# Patient Record
Sex: Female | Born: 1985 | Race: Black or African American | Hispanic: No | Marital: Single | Smoking: Never smoker
Health system: Southern US, Community
[De-identification: ages and names within clinical notes are randomized; demographics above are authoritative.]

## PROBLEM LIST (undated history)

## (undated) ENCOUNTER — Inpatient Hospital Stay (HOSPITAL_COMMUNITY): Payer: Self-pay

## (undated) DIAGNOSIS — F99 Mental disorder, not otherwise specified: Secondary | ICD-10-CM

## (undated) DIAGNOSIS — I10 Essential (primary) hypertension: Secondary | ICD-10-CM

## (undated) DIAGNOSIS — J189 Pneumonia, unspecified organism: Secondary | ICD-10-CM

## (undated) DIAGNOSIS — Z309 Encounter for contraceptive management, unspecified: Secondary | ICD-10-CM

## (undated) DIAGNOSIS — A749 Chlamydial infection, unspecified: Secondary | ICD-10-CM

## (undated) DIAGNOSIS — A599 Trichomoniasis, unspecified: Secondary | ICD-10-CM

## (undated) HISTORY — PX: WISDOM TOOTH EXTRACTION: SHX21

## (undated) HISTORY — PX: MOUTH SURGERY: SHX715

## (undated) HISTORY — PX: INDUCED ABORTION: SHX677

---
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
## 2005-01-16 ENCOUNTER — Emergency Department (HOSPITAL_COMMUNITY): Admission: EM | Admit: 2005-01-16 | Discharge: 2005-01-16 | Payer: Self-pay | Admitting: *Deleted

## 2005-01-22 ENCOUNTER — Ambulatory Visit: Payer: Self-pay | Admitting: Family Medicine

## 2005-03-19 ENCOUNTER — Ambulatory Visit: Payer: Self-pay | Admitting: Family Medicine

## 2005-12-08 ENCOUNTER — Ambulatory Visit: Payer: Self-pay | Admitting: Family Medicine

## 2006-02-11 ENCOUNTER — Ambulatory Visit: Payer: Self-pay | Admitting: Family Medicine

## 2006-03-11 ENCOUNTER — Emergency Department (HOSPITAL_COMMUNITY): Admission: EM | Admit: 2006-03-11 | Discharge: 2006-03-11 | Payer: Self-pay | Admitting: Emergency Medicine

## 2006-03-13 ENCOUNTER — Encounter (INDEPENDENT_AMBULATORY_CARE_PROVIDER_SITE_OTHER): Payer: Self-pay | Admitting: *Deleted

## 2006-03-14 ENCOUNTER — Emergency Department (HOSPITAL_COMMUNITY): Admission: EM | Admit: 2006-03-14 | Discharge: 2006-03-14 | Payer: Self-pay | Admitting: *Deleted

## 2006-04-08 ENCOUNTER — Encounter (INDEPENDENT_AMBULATORY_CARE_PROVIDER_SITE_OTHER): Payer: Self-pay | Admitting: *Deleted

## 2006-04-08 ENCOUNTER — Ambulatory Visit: Payer: Self-pay | Admitting: Sports Medicine

## 2006-04-08 ENCOUNTER — Telehealth (INDEPENDENT_AMBULATORY_CARE_PROVIDER_SITE_OTHER): Payer: Self-pay | Admitting: *Deleted

## 2006-04-08 LAB — CONVERTED CEMR LAB
ALT: 19 units/L (ref 0–35)
AST: 16 units/L (ref 0–37)
Albumin: 4.4 g/dL (ref 3.5–5.2)
Alkaline Phosphatase: 62 units/L (ref 39–117)
Amylase: 49 units/L (ref 0–105)
BUN: 10 mg/dL (ref 6–23)
Beta hcg, urine, semiquantitative: NEGATIVE
CO2: 24 meq/L (ref 19–32)
Calcium: 9.6 mg/dL (ref 8.4–10.5)
Chloride: 106 meq/L (ref 96–112)
Creatinine, Ser: 0.78 mg/dL (ref 0.40–1.20)
Glucose, Bld: 83 mg/dL (ref 70–99)
HCT: 39.8 % (ref 36.0–46.0)
Hemoglobin: 13.4 g/dL (ref 12.0–15.0)
Lipase: <10 units/L (ref 0–75)
MCHC: 33.7 g/dL (ref 30.0–36.0)
MCV: 91.1 fL (ref 78.0–100.0)
Platelets: 268 10*3/uL (ref 150–400)
Potassium: 4 meq/L (ref 3.5–5.3)
RBC: 4.37 M/uL (ref 3.87–5.11)
RDW: 12.6 % (ref 11.5–14.0)
Sodium: 140 meq/L (ref 135–145)
TSH: 0.524 microintl units/mL (ref 0.350–5.50)
Total Bilirubin: 0.4 mg/dL (ref 0.3–1.2)
Total Protein: 7.1 g/dL (ref 6.0–8.3)
WBC: 5.1 10*3/uL (ref 4.0–10.5)

## 2006-05-29 ENCOUNTER — Telehealth: Payer: Self-pay | Admitting: *Deleted

## 2006-05-29 ENCOUNTER — Encounter: Payer: Self-pay | Admitting: *Deleted

## 2006-06-03 ENCOUNTER — Inpatient Hospital Stay (HOSPITAL_COMMUNITY): Admission: AD | Admit: 2006-06-03 | Discharge: 2006-06-03 | Payer: Self-pay | Admitting: Obstetrics & Gynecology

## 2006-06-09 ENCOUNTER — Ambulatory Visit: Payer: Self-pay | Admitting: Physician Assistant

## 2006-06-09 ENCOUNTER — Inpatient Hospital Stay (HOSPITAL_COMMUNITY): Admission: AD | Admit: 2006-06-09 | Discharge: 2006-06-11 | Payer: Self-pay | Admitting: Obstetrics & Gynecology

## 2006-07-07 ENCOUNTER — Telehealth: Payer: Self-pay | Admitting: Family Medicine

## 2006-07-08 ENCOUNTER — Inpatient Hospital Stay (HOSPITAL_COMMUNITY): Admission: AD | Admit: 2006-07-08 | Discharge: 2006-07-08 | Payer: Self-pay | Admitting: Family Medicine

## 2006-07-14 ENCOUNTER — Telehealth (INDEPENDENT_AMBULATORY_CARE_PROVIDER_SITE_OTHER): Payer: Self-pay | Admitting: *Deleted

## 2006-07-25 ENCOUNTER — Inpatient Hospital Stay (HOSPITAL_COMMUNITY): Admission: AD | Admit: 2006-07-25 | Discharge: 2006-07-25 | Payer: Self-pay | Admitting: Obstetrics and Gynecology

## 2006-09-09 ENCOUNTER — Encounter (INDEPENDENT_AMBULATORY_CARE_PROVIDER_SITE_OTHER): Payer: Self-pay | Admitting: Family Medicine

## 2006-09-09 ENCOUNTER — Other Ambulatory Visit: Admission: RE | Admit: 2006-09-09 | Discharge: 2006-09-09 | Payer: Self-pay | Admitting: Family Medicine

## 2006-09-09 ENCOUNTER — Ambulatory Visit: Payer: Self-pay | Admitting: Family Medicine

## 2006-09-09 LAB — CONVERTED CEMR LAB
Beta hcg, urine, semiquantitative: NEGATIVE
Chlamydia, DNA Probe: NEGATIVE
GC Probe Amp, Genital: NEGATIVE
KOH Prep: NEGATIVE
Whiff Test: POSITIVE

## 2006-09-23 ENCOUNTER — Telehealth (INDEPENDENT_AMBULATORY_CARE_PROVIDER_SITE_OTHER): Payer: Self-pay | Admitting: Family Medicine

## 2006-09-30 ENCOUNTER — Telehealth (INDEPENDENT_AMBULATORY_CARE_PROVIDER_SITE_OTHER): Payer: Self-pay | Admitting: *Deleted

## 2006-10-06 ENCOUNTER — Encounter (INDEPENDENT_AMBULATORY_CARE_PROVIDER_SITE_OTHER): Payer: Self-pay | Admitting: *Deleted

## 2006-10-09 ENCOUNTER — Encounter: Payer: Self-pay | Admitting: Family Medicine

## 2006-10-09 ENCOUNTER — Telehealth: Payer: Self-pay | Admitting: *Deleted

## 2006-12-25 ENCOUNTER — Ambulatory Visit: Payer: Self-pay | Admitting: Family Medicine

## 2006-12-25 LAB — CONVERTED CEMR LAB: Beta hcg, urine, semiquantitative: NEGATIVE

## 2007-01-12 ENCOUNTER — Telehealth: Payer: Self-pay | Admitting: *Deleted

## 2007-01-13 ENCOUNTER — Ambulatory Visit: Payer: Self-pay | Admitting: Family Medicine

## 2007-01-13 LAB — CONVERTED CEMR LAB: Rapid Strep: NEGATIVE

## 2007-02-22 ENCOUNTER — Emergency Department (HOSPITAL_COMMUNITY): Admission: EM | Admit: 2007-02-22 | Discharge: 2007-02-22 | Payer: Self-pay | Admitting: Emergency Medicine

## 2007-02-22 ENCOUNTER — Telehealth: Payer: Self-pay | Admitting: *Deleted

## 2007-02-24 ENCOUNTER — Telehealth: Payer: Self-pay | Admitting: *Deleted

## 2007-02-26 ENCOUNTER — Encounter: Payer: Self-pay | Admitting: *Deleted

## 2007-03-01 ENCOUNTER — Encounter: Payer: Self-pay | Admitting: Family Medicine

## 2007-03-18 ENCOUNTER — Ambulatory Visit: Payer: Self-pay | Admitting: Family Medicine

## 2007-03-18 DIAGNOSIS — F319 Bipolar disorder, unspecified: Secondary | ICD-10-CM | POA: Insufficient documentation

## 2007-03-23 ENCOUNTER — Ambulatory Visit: Payer: Self-pay | Admitting: Family Medicine

## 2007-03-25 ENCOUNTER — Ambulatory Visit: Payer: Self-pay | Admitting: Sports Medicine

## 2007-03-31 ENCOUNTER — Ambulatory Visit: Payer: Self-pay | Admitting: Psychology

## 2007-04-14 ENCOUNTER — Ambulatory Visit: Payer: Self-pay | Admitting: Family Medicine

## 2007-07-08 ENCOUNTER — Encounter: Payer: Self-pay | Admitting: *Deleted

## 2007-07-21 ENCOUNTER — Encounter (INDEPENDENT_AMBULATORY_CARE_PROVIDER_SITE_OTHER): Payer: Self-pay | Admitting: *Deleted

## 2007-10-11 ENCOUNTER — Telehealth: Payer: Self-pay | Admitting: *Deleted

## 2007-10-13 ENCOUNTER — Ambulatory Visit: Payer: Self-pay | Admitting: Family Medicine

## 2007-10-13 LAB — CONVERTED CEMR LAB: Beta hcg, urine, semiquantitative: NEGATIVE

## 2007-10-26 ENCOUNTER — Ambulatory Visit: Payer: Self-pay | Admitting: Family Medicine

## 2007-10-26 LAB — CONVERTED CEMR LAB: Beta hcg, urine, semiquantitative: NEGATIVE

## 2007-10-27 ENCOUNTER — Telehealth: Payer: Self-pay | Admitting: *Deleted

## 2008-01-14 DIAGNOSIS — F99 Mental disorder, not otherwise specified: Secondary | ICD-10-CM

## 2008-01-14 DIAGNOSIS — F319 Bipolar disorder, unspecified: Secondary | ICD-10-CM

## 2008-01-14 HISTORY — DX: Mental disorder, not otherwise specified: F99

## 2008-01-14 HISTORY — DX: Bipolar disorder, unspecified: F31.9

## 2008-01-27 ENCOUNTER — Ambulatory Visit: Payer: Self-pay | Admitting: Family Medicine

## 2008-01-27 ENCOUNTER — Encounter: Payer: Self-pay | Admitting: Family Medicine

## 2008-01-27 DIAGNOSIS — L219 Seborrheic dermatitis, unspecified: Secondary | ICD-10-CM | POA: Insufficient documentation

## 2008-06-16 ENCOUNTER — Ambulatory Visit: Payer: Self-pay | Admitting: Family Medicine

## 2008-06-16 ENCOUNTER — Encounter: Payer: Self-pay | Admitting: Family Medicine

## 2008-06-16 ENCOUNTER — Other Ambulatory Visit: Admission: RE | Admit: 2008-06-16 | Discharge: 2008-06-16 | Payer: Self-pay | Admitting: Family Medicine

## 2008-06-16 ENCOUNTER — Encounter: Payer: Self-pay | Admitting: *Deleted

## 2008-06-16 LAB — CONVERTED CEMR LAB
Beta hcg, urine, semiquantitative: NEGATIVE
Whiff Test: POSITIVE

## 2008-06-20 LAB — CONVERTED CEMR LAB
Chlamydia, DNA Probe: POSITIVE — AB
GC Probe Amp, Genital: NEGATIVE

## 2008-07-06 ENCOUNTER — Encounter: Payer: Self-pay | Admitting: *Deleted

## 2008-07-14 ENCOUNTER — Ambulatory Visit: Payer: Self-pay | Admitting: Family Medicine

## 2008-07-14 LAB — CONVERTED CEMR LAB: Beta hcg, urine, semiquantitative: NEGATIVE

## 2008-10-27 ENCOUNTER — Emergency Department (HOSPITAL_COMMUNITY): Admission: EM | Admit: 2008-10-27 | Discharge: 2008-10-27 | Payer: Self-pay | Admitting: Emergency Medicine

## 2009-01-15 ENCOUNTER — Encounter (INDEPENDENT_AMBULATORY_CARE_PROVIDER_SITE_OTHER): Payer: Self-pay | Admitting: *Deleted

## 2009-01-15 DIAGNOSIS — F17200 Nicotine dependence, unspecified, uncomplicated: Secondary | ICD-10-CM | POA: Insufficient documentation

## 2009-06-06 ENCOUNTER — Telehealth: Payer: Self-pay | Admitting: Family Medicine

## 2009-06-06 ENCOUNTER — Ambulatory Visit: Payer: Self-pay | Admitting: Family Medicine

## 2009-06-06 LAB — CONVERTED CEMR LAB
Beta hcg, urine, semiquantitative: NEGATIVE
Bilirubin Urine: NEGATIVE
Glucose, Urine, Semiquant: NEGATIVE
Ketones, urine, test strip: NEGATIVE
Nitrite: NEGATIVE
Protein, U semiquant: 100
Specific Gravity, Urine: 1.02
Urobilinogen, UA: 0.2
WBC Urine, dipstick: NEGATIVE
pH: 7

## 2009-06-14 ENCOUNTER — Telehealth: Payer: Self-pay | Admitting: Family Medicine

## 2009-07-23 ENCOUNTER — Emergency Department (HOSPITAL_COMMUNITY): Admission: EM | Admit: 2009-07-23 | Discharge: 2009-07-23 | Payer: Self-pay | Admitting: Family Medicine

## 2009-11-09 ENCOUNTER — Emergency Department (HOSPITAL_COMMUNITY): Admission: EM | Admit: 2009-11-09 | Discharge: 2009-11-09 | Payer: Self-pay | Admitting: Family Medicine

## 2009-11-22 ENCOUNTER — Encounter: Payer: Self-pay | Admitting: Family Medicine

## 2010-02-12 NOTE — Assessment & Plan Note (Signed)
Summary: DNKA   DPG   

## 2010-02-12 NOTE — Assessment & Plan Note (Signed)
Summary: throwing up/diarrhea wp   Vital Signs:  Patient Profile:   25 Years Old Female Height:     61 inches Weight:      123.1 pounds Temp:     98.8 degrees F Pulse rate:   94 / minute BP sitting:   99 / 73  Vitals Entered By: Jone Baseman CMA (December 25, 2006 11:13 AM)                 PCP:  Luretha Murphy NP  Chief Complaint:  vomitting x 1 day.  History of Present Illness: Presents with 2 yo son for NBNB emesis x 1 day.  States son with same symptoms for 3 days then it spread to her.  She denies fever.  State no blood in vomit or stool.  States some crampy abdominal pain noted. LMP unknown missed last Depo.    Current Allergies: ! PENICILLIN      Physical Exam  General:     Well-developed,well-nourished,in no acute distress; alert,appropriate and cooperative throughout examination Head:     Normocephalic and atraumatic without obvious abnormalities. No apparent alopecia or balding. Eyes:     No corneal or conjunctival inflammation noted. EOMI. Perrla. Funduscopic exam benign, without hemorrhages, exudates or papilledema. Vision grossly normal. Ears:     External ear exam shows no significant lesions or deformities.  Otoscopic examination reveals clear canals, tympanic membranes are intact bilaterally without bulging, retraction, inflammation or discharge. Hearing is grossly normal bilaterally. Nose:     External nasal examination shows no deformity or inflammation. Nasal mucosa are pink and moist without lesions or exudates. Mouth:     Oral mucosa and oropharynx without lesions or exudates.  Teeth in good repair. Lungs:     Normal respiratory effort, chest expands symmetrically. Lungs are clear to auscultation, no crackles or wheezes. Heart:     Normal rate and regular rhythm. S1 and S2 normal without gallop, murmur, click, rub or other extra sounds. Abdomen:     Bowel sounds positive,abdomen soft and non-tender without masses, organomegaly or hernias  noted.    Impression & Recommendations:  Problem # 1:  GASTROENTERITIS WITHOUT DEHYDRATION (ICD-558.9) Assessment: Deteriorated No sxs of acute abdomen etc.  Gave specific warning signs for fever, worse abd pain keep in mind appy.  keep hydrated with gatorade.  BRAT diet explained.  upreg neagtive will catch up on Depo.   Orders: [facility]- Est Level  3 (84696)   Complete Medication List: 1)  Flagyl 500 Mg Tabs (Metronidazole) .... One tab by mouth two times a day x 1 week  Other Orders: U Preg-[facility] (81025) U Preg-[facility] (29528) Depo-Provera 150mg  (U1324)   Patient Instructions: 1)  Follow up with regular physician ( Dr. Essie Christine ) in 3-5 days to assure you are getting better. 2)  This is likely a stomach virus.  It should improve with time. 3)  Keep well hydrated with pedialyte and gatorade. 4)  Avoid milk and juice this could make it worse. 5)  Start BRAT diet 6)  Bananas 7)  Rice  8)  Applesauce 9)  Toast. 10)  If no improvement, decreased urine out put, change in behavior of blood in stool or vomit.  Report to urgent care or emergency department over the week end.      ] Laboratory Results   Urine Tests  Date/Time Received: December 25, 2006 11:56 AM  Date/Time Reported: December 25, 2006 12:06 PM     Urine HCG: negative Comments: TEST PERFORMED BY  JESSICA FLEEGER, CMA...................................................................[facility]  December 25, 2006 12:05 PM       Medication Administration  Injection # 1:    Medication: Depo-Provera 150mg     Diagnosis: CONTRACEPTIVE MANAGEMENT NOS (ICD-V25.9)    Route: IM    Site: RUOQ gluteus    Exp Date: 04/13/2009    Lot #: [id number]    Mfr: [email or web address]    Comments: Pt informed to RTC Feb 27- March 13    Patient tolerated injection without complications    Given by: Jone Baseman CMA (December 25, 2006 12:09 PM)  Orders Added: 1)  [facility]- Est Level  3 [99213] 2)  U Preg-[facility] [81025] 3)  U Preg-[facility] [81025] 4)   Depo-Provera 150mg  [J1055]

## 2010-02-12 NOTE — Miscellaneous (Signed)
Summary: PT MISSED APPT FOR TX/TS  Clinical Lists Changes called pt. both numbers are invalid. mailed letter. pt needs tx and missed appt 06-30-08 for tx .Arlyss Repress CMA,  July 06, 2008 9:29 AM

## 2010-02-12 NOTE — Assessment & Plan Note (Signed)
Summary: READ PPD/KH  Nurse Visit    Current Allergies: ! PENICILLIN   PPD Results    Date of reading: 03/25/2007    Results: < 5mm    Interpretation: negative   Orders Added: 1)  Est Level 1- [facility] [04540]    ]

## 2010-02-12 NOTE — Miscellaneous (Signed)
Summary: DNKA- Reaction to meds  Clinical Lists Changes

## 2010-02-12 NOTE — Assessment & Plan Note (Signed)
Summary: Initial MDC appointment    PCP:  Luretha Murphy NP   History of Present Illness: Patient presents with case manager Key and patient's boyfriend Aurther Loft.  Key provides the initial history.  States that she was prescribed medications (abilify and Lexapro) but did not like the side effects.  She stopped taking the medication and her behavor became erratic.  She also described substance abuse treatment and in-house treatment for this and her mood issues.  The case worker is concerned about many things including the impact the patient's mood issues have on the patient's two year old son.    Patient reports regular marijuana use up until a month ago (her case worker says a few weeks ago).  She denies the use of other substances except alcohol.  She reports she drinks socially but describes drinking to the point of intoxication about two weeks ago.  Patient reports her mother has a diagnosis of bipolar disorder with history of hospitalization.  She says her older brother has AD/HD and her younger brother is without psychiatric issues.    Patient denies decreased need for sleep.  Admits to having moods that shift regularly.  She denies suicidal ideation.  She says mostly her anger is directed toward others through yelling and screaming.  Aurther Loft denies that she is violent on a regular basis.  He reported that he does not feel unsafe with her nor does he have concerns for her safety.  Patient reported taking Abilify 10 mg at night made her sleepy during the day.  She said the Lexapro sped her up and made it difficult to go to sleep.  Educational level and work history is not well known.    Current Allergies: ! PENICILLIN        Impression & Recommendations:  Problem # 1:  BIPOLAR AFFECTIVE DISORDER (ICD-296.80) Patient seemed blunted.  She smirked occasionally - especially in response to the reports of her case worker and boyfriend.  Unclear how seriously she is taking this.  Level of  distress does not seem consistent with the negative consequences she has experienced lately - due to her mood and behavior issues.  Insight is limited.  Cognitive level is unknown but might be a factor.  Denies suicidal ideation.  Discussed the idea of being clean and sober as a necessary component to effective treatment.  She does not believe this will be a problem.  Discussed the idea that treatment is a journey and that there is not likely to be any easy answers.  She is concerned about becoming a zombie or losing her personality.    Patient report of symptoms is consistent with Bipolar Disorder with rapid cycling and / or mixed state.    Zyprexa was recommended because of how quickly it tends to work.  We discussed the need for periodic blood monitoring.  Discussed starting at 5 mg a day with an increase to 10 mg in a few days.  Discussed signs that things are getting better.  Patient reported less mood lability and decreased anger.  Patient also mentioned a desire to be able to focus more effectively.  Side effects including day time drowsiness was discussed.  Her son is not in the house right now and so daytime somnolence is less of an issue.   [phone])   Medications Added to Medication List This Visit: 1)  Zyprexa 5 Mg Tabs (Olanzapine) .... Take 5 mg a night for three nights.  after that, take 10  mg a night.  Complete Medication List: 1)  Zyprexa 5 Mg Tabs (Olanzapine) .... Take 5 mg a night for three nights.  after that, take 10 mg a night.   Patient Instructions: 1)  Take 5 mg each night for 3 nights and then increase to 10 mg at night (between 8 - 9 pm should be good to avoid daytime sleepiness). 2)  Please call [phone] with any questions or concerns.    Prescriptions: ZYPREXA 5 MG  TABS (OLANZAPINE) Take 5 mg a night for three nights.  After that, take 10 mg a night.  #30 x 0   Entered and Authorized by:   Spero Geralds PsyD   Signed by:    Spero Geralds PsyD on 03/31/2007   Method used:   Handwritten   RxID:   [id number]  ]

## 2010-02-12 NOTE — Assessment & Plan Note (Signed)
Summary: MDC follow-up    PCP:  Luretha Murphy NP   History of Present Illness: Patient reports sedation on Zyprexa that she could not tolerate.  She noticed no benefit.  She had an increased appetite on it.  Aurther Loft, her boyfriend, had the same observations.  Patient reports interest in trying a different medication as long as it is helpful.  We discussed options including Abilify without Lexapro (she was on that before with Lexapro).  She did not like that option because she thinks the Abilify caused her trouble last time.  We discussed Lithium and Depakote.    In the last two weeks, patient reports no days of noticeably depressed mood.  She reports being more "alert" to things over the last two weeks - able to better focus.      Current Allergies: ! PENICILLIN        Impression & Recommendations:  Problem # 1:  BIPOLAR AFFECTIVE DISORDER (ICD-296.80) Patient appears more alert and engaged.  Affect within normal limits today.  Thoughts are clear and goal directed.  Noticeable difference in ability to focus.    Dr. Kathrynn Running recommended Depakote with a slow titration starting at 250 mg.  Side effects were discussed includling stomach upset.  Patient is on Depo Provera for birth control and gets the shot regularly.  Patient agrees wtih treatment plan.  Orders: Therapy 20-30 min- [facility] (04540)   Complete Medication List: 1)  Depakote Er 250 Mg Tb24 (Divalproex sodium) .... Take one at night for five nights; then two at night for five nights and then three a night.  take with food.   Patient Instructions: 1)  Please schedule a [facility] appointment for:  April 15th at 2:00. 2)  For the first five days, take 250 mg of Depakote with your evening meal.  Increase to two pills (500 mg) for the next five days and then to three pills (750 mg). 3)  Please call [phone] with questions or concerns. 4)  You can go to [email or web address] if you would like further  information.    Prescriptions: DEPAKOTE ER 250 MG  TB24 (DIVALPROEX SODIUM) Take one at night for five nights; then two at night for five nights and then three a night.  Take with food.  #60 x 0   Entered and Authorized by:   Spero Geralds PsyD   Signed by:   Spero Geralds PsyD on 04/14/2007   Method used:   Handwritten   RxID:   [id number]  ]

## 2010-02-12 NOTE — Assessment & Plan Note (Signed)
Summary: STD tx,df  Nurse Visit     Allergies: 1)  ! Penicillin  Laboratory Results   Urine Tests  Date/Time Received: July 14, 2008 10:30 AM  Date/Time Reported: July 14, 2008 10:38 AM     Urine HCG: negative Comments: Jacki Cones RN  July 14, 2008 10:38 AM       Medication Administration  Injection # 1:    Medication: Depo-Provera 150mg     Diagnosis: CONTRACEPTIVE MANAGEMENT NOS (ICD-V25.9)    Route: IM    Site: LUOQ gluteus    Exp Date: 10/2010    Lot #: [id number]    Mfr: [email or web address]    Comments: Pt states she had unprotected sex last night.  Advised today's pregnancy test is not conclusive, offered emergency contraception - pt declined.  She wants depo today.  Advised her she needs to return to clinic 07/28/08 for repeat pregnancy test to ensure that she is not pregnant.  Also advised her to use condoms for next 7 days as the depo may not be fully effective during that time, and always use condoms for safer sex practices.     Patient tolerated injection without complications    Given by: Jacki Cones RN (July 14, 2008 12:16 PM)  Medication # 1:    Medication: Azithromycin oral    Diagnosis: CHLAMYDTRACHOMATIS INFECTION LOWER GU SITES (ICD-099.53)    Dose: 1 gram    Route: po    Exp Date: 03/13/2009    Lot #: [id number]    Mfr: [email or web address]    Comments: Advised pt to make sure her partner is notified and receives treatment, and to abstain from sex for at least 7 days after they both receive treatment.  Advised her to ALWAYS use condoms for safer sex practices.    Patient tolerated medication without complications    Given by: Jacki Cones RN (July 14, 2008 12:18 PM)  Orders Added: 1)  U Preg-[facility] [81025] 2)  Depo-Provera 150mg  [J1055] 3)  Azithromycin oral [Q0144] 4)  Est Level 1- [facility] [70623]       Medication Administration  Injection # 1:    Medication: Depo-Provera 150mg     Diagnosis: CONTRACEPTIVE MANAGEMENT NOS (ICD-V25.9)    Route: IM    Site: LUOQ  gluteus    Exp Date: 10/2010    Lot #: [id number]    Mfr: [email or web address]    Comments: Pt states she had unprotected sex last night.  Advised today's pregnancy test is not conclusive, offered emergency contraception - pt declined.  She wants depo today.  Advised her she needs to return to clinic 07/28/08 for repeat pregnancy test to ensure that she is not pregnant.  Also advised her to use condoms for next 7 days as the depo may not be fully effective during that time, and always use condoms for safer sex practices.     Patient tolerated injection without complications    Given by: Jacki Cones RN (July 14, 2008 12:16 PM)  Medication # 1:    Medication: Azithromycin oral    Diagnosis: CHLAMYDTRACHOMATIS INFECTION LOWER GU SITES (ICD-099.53)    Dose: 1 gram    Route: po    Exp Date: 03/13/2009    Lot #: [id number]    Mfr: [email or web address]    Comments: Advised pt to make sure her partner is notified and receives treatment, and to abstain from sex for at least 7 days after they both receive treatment.  Advised her to ALWAYS use condoms  for safer sex practices.    Patient tolerated medication without complications    Given by: Jacki Cones RN (July 14, 2008 12:18 PM)  Orders Added: 1)  U Preg-[facility] [81025] 2)  Depo-Provera 150mg  [J1055] 3)  Azithromycin oral [Q0144] 4)  Est Level 1- [facility] [10272]   Appended Document: STD tx,df Pt advised to return to clinic for next depo 09/29/08 - 10/13/08

## 2010-02-12 NOTE — Assessment & Plan Note (Signed)
Summary: resume depo/el   Vital Signs:  Patient Profile:   25 Years Old Female Height:     61 inches Weight:      120 pounds Temp:     98.3 degrees F Pulse rate:   53 / minute BP sitting:   105 / 64  Pt. in pain?   no  Vitals Entered By: Jone Baseman CMA (September 09, 2006 3:47 PM)                Chief Complaint:  resume depo.  History of Present Illness: S: Patient is a 25 y/o female G2P1010 who presents today to discuss contraception.  Patient had a TAB by [facility] & E about 6 weeks ago. Her IUD had been removed in 2/08 at [facility].  Her LMP was at the end of July (does not remember exact date) .  She is sexually active and she  has had recent unprotected intercourse.  She does not use condoms because she is allergic to latex.  She has had multiple sexually transmitted infections in the past. Patient states she cannot remember to take a pill dailyand she does not want patch because she got pregnant on it.    She has been on Depo in the past and would like to try it again. patient is also having some irregular spotting since she had the D&E 2. facial rash-  Patient has had intermittant facial redness over her nose nad cheeks that burns and sometimes scales. She has tried OTC cream such as Noxema that help some  Current Allergies: ! 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  Attends Grade 12. No smoking nor alcohol use   Risk Factors:  Tobacco use:  current  Cigarettes:  Yes -- 1/4 pack(s) per day    Physical Exam  General:     Well-developed,well-nourished,in no acute distress; alert,appropriate and cooperative throughout examination Genitalia:     Normal introitus for age, no external lesions, whitish watery vaginal discharge with fishy odor, mucosa pink and moist, no vaginal or cervical lesions, no vaginal atrophy, no friaility or hemorrhage, normal uterus size and position, no adnexal masses or tenderness Skin:     peeling skin on forehead with hypopigmented areas  erythematous nose and cheeks    Impression & Recommendations:  Problem # 1:  FACIAL RASH (ICD-782.1) Assessment: New Appears to be seborrheic dermatitis but not for sure. Refering patient to derm Orders: [facility]- Est  Level 4 (82956)   Problem # 2:  CONTRACEPTIVE MANAGEMENT NOS (ICD-V25.9) Assessment: New U preg negative will start on depo.  Patient will return in 3 months for next one Orders: Depo-Provera 150mg  (J1055) [facility]- Est  Level 4 (21308)   Problem #  3:  HIGH-RISK SEXUAL BEHAVIOR (ICD-V69.2) Assessment: Deteriorated Patient has BV infection.  Gave script for Flagyl 500 mg by mouth two times a day for 1 week.  Patient encouraged to buy lambswool condoms and use them everytime.  Orders: GC/Chlamydia-[facility] (87591/87491) Wet Prep- [facility] (04540) [facility]- Est  Level 4 (98119)   Complete Medication List: 1)  Flagyl 500 Mg Tabs (Metronidazole) .... One tab by mouth two times a day x 1 week  Other Orders: U Preg-[facility] (14782) Pap Smear-[facility] (95621-30865)     Prescriptions: FLAGYL 500 MG  TABS (METRONIDAZOLE) one tab by mouth two times a day x 1 week  #14 x 0   Entered and Authorized by:   Altamese Cabal MD   Signed by:   Altamese Cabal MD on 09/09/2006   Method used:   Handwritten   RxID:   [id number]    Medication Administration  Injection # 1:    Medication: Depo-Provera 150mg     Diagnosis: CONTRACEPTIVE MANAGEMENT NOS (ICD-V25.9)    Route: IM     Site: LUOQ gluteus    Exp Date: 11/13/2008    Lot #: [id number]    Mfr: [email or web address]    Comments: Pt informed to RTC 11.12-11.26    Patient tolerated injection without complications    Given by: Jone Baseman CMA (September 09, 2006 4:41 PM)  Orders Added: 1)  U Preg-[facility] [81025] 2)  GC/Chlamydia-[facility] [87591/87491] 3)  Wet Prep- [facility] [25366] 4)  Pap Smear-[facility] [44034-74259] 5)  Depo-Provera 150mg  [J1055] 6)  [facility]- Est  Level 4 [56387]   Laboratory Results   Urine Tests  Date/Time Recieved: September 09, 2006 4:27 PM  Date/Time Reported: September 09, 2006 4:48 PM     Urine HCG: negative Comments: ..............test performed by.................Marland KitchenJone Baseman, CMA  Date/Time Received: September 09, 2006 4:27  PM  Date/Time Reported: September 09, 2006 4:46 PM   Wet [place]/KOH Source: vag WBC/hpf 10-20 Bacteria/hpf 3+  Cocci Clue cells/hpf many  Positive whiff Yeast/hpf none KOH Negative Trichomonas/hpf none Comments ...............test performed by......Marland KitchenBonnie A. [place], [place] (ASCP)

## 2010-02-12 NOTE — Progress Notes (Signed)
Summary: triage  Phone Note Call from Patient Call back at [phone]   Caller: Patient Summary of Call: pt is having back pain and wants to come in today Initial call taken by: De Nurse,  Jun 06, 2009 11:28 AM  Follow-up for Phone Call        states she has a hx of kidney problems. did not get meds last rx'd for her vaginal problems. pain is 7/10. pain is low back on R side. work iin at [employer]. aware of wait. has not taken any otc for the pain as she does not like to take meds. message to K. Malen Gauze, Charity fundraiser. Chiropodist re: multiple dnkas. Follow-up by: Golden Circle RN,  Jun 06, 2009 11:38 AM

## 2010-02-12 NOTE — Progress Notes (Signed)
Summary: New ob  Phone Note Call from Patient Call back at [phone]   Reason for Call: Talk to Nurse Summary of Call: pt is requesting an appt as a new ob pt, she is aprox. 13 weeks Initial call taken by: ERIN LEVAN,  July 07, 2006 3:02 PM  Follow-up for Phone Call        informed pt of appts Follow-up by: Haydee Salter,  July 10, 2006 11:53 AM   APPT SCHEDULED July 16, 2006 @ 2:30 FOR LAB APPT SCHEDULED July 23, 2006 @ 2:45 WITH DR. Mannie Stabile. I HAVE NOT BEEN ABLE TO GET IN TOUCH WITH PATIENT TO GIVE THESE APPT TIMES TO HER. WILL WAIT FOR HER TO CONTACT us. HAS NO HOME # AND CALL BACK NUMBER IS A [employer]WORKERS CELL PHONE.  Terese Door, CMA

## 2010-02-12 NOTE — Progress Notes (Signed)
Summary: Referral to dermatologist  Phone Note Call from Patient Call back at [phone]   Reason for Call: Referral Summary of Call: Pt checking status of referral to a dermatologist.  She states she was told this would be done at her last appt on 8/27. Initial call taken by: Haydee Salter,  September 23, 2006 11:47 AM  Follow-up for Phone Call        Scheduled pt for appt with Dr. Elliot Gurney 10/21/06 at 4pm.  Their address is [address]  Phone call to pt to notify - no answer, will try again later.  Follow-up by: AMY MARTIN RN,  September 23, 2006 12:07 PM  Additional Follow-up for Phone Call Additional follow up Details #1::        Spoke with pt- she states she is unable to write down her appt time and date.  Mailed pt a letter with this info.  Additional Follow-up by: AMY MARTIN RN,  September 23, 2006 12:37 PM

## 2010-02-12 NOTE — Miscellaneous (Signed)
Summary: Directives and Consents  Directives and Consents   Imported By: Denny Peon LEVAN 10/09/2006 08:55:22  _____________________________________________________________________  External Attachment:    Type:   Image     Comment:   External Document

## 2010-02-12 NOTE — Letter (Signed)
Summary: Generic Letter  [facility]  [address]   [place], [place] [address]   Phone: [phone]  Fax: [phone]    01/27/2008  [facility] [address] [place], [place]  [address]  To Whom It May Concern:  Please be advised that the above-named patient has a common condition called seborrheic dermatitis. It is not contagious and she should be allowed to work at full capacity. Please call our office with questions.  Sincerely,   Myrtie Soman  MD [facility]

## 2010-02-12 NOTE — Assessment & Plan Note (Signed)
Summary: DNKA      Current Allergies: ! PENICILLIN        Complete Medication List: 1)  Depakote Er 250 Mg Tb24 (Divalproex sodium) .... Take one at night for five nights; then two at night for five nights and then three a night.  take with food.    ]

## 2010-02-12 NOTE — Assessment & Plan Note (Signed)
Summary: LOW back pain-see notes/[place]/saxon   Vital Signs:  Patient profile:   25 year old female Height:      61 inches Weight:      128 pounds BMI:     24.27 BP sitting:   102 / 78  (left arm) Cuff size:   regular  Vitals Entered By: Tessie Fass CMA (Jun 06, 2009 1:44 PM)  CC: lower back pain x 2 weeks Pain Assessment Patient in pain? yes     Location: lower back Intensity: 3   Primary Care Provider:  Luretha Murphy NP  CC:  lower back pain x 2 weeks.  History of Present Illness: Patient come in for right-sided low back pain.  Off and on for 3-4 months but worse lately.  SHarp, achey feeling.  Worse when laying down a while or moving certain ways.  Better when up and walking around.  No fevers.  No abdominal pain.  Thinks it is related to not taking medicine for BV back in June.  Was on depo-provera but last shot was July.  Having irregular menses.  Does not use protection during intercourse.  Last intercourse 1 week ago.  WAnts back on depo.  Last pap 06/2008.  No fevers.  Does endorse "cloudy" urine, strong odor, and icrease in frequency.  No pain with urination.  No vaginal discharge though is on menses.    Allergies: 1)  ! Penicillin  Physical Exam  General:  overweight, alert, NAD vitals reviewed Abdomen:  soft, nontender, nondistedned  back: TTP over right paraspinals in lumbar region. neg SLR FROM No bony tenderness   Impression & Recommendations:  Problem # 1:  LOW BACK PAIN, MILD (ICD-724.2) Assessment New LIkely MSK given exam.  No UTI.  Do not feel that BV/trich would be causing her pain.  Flexeril, heating pad, and stretching.   Her updated medication list for this problem includes:    Flexeril 5 Mg Tabs (Cyclobenzaprine hcl) .Marland Kitchen... 1 tab by mouth q 6 hrs as needed back pain or spasm  Orders: Urinalysis-[facility] (00000) [facility]- Est  Level 4 (78295)  Problem # 2:  VAGINITIS, BACTERIAL (ICD-616.10) Assessment: New  UA shows many clue cells.  Will treat for  BV with flagyl two times a day for 7 days.  Her updated medication list for this problem includes:    Metronidazole 500 Mg Tabs (Metronidazole) .Marland Kitchen..Marland Kitchen Two times a day for 7 days  Orders: [facility]- Est  Level 4 (62130)  Problem # 3:  TRICHOMONAL INFECTION (ICD-131.9) Assessment: New  Leptotrix in urine signifies trich infection.  Spoke with lab - trich seen but were not motile.  Either way, the flagyl for the BV should treat the trich.  Told her that her sexual partners should be treated.   Orders: [facility]- Est  Level 4 (86578)  Problem # 4:  CONTRACEPTIVE MANAGEMENT NOS (ICD-V25.9) Assessment: Unchanged Wanted to go back on depo but left before shot was given.  Called patient to inform her of this.  She did not answer.  LM to call back.  Due for pap in June.  Orders: U Preg-[facility] (46962)  Complete Medication List: 1)  Metronidazole 500 Mg Tabs (Metronidazole) .... Two times a day for 7 days 2)  Flexeril 5 Mg Tabs (Cyclobenzaprine hcl) .Marland Kitchen.. 1 tab by mouth q 6 hrs as needed back pain or spasm  Patient Instructions: 1)  You have bacterial vaginosis and trichomonas (trich).  Ivery Quale is a sexually transmitted disease and any sexual partners of yours  should be informed to go to their physician or the health department to get treatment.   2)  Use a heating pad, stretches, and the flexeril for your back.  3)  Schedule your next pap smear for sometime in mid to late June (after 06/22/09). Prescriptions: METRONIDAZOLE 500 MG TABS (METRONIDAZOLE) two times a day for 7 days Brand medically necessary #14 x 0   Entered and Authorized by:   Ardeen Garland  MD   Signed by:   Ardeen Garland  MD on 06/06/2009   Method used:   Print then Give to Patient   RxID:   [id number] FLEXERIL 5 MG TABS (CYCLOBENZAPRINE HCL) 1 tab by mouth q 6 hrs as needed back pain or spasm  #30 x 1   Entered and Authorized by:   Ardeen Garland  MD   Signed by:   Ardeen Garland  MD on 06/06/2009   Method used:   Print then Give to  Patient   RxID:   [id number]   Laboratory Results   Urine Tests  Date/Time Received: Jun 06, 2009 2:06 PM  Date/Time Reported: Jun 06, 2009 2:35 PM   Routine Urinalysis   Color: yellow Appearance: Clear Glucose: negative   (Normal Range: Negative) Bilirubin: negative   (Normal Range: Negative) Ketone: negative   (Normal Range: Negative) Spec. Gravity: 1.020   (Normal Range: 1.003-1.035) Blood: large   (Normal Range: Negative) pH: 7.0   (Normal Range: 5.0-8.0) Protein: 100   (Normal Range: Negative) Urobilinogen: 0.2   (Normal Range: 0-1) Nitrite: negative   (Normal Range: Negative) Leukocyte Esterace: negative   (Normal Range: Negative)  Urine Microscopic WBC/HPF: 1-5 RBC/HPF: 0-2 Bacteria/HPF: 2+ cocci Epithelial/HPF: 5-10 with mod no. of clue cells Other: leptothrix    Urine HCG: negative Comments: ...............test performed by......Marland KitchenBonnie A. [place], MLS (ASCP)cm

## 2010-02-12 NOTE — Progress Notes (Signed)
Summary: TB test  Phone Note Call from Patient Call back at [phone]   Summary of Call: Pt would like to know when she had her last TB test. Initial call taken by: Haydee Salter,  September 30, 2006 9:04 AM  Follow-up for Phone Call        [phone] WAS WRONG NUMBER AND OTHER NUMBER NO ONE ANSWERED. VIEWED PTS CHART AND NO NOTED TB TEST IN CHART Follow-up by: Lillia Pauls CMA,  September 30, 2006 12:07 PM

## 2010-02-12 NOTE — Assessment & Plan Note (Signed)
Summary: tb test/kh  Nurse Visit    Current Allergies: ! PENICILLIN   PPD Application    Vaccine Type: PPD    Site: right forearm    Mfr: [employer] P[employer]    Dose: 0.1 ml    Route: ID    Given by: Tomasa Rand    Exp. Date: 03/31/2009    Lot #: [id number] Pt  instructed to return 48-72 hours for PPD reading. Pt verbalized understanding.  Orders Added: 1)  TB Skin Test [86580] 2)  Est Level 1- [facility] [[address]]    ]

## 2010-02-12 NOTE — Assessment & Plan Note (Signed)
Summary: skin breakout/kh   Vital Signs:  Patient Profile:   25 Years Old Female Height:     61 inches Weight:      127.9 pounds Temp:     98.4 degrees F Pulse rate:   86 / minute BP sitting:   110 / 76  (left arm)  Pt. in pain?   no  Vitals Entered By: Jacki Cones RN (January 27, 2008 8:32 AM)                  PCP:  Luretha Murphy NP  Chief Complaint:  dry skin and redness on face.  History of Present Illness: 25 yo patient reports for evaluation of rash on face.  States she's had this problem all her life. Primarily consists of redness and dry skin around the nares and on the forehead. Mineral oil helps. Uses noxema for "break-outs" but not every day. Her work sends her here today to "find out what it is and see if it's contagious." Needs a letter to this end.    Current Allergies: ! PENICILLIN    Risk Factors:     Physical Exam  General:     alert, well-developed, well-nourished, and well-hydrated.   Skin:     Good turgor. Face has a moderate amount of make-up applied this morning. Mild erythema around the nares with minimal evidence of dry skin. Forehead is clear. No acne pustules or blackheads to speak of.  Psych:     Oriented X3, normally interactive, good eye contact, not anxious appearing, and not depressed appearing.      Impression & Recommendations:  Problem # 1:  SEBORRHEIC DERMATITIS (ICD-690.10) Assessment: New Nizoral cream and hydrocortisone as needed for redness. Limit drying agents and use quality facial moisturizer daily. Orders: [facility]- Est Level  3 (81191)   Complete Medication List: 1)  Depakote Er 250 Mg Tb24 (Divalproex sodium) .... Take one at night for five nights; then two at night for five nights and then three a night.  take with food. 2)  Ketoconazole 2 % Crea (Ketoconazole) .... Rub gently onto affected area once daily; disp 30 g container   Patient Instructions: 1)  You have seborrheic dermatitis. This is a common problem  that usually responds well to treatment. 2)  Use the ketoconazole cream once daily on the affected areas. 3)  Use over-the-counter hydrocortisone cream at night for redness. Stop this when the redness clears. 4)  Use neutrogena or other facial moisturizers to help with flaking. 5)  Limit products with benzoyl peroxide to only as needed to help with drying of the skin.   Prescriptions: KETOCONAZOLE 2 % CREA (KETOCONAZOLE) rub gently onto affected area once daily; disp 30 g container  #1 x 1   Entered and Authorized by:   Myrtie Soman  MD   Signed by:   Myrtie Soman  MD on 01/27/2008   Method used:   Electronically to        [facility] [address] (retail)       [address]       [place], [place]  [address]       Ph: [phone]       Fax: [phone]   RxID:   [id number]

## 2010-02-12 NOTE — Progress Notes (Signed)
Summary: wi request  Phone Note Call from Patient Call back at Home Phone [phone]   Reason for Call: Talk to Nurse Summary of Call: pt is requesting wi appt this am, sts her throat is swollen and she can barely swallow anything. Initial call taken by: ERIN LEVAN,  January 12, 2007 8:32 AM  Follow-up for Phone Call        appt made Follow-up by: Golden Circle RN,  January 12, 2007 8:39 AM         Appended Document: wi request unable to get a ride today. rescheduled for 8:30am. to drink hot fluids, take tylenol or ibuprofen.

## 2010-02-12 NOTE — Progress Notes (Signed)
Summary: WI request  Phone Note Call from Patient Call back at [phone]   Summary of Call: pt is wanting to be seen today for "not feeling good" - wouldn't elaborate Initial call taken by: Haydee Salter,  May 29, 2006 8:35 AM  Follow-up for Phone Call        pt reports no appetite, nausea,head spinning. has felt bad for 2 weeks. no fever. appointment scheduled today. Follow-up by: Theresia Lo RN,  May 29, 2006 8:42 AM

## 2010-02-12 NOTE — Progress Notes (Signed)
Summary: Dentist appt  Phone Note Call from Patient Call back at [phone]   Summary of Call: pt is needing to speak with someone about getting a letter for her dentist appt today stating what she can and cannot take Initial call taken by: Haydee Salter,  July 14, 2006 10:17 AM  Follow-up for Phone Call        letter done by saxon Follow-up by: Lillia Pauls CMA,  July 14, 2006 11:31 AM

## 2010-02-12 NOTE — Letter (Signed)
Summary: Generic Letter  [facility]  [address]   [place], [place] [address]   Phone: [phone]  Fax: [phone]    07/06/2008  [facility] [address] [place], [place]  [address]  Dear Ms. Blackstock,   Please call us. You missed your appointment with the nurse on 06-30-08. It is important, that you contact us.     Sincerely,   Arlyss Repress CMA,

## 2010-02-12 NOTE — Progress Notes (Signed)
Summary: Appts  Phone Note Call from Patient Call back at [phone]   Summary of Call: Pt wants to know if her case worker can make appts for her because she states that her case worker can explain things much better than she can. Initial call taken by: Haydee Salter,  February 24, 2007 11:01 AM  Follow-up for Phone Call        spoke with case worker and advised to sched. appt with s.saxon  Follow-up by: Arlyss Repress CMA,,  February 24, 2007 11:10 AM

## 2010-02-12 NOTE — Assessment & Plan Note (Signed)
Summary: DNKA- CPP   

## 2010-02-12 NOTE — Miscellaneous (Signed)
Summary: Tobacco Lenyx Boody  Clinical Lists Changes  Problems: Added new problem of TOBACCO Lallie Strahm (ICD-305.1) 

## 2010-02-12 NOTE — Assessment & Plan Note (Signed)
Summary: CPE/depo,df   Vital Signs:  Patient profile:   25 year old female Height:      61 inches Weight:      125 pounds BMI:     23.70 Pulse rate:   70 / minute BP sitting:   112 / 72  (left arm)  Vitals Entered By: Arlyss Repress CMA, (June 16, 2008 8:44 AM) CC: physical with pap. std check. left ankle pain. Is Patient Diabetic? No Pain Assessment Patient in pain? yes     Location: left ankle Intensity: 5   Primary Care Provider:  Luretha Murphy NP  CC:  physical with pap. std check. left ankle pain.Marland Kitchen  History of Present Illness: Breasts are often painful, large and does not have a good bra.    Would like to begin contraception, grandmother told her that not having a period on Depo Provera was not healthy.  She had a period last week.  Last Depo was in Sept 09/  She would like STD testing, recent sex with father of her child  Face and scalp rash, never picked up cream prescribed,.  Injured L ankle, sore and swelling/  Not taking meds for bipolar illness  Current Medications (verified): 1)  Ketoconazole 2 % Crea (Ketoconazole) .... Apply To Face Two Times A Day, 30 Gm Tube 2)  Metronidazole 500 Mg Tabs (Metronidazole) .... Two Times A Day For 7 Days 3)  Derma-Smoothe/fs Scalp 0.01 % Oil (Fluocinolone Acetonide) .... Apply To Scalp Twice Weekly, Keep On For A Least 4 Hours Then Shampoo With A Dandriff Shampoo, 1 Unit 4)  Aso Brace .... Aso Brace For Left Ankle, Left Anlke Sprain  Allergies (verified): 1)  ! Penicillin  Past History:  Past Medical History: Last updated: 09/09/2006 Chlamydia x 4  - last 12/07,  Condyloma - during pregnacy,  G2P1 FTSVD 4/06, TAB at 16 wks 7/08  Recurrent Proteus UTI - Txed (Rpt urine CS- neg),  trichomoniasis 12/07  Past Surgical History: Last updated: 09/09/2006 IUD Mirena - 11/13/2004, removed 3/08 TAB- D & E 7/08  Review of Systems General:  Denies sleep disorder. GI:  Denies constipation and diarrhea. GU:  Complains of  discharge; denies dysuria. MS:  left ankle pain. Derm:  Complains of rash; facila rash and scalp peeling.  Physical Exam  General:  Young, interesting pink and black wig, pleasant Ears:  External ear exam shows no significant lesions or deformities.  Otoscopic examination reveals clear canals, tympanic membranes are intact bilaterally without bulging, retraction, inflammation or discharge. Hearing is grossly normal bilaterally. Mouth:  Oral mucosa and oropharynx without lesions or exudates.  Teeth in fair repair. Neck:  No deformities, masses, or tenderness noted. Breasts:  pendulous, stretch marks on chest wall, no masses Lungs:  Normal respiratory effort, chest expands symmetrically. Lungs are clear to auscultation, no crackles or wheezes. Heart:  Normal rate and regular rhythm. S1 and S2 normal without gallop, murmur, click, rub or other extra sounds. Abdomen:  Bowel sounds positive,abdomen soft and non-tender without masses, organomegaly or hernias noted. Genitalia:  Normal introitus for age, no external lesions, white vaginal discharge, mucosa pink and moist, no vaginal or cervical lesions, no vaginal atrophy, no friaility or hemorrhage, normal uterus size and position, no adnexal masses or tenderness Msk:  Left ankle tr edema, good ROM, weight bearing witout diffiuclty Psych:  good eye contact and not anxious appearing.     Impression & Recommendations:  Problem # 1:  SCREENING FOR MALIGNANT NEOPLASM OF THE CERVIX (  ICD-V76.2)  Orders: Pap Smear-[facility] (78469-62952)  Problem # 2:  CONTACT OR EXPOSURE TO OTHER VIRAL DISEASES (ICD-V01.79)  Orders: GC/Chlamydia-[facility] (87591/87491) Wet Prep- [facility] (84132) [facility]- Est  Level 4 (44010)  Problem # 3:  SEBORRHEIC DERMATITIS (ICD-690.10)  klotrimazole to face, derma-smooth oil to salp  Orders: [facility]- Est  Level 4 (99214)  Problem # 4:  BIPOLAR AFFECTIVE DISORDER (ICD-296.80) No taking meds  Problem # 5:  CONTRACEPTIVE MANAGEMENT NOS  (ICD-V25.9) Counseled on hormonal contraception and amenorrhea is expected with Depo provera, also recommended she call [facility] for infomation on Implanon. return in 2 weeks for shot, no sex or condoms (gave her some) Orders: U Preg-[facility] (81025) [facility]- Est  Level 4 (27253)  Problem # 6:  ANKLE SPRAIN, LEFT (ICD-845.00) ASO brace and ABC exercises  Complete Medication List: 1)  Ketoconazole 2 % Crea (Ketoconazole) .... Apply to face two times a day, 30 gm tube 2)  Metronidazole 500 Mg Tabs (Metronidazole) .... Two times a day for 7 days 3)  Derma-smoothe/fs Scalp 0.01 % Oil (Fluocinolone acetonide) .... Apply to scalp twice weekly, keep on for a least 4 hours then shampoo with a dandriff shampoo, 1 unit 4)  Aso Brace  .... Aso brace for left ankle, left anlke sprain  Patient Instructions: 1)  Nurse visit in 2 weeks for Depo. If you decide to use Depo Provera-you must return in 2 weeks for a second pregnancy test and get the shot.  Please either do not have intercourse for the 2 weeks or use condoms. 2)  Call Health Dept [phone] for appointment to discuss Implanon Prescriptions: ASO BRACE ASO brace for left ankle, left anlke sprain Brand medically necessary #1 x 0   Entered and Authorized by:   Luretha Murphy NP   Signed by:   Luretha Murphy NP on 06/16/2008   Method used:   Print then Give to Patient   RxID:   [id number] DERMA-SMOOTHE/FS SCALP 0.01 % OIL (FLUOCINOLONE ACETONIDE) apply to scalp twice weekly, keep on for a least 4 hours then shampoo with a dandriff shampoo, 1 unit Brand medically necessary #1 x 3   Entered and Authorized by:   Luretha Murphy NP   Signed by:   Luretha Murphy NP on 06/16/2008   Method used:   Print then Give to Patient   RxID:   [id number] METRONIDAZOLE 500 MG TABS (METRONIDAZOLE) two times a day for 7 days Brand medically necessary #14 x 0   Entered and Authorized by:   Luretha Murphy NP   Signed by:   Luretha Murphy NP on 06/16/2008   Method used:   Print  then Give to Patient   RxID:   [id number] KETOCONAZOLE 2 % CREA (KETOCONAZOLE) apply to face two times a day, 30 GM tube Brand medically necessary #1 x 3   Entered and Authorized by:   Luretha Murphy NP   Signed by:   Luretha Murphy NP on 06/16/2008   Method used:   Print then Give to Patient   RxID:   [id number]   Laboratory Results   Urine Tests  Date/Time Received: June 16, 2008 8:51 AM  Date/Time Reported: June 16, 2008 9:09 AM     Urine HCG: negative Comments: ...........test performed by...........Marland KitchenTerese Door, CMA  Date/Time Received: June 16, 2008 8:51 AM  Date/Time Reported: June 16, 2008 9:09 AM   [facility] Source: vaginal WBC/hpf: 0-3 Bacteria/hpf: 3+  Cocci Clue cells/hpf: moderate  Positive whiff Yeast/hpf: none Trichomonas/hpf:  none Comments: ...........test performed by...........Marland KitchenTerese Door, CMA

## 2010-02-12 NOTE — Progress Notes (Signed)
Summary: meds prob  Phone Note Call from Patient Call back at [phone]   Caller: Patient Summary of Call: started taking meds and has questions about it. Initial call taken by: De Nurse,  June 14, 2009 9:25 AM  Follow-up for Phone Call        [facility] [address] states she stopped the metronidazole over the holiday weekend as she was drinking. also had sex with same man. wanted to know if she can just finish the pills. told her unlikely. will need to take every day for the full time & NOT have sex or alcohol.  the man needs to be treated at the same time. states they use condoms to pcp to see if she will send another rx Follow-up by: Golden Circle RN,  June 14, 2009 9:35 AM  Additional Follow-up for Phone Call Additional follow up Details #1::        Instructed to take the remaining 11 pills, two times a day until gone.  Instructed not to have sex with partner until they are treated.  Condoms are essential in prevention of STD. She has a follow up apt next week. Additional Follow-up by: Luretha Murphy NP,  June 14, 2009 10:42 AM

## 2010-02-12 NOTE — Progress Notes (Signed)
Summary: phone note  Phone Note Call from Patient Call back at [phone]   Caller: Patient Summary of Call: pt had depo shot yesterday and wants to know if it's supposed to be this sore.  she can hardly move her arm Initial call taken by: De Nurse,  October 27, 2007 12:09 PM  Follow-up for Phone Call        LMOVM asking pt to return call.  Yes Depo can make arm sore.  If it is that unbearable i suggest she get the injection in her hip next him.  She can also take tylenol. Follow-up by: Jone Baseman CMA,  October 27, 2007 5:29 PM  Additional Follow-up for Phone Call Additional follow up Details #1::        Ccalled to see if arm has improved.  Spoke to man who said she wasn't home.  Told we had asked her to call back and am just rechecking that she is ok.  He said he would tell her to call. Additional Follow-up by: Starleen Blue RN,  October 28, 2007 4:03 PM    Additional Follow-up for Phone Call Additional follow up Details #2::    Will await callback from pt. Follow-up by: [facility] CMA,  October 29, 2007 11:08 AM

## 2010-02-12 NOTE — Assessment & Plan Note (Signed)
Summary: vomitting, weight loss/ACM   Vital Signs:  Patient Profile:   25 Years Old Female Height:     61 inches Weight:      118.5 pounds BMI:     22.47 BSA:     1.51 Temp:     98.5 degrees F Pulse rate:   84 / minute BP sitting:   104 / 66  Pt. in pain?   no  Vitals Entered By: Golden Circle RN (April 08, 2006 11:12 AM)              Is Patient Diabetic? No   Chief Complaint:  n & v and wt loss.  History of Present Illness: Pt c/o weight loss x ?length of time Had son 2 yrs ago.  Lost 10lbs last year, thought it was "baby fat", went from 145-135lbs Prepregnancy wt 125lbs. Was 122lbs at the hosp 1 month ago, today wt at office is 118lbs.  Pt reports decreased appetite x 1 month, would have to force herself to eat Has been nauseated and vomiting intermittently over the last month, mostly yellowish bile Would have emesis2-3x/wk Whenever eat solid food would have emesis  ~5-32min later.  No assoc abd pain. No similar prior episodes Yesterday sxs seem to have gotten worse.  Tried to eat a meal and had to throw up in the middle of the meal, had only 2 episodes of emesis.  No abd pain with it.  No blood in emesis.   No hx of abd surgery No fever or chills   Had IUD removed last month b/c had an infection.  Was seen at [facility] was tested positive for chlamydia, treated at the hosp.  Had abd pain at the time which has since went away. Reports told partner to get treated, pt not sure if he has been.  LMP 03/31/06, normal Is sexually active, with partner for 7 yrs, no current contraception, does have concerns with fidelity of partner.  Is interested in the Depo.  Prior Medications: None Current Allergies: ! PENICILLIN    Risk Factors:  Tobacco use:  quit    Physical Exam  General:     Well-developed,well-nourished,in no acute distress; alert,appropriate and cooperative throughout examination Mouth:     Oral mucosa and oropharynx without lesions or exudates.  Teeth  in good repair. MMM Neck:     No deformities, masses, or tenderness noted. Lungs:     Normal respiratory effort, chest expands symmetrically. Lungs are clear to auscultation, no crackles or wheezes. Heart:     Normal rate and regular rhythm. S1 and S2 normal without gallop, murmur, click, rub or other extra sounds. Abdomen:     soft, mild epigastric tenderness, neg Murphy's, no rebound/guarding, no HSM    Impression & Recommendations:  Problem # 1:  WEIGHT LOSS (ICD-783.21) Will check Abd U/S r/o gallstones check labs  Orders: Comp Met-[facility] (1122334455) Amylase-[facility] (14782-95621) Lipase-[facility] (30865-78469) CBC-[facility] (62952) TSH-[facility] (84132-44010) Ultrasound (Ultrasound) [facility]- Est  Level 4 (27253)   Problem # 2:  VOMITING (ICD-787.03) will try reglan to help with nausea and to stimulate appetite also try prilosec ?gastritis  Orders: Comp Met-[facility] (66440-34742) Amylase-[facility] (59563-87564) Lipase-[facility] (33295-18841) CBC-[facility] (66063) Ultrasound (Ultrasound) U Preg-[facility] (81025) [facility]- Est  Level 4 (99214)   Problem # 3:  CONTRACEPTIVE MANAGEMENT (ICD-V25.09) Pt desires Depo shot, has been having unprotected sex UPT today was negative Will have pt return in 2 wks for repeat UPT, if neg will give Depo. Pt encouraged not to have sex until  gets Depo shot readdress treatment of partner for chlamydia at next visit f/u in 2 wks with PCP Orders: [facility]- Est  Level 4 (03474)    Laboratory Results   Urine Tests  Date/Time Recieved: April 08, 2006 12:10 PM  Date/Time Reported: April 08, 2006 12:14 PM     Urine HCG: negative Comments: ...................................................................DONNA [facility]  April 08, 2006 12:14 PM

## 2010-02-12 NOTE — Miscellaneous (Signed)
  Clinical Lists Changes  Problems: Removed problem of CHLAMYDTRACHOMATIS INFECTION LOWER GU SITES (ICD-099.53) Removed problem of SCREENING FOR MALIGNANT NEOPLASM OF THE CERVIX (ICD-V76.2) Removed problem of CONTACT OR EXPOSURE TO OTHER VIRAL DISEASES (ICD-V01.79) Removed problem of SCREENING, PULMONARY TUBERCULOSIS (ICD-V74.1) Removed problem of SCREENING FOR MALIGNANT NEOPLASM, CERVIX (ICD-V76.2)

## 2010-02-12 NOTE — Progress Notes (Signed)
Summary: Triage  Phone Note Call from Patient Call back at [phone]   Summary of Call: Pt wants to be seen tomorrow for a being late on something. Initial call taken by: Haydee Salter,  October 11, 2007 2:13 PM  Follow-up for Phone Call        this number does not work Follow-up by: Golden Circle RN,  October 11, 2007 2:35 PM  Additional Follow-up for Phone Call Additional follow up Details #1::        wanted to know when her last depo was. March. nurse visit today to get back on it Additional Follow-up by: Golden Circle RN,  October 12, 2007 10:08 AM

## 2010-02-12 NOTE — Assessment & Plan Note (Signed)
Summary: very sore throat   Vital Signs:  Patient Profile:   25 Years Old Female Height:     61 inches Weight:      122 pounds Temp:     98.9 degrees F Pulse rate:   90 / minute BP sitting:   104 / 70  Pt. in pain?   yes    Location:   throat @ night     Intensity:   10  Vitals Entered By: Jone Baseman CMA (January 13, 2007 8:55 AM)                  PCP:  Luretha Murphy NP  Chief Complaint:  very sore throat.  History of Present Illness: 25 yo with sore throat 3 days duration.  No fever reported.  Pt states feels like her throat is " closing off at night".  No sick contacts.  Also states nonproductive cough.  Decreased by mouth intake sec to sore throat.  No addominal pain.  Pt has heard of mono but states she has never had mono.    Current Allergies: ! PENICILLIN    Risk Factors:  Tobacco use:  quit    Physical Exam  General:     scratchy voice Head:     Normocephalic and atraumatic without obvious abnormalities. No apparent alopecia or balding. Eyes:     No corneal or conjunctival inflammation noted. EOMI. Perrla. Funduscopic exam benign, without hemorrhages, exudates or papilledema. Vision grossly normal. Ears:     External ear exam shows no significant lesions or deformities.  Otoscopic examination reveals clear canals, tympanic membranes are intact bilaterally without bulging, retraction, inflammation or discharge. Hearing is grossly normal bilaterally. Nose:     External nasal examination shows no deformity or inflammation. Nasal mucosa are pink and moist without lesions or exudates. Mouth:     Oral mucosa and oropharynx without lesions or exudates.  Teeth in good repair. Neck:     no adenopathy Lungs:     Normal respiratory effort, chest expands symmetrically. Lungs are clear to auscultation, no crackles or wheezes. Heart:     Normal rate and regular rhythm. S1 and S2 normal without gallop, murmur, click, rub or other extra sounds. Abdomen:  Bowel sounds positive,abdomen soft and non-tender without masses, organomegaly or hernias noted.  Spleen not palpable    Impression & Recommendations:  Problem # 1:  SORE THROAT (ICD-462) Assessment: Deteriorated rapid strep negative.  Likely viral laryngitis.  Symptomatic treatment.  Warning signs for stridor reviewed to report to ED if this develops.   Her updated medication list for this problem includes:    Flagyl 500 Mg Tabs (Metronidazole) ..... One tab by mouth two times a day x 1 week  Orders: Rapid Strep-[facility] (24401) [facility]- Est Level  3 (02725)   Complete Medication List: 1)  Flagyl 500 Mg Tabs (Metronidazole) .... One tab by mouth two times a day x 1 week   Patient Instructions: 1)  Follow up with regular provider Luretha Murphy) in 5-7 days to  make sure you are getting better.  2)  Your strept test was negative.  You likely have the common cold virus that is affecting your throat. 3)  Pamper it 4)  Gargle warm salt water every 4 hrs when awake. 5)  Use throat lozenges and chloraseptic spray 6)  May use tylenol as needed for pain 7)  If you have difficulty breathing report to the emergency room.      ] Laboratory  Results  Date/Time Received: January 13, 2007 9:43 AM  Date/Time Reported: January 13, 2007 9:53 AM   Other Tests  Rapid Strep: negative Comments: ...............test performed by......Marland KitchenBonnie A. [place], [place] (ASCP)
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
## 2010-02-12 NOTE — Assessment & Plan Note (Signed)
Summary: resume depo. last was in march  Nurse Visit   Complete Medication List: 1)  Depakote Er 250 Mg Tb24 (Divalproex sodium) .... Take one at night for five nights; then two at night for five nights and then three a night.  take with food.    Prior Medications: DEPAKOTE ER 250 MG  TB24 (DIVALPROEX SODIUM) Take one at night for five nights; then two at night for five nights and then three a night.  Take with food. Current Allergies: ! PENICILLIN Laboratory Results   Urine Tests  Date/Time Received: October 13, 2007 3:09 PM  Date/Time Reported: October 13, 2007 3:15 PM     Urine HCG: negative Comments: ...........test performed by...........Marland KitchenTerese Door, CMA      Pt wil return in 2 weeks for her second urine pregnancy test. PT had sexual intercourse last night. Discussed the morning after pill and pt could not wait.Bishop Limbo GOULD  October 13, 2007 3:29 PM   Orders Added: 1)  U Preg-[facility] [place].P[place]    ]

## 2010-02-12 NOTE — Assessment & Plan Note (Signed)
Summary: DEPO/KH  Nurse Visit    Prior Medications: DEPAKOTE ER 250 MG  TB24 (DIVALPROEX SODIUM) Take one at night for five nights; then two at night for five nights and then three a night.  Take with food. Current Allergies: ! PENICILLIN Laboratory Results   Urine Tests  Date/Time Received: October 26, 2007 2:27 PM  Date/Time Reported: October 26, 2007 2:54 PM     Urine HCG: negative CommentsJone Baseman CMA  October 26, 2007 2:54 PM       Medication Administration  Injection # 1:    Medication: Depo-Provera 150mg     Diagnosis: CONTRACEPTIVE MANAGEMENT NOS (ICD-V25.9)    Route: IM    Site: L deltoid    Exp Date: 05/14/2010    Lot #: [id number]    Mfr: [email or web address]    Comments: Pt sts that she has not had intercourse since last time she was here (pt was told wrong protocol last time)  Her upreg was negative today so depo given.  She will return in 2 weeks for 2nd upreg to be sure she is not pregnant.  Pt to RTC for next depo Dec 29 - Jan 12    Patient tolerated injection without complications    Given by: Jone Baseman CMA (October 26, 2007 2:48 PM)  Orders Added: 1)  U Preg-[facility] [81025] 2)  Depo-Provera 150mg  [J1055] 3)  Est Level 1- [facility] [place].Siad    ]  Medication Administration  Injection # 1:    Medication: Depo-Provera 150mg     Diagnosis: CONTRACEPTIVE MANAGEMENT NOS (ICD-V25.9)    Route: IM    Site: L deltoid    Exp Date: 05/14/2010    Lot #: [id number]    Mfr: [email or web address]    Comments: Pt sts that she has not had intercourse since last time she was here (pt was told wrong protocol last time)  Her upreg was negative today so depo given.  She will return in 2 weeks for 2nd upreg to be sure she is not pregnant.  Pt to RTC for next depo Dec 29 - Jan 12    Patient tolerated injection without complications    Given by: Jone Baseman CMA (October 26, 2007 2:48 PM)  Orders Added: 1)  U Preg-[facility] [81025] 2)  Depo-Provera 150mg  [J1055] 3)  Est Level 1- [facility]  [81191]

## 2010-02-12 NOTE — Progress Notes (Signed)
Summary: Pain med and antibiotic  Phone Note Call from Patient Call back at [phone]   Summary of Call: pt states we need to fax pain medication and antibiotic to dentist office fax [phone] Initial call taken by: Haydee Salter,  July 14, 2006 11:55 AM  Follow-up for Phone Call        letter done by susan Follow-up by: Lillia Pauls CMA,  July 14, 2006 12:22 PM    Appended Document: Pain med and antibiotic Content of letter was that most antibiotics are category B, that she is allergic to PCN and may have limited supply of Tylenol #3 for pain.  Need teeth extraction, and unplanned pregnancy.

## 2010-02-12 NOTE — Progress Notes (Signed)
Summary: wi request  Phone Note Call from Patient Call back at [phone]   Reason for Call: Talk to Nurse Summary of Call: pt is requesting wi appt, sts she has a bad odor and discharge Initial call taken by: ERIN LEVAN,  February 22, 2007 11:13 AM  Follow-up for Phone Call        pt walked in asking for appt. states she has had the symptoms x 3 days & has tried otc. offered workin at 1:30pm with possible wait. she declined & asked about urgent care. told her if we were open, they would send her back here. offered 8:30am Tuesday. she accepted this  Follow-up by: Golden Circle RN,  February 22, 2007 11:29 AM

## 2010-02-12 NOTE — Assessment & Plan Note (Signed)
Summary: DNKA ([place])    Current Allergies: ! PENICILLIN

## 2010-02-12 NOTE — Progress Notes (Signed)
  Phone Note Outgoing Call   Call placed by: Ardeen Garland, MD  Call placed to: Patient Summary of Call: Patient left before Depo provera shot was given.  Called to see if she still wanted it as that was why the upreg was done.  IF she calls back, please inform her of this and have her come back today or else make a nurse visit for depo.  Initial call taken by: Lamar Laundry, MD May 25th, 2011 3:05PM
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
## 2010-02-12 NOTE — Progress Notes (Signed)
Summary: [place] request  Phone Note Call from Patient Call back at Home Phone [phone]   Summary of Call: Pt states she just got the depo shot and she cannot stop bleeding.  She is requesting to speak with an RN about it. Initial call taken by: Haydee Salter,  October 09, 2006 12:01 PM  Follow-up for Phone Call        pt is very unhappy at continued period. wants a different form of birth control if this is going to continue. told her there was no way to counteract it now. If she wanted to change, call for appt before the return date for next depo-dec & see md about options Follow-up by: Golden Circle RN,  October 09, 2006 12:04 PM

## 2010-02-12 NOTE — Progress Notes (Signed)
Summary: [place] request  Phone Note Call from Patient Call back at Home Phone [phone]   Reason for Call: Talk to Nurse Summary of Call: pt is wanting to be seen for losing a lot of weight - was 145, now 120 Initial call taken by: Haydee Salter,  April 08, 2006 9:12 AM  Follow-up for Phone Call        pt states that she has not been able to hold down meals for approx. one month.  She states she has lost weight recently due to these symptoms.  Pt also states she had to have her Mirena IUD removed due to infection recently.  Altamese Cabal is her primary physician, but is out of town until next week. Scheduled appt today with L. Jones at 11:30. Follow-up by: AMY MARTIN RN,  April 08, 2006 10:14 AM

## 2010-02-20 ENCOUNTER — Encounter: Payer: Self-pay | Admitting: *Deleted

## 2010-03-27 LAB — GC/CHLAMYDIA PROBE AMP, GENITAL
Chlamydia, DNA Probe: NEGATIVE
GC Probe Amp, Genital: NEGATIVE

## 2010-03-27 LAB — WET PREP, GENITAL
Trich, Wet Prep: NONE SEEN
Yeast Wet Prep HPF POC: NONE SEEN

## 2010-03-27 LAB — POCT URINALYSIS DIPSTICK
Bilirubin Urine: NEGATIVE
Glucose, UA: NEGATIVE mg/dL
Ketones, ur: NEGATIVE mg/dL
Nitrite: NEGATIVE
Protein, ur: NEGATIVE mg/dL
Specific Gravity, Urine: 1.02 (ref 1.005–1.030)
Urobilinogen, UA: 0.2 mg/dL (ref 0.0–1.0)
pH: 6.5 (ref 5.0–8.0)

## 2010-03-27 LAB — POCT PREGNANCY, URINE: Preg Test, Ur: NEGATIVE

## 2010-03-31 LAB — POCT URINALYSIS DIP (DEVICE)
Bilirubin Urine: NEGATIVE
Glucose, UA: NEGATIVE mg/dL
Ketones, ur: NEGATIVE mg/dL
Nitrite: NEGATIVE
Protein, ur: NEGATIVE mg/dL
Specific Gravity, Urine: >=1.03 (ref 1.005–1.030)
Urobilinogen, UA: 0.2 mg/dL (ref 0.0–1.0)
pH: 6 (ref 5.0–8.0)

## 2010-03-31 LAB — WET PREP, GENITAL
Clue Cells Wet Prep HPF POC: NONE SEEN
Trich, Wet Prep: NONE SEEN
Yeast Wet Prep HPF POC: NONE SEEN

## 2010-03-31 LAB — GC/CHLAMYDIA PROBE AMP, GENITAL
Chlamydia, DNA Probe: NEGATIVE
GC Probe Amp, Genital: NEGATIVE

## 2010-04-16 ENCOUNTER — Ambulatory Visit: Payer: Self-pay | Admitting: Family Medicine

## 2010-04-30 ENCOUNTER — Emergency Department (HOSPITAL_COMMUNITY)
Admission: EM | Admit: 2010-04-30 | Discharge: 2010-04-30 | Disposition: A | Discharge status: Home / Self Care | Payer: Self-pay | Attending: Emergency Medicine | Admitting: Emergency Medicine

## 2010-04-30 DIAGNOSIS — IMO0002 Reserved for concepts with insufficient information to code with codable children: Secondary | ICD-10-CM | POA: Insufficient documentation

## 2010-04-30 DIAGNOSIS — F3289 Other specified depressive episodes: Secondary | ICD-10-CM | POA: Insufficient documentation

## 2010-04-30 DIAGNOSIS — Y929 Unspecified place or not applicable: Secondary | ICD-10-CM | POA: Insufficient documentation

## 2010-04-30 DIAGNOSIS — F329 Major depressive disorder, single episode, unspecified: Secondary | ICD-10-CM | POA: Insufficient documentation

## 2010-04-30 DIAGNOSIS — N898 Other specified noninflammatory disorders of vagina: Secondary | ICD-10-CM | POA: Insufficient documentation

## 2010-04-30 DIAGNOSIS — R21 Rash and other nonspecific skin eruption: Secondary | ICD-10-CM | POA: Insufficient documentation

## 2010-04-30 DIAGNOSIS — S40029A Contusion of unspecified upper arm, initial encounter: Secondary | ICD-10-CM | POA: Insufficient documentation

## 2010-04-30 LAB — WET PREP, GENITAL
Trich, Wet Prep: NONE SEEN
Yeast Wet Prep HPF POC: NONE SEEN

## 2010-04-30 LAB — URINE MICROSCOPIC-ADD ON

## 2010-04-30 LAB — URINALYSIS, ROUTINE W REFLEX MICROSCOPIC
Bilirubin Urine: NEGATIVE
Glucose, UA: NEGATIVE mg/dL
Ketones, ur: NEGATIVE mg/dL
Nitrite: NEGATIVE
Protein, ur: NEGATIVE mg/dL
Specific Gravity, Urine: 1.02 (ref 1.005–1.030)
Urobilinogen, UA: 0.2 mg/dL (ref 0.0–1.0)
pH: 6 (ref 5.0–8.0)

## 2010-04-30 LAB — POCT PREGNANCY, URINE: Preg Test, Ur: NEGATIVE

## 2010-05-01 LAB — GC/CHLAMYDIA PROBE AMP, GENITAL
Chlamydia, DNA Probe: NEGATIVE
GC Probe Amp, Genital: NEGATIVE

## 2010-05-31 NOTE — Discharge Summary (Signed)
Rachel Lloyd, [place]                ACCOUNT NO.:  [id number]   MEDICAL RECORD NO.:  [id number]           PATIENT TYPE:   LOCATION:  9158                           FACILITY:   PHYSICIAN:  Conni Elliot, M.D.DATE OF BIRTH:  [birth year]   DATE OF ADMISSION:  03/16/2004  DATE OF DISCHARGE:  03/21/2004                                 DISCHARGE SUMMARY   ADMISSION DIAGNOSES:  1.  A 34 weeks intrauterine pregnancy with preterm contractions.  2.  Group B Streptococcus positive with a penicillin allergy.  3.  History of Chlamydia, previously treated.   DISCHARGE DIAGNOSES:  1.  A 35 weeks intrauterine pregnancy.  2.  Status post five days of clindamycin and gentamicin.   DISCHARGE MEDICATION:  Prenatal vitamins one tablet p.o. q day.   HISTORY OF PRESENT ILLNESS:  The patient is a 25 year old, G1 presenting at  34 weeks and 3 days with preterm contractions.  Initial intake labs showed a  wet prep that was negative with the exception of many white blood cells.  Fetal fibronectin was positive.  GBS probe was also positive.  Initial  cervical exam:  Closed 50% and -2 very soft.   HOSPITAL COURSE:  Problem 1. A 34 weeks intrauterine pregnancy with preterm  contractions.  The patient was admitted and placed on electronic and fetal  monitoring and tachometer.  The patient did not go into labor and her  contractions eased off.  Over the course of hospitalization, the patient had  fewer and fewer contractions that were picked up on the monitor and the  patient had fewer and fewer contractions of which she was aware.  During  this hospitalization, electronic fetal monitoring remained reassuring.  At  the time of discharge, the patient was 35 weeks and 1 day, stable with  reassuring monitoring for this intrauterine pregnancy.   Problem 2. Group B Streptococcus positive with history of penicillin  allergy.  The patient was admitted.  Initial GBS probe was positive.  The  patient was given five  days of clindamycin.  GBS culture results became  available on 03/21/2004.  This culture showed no GBS growth.  By the time of  discharge, the patient was stable and had completed the course of  antibiotics without complication.   Problem 3. History of Chlamydia previously treated.  The patient was  admitted and also placed on gentamicin without complication.   CONDITION ON DISCHARGE:  Stable at 35 weeks pregnancy with reassuring fetal  monitoring.   DISCHARGE INSTRUCTIONS:  Per antenatal handout.      GSD/MEDQ  D:  03/21/2004  T:  03/21/2004  Job:  [id number]   cc:   Lawerance Sabal, MD  Fax: [phone]

## 2010-08-13 ENCOUNTER — Inpatient Hospital Stay (INDEPENDENT_AMBULATORY_CARE_PROVIDER_SITE_OTHER)
Admission: RE | Admit: 2010-08-13 | Discharge: 2010-08-13 | Disposition: A | Discharge status: Home / Self Care | Payer: Self-pay | Source: Ambulatory Visit | Attending: Family Medicine | Admitting: Family Medicine

## 2010-08-13 DIAGNOSIS — Z3201 Encounter for pregnancy test, result positive: Secondary | ICD-10-CM

## 2010-08-13 LAB — CBC
HCT: 36.1 % (ref 36.0–46.0)
Hemoglobin: 12.8 g/dL (ref 12.0–15.0)
MCH: 31.1 pg (ref 26.0–34.0)
MCHC: 35.5 g/dL (ref 30.0–36.0)
MCV: 87.8 fL (ref 78.0–100.0)
Platelets: 263 10*3/uL (ref 150–400)
RBC: 4.11 MIL/uL (ref 3.87–5.11)
RDW: 12.3 % (ref 11.5–15.5)
WBC: 7.1 10*3/uL (ref 4.0–10.5)

## 2010-08-13 LAB — POCT I-STAT, CHEM 8
BUN: 5 mg/dL — ABNORMAL LOW (ref 6–23)
Calcium, Ion: 1.18 mmol/L (ref 1.12–1.32)
Chloride: 102 mEq/L (ref 96–112)
Creatinine, Ser: 0.6 mg/dL (ref 0.50–1.10)
Glucose, Bld: 109 mg/dL — ABNORMAL HIGH (ref 70–99)
HCT: 39 % (ref 36.0–46.0)
Hemoglobin: 13.3 g/dL (ref 12.0–15.0)
Potassium: 3.7 mEq/L (ref 3.5–5.1)
Sodium: 135 mEq/L (ref 135–145)
TCO2: 21 mmol/L (ref 0–100)

## 2010-08-13 LAB — DIFFERENTIAL
Basophils Absolute: 0 10*3/uL (ref 0.0–0.1)
Basophils Relative: 0 % (ref 0–1)
Eosinophils Absolute: 0.1 10*3/uL (ref 0.0–0.7)
Eosinophils Relative: 1 % (ref 0–5)
Lymphocytes Relative: 23 % (ref 12–46)
Lymphs Abs: 1.7 10*3/uL (ref 0.7–4.0)
Monocytes Absolute: 0.5 10*3/uL (ref 0.1–1.0)
Monocytes Relative: 7 % (ref 3–12)
Neutro Abs: 4.9 10*3/uL (ref 1.7–7.7)
Neutrophils Relative %: 69 % (ref 43–77)

## 2010-08-13 LAB — POCT URINALYSIS DIP (DEVICE)
Bilirubin Urine: NEGATIVE
Glucose, UA: NEGATIVE mg/dL
Ketones, ur: 40 mg/dL — AB
Leukocytes, UA: NEGATIVE
Nitrite: NEGATIVE
Protein, ur: 30 mg/dL — AB
Specific Gravity, Urine: 1.02 (ref 1.005–1.030)
Urobilinogen, UA: 1 mg/dL (ref 0.0–1.0)
pH: 7 (ref 5.0–8.0)

## 2010-08-13 LAB — POCT PREGNANCY, URINE: Preg Test, Ur: POSITIVE

## 2010-08-16 ENCOUNTER — Inpatient Hospital Stay (HOSPITAL_COMMUNITY): Payer: Medicaid Other

## 2010-08-16 ENCOUNTER — Encounter (HOSPITAL_COMMUNITY): Payer: Self-pay

## 2010-08-16 ENCOUNTER — Inpatient Hospital Stay (HOSPITAL_COMMUNITY)
Admission: AD | Admit: 2010-08-16 | Discharge: 2010-08-16 | Disposition: A | Discharge status: Home / Self Care | Payer: Medicaid Other | Source: Ambulatory Visit | Attending: Obstetrics and Gynecology | Admitting: Obstetrics and Gynecology

## 2010-08-16 DIAGNOSIS — R109 Unspecified abdominal pain: Secondary | ICD-10-CM | POA: Insufficient documentation

## 2010-08-16 DIAGNOSIS — O210 Mild hyperemesis gravidarum: Secondary | ICD-10-CM | POA: Insufficient documentation

## 2010-08-16 DIAGNOSIS — O219 Vomiting of pregnancy, unspecified: Secondary | ICD-10-CM

## 2010-08-16 DIAGNOSIS — R52 Pain, unspecified: Secondary | ICD-10-CM

## 2010-08-16 HISTORY — DX: Mental disorder, not otherwise specified: F99

## 2010-08-16 LAB — URINALYSIS, ROUTINE W REFLEX MICROSCOPIC
Glucose, UA: NEGATIVE mg/dL
Ketones, ur: 40 mg/dL — AB
Leukocytes, UA: NEGATIVE
Nitrite: NEGATIVE
Protein, ur: NEGATIVE mg/dL
Specific Gravity, Urine: >1.03 — ABNORMAL HIGH (ref 1.005–1.030)
Urobilinogen, UA: 1 mg/dL (ref 0.0–1.0)
pH: 6 (ref 5.0–8.0)

## 2010-08-16 LAB — URINE MICROSCOPIC-ADD ON

## 2010-08-16 LAB — CBC
HCT: 35.9 % — ABNORMAL LOW (ref 36.0–46.0)
Hemoglobin: 12.6 g/dL (ref 12.0–15.0)
MCH: 31.7 pg (ref 26.0–34.0)
MCHC: 35.1 g/dL (ref 30.0–36.0)
MCV: 90.2 fL (ref 78.0–100.0)
Platelets: 246 10*3/uL (ref 150–400)
RBC: 3.98 MIL/uL (ref 3.87–5.11)
RDW: 12.3 % (ref 11.5–15.5)
WBC: 8.9 10*3/uL (ref 4.0–10.5)

## 2010-08-16 LAB — WET PREP, GENITAL
Clue Cells Wet Prep HPF POC: NONE SEEN
Trich, Wet Prep: NONE SEEN
Yeast Wet Prep HPF POC: NONE SEEN

## 2010-08-16 LAB — HCG, QUANTITATIVE, PREGNANCY: hCG, Beta Chain, Quant, S: 96856 m[IU]/mL — ABNORMAL HIGH (ref <5)

## 2010-08-16 MED ORDER — OXYCODONE-ACETAMINOPHEN 5-325 MG PO TABS
2.0000 | ORAL_TABLET | Freq: Once | ORAL | Status: AC
  Administered 2010-08-16: 2 via ORAL
  Filled 2010-08-16: qty 2

## 2010-08-16 MED ORDER — PROMETHAZINE HCL 25 MG PO TABS: 25.0000 mg | ORAL_TABLET | Freq: Four times a day (QID) | ORAL | Status: DC | PRN

## 2010-08-16 MED ORDER — DEXTROSE 5 % IN LACTATED RINGERS IV BOLUS
1000.0000 mL | Freq: Once | INTRAVENOUS | Status: AC
  Administered 2010-08-16: 1000 mL via INTRAVENOUS

## 2010-08-16 MED ORDER — ONDANSETRON 8 MG PO TBDP: 8.0000 mg | ORAL_TABLET | Freq: Three times a day (TID) | ORAL | Status: AC | PRN

## 2010-08-16 MED ORDER — NYSTATIN-TRIAMCINOLONE 100000-0.1 UNIT/GM-% EX OINT: TOPICAL_OINTMENT | Freq: Two times a day (BID) | CUTANEOUS | Status: DC

## 2010-08-16 MED ORDER — ONDANSETRON HCL 4 MG/2ML IJ SOLN
4.0000 mg | Freq: Once | INTRAMUSCULAR | Status: AC
  Administered 2010-08-16: 4 mg via INTRAVENOUS
  Filled 2010-08-16: qty 2

## 2010-08-16 NOTE — ED Provider Notes (Addendum)
History     Chief Complaint  Patient presents with  . Abdominal Pain   HPIpt presents with nausea and vomiting in pregnancy.  Pt was initially seen on 7/31 at Urgent Care and diagnosed with pregnancy.  She is seen today with RLQ pain and unable to eat since yesterday due to nausea and vomiting.  She denies UTI symptoms, spotting or bleeding, constipation or diarrhea, or vaginal discharge.  She has not taken anything for the pain or nausea or vomiting.     Past Medical History  Diagnosis Date  . Mental disorder 2010    bipolar  . Scabies exposure 2012    treated july 2012 & reexposed    Past Surgical History  Procedure Date  . Mouth surgery     as a child    Family History  Problem Relation Age of Onset  . Hypertension Father     History  Substance Use Topics  . Smoking status: Former Smoker    Types: Cigarettes  . Smokeless tobacco: Never Used  . Alcohol Use: No     quit when found out was pregnant    Allergies:  Allergies  Allergen Reactions  . Penicillins     REACTION: swelling    Prescriptions prior to admission  Medication Sig Dispense Refill  . metroNIDAZOLE (FLAGYL) 500 MG tablet 2 (two) times daily. For 7 days         ROS Physical Exam   Blood pressure 110/61, pulse 100, temperature 99 F (37.2 C), temperature source Oral, resp. rate 16, height 4' 10.5" (1.486 m), weight 128 lb 3.2 oz (58.151 kg), SpO2 99.00%.  Physical Exam  Nursing note and vitals reviewed. Constitutional: She appears well-developed and well-nourished.  HENT:  Head: Normocephalic.  Eyes: Pupils are equal, round, and reactive to light.  Neck: Normal range of motion.  Cardiovascular: Normal rate.   GI: Soft. There is tenderness. There is no rebound and no guarding.  Genitourinary: Vagina normal and uterus normal. No vaginal discharge found.  Musculoskeletal: Normal range of motion.  Skin: Skin is warm and dry.       Diagnosed with Scabies- has been treated; has some dry  scaly  patches around nipples bilaterally    MAU Course  Procedures Ultrasound showed singling living IUGS with CRL indicating a gestational age of [redacted]w[redacted]d.  There cyst measuring 2.95 x 1.82 x 2.83 cm MDM Pt felt much better with her nausea after IVF and Zofran, but still having RLQ pain and wanting something for pain- percocet 2 tabs given  Assessment and Plan  abd pain in pregnancy Nausea and vomiting in pregnancy  Arshan Jabs 08/16/2010, 12:34 PM

## 2010-08-16 NOTE — Progress Notes (Signed)
Pt states she has been having pain around the umbilical are for about one week .Also lower abdominal cramping off and on.  Had a POS UPT at Urgent care 2 days ago. Pt also has been treated for scabies last month, and has been re exposed to scabies and is now having a rash and itching, worse on both breasts.

## 2010-08-17 LAB — GC/CHLAMYDIA PROBE AMP, GENITAL
Chlamydia, DNA Probe: NEGATIVE
GC Probe Amp, Genital: NEGATIVE

## 2010-08-25 ENCOUNTER — Inpatient Hospital Stay (HOSPITAL_COMMUNITY)
Admission: AD | Admit: 2010-08-25 | Discharge: 2010-08-25 | Disposition: A | Discharge status: Home / Self Care | Payer: Medicaid Other | Source: Ambulatory Visit | Attending: Obstetrics & Gynecology | Admitting: Obstetrics & Gynecology

## 2010-08-25 DIAGNOSIS — R112 Nausea with vomiting, unspecified: Secondary | ICD-10-CM

## 2010-08-25 DIAGNOSIS — O210 Mild hyperemesis gravidarum: Secondary | ICD-10-CM | POA: Insufficient documentation

## 2010-08-25 LAB — POCT PREGNANCY, URINE: Preg Test, Ur: POSITIVE

## 2010-08-25 LAB — URINALYSIS, ROUTINE W REFLEX MICROSCOPIC
Bilirubin Urine: NEGATIVE
Glucose, UA: NEGATIVE mg/dL
Hgb urine dipstick: NEGATIVE
Ketones, ur: 15 mg/dL — AB
Leukocytes, UA: NEGATIVE
Nitrite: NEGATIVE
Protein, ur: NEGATIVE mg/dL
Specific Gravity, Urine: 1.015 (ref 1.005–1.030)
Urobilinogen, UA: 1 mg/dL (ref 0.0–1.0)
pH: 7 (ref 5.0–8.0)

## 2010-08-25 MED ORDER — ONDANSETRON HCL 4 MG/2ML IJ SOLN
4.0000 mg | Freq: Once | INTRAMUSCULAR | Status: AC
Start: 2010-08-25 — End: 2010-08-25
  Administered 2010-08-25: 4 mg via INTRAVENOUS
  Filled 2010-08-25: qty 2

## 2010-08-25 MED ORDER — LACTATED RINGERS IV SOLN
INTRAVENOUS | Status: AC
  Administered 2010-08-25: 17:00:00 via INTRAVENOUS

## 2010-08-25 NOTE — ED Provider Notes (Signed)
Chief Complaint:  Nausea and vomiting  Rachel Lloyd is  25 y.o. [phone].  No LMP recorded. Patient is pregnant..  Her pregnancy status is positive.  She presents complaining of nausea and vomiting. Onset is described as ongoing and has been present for  4 days.   Obstetrical/Gynecological History: OB History    Grav Para Term Preterm Abortions TAB SAB Ect Mult Living   3 1 1  0 1 1 0 0 0 1      Past Medical History: Past Medical History  Diagnosis Date  . Mental disorder 2010    bipolar  . Scabies exposure 2012    treated july 2012 & reexposed    Past Surgical History: Past Surgical History  Procedure Date  . Mouth surgery     as a child    Family History: Family History  Problem Relation Age of Onset  . Hypertension Father     Social History: History  Substance Use Topics  . Smoking status: Former Smoker    Types: Cigarettes  . Smokeless tobacco: Never Used  . Alcohol Use: No     quit when found out was pregnant    Allergies:  Allergies  Allergen Reactions  . Penicillins     REACTION: swelling    Prescriptions prior to admission  Medication Sig Dispense Refill  . nystatin-triamcinolone (MYCOLOG) ointment Apply topically 2 (two) times daily.  30 g  0    Review of Systems - General ROS: positive for  - nausea and vomiting for 4 days.  Throws up antiemetic  Physical Exam: HRR&R, LCTA, abd soft, non-tender   Blood pressure 102/53, pulse 89, temperature 98.7 F (37.1 C), temperature source Oral, resp. rate 18.  General: General appearance - oriented to person, place, and time and ill-appearing Focused Gynecological Exam: examination not indicated  Labs:   Imaging Studies:  US Ob Comp Less 14 Wks  08/16/2010  OBSTETRICAL ULTRASOUND: This exam was performed within a [facility]. The OB US report was generated in the AS system, and faxed to the ordering physician.   This report is also available in [employer] and in the [employer]. See AS Obstetric US report.   US Ob Transvaginal  08/16/2010  OBSTETRICAL ULTRASOUND: This exam was performed within a [facility]. The OB US report was generated in the AS system, and faxed to the ordering physician.   This report is also available in [employer] and in the [employer]. See AS Obstetric US report.     Assessment: Patient Active Problem List  Diagnoses  . BIPOLAR AFFECTIVE DISORDER  . TOBACCO USER  . SEBORRHEIC DERMATITIS    Plan: IV fluids, IV antiemetic  CRESENZO-DISHMAN,Leyland Kenna 08/25/2010,4:34 PM

## 2010-08-25 NOTE — Progress Notes (Signed)
Pt c/o vomiting for the past 4 days, and unable to keep anything down.  Plans to start care at [facility] this week.

## 2010-08-28 ENCOUNTER — Other Ambulatory Visit: Payer: Self-pay

## 2010-08-30 NOTE — ED Provider Notes (Signed)
IUP on [place].  Agree with above note.  Rachel Lloyd H. 08/30/2010 10:03 AM

## 2010-09-02 ENCOUNTER — Other Ambulatory Visit: Payer: Self-pay

## 2010-09-02 DIAGNOSIS — Z3480 Encounter for supervision of other normal pregnancy, unspecified trimester: Secondary | ICD-10-CM

## 2010-09-02 LAB — HIV ANTIBODY (ROUTINE TESTING W REFLEX): HIV: NONREACTIVE

## 2010-09-02 NOTE — Progress Notes (Signed)
Prenatal labs done today Rachel Lloyd 

## 2010-09-03 LAB — OBSTETRIC PANEL
Antibody Screen: NEGATIVE
Basophils Absolute: 0 10*3/uL (ref 0.0–0.1)
Basophils Relative: 0 % (ref 0–1)
Eosinophils Absolute: 0.1 10*3/uL (ref 0.0–0.7)
Eosinophils Relative: 2 % (ref 0–5)
HCT: 35.5 % — ABNORMAL LOW (ref 36.0–46.0)
Hemoglobin: 12.4 g/dL (ref 12.0–15.0)
Hepatitis B Surface Ag: NEGATIVE
Lymphocytes Relative: 23 % (ref 12–46)
Lymphs Abs: 1.7 10*3/uL (ref 0.7–4.0)
MCH: 31.4 pg (ref 26.0–34.0)
MCHC: 34.9 g/dL (ref 30.0–36.0)
MCV: 89.9 fL (ref 78.0–100.0)
Monocytes Absolute: 0.6 10*3/uL (ref 0.1–1.0)
Monocytes Relative: 8 % (ref 3–12)
Neutro Abs: 5 10*3/uL (ref 1.7–7.7)
Neutrophils Relative %: 67 % (ref 43–77)
Platelets: 303 10*3/uL (ref 150–400)
RBC: 3.95 MIL/uL (ref 3.87–5.11)
RDW: 12.1 % (ref 11.5–15.5)
Rh Type: POSITIVE
Rubella: 439.3 IU/mL — ABNORMAL HIGH
WBC: 7.4 10*3/uL (ref 4.0–10.5)

## 2010-09-03 LAB — SICKLE CELL SCREEN: Sickle Cell Screen: NEGATIVE

## 2010-09-04 ENCOUNTER — Encounter: Payer: Self-pay | Admitting: Family Medicine

## 2010-09-04 ENCOUNTER — Ambulatory Visit (INDEPENDENT_AMBULATORY_CARE_PROVIDER_SITE_OTHER): Payer: Self-pay | Admitting: Family Medicine

## 2010-09-04 VITALS — BP 113/77 | Temp 98.6°F | Wt 127.0 lb

## 2010-09-04 DIAGNOSIS — F319 Bipolar disorder, unspecified: Secondary | ICD-10-CM

## 2010-09-04 DIAGNOSIS — IMO0002 Reserved for concepts with insufficient information to code with codable children: Secondary | ICD-10-CM | POA: Insufficient documentation

## 2010-09-04 DIAGNOSIS — T7421XA Adult sexual abuse, confirmed, initial encounter: Secondary | ICD-10-CM

## 2010-09-04 LAB — CULTURE, OB URINE: Colony Count: 5000
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
## 2010-09-04 NOTE — Patient Instructions (Signed)
Please call [facility] today. Please call me at [phone] when you have an appointment time and date.  If you have any questions/concerns, please do not hesitate to call.

## 2010-09-17 ENCOUNTER — Ambulatory Visit (INDEPENDENT_AMBULATORY_CARE_PROVIDER_SITE_OTHER): Payer: Self-pay | Admitting: Family Medicine

## 2010-09-17 VITALS — BP 105/68 | HR 99 | Temp 98.6°F | Wt 127.0 lb

## 2010-09-17 DIAGNOSIS — R109 Unspecified abdominal pain: Secondary | ICD-10-CM

## 2010-09-17 DIAGNOSIS — N949 Unspecified condition associated with female genital organs and menstrual cycle: Secondary | ICD-10-CM

## 2010-09-17 DIAGNOSIS — L293 Anogenital pruritus, unspecified: Secondary | ICD-10-CM

## 2010-09-17 DIAGNOSIS — N760 Acute vaginitis: Secondary | ICD-10-CM

## 2010-09-17 DIAGNOSIS — N898 Other specified noninflammatory disorders of vagina: Secondary | ICD-10-CM | POA: Insufficient documentation

## 2010-09-17 DIAGNOSIS — R102 Pelvic and perineal pain: Secondary | ICD-10-CM

## 2010-09-17 LAB — POCT UA - MICROSCOPIC ONLY: WBC, Ur, HPF, POC: >20

## 2010-09-17 LAB — POCT WET PREP (WET MOUNT)
Clue Cells Wet Prep HPF POC: NEGATIVE
Trichomonas Wet Prep HPF POC: NEGATIVE
Yeast Wet Prep HPF POC: NEGATIVE

## 2010-09-17 LAB — POCT URINALYSIS DIPSTICK
Bilirubin, UA: NEGATIVE
Glucose, UA: NEGATIVE
Ketones, UA: NEGATIVE
Nitrite, UA: NEGATIVE
Spec Grav, UA: 1.02
Urobilinogen, UA: 1
pH, UA: 8.5

## 2010-09-17 MED ORDER — FLUCONAZOLE 150 MG PO TABS: 150.0000 mg | ORAL_TABLET | Freq: Once | ORAL | Status: DC

## 2010-09-17 NOTE — Assessment & Plan Note (Addendum)
Irritation and physical exam c/w candidal infection. Will give 1x fluconazole and f/u prn.

## 2010-09-17 NOTE — Progress Notes (Signed)
  Subjective:    Patient ID: Rachel Lloyd, female    DOB: [birth year], 25 y.o.   MRN: [id number]  HPI  1. Right abdominal pain. Has been present for weeks in RLQ and groin and back. Feels like cramping, keeps her awake at night. Accompanied by n/v since she found out about pregnancy. She is keeping down bland foods and phenergan is helping. Denies fevers, hematuria, dysuria, hematochezia.   2. Vaginal discharge. Has a white vaginal discharge and irritation/itching in her vaginal area for past several days. Is still sexually active, but has pain and burning with intercourse.   Has a hx of chlamydia treated years ago and BV treated recently.   3. Pregnancy. States that she cannot remember the exact events leading up to discovery of her pregnancy. She is not sure of who the father of baby is, but her boyfriend thinks it is him so the patient has decided to keep the baby.   Review of Systems See HPI. No dysuria, fevers, vaginal bleeding, LOF, rash, ulcers.     Objective:   Physical Exam  Vitals reviewed. Constitutional: She is oriented to person, place, and time. She appears well-developed and well-nourished. No distress.  HENT:  Head: Normocephalic and atraumatic.  Mouth/Throat: Oropharynx is clear and moist. No oropharyngeal exudate.  Eyes: EOM are normal.  Cardiovascular: Normal rate, regular rhythm, normal heart sounds and intact distal pulses.   No murmur heard. Pulmonary/Chest: Effort normal.  Abdominal: Soft. Bowel sounds are normal. She exhibits no distension. There is tenderness.       Mild TTP in RLQ and right flank. No rebound tenderness. Uterus palpated below umbilicus.  Genitourinary: Uterus normal. Vaginal discharge found.       Erythema and dry skin surrounding perineum and vaginal mucosa. No ulceration. Moderate white discharge noted. No cervical motion tenderness.   Musculoskeletal: She exhibits no edema.  Neurological: She is alert and oriented to person, place, and  time. No cranial nerve deficit.          Assessment & Plan:

## 2010-09-17 NOTE — Patient Instructions (Signed)
I will call you if your tests are not normal. If you have fevers, increasing pains, cannot keep down food, have vaginal bleeding then please come to clinic or go to [facility]. You may take tylenol (acetaminophen) 650mg  every 4 hours for pain. Make an appointment for your regular OB visit in next 2-3 weeks.  Pregnancy - First Trimester The first trimester is the first three months your baby is growing inside you. It is important to follow your doctor's instructions. HOME CARE  Do not smoke.   Do not drink alcohol.   Only take medicine the doctor tells you to take.   Exercise.   Eat healthy foods. Eat regular, well balanced meals.   You can have sex if there are no other problems with the pregnancy.   Things that help with vomiting (morning sickness).   Eat soda crackers before getting up in the morning.   Eat 4 to 5 small meals rather than 3 large meals.   Drink liquids between meals, not during meals.  Exams are usually done every month during the first three months.  See your doctor as often as directed.   There are tests done to make sure you and your baby are healthy. These include:   Blood tests.   Urine tests   Checking your weight and blood pressure.   PAP Test   Cultures of the cervix to make sure there is no infection.   Sometimes, you need extra vitamins and iron during pregnancy. Ask your doctor if you might need these.  GET HELP RIGHT AWAY IF:  You have a fever, by mouth of 100 F (37.8 C).   Bad smelling fluid is leaking from your vagina.   There is bleeding from the vagina.   You develop severe belly or back pain.   You vomit blood. You vomit what looks like coffee grounds.   You lose more than 2 pounds in a week.   You gain 5 pounds or more in a week.   You gain more than 2 pounds in a week AND you see swelling in your feet, ankles, or legs.   You have severe dizziness or faint.   You are around people who have [place] measles.    You are around people who have chicken pox.   You are around people who have fifth disease.   You have a headache, diarrhea, pain with peeing, or can not breath right.  Document Released: 06/18/2007 Document Re-Released: 11/02/2007 [facility] Patient Information 2011 [place], [place].

## 2010-09-17 NOTE — Assessment & Plan Note (Addendum)
Uncertain of cause. Will check UA and urine culture to rule out urinary source (although no UTI symptoms or fever), repeat GC/Ch and wet prep since patient has high-risk sexual activity, has discharge, and is a poor historian. Evaluated in [facility] weeks ago and pelvic US showed a right simple ovarian cyst otherwise no obvious etiology so unlikely that imaging would reveal new findings today. This is possibly pregnancy-related cramping. Advised to try acetaminophen prn for cramping and follow up if worsening pain, fevers, hematuria, unable to tolerate solids. Pt denies need for additional anti-emetics.

## 2010-09-18 LAB — GC/CHLAMYDIA PROBE AMP, GENITAL
Chlamydia, DNA Probe: NEGATIVE
GC Probe Amp, Genital: NEGATIVE

## 2010-09-18 LAB — CULTURE, OB URINE: Colony Count: 7000

## 2010-10-04 LAB — POCT URINALYSIS DIP (DEVICE)
Glucose, UA: NEGATIVE
Ketones, ur: NEGATIVE
Nitrite: NEGATIVE
Operator id: 235561
Protein, ur: 30 — AB
Specific Gravity, Urine: 1.025
Urobilinogen, UA: 1
pH: 6

## 2010-10-04 LAB — POCT PREGNANCY, URINE
Operator id: 235561
Preg Test, Ur: NEGATIVE

## 2010-10-04 LAB — GC/CHLAMYDIA PROBE AMP, GENITAL
Chlamydia, DNA Probe: NEGATIVE
GC Probe Amp, Genital: NEGATIVE

## 2010-10-04 LAB — WET PREP, GENITAL
Trich, Wet Prep: NONE SEEN
Yeast Wet Prep HPF POC: NONE SEEN

## 2010-10-15 ENCOUNTER — Encounter: Payer: Self-pay | Admitting: Family Medicine

## 2010-10-22 ENCOUNTER — Telehealth: Payer: Self-pay | Admitting: Family Medicine

## 2010-10-22 NOTE — Telephone Encounter (Signed)
I called this patient to schedule new OB, but the call could not go through.  I called the operator to dial the number too and still unsuccessful.  Do you mind trying to reschedule a new OB for this patient?  According to her appt on 9/4 - she is keeping the baby.  Thanks.

## 2010-10-23 NOTE — Telephone Encounter (Signed)
I attempted to call patient and number was disconnected

## 2010-10-23 NOTE — Telephone Encounter (Signed)
Attempted to call patient once again

## 2010-10-24 ENCOUNTER — Encounter: Payer: Self-pay | Admitting: Family Medicine

## 2010-10-24 NOTE — Telephone Encounter (Signed)
Attempted to call patient, phone is still disconnected

## 2010-10-24 NOTE — Telephone Encounter (Signed)
Rachel Lloyd is unavailable. Since we have made several attempts to contact patient will wait for the pt to contact our office to set appt.Rachel Lloyd [place]

## 2010-10-28 ENCOUNTER — Telehealth: Payer: Self-pay | Admitting: Family Medicine

## 2010-10-28 NOTE — Telephone Encounter (Signed)
Pt called again to see if she can get the note for tomorrow - for cleaning.

## 2010-10-28 NOTE — Telephone Encounter (Signed)
Letter printed out and set up front for pt to pick up,.

## 2010-10-28 NOTE — Telephone Encounter (Signed)
Per Dr Mauricio Po OK to give letter to Pt for teeth cleaning.  Pt has Initial OB visit sched for later this month.

## 2010-10-28 NOTE — Telephone Encounter (Signed)
I don't know anything about this letter for dental cleaning.  I sent this patient a letter to schedule an initial OB visit unless she is getting care elsewhere.  If she is still pregnant, she needs to start getting prenatal care.

## 2010-10-28 NOTE — Telephone Encounter (Signed)
Needs a letter for her dentist appointment at 9 tomorrow, stating that she is pregnant.  She wants to pick this letter up.  She just needs a call when it is ready.

## 2010-10-28 NOTE — Telephone Encounter (Signed)
When I called pt to tell her letter is ready pt states the appt is for a teeth cleaning and maybe a root canal and that the letter needs to state it is OK for that work to be done w/her being pregnant.

## 2010-10-29 LAB — GC/CHLAMYDIA PROBE AMP, GENITAL
Chlamydia, DNA Probe: NEGATIVE
GC Probe Amp, Genital: NEGATIVE

## 2010-10-29 LAB — WET PREP, GENITAL
Clue Cells Wet Prep HPF POC: NONE SEEN
Trich, Wet Prep: NONE SEEN
Yeast Wet Prep HPF POC: NONE SEEN

## 2010-10-30 LAB — WET PREP, GENITAL
Clue Cells Wet Prep HPF POC: NONE SEEN
Trich, Wet Prep: NONE SEEN
Yeast Wet Prep HPF POC: NONE SEEN

## 2010-10-30 LAB — GC/CHLAMYDIA PROBE AMP, GENITAL
Chlamydia, DNA Probe: NEGATIVE
GC Probe Amp, Genital: NEGATIVE

## 2010-10-30 LAB — ABO/RH: ABO/RH(D): A POS

## 2010-11-05 ENCOUNTER — Encounter: Payer: Self-pay | Admitting: Family Medicine

## 2010-11-05 ENCOUNTER — Ambulatory Visit (INDEPENDENT_AMBULATORY_CARE_PROVIDER_SITE_OTHER): Payer: Medicaid Other | Admitting: Family Medicine

## 2010-11-05 VITALS — BP 114/74 | Temp 98.4°F | Wt 143.7 lb

## 2010-11-05 DIAGNOSIS — N898 Other specified noninflammatory disorders of vagina: Secondary | ICD-10-CM

## 2010-11-05 DIAGNOSIS — L293 Anogenital pruritus, unspecified: Secondary | ICD-10-CM

## 2010-11-05 DIAGNOSIS — Z331 Pregnant state, incidental: Secondary | ICD-10-CM

## 2010-11-05 MED ORDER — PRENATAL RX 60-1 MG PO TABS: 1.0000 | ORAL_TABLET | Freq: Every day | ORAL | Status: DC

## 2010-11-05 MED ORDER — FLUCONAZOLE 150 MG PO TABS: 150.0000 mg | ORAL_TABLET | Freq: Once | ORAL | Status: AC

## 2010-11-05 NOTE — Patient Instructions (Signed)
Please schedule follow up appointment in 4 weeks.  Pregnancy - Second Trimester  The second trimester of pregnancy (3 to 6 months) is a period of rapid growth for you and your baby. At the end of the sixth month, your baby is about 9 inches long and weighs 1 1/2 pounds. You will begin to feel the baby move between 18 and 20 weeks of the pregnancy. This is called quickening. Weight gain is faster. A clear fluid (colostrum) may leak out of your breasts. You may feel small contractions of the womb (uterus). This is known as false labor or Braxton-Hicks contractions. This is like a practice for labor when the baby is ready to be born. Usually, the problems with morning sickness have usually passed by the end of your first trimester. Some women develop small dark blotches (called cholasma, mask of pregnancy) on their face that usually goes away after the baby is born. Exposure to the sun makes the blotches worse. Acne may also develop in some pregnant women and pregnant women who have acne, may find that it goes away. PRENATAL EXAMS  Blood work may continue to be done during prenatal exams. These tests are done to check on your health and the probable health of your baby. Blood work is used to follow your blood levels (hemoglobin). Anemia (low hemoglobin) is common during pregnancy. Iron and vitamins are given to help prevent this. You will also be checked for diabetes between 24 and 28 weeks of the pregnancy. Some of the previous blood tests may be repeated.   The size of the uterus is measured during each visit. This is to make sure that the baby is continuing to grow properly according to the dates of the pregnancy.   Your blood pressure is checked every prenatal visit. This is to make sure you are not getting toxemia.   Your urine is checked to make sure you do not have an infection, diabetes or protein in the urine.   Your weight is checked often to make sure gains are happening at the suggested rate.  This is to ensure that both you and your baby are growing normally.   Sometimes, an ultrasound is performed to confirm the proper growth and development of the baby. This is a test which bounces harmless sound waves off the baby so your caregiver can more accurately determine due dates.  Sometimes, a specialized test is done on the amniotic fluid surrounding the baby. This test is called an amniocentesis. The amniotic fluid is obtained by sticking a needle into the belly (abdomen). This is done to check the chromosomes in instances where there is a concern about possible genetic problems with the baby. It is also sometimes done near the end of pregnancy if an early delivery is required. In this case, it is done to help make sure the baby's lungs are mature enough for the baby to live outside of the womb. CHANGES OCCURING IN THE SECOND TRIMESTER OF PREGNANCY Your body goes through many changes during pregnancy. They vary from person to person. Talk to your caregiver about changes you notice that you are concerned about.  During the second trimester, you will likely have an increase in your appetite. It is normal to have cravings for certain foods. This varies from person to person and pregnancy to pregnancy.   Your lower abdomen will begin to bulge.   You may have to urinate more often because the uterus and baby are pressing on your bladder. It  is also common to get more bladder infections during pregnancy (pain with urination). You can help this by drinking lots of fluids and emptying your bladder before and after intercourse.   You may begin to get stretch marks on your hips, abdomen, and breasts. These are normal changes in the body during pregnancy. There are no exercises or medications to take that prevent this change.   You may begin to develop swollen and bulging veins (varicose veins) in your legs. Wearing support hose, elevating your feet for 15 minutes, 3 to 4 times a day and limiting salt  in your diet helps lessen the problem.   Heartburn may develop as the uterus grows and pushes up against the stomach. Antacids recommended by your caregiver helps with this problem. Also, eating smaller meals 4 to 5 times a day helps.   Constipation can be treated with a stool softener or adding bulk to your diet. Drinking lots of fluids, vegetables, fruits, and whole grains are helpful.   Exercising is also helpful. If you have been very active up until your pregnancy, most of these activities can be continued during your pregnancy. If you have been less active, it is helpful to start an exercise program such as walking.   Hemorrhoids (varicose veins in the rectum) may develop at the end of the second trimester. Warm sitz baths and hemorrhoid cream recommended by your caregiver helps hemorrhoid problems.   Backaches may develop during this time of your pregnancy. Avoid heavy lifting, wear low heal shoes and practice good posture to help with backache problems.   Some pregnant women develop tingling and numbness of their hand and fingers because of swelling and tightening of ligaments in the wrist (carpel tunnel syndrome). This goes away after the baby is born.   As your breasts enlarge, you may have to get a bigger bra. Get a comfortable, cotton, support bra. Do not get a nursing bra until the last month of the pregnancy if you will be nursing the baby.   You may get a dark line from your belly button to the pubic area called the linea nigra.   You may develop rosy cheeks because of increase blood flow to the face.   You may develop spider looking lines of the face, neck, arms and chest. These go away after the baby is born.  HOME CARE INSTRUCTIONS   It is extremely important to avoid all smoking, herbs, alcohol, and unprescribed drugs during your pregnancy. These chemicals affect the formation and growth of the baby. Avoid these chemicals throughout the pregnancy to ensure the delivery of a  healthy infant.   Most of your home care instructions are the same as suggested for the first trimester of your pregnancy. Keep your caregiver's appointments. Follow your caregiver's instructions regarding medication use, exercise and diet.   During pregnancy, you are providing food for you and your baby. Continue to eat regular, well-balanced meals. Choose foods such as meat, fish, milk and other low fat dairy products, vegetables, fruits, and whole-grain breads and cereals. Your caregiver will tell you of the ideal weight gain.   A physical sexual relationship may be continued up until near the end of pregnancy if there are no other problems. Problems could include early (premature) leaking of amniotic fluid from the membranes, vaginal bleeding, abdominal pain, or other medical or pregnancy problems.   Exercise regularly if there are no restrictions. Check with your caregiver if you are unsure of the safety of some of  your exercises. The greatest weight gain will occur in the last 2 trimesters of pregnancy. Exercise will help you:   Control your weight.   Get you in shape for labor and delivery.   Lose weight after you have the baby.   Wear a good support or jogging bra for breast tenderness during pregnancy. This may help if worn during sleep. Pads or tissues may be used in the bra if you are leaking colostrum.   Do not use hot tubs, steam rooms or saunas throughout the pregnancy.   Wear your seat belt at all times when driving. This protects you and your baby if you are in an accident.   Avoid raw meat, uncooked cheese, cat litter boxes and soil used by cats. These carry germs that can cause birth defects in the baby.   The second trimester is also a good time to visit your dentist for your dental health if this has not been done yet. Getting your teeth cleaned is OK. Use a soft toothbrush. Brush gently during pregnancy.   It is easier to loose urine during pregnancy. Tightening up and  strengthening the pelvic muscles will help with this problem. Practice stopping your urination while you are going to the bathroom. These are the same muscles you need to strengthen. It is also the muscles you would use as if you were trying to stop from passing gas. You can practice tightening these muscles up 10 times a set and repeating this about 3 times per day. Once you know what muscles to tighten up, do not perform these exercises during urination. It is more likely to contribute to an infection by backing up the urine.   Ask for help if you have financial, counseling or nutritional needs during pregnancy. Your caregiver will be able to offer counseling for these needs as well as refer you for other special needs.   Your skin may become oily. If so, wash your face with mild soap, use non-greasy moisturizer and oil or cream based makeup.  MEDICATIONS AND DRUG USE IN PREGNANCY  Take prenatal vitamins as directed. The vitamin should contain 1 milligram of folic acid. Keep all vitamins out of reach of children. Only a couple vitamins or tablets containing iron may be fatal to a baby or young child when ingested.   Avoid use of all medications, including herbs, over-the-counter medications, not prescribed or suggested by your caregiver. Only take over-the-counter or prescription medicines for pain, discomfort, or fever as directed by your caregiver. Do not use aspirin.   Let your caregiver also know about herbs you may be using.   Alcohol is related to a number of birth defects. This includes fetal alcohol syndrome. All alcohol, in any form, should be avoided completely. Smoking will cause low birth rate and premature babies.   Street or illegal drugs are very harmful to the baby. They are absolutely forbidden. A baby born to an addicted mother will be addicted at birth. The baby will go through the same withdrawal an adult does.  SEEK MEDICAL CARE IF:  You have any concerns or worries during  your pregnancy. It is better to call with your questions if you feel they cannot wait, rather than worry about them. SEEK IMMEDIATE MEDICAL CARE IF:   An unexplained oral temperature above 102 F (38.9 C) develops, or as your caregiver suggests.   You have leaking of fluid from the vagina (birth canal). If leaking membranes are suspected, take your temperature and tell  your caregiver of this when you call.   There is vaginal spotting, bleeding, or passing clots. Tell your caregiver of the amount and how many pads are used. Light spotting in pregnancy is common, especially following intercourse.   You develop a bad smelling vaginal discharge with a change in the color from clear to white.   You continue to feel sick to your stomach (nauseated) and have no relief from remedies suggested. You vomit blood or coffee ground-like materials.   You lose more than 2 pounds of weight or gain more than 2 pounds of weight over 1 week, or as suggested by your caregiver.   You notice swelling of your face, hands, feet, or legs.   You get exposed to [place] measles and have never had them.   You are exposed to fifth disease or chickenpox.   You develop belly (abdominal) pain. Round ligament discomfort is a common non-cancerous (benign) cause of abdominal pain in pregnancy. Your caregiver still must evaluate you.   You develop a bad headache that does not go away.   You develop fever, diarrhea, pain with urination, or shortness of breath.   You develop visual problems, blurry, or double vision.   You fall or are in a car accident or any kind of trauma.   There is mental or physical violence at home.  Document Released: 12/24/2000 Document Revised: 09/11/2010 Document Reviewed: 06/28/2008 [facility] Patient Information 2012 [place], [place].

## 2010-11-05 NOTE — Progress Notes (Signed)
   Subjective:    Rachel Lloyd is a 25 y.o. female being seen today for her obstetrical visit. She is at [redacted]w[redacted]d gestation. Patient reports no complaints. Fetal movement: normal.  Menstrual History: OB History    Grav Para Term Preterm Abortions TAB SAB Ect Mult Living   3 1 1  0 1 1 0 0 0 1       No LMP recorded. Patient is pregnant. Patient does not remember LMP - she has a hx of irregular periods.    The following portions of the patient's history were reviewed and updated as appropriate: allergies, current medications, past family history, past medical history and problem list.  Review of Systems Pertinent items are noted in HPI.   Objective:     BP 114/74  Temp 98.4 F (36.9 C)  Wt 143 lb 11.2 oz (65.182 kg)   General: in NAD CVASC: RRR. No M RESP: CTAB ABDO: soft, gravid, NT, ND, +BS MSK: no edema, full ROM FHT: 160s FH: 21  Assessment:    Pregnancy 18 and 1/7 weeks   Plan:    Problem list reviewed and updated.  Labs reviewed. PHQ score: 8 PMH form completed, concerning for bipolar disorder (off meds) and late to [facility]. Quad screen discussed: ordered. Anatomy US: ordered. Patient to schedule lab visit for early 1 hr GTT (mother recently diagnosed with DM). Kick counts and preterm labor precautions reviewed. Amniocentesis discussed: not indicated. Patient to follow up in 4 weeks with me and lab visit for GTT.

## 2010-11-11 ENCOUNTER — Ambulatory Visit (HOSPITAL_COMMUNITY)
Admission: RE | Admit: 2010-11-11 | Discharge: 2010-11-11 | Disposition: A | Discharge status: Home / Self Care | Payer: Medicaid Other | Source: Ambulatory Visit | Attending: Family Medicine | Admitting: Family Medicine

## 2010-11-11 DIAGNOSIS — Z331 Pregnant state, incidental: Secondary | ICD-10-CM

## 2010-11-11 DIAGNOSIS — O358XX0 Maternal care for other (suspected) fetal abnormality and damage, not applicable or unspecified: Secondary | ICD-10-CM | POA: Insufficient documentation

## 2010-11-11 DIAGNOSIS — Z1389 Encounter for screening for other disorder: Secondary | ICD-10-CM | POA: Insufficient documentation

## 2010-11-11 DIAGNOSIS — Z363 Encounter for antenatal screening for malformations: Secondary | ICD-10-CM | POA: Insufficient documentation

## 2010-11-12 ENCOUNTER — Other Ambulatory Visit: Payer: Medicaid Other

## 2010-11-12 ENCOUNTER — Other Ambulatory Visit: Payer: Self-pay | Admitting: Family Medicine

## 2010-11-12 LAB — GLUCOSE, CAPILLARY: Glucose-Capillary: 119 mg/dL — ABNORMAL HIGH (ref 70–99)

## 2010-11-12 NOTE — Progress Notes (Signed)
1 HR GLUCOSE = 119 MG/DL BAJORDAN, MLS

## 2010-11-14 NOTE — Progress Notes (Signed)
Note reviewed.  Rachel Lloyd

## 2010-11-18 NOTE — ED Provider Notes (Signed)
Agree with above note.  Rachel Lloyd 11/18/2010 11:06 AM

## 2010-11-28 ENCOUNTER — Telehealth: Payer: Self-pay | Admitting: Family Medicine

## 2010-11-28 NOTE — Telephone Encounter (Signed)
5 mo OB having sharp pains in lower abd - since last night

## 2010-11-28 NOTE — Telephone Encounter (Signed)
Spoke with patient and she states she is now sitting in tub of warm water and discomfort is better, she rates pain as 3 out of 10. Pain was worse when she first called.  Having cramping. No bleeding. Advised her to go to [facility] for evaluation just to be sure.

## 2010-12-03 ENCOUNTER — Ambulatory Visit (INDEPENDENT_AMBULATORY_CARE_PROVIDER_SITE_OTHER): Payer: Medicaid Other | Admitting: Family Medicine

## 2010-12-03 VITALS — BP 113/72 | Temp 99.2°F | Wt 148.6 lb

## 2010-12-03 DIAGNOSIS — Z3480 Encounter for supervision of other normal pregnancy, unspecified trimester: Secondary | ICD-10-CM

## 2010-12-03 DIAGNOSIS — N760 Acute vaginitis: Secondary | ICD-10-CM

## 2010-12-03 DIAGNOSIS — Z3490 Encounter for supervision of normal pregnancy, unspecified, unspecified trimester: Secondary | ICD-10-CM

## 2010-12-03 LAB — POCT WET PREP (WET MOUNT)
Clue Cells Wet Prep HPF POC: NEGATIVE
Trichomonas Wet Prep HPF POC: NEGATIVE
Yeast Wet Prep HPF POC: NEGATIVE

## 2010-12-03 MED ORDER — ACETAMINOPHEN 500 MG PO TABS: 500.0000 mg | ORAL_TABLET | Freq: Four times a day (QID) | ORAL | Status: AC | PRN

## 2010-12-03 MED ORDER — TRIAMCINOLONE ACETONIDE 0.025 % EX OINT: TOPICAL_OINTMENT | Freq: Two times a day (BID) | CUTANEOUS | Status: DC

## 2010-12-03 NOTE — Progress Notes (Signed)
Patient complains of low back pain and vaginal discharge. Low back pain is intermittent and usually occurs after lying down for a long time.  She has not taken anything for it because she did not know what was safe in pregnancy.  Vaginal discharge is thick and white, describes it as "egg" consistency.  Denies any dysuria, burning with urination, vaginal itching, vaginal bleeding.  No fevers, chills, NS, vomiting or nausea.  Still eating well, no change in appetite.  Also complains of dry skin and rash on left hand between fourth and fifth fingers that is itchy.    Physical exam: Skin: red, dry lesion on left hand between 4th and 5th fingers GU: white, thick vaginal discharge, normal cervix, no tenderness or erythema noted  A/P: Will order wet prep, GC/Chlamydia today. Wrote Rx for Tylenol 500 mg  PRN and recommended heating pads, sleeping on side from now on. For rash, will give Rx for triamcinolone.  Does not appear to be scabies, likely contact dermatitis. Reviewed preterm labor precautions and kick counts. Plans to breast-feed and use Depo for [facility]. Follow up in 4 weeks w/ Dr. Mauricio Po or Dr. [place] in [facility] clinic.

## 2010-12-03 NOTE — Patient Instructions (Addendum)
It was nice to see you today.  I will call or send you a letter with lab results. Please schedule a follow up appointment with [facility] (Dr. [place] or Dr. Mauricio Po) in 4 weeks. If they are not available, please schedule appointment with Dr. Tye Savoy. For back pain, you may take Tylenol and use heating pads three times per day.  Pregnancy - Second Trimester The second trimester of pregnancy (3 to 6 months) is a period of rapid growth for you and your baby. At the end of the sixth month, your baby is about 9 inches long and weighs 1 1/2 pounds. You will begin to feel the baby move between 18 and 20 weeks of the pregnancy. This is called quickening. Weight gain is faster. A clear fluid (colostrum) may leak out of your breasts. You may feel small contractions of the womb (uterus). This is known as false labor or Braxton-Hicks contractions. This is like a practice for labor when the baby is ready to be born. Usually, the problems with morning sickness have usually passed by the end of your first trimester. Some women develop small dark blotches (called cholasma, mask of pregnancy) on their face that usually goes away after the baby is born. Exposure to the sun makes the blotches worse. Acne may also develop in some pregnant women and pregnant women who have acne, may find that it goes away. PRENATAL EXAMS  Blood work may continue to be done during prenatal exams. These tests are done to check on your health and the probable health of your baby. Blood work is used to follow your blood levels (hemoglobin). Anemia (low hemoglobin) is common during pregnancy. Iron and vitamins are given to help prevent this. You will also be checked for diabetes between 24 and 28 weeks of the pregnancy. Some of the previous blood tests may be repeated.   The size of the uterus is measured during each visit. This is to make sure that the baby is continuing to grow properly according to the dates of the pregnancy.   Your blood  pressure is checked every prenatal visit. This is to make sure you are not getting toxemia.   Your urine is checked to make sure you do not have an infection, diabetes or protein in the urine.   Your weight is checked often to make sure gains are happening at the suggested rate. This is to ensure that both you and your baby are growing normally.   Sometimes, an ultrasound is performed to confirm the proper growth and development of the baby. This is a test which bounces harmless sound waves off the baby so your caregiver can more accurately determine due dates.  Sometimes, a specialized test is done on the amniotic fluid surrounding the baby. This test is called an amniocentesis. The amniotic fluid is obtained by sticking a needle into the belly (abdomen). This is done to check the chromosomes in instances where there is a concern about possible genetic problems with the baby. It is also sometimes done near the end of pregnancy if an early delivery is required. In this case, it is done to help make sure the baby's lungs are mature enough for the baby to live outside of the womb. CHANGES OCCURING IN THE SECOND TRIMESTER OF PREGNANCY Your body goes through many changes during pregnancy. They vary from person to person. Talk to your caregiver about changes you notice that you are concerned about.  During the second trimester, you will  likely have an increase in your appetite. It is normal to have cravings for certain foods. This varies from person to person and pregnancy to pregnancy.   Your lower abdomen will begin to bulge.   You may have to urinate more often because the uterus and baby are pressing on your bladder. It is also common to get more bladder infections during pregnancy (pain with urination). You can help this by drinking lots of fluids and emptying your bladder before and after intercourse.   You may begin to get stretch marks on your hips, abdomen, and breasts. These are normal changes  in the body during pregnancy. There are no exercises or medications to take that prevent this change.   You may begin to develop swollen and bulging veins (varicose veins) in your legs. Wearing support hose, elevating your feet for 15 minutes, 3 to 4 times a day and limiting salt in your diet helps lessen the problem.   Heartburn may develop as the uterus grows and pushes up against the stomach. Antacids recommended by your caregiver helps with this problem. Also, eating smaller meals 4 to 5 times a day helps.   Constipation can be treated with a stool softener or adding bulk to your diet. Drinking lots of fluids, vegetables, fruits, and whole grains are helpful.   Exercising is also helpful. If you have been very active up until your pregnancy, most of these activities can be continued during your pregnancy. If you have been less active, it is helpful to start an exercise program such as walking.   Hemorrhoids (varicose veins in the rectum) may develop at the end of the second trimester. Warm sitz baths and hemorrhoid cream recommended by your caregiver helps hemorrhoid problems.   Backaches may develop during this time of your pregnancy. Avoid heavy lifting, wear low heal shoes and practice good posture to help with backache problems.   Some pregnant women develop tingling and numbness of their hand and fingers because of swelling and tightening of ligaments in the wrist (carpel tunnel syndrome). This goes away after the baby is born.   As your breasts enlarge, you may have to get a bigger bra. Get a comfortable, cotton, support bra. Do not get a nursing bra until the last month of the pregnancy if you will be nursing the baby.   You may get a dark line from your belly button to the pubic area called the linea nigra.   You may develop rosy cheeks because of increase blood flow to the face.   You may develop spider looking lines of the face, neck, arms and chest. These go away after the baby  is born.  HOME CARE INSTRUCTIONS   It is extremely important to avoid all smoking, herbs, alcohol, and unprescribed drugs during your pregnancy. These chemicals affect the formation and growth of the baby. Avoid these chemicals throughout the pregnancy to ensure the delivery of a healthy infant.   Most of your home care instructions are the same as suggested for the first trimester of your pregnancy. Keep your caregiver's appointments. Follow your caregiver's instructions regarding medication use, exercise and diet.   During pregnancy, you are providing food for you and your baby. Continue to eat regular, well-balanced meals. Choose foods such as meat, fish, milk and other low fat dairy products, vegetables, fruits, and whole-grain breads and cereals. Your caregiver will tell you of the ideal weight gain.   A physical sexual relationship may be continued up until near  the end of pregnancy if there are no other problems. Problems could include early (premature) leaking of amniotic fluid from the membranes, vaginal bleeding, abdominal pain, or other medical or pregnancy problems.   Exercise regularly if there are no restrictions. Check with your caregiver if you are unsure of the safety of some of your exercises. The greatest weight gain will occur in the last 2 trimesters of pregnancy. Exercise will help you:   Control your weight.   Get you in shape for labor and delivery.   Lose weight after you have the baby.   Wear a good support or jogging bra for breast tenderness during pregnancy. This may help if worn during sleep. Pads or tissues may be used in the bra if you are leaking colostrum.   Do not use hot tubs, steam rooms or saunas throughout the pregnancy.   Wear your seat belt at all times when driving. This protects you and your baby if you are in an accident.   Avoid raw meat, uncooked cheese, cat litter boxes and soil used by cats. These carry germs that can cause birth defects in the  baby.   The second trimester is also a good time to visit your dentist for your dental health if this has not been done yet. Getting your teeth cleaned is OK. Use a soft toothbrush. Brush gently during pregnancy.   It is easier to loose urine during pregnancy. Tightening up and strengthening the pelvic muscles will help with this problem. Practice stopping your urination while you are going to the bathroom. These are the same muscles you need to strengthen. It is also the muscles you would use as if you were trying to stop from passing gas. You can practice tightening these muscles up 10 times a set and repeating this about 3 times per day. Once you know what muscles to tighten up, do not perform these exercises during urination. It is more likely to contribute to an infection by backing up the urine.   Ask for help if you have financial, counseling or nutritional needs during pregnancy. Your caregiver will be able to offer counseling for these needs as well as refer you for other special needs.   Your skin may become oily. If so, wash your face with mild soap, use non-greasy moisturizer and oil or cream based makeup.  MEDICATIONS AND DRUG USE IN PREGNANCY  Take prenatal vitamins as directed. The vitamin should contain 1 milligram of folic acid. Keep all vitamins out of reach of children. Only a couple vitamins or tablets containing iron may be fatal to a baby or young child when ingested.   Avoid use of all medications, including herbs, over-the-counter medications, not prescribed or suggested by your caregiver. Only take over-the-counter or prescription medicines for pain, discomfort, or fever as directed by your caregiver. Do not use aspirin.   Let your caregiver also know about herbs you may be using.   Alcohol is related to a number of birth defects. This includes fetal alcohol syndrome. All alcohol, in any form, should be avoided completely. Smoking will cause low birth rate and premature  babies.   Street or illegal drugs are very harmful to the baby. They are absolutely forbidden. A baby born to an addicted mother will be addicted at birth. The baby will go through the same withdrawal an adult does.  SEEK MEDICAL CARE IF:  You have any concerns or worries during your pregnancy. It is better to call with your questions  if you feel they cannot wait, rather than worry about them. SEEK IMMEDIATE MEDICAL CARE IF:   An unexplained oral temperature above 102 F (38.9 C) develops, or as your caregiver suggests.   You have leaking of fluid from the vagina (birth canal). If leaking membranes are suspected, take your temperature and tell your caregiver of this when you call.   There is vaginal spotting, bleeding, or passing clots. Tell your caregiver of the amount and how many pads are used. Light spotting in pregnancy is common, especially following intercourse.   You develop a bad smelling vaginal discharge with a change in the color from clear to white.   You continue to feel sick to your stomach (nauseated) and have no relief from remedies suggested. You vomit blood or coffee ground-like materials.   You lose more than 2 pounds of weight or gain more than 2 pounds of weight over 1 week, or as suggested by your caregiver.   You notice swelling of your face, hands, feet, or legs.   You get exposed to [place] measles and have never had them.   You are exposed to fifth disease or chickenpox.   You develop belly (abdominal) pain. Round ligament discomfort is a common non-cancerous (benign) cause of abdominal pain in pregnancy. Your caregiver still must evaluate you.   You develop a bad headache that does not go away.   You develop fever, diarrhea, pain with urination, or shortness of breath.   You develop visual problems, blurry, or double vision.   You fall or are in a car accident or any kind of trauma.   There is mental or physical violence at home.  Document Released:  12/24/2000 Document Revised: 09/11/2010 Document Reviewed: 06/28/2008 [facility] Patient Information 2012 [place], [place].

## 2010-12-04 LAB — GC/CHLAMYDIA PROBE AMP, GENITAL
Chlamydia, DNA Probe: NEGATIVE
GC Probe Amp, Genital: NEGATIVE

## 2010-12-09 ENCOUNTER — Telehealth: Payer: Self-pay | Admitting: Family Medicine

## 2010-12-09 NOTE — Telephone Encounter (Signed)
Pt there now for a dental emergency, we faxed a consent stating pt can be seen for a root canal but they need to do an xray & pt is pregnant, need an ok to do xray asap

## 2010-12-09 NOTE — Telephone Encounter (Signed)
Fleet Contras @ dentist office notified patient may have x-rays as long as her abdomen is shielded.  Consent written by Dr. Gwendolyn Grant  faxed to office.  Ileana Ladd

## 2011-01-02 ENCOUNTER — Ambulatory Visit (INDEPENDENT_AMBULATORY_CARE_PROVIDER_SITE_OTHER): Payer: Medicaid Other | Admitting: Family Medicine

## 2011-01-02 ENCOUNTER — Encounter: Payer: Self-pay | Admitting: Family Medicine

## 2011-01-02 VITALS — BP 108/73 | HR 94 | Temp 98.2°F | Ht 62.0 in | Wt 154.0 lb

## 2011-01-02 DIAGNOSIS — B373 Candidiasis of vulva and vagina: Secondary | ICD-10-CM

## 2011-01-02 DIAGNOSIS — N898 Other specified noninflammatory disorders of vagina: Secondary | ICD-10-CM

## 2011-01-02 DIAGNOSIS — O26899 Other specified pregnancy related conditions, unspecified trimester: Secondary | ICD-10-CM

## 2011-01-02 DIAGNOSIS — L293 Anogenital pruritus, unspecified: Secondary | ICD-10-CM

## 2011-01-02 LAB — POCT WET PREP (WET MOUNT)
Clue Cells Wet Prep HPF POC: NEGATIVE
Trichomonas Wet Prep HPF POC: NEGATIVE
WBC, Wet Prep HPF POC: >20
Yeast Wet Prep HPF POC: NEGATIVE

## 2011-01-02 MED ORDER — NYSTATIN 100000 UNITS VA TABS: 1.0000 | ORAL_TABLET | Freq: Every day | VAGINAL | Status: AC

## 2011-01-02 NOTE — Progress Notes (Signed)
S:  Pt is here for evaluation of vaginal discharge. Pt states that this has been a recurrent issue. Pt is noted to hav multiple wet preps over the last 1-2 months. These have all been tentatively negative. Pt reports recurrent vaginal discharge and vaginal itching. Pt denies any recent sexual activity. Discharge is intermittently foul smelling. Pt denies any past hx/o DM in the past. Most recent early 1 hour gtt WNL. No abd pain, vaginal bleeiding. No dysuria.       O:  Current outpatient prescriptions:nystatin 100000 UNITS vaginal tablet, Place 1 tablet vaginally at bedtime., Disp: 15 tablet, Rfl: 0;  nystatin-triamcinolone (MYCOLOG) ointment, Apply topically 2 (two) times daily., Disp: 30 g, Rfl: 0;  Prenatal Vit-Fe Fumarate-FA (PRENATAL MULTIVITAMIN) 60-1 MG tablet, Take 1 tablet by mouth daily., Disp: 30 tablet, Rfl: 11 promethazine (PHENERGAN) 25 MG tablet, Take 25 mg by mouth every 6 (six) hours as needed. For nausea , Disp: , Rfl: ;  triamcinolone (KENALOG) 0.025 % ointment, Apply topically 2 (two) times daily., Disp: 30 g, Rfl: 0  Wt Readings from Last 3 Encounters:  01/02/11 154 lb (69.854 kg)  12/03/10 148 lb 9.6 oz (67.405 kg)  11/05/10 143 lb 11.2 oz (65.182 kg)   Temp Readings from Last 3 Encounters:  01/02/11 98.2 F (36.8 C) Oral  12/03/10 99.2 F (37.3 C)   11/05/10 98.4 F (36.9 C)    BP Readings from Last 3 Encounters:  01/02/11 108/73  12/03/10 113/72  11/05/10 114/74   Pulse Readings from Last 3 Encounters:  01/02/11 94  09/17/10 99  08/25/10 93    Gen: up in chair, NAD HEENT: NCAT, EOMI, TMs clear bilaterally CV: RRR, no murmurs auscultated PULM: CTAB, no wheezes, rales, rhoncii ABD: S/NT/+ bowel sounds  GU: normal external genitlia, copious amounts of vaginal discharge, cervical os visible. No CMT EXT: 2+ peripheral pulses   A/P:

## 2011-01-02 NOTE — Patient Instructions (Signed)
Candidal Vulvovaginitis Candidal vulvovaginitis is an infection of the vagina and vulva. The vulva is the skin around the opening of the vagina. This may cause itching and discomfort in and around the vagina.  HOME CARE  Only take medicine as told by your doctor.   Do not have sex (intercourse) until the infection is healed or as told by your doctor.   Practice safe sex.   Tell your sex partner about your infection.   Do not douche or use tampons.   Wear cotton underwear. Do not wear tight pants or panty hose.   Eat yogurt. This may help treat and prevent yeast infections.  GET HELP RIGHT AWAY IF:   You have a fever.   Your problems get worse during treatment or do not get better in 3 days.   You have discomfort, irritation, or itching in your vagina or vulva area.   You have pain after sex.   You start to get belly (abdominal) pain.  MAKE SURE YOU:  Understand these instructions.   Will watch your condition.   Will get help right away if you are not doing well or get worse.  Document Released: 03/28/2008 Document Revised: 09/11/2010 Document Reviewed: 03/28/2008 [facility] Patient Information 2012 [place], [place].

## 2011-01-08 DIAGNOSIS — N898 Other specified noninflammatory disorders of vagina: Secondary | ICD-10-CM | POA: Insufficient documentation

## 2011-01-08 NOTE — Assessment & Plan Note (Addendum)
Suspect this is likely fungal. Wet prep today. Will treat with nystatin vaginal suppositories. Pt reports ? Hx/o GDM/DM in the past. There may be underlying insulin resistance underlying mechanism for this (though early 1 hr gtt WNL). Pt has formal OB follow up in 1-2 weeks. Repeat 1hr gtt due at that time. Will follow prn.

## 2011-01-09 ENCOUNTER — Ambulatory Visit (INDEPENDENT_AMBULATORY_CARE_PROVIDER_SITE_OTHER): Payer: Medicaid Other | Admitting: Family Medicine

## 2011-01-09 DIAGNOSIS — Z331 Pregnant state, incidental: Secondary | ICD-10-CM

## 2011-01-09 LAB — HIV ANTIBODY (ROUTINE TESTING W REFLEX): HIV: NONREACTIVE

## 2011-01-09 LAB — GLUCOSE, CAPILLARY: Glucose-Capillary: 99 mg/dL (ref 70–99)

## 2011-01-09 LAB — CBC
HCT: 31.5 % — ABNORMAL LOW (ref 36.0–46.0)
Hemoglobin: 10.2 g/dL — ABNORMAL LOW (ref 12.0–15.0)
MCH: 28.9 pg (ref 26.0–34.0)
MCHC: 32.4 g/dL (ref 30.0–36.0)
MCV: 89.2 fL (ref 78.0–100.0)
Platelets: 259 10*3/uL (ref 150–400)
RBC: 3.53 MIL/uL — ABNORMAL LOW (ref 3.87–5.11)
RDW: 13.3 % (ref 11.5–15.5)
WBC: 6.7 10*3/uL (ref 4.0–10.5)

## 2011-01-09 LAB — RPR

## 2011-01-09 NOTE — Patient Instructions (Signed)
It was nice to meet you today. You can try the Vaseline 2-3 times a day for the itching.   If you have vaginal bleeding, if your water breaks, if you have contractions (more than 4 in one hour) or if the baby is not moving well, please go to [facility]. I would definitely suggest that you and everyone in your family get the whooping cough vaccine.  I am glad you are planning on getting it next visit. Please come back in 2 weeks for a visit with  Dr. Tye Savoy.
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
## 2011-01-10 ENCOUNTER — Telehealth: Payer: Self-pay | Admitting: Family Medicine

## 2011-01-10 NOTE — Telephone Encounter (Signed)
Spoke with pharmacy, Nystatin tablet no longer available, patient given another refill of nystatin-trimcinolone ointment.

## 2011-01-10 NOTE — Telephone Encounter (Signed)
Needs clarification on Nystatin.  They need it as soon as possible.  They do not dispense vaginal tablets.

## 2011-01-14 NOTE — L&D Delivery Note (Signed)
Delivery Note  At 0038, a viable female was delivered via vacuum assisted delivery.  (Presentation: vertex ;  OA ).  APGAR: 6, 8 ; weight 8 lb 2 oz.   Placenta status: intact, 3VC intact with the following complications: Cord pH: 7.099. Anesthesia:  Epidural Lacerations: 2nd degree laceration Suture Repair: 2.0 vicryl Est. Blood Loss (mL): < 400 cc  Mom to postpartum.  Baby to nursery-stable.  DE LA CRUZ,IVY 04/12/2011, 1:22 AM    Called by RN to see pt.:  Complete and pushing ineffectively x approx 3 hrs. VE: large caput, +2 , some movement with push, ?OP. Dr. Marice Potter called for consideration of VAVD. Counseled pt on risks including fetal injury or need for C/S an she desires to proceed. Kiwi applied and head descended well after 3-4 pulls, no pop-offs. After head emerged, I did the delivery. Loose nuchal cord reduced over head. Placenta spontaneous, long cord, sent to pathology. Uterine atony required manual extraction of clots and cytotec 1000 mcg pr. EBL 600. I did repair of 2nd degree vaginal sulcus laceration extending up right labium. Used local infiltration and 3-0 vicryl rapide.  Angle Dirusso 04/12/2011 2:25 AM

## 2011-01-27 ENCOUNTER — Ambulatory Visit (INDEPENDENT_AMBULATORY_CARE_PROVIDER_SITE_OTHER): Payer: Medicaid Other | Admitting: Family Medicine

## 2011-01-27 VITALS — BP 110/68 | Wt 160.0 lb

## 2011-01-27 DIAGNOSIS — Z3490 Encounter for supervision of normal pregnancy, unspecified, unspecified trimester: Secondary | ICD-10-CM

## 2011-01-27 DIAGNOSIS — Z3480 Encounter for supervision of other normal pregnancy, unspecified trimester: Secondary | ICD-10-CM

## 2011-01-27 NOTE — Progress Notes (Signed)
Patient presents to clinic @ 30 weeks for follow up.  Vaginal bleeding and itching have resolved.  Still endorses bilateral dry skin on breast (around areola), but Vaseline is helping.  Reports good support system at home - FOB is concerned that baby is not his but will support her regardless.  Reviewed recent labs with patient - all normal, except Hgb 10.2.   A/P: Psych disorder - seems stable.  Follow up PMH case manager.         Red flags - preterm labor precautions and kick counts reviewed.         Try using Aquaphor BID for skin dryness.           Birth control - undecided, probably Depo.          Follow up in 4 weeks.

## 2011-01-30 ENCOUNTER — Ambulatory Visit: Payer: Medicaid Other | Admitting: Family Medicine

## 2011-01-30 ENCOUNTER — Telehealth: Payer: Self-pay | Admitting: Family Medicine

## 2011-01-30 NOTE — Telephone Encounter (Signed)
Patient states she woke up with a red area about size of pin head or little larger  on eye at bottom of  the pupil. Feels irritated.  Consulted with Dr. Deirdre Priest and he advises  needs to be evaluated .  Appointment scheduled this afternoon.

## 2011-01-30 NOTE — Telephone Encounter (Signed)
Woke up this am with a blood clot in her eye - doesn't hurt, but it feels funny

## 2011-02-12 ENCOUNTER — Telehealth: Payer: Self-pay | Admitting: Family Medicine

## 2011-02-12 NOTE — Telephone Encounter (Signed)
Patient need a note from provider saying she need to have an apartment that is on one level.  Patient fell 2 days ago down steps and injured her back.  Having lot of back pain.  Also having bad muscle spasms.  Please call patient regarding this if there are any questions.

## 2011-02-18 ENCOUNTER — Encounter: Payer: Self-pay | Admitting: Family Medicine

## 2011-02-18 NOTE — Telephone Encounter (Signed)
Sent letter to white team to send to patient. Please let Vinetta know that a letter has been sent.  Thank you.

## 2011-02-20 ENCOUNTER — Telehealth: Payer: Self-pay | Admitting: Family Medicine

## 2011-02-20 NOTE — Telephone Encounter (Signed)
Rachel Lloyd, Patient came in wanting a letter so that she can return to work with no restrictions. Ishe wanted it today and I told her you were at hospital and may not be able to have a letter today.  She wanted another doctor to write for her if you were not here. I explained to her that only her PCP following her can write that

## 2011-02-20 NOTE — Telephone Encounter (Signed)
Pls fax note to [phone] ATTN: Suzette Battiest

## 2011-02-20 NOTE — Telephone Encounter (Signed)
Rachel Lloyd needs a work note from [employer], stating she has no restriction and can return.

## 2011-02-21 ENCOUNTER — Telehealth: Payer: Self-pay | Admitting: Family Medicine

## 2011-02-21 NOTE — Telephone Encounter (Signed)
Patient is calling back about a letter to return to work with no restrictions.  She needs this not today so she can go back to work tomorrow.

## 2011-02-21 NOTE — Telephone Encounter (Signed)
Was sne to that number by dr. Tye Savoy

## 2011-02-21 NOTE — Telephone Encounter (Signed)
Spoke with patient and informed her of the faxing of her work note

## 2011-02-21 NOTE — Telephone Encounter (Signed)
The fax # the note for work needs to go to is [phone] Attention Burdette.

## 2011-02-21 NOTE — Telephone Encounter (Signed)
Please call patient and let her know that I faxed a work note to the number given.  Thank you.

## 2011-02-24 ENCOUNTER — Encounter: Payer: Self-pay | Admitting: Family Medicine

## 2011-02-24 ENCOUNTER — Telehealth: Payer: Self-pay | Admitting: *Deleted

## 2011-02-24 NOTE — Telephone Encounter (Signed)
Patient came to office to pick up a letter stating that it ok for her to return to work with no restrictions. I spoke with Dr Tye Savoy and she ok'ed for me to write this letter.Rachel Lloyd

## 2011-02-28 ENCOUNTER — Ambulatory Visit (INDEPENDENT_AMBULATORY_CARE_PROVIDER_SITE_OTHER): Payer: Medicaid Other | Admitting: Family Medicine

## 2011-02-28 ENCOUNTER — Encounter: Payer: Medicaid Other | Admitting: Family Medicine

## 2011-02-28 VITALS — BP 108/68 | Temp 98.3°F | Wt 166.0 lb

## 2011-02-28 DIAGNOSIS — Z3480 Encounter for supervision of other normal pregnancy, unspecified trimester: Secondary | ICD-10-CM

## 2011-02-28 DIAGNOSIS — Z3490 Encounter for supervision of normal pregnancy, unspecified, unspecified trimester: Secondary | ICD-10-CM

## 2011-02-28 MED ORDER — FLUCONAZOLE 150 MG PO TABS: 150.0000 mg | ORAL_TABLET | Freq: Once | ORAL | Status: AC

## 2011-02-28 NOTE — Progress Notes (Signed)
Patient currently 34.4 weeks today.  Complains of white, thin discharge with foul odor. On exam, copious amounts of white, thick, cream discharge. Baby moving/kicking on exam as well.  Vertex per Butler. Plan: Treat with Diflucan x 1. Return to clinic in 2 weeks for cervical cultures. Kick counts, preterm labor precautions reviewed. Will discuss birth control, feeding, circumcision at next visit.

## 2011-02-28 NOTE — Patient Instructions (Signed)
Pregnancy - Third Trimester The third trimester of pregnancy (the last 3 months) is a period of the most rapid growth for you and your baby. The baby approaches a length of 20 inches and a weight of 6 to 10 pounds. The baby is adding on fat and getting ready for life outside your body. While inside, babies have periods of sleeping and waking, suck their thumbs, and hiccups. You can often feel small contractions of the uterus. This is false labor. It is also called Braxton-Hicks contractions. This is like a practice for labor. The usual problems in this stage of pregnancy include more difficulty breathing, swelling of the hands and feet from water retention, and having to urinate more often because of the uterus and baby pressing on your bladder.  PRENATAL EXAMS  Blood work may continue to be done during prenatal exams. These tests are done to check on your health and the probable health of your baby. Blood work is used to follow your blood levels (hemoglobin). Anemia (low hemoglobin) is common during pregnancy. Iron and vitamins are given to help prevent this. You may also continue to be checked for diabetes. Some of the past blood tests may be done again.   The size of the uterus is measured during each visit. This makes sure your baby is growing properly according to your pregnancy dates.   Your blood pressure is checked every prenatal visit. This is to make sure you are not getting toxemia.   Your urine is checked every prenatal visit for infection, diabetes and protein.   Your weight is checked at each visit. This is done to make sure gains are happening at the suggested rate and that you and your baby are growing normally.   Sometimes, an ultrasound is performed to confirm the position and the proper growth and development of the baby. This is a test done that bounces harmless sound waves off the baby so your caregiver can more accurately determine due dates.   Discuss the type of pain  medication and anesthesia you will have during your labor and delivery.   Discuss the possibility and anesthesia if a Cesarean Section might be necessary.   Inform your caregiver if there is any mental or physical violence at home.  Sometimes, a specialized non-stress test, contraction stress test and biophysical profile are done to make sure the baby is not having a problem. Checking the amniotic fluid surrounding the baby is called an amniocentesis. The amniotic fluid is removed by sticking a needle into the belly (abdomen). This is sometimes done near the end of pregnancy if an early delivery is required. In this case, it is done to help make sure the baby's lungs are mature enough for the baby to live outside of the womb. If the lungs are not mature and it is unsafe to deliver the baby, an injection of cortisone medication is given to the mother 1 to 2 days before the delivery. This helps the baby's lungs mature and makes it safer to deliver the baby. CHANGES OCCURING IN THE THIRD TRIMESTER OF PREGNANCY Your body goes through many changes during pregnancy. They vary from person to person. Talk to your caregiver about changes you notice and are concerned about.  During the last trimester, you have probably had an increase in your appetite. It is normal to have cravings for certain foods. This varies from person to person and pregnancy to pregnancy.   You may begin to get stretch marks on your hips,   abdomen, and breasts. These are normal changes in the body during pregnancy. There are no exercises or medications to take which prevent this change.   Constipation may be treated with a stool softener or adding bulk to your diet. Drinking lots of fluids, fiber in vegetables, fruits, and whole grains are helpful.   Exercising is also helpful. If you have been very active up until your pregnancy, most of these activities can be continued during your pregnancy. If you have been less active, it is helpful  to start an exercise program such as walking. Consult your caregiver before starting exercise programs.   Avoid all smoking, alcohol, un-prescribed drugs, herbs and "street drugs" during your pregnancy. These chemicals affect the formation and growth of the baby. Avoid chemicals throughout the pregnancy to ensure the delivery of a healthy infant.   Backache, varicose veins and hemorrhoids may develop or get worse.   You will tire more easily in the third trimester, which is normal.   The baby's movements may be stronger and more often.   You may become short of breath easily.   Your belly button may stick out.   A yellow discharge may leak from your breasts called colostrum.   You may have a bloody mucus discharge. This usually occurs a few days to a week before labor begins.  HOME CARE INSTRUCTIONS   Keep your caregiver's appointments. Follow your caregiver's instructions regarding medication use, exercise, and diet.   During pregnancy, you are providing food for you and your baby. Continue to eat regular, well-balanced meals. Choose foods such as meat, fish, milk and other low fat dairy products, vegetables, fruits, and whole-grain breads and cereals. Your caregiver will tell you of the ideal weight gain.   A physical sexual relationship may be continued throughout pregnancy if there are no other problems such as early (premature) leaking of amniotic fluid from the membranes, vaginal bleeding, or belly (abdominal) pain.   Exercise regularly if there are no restrictions. Check with your caregiver if you are unsure of the safety of your exercises. Greater weight gain will occur in the last 2 trimesters of pregnancy. Exercising helps:   Control your weight.   Get you in shape for labor and delivery.   You lose weight after you deliver.   Rest a lot with legs elevated, or as needed for leg cramps or low back pain.   Wear a good support or jogging bra for breast tenderness during  pregnancy. This may help if worn during sleep. Pads or tissues may be used in the bra if you are leaking colostrum.   Do not use hot tubs, steam rooms, or saunas.   Wear your seat belt when driving. This protects you and your baby if you are in an accident.   Avoid raw meat, cat litter boxes and soil used by cats. These carry germs that can cause birth defects in the baby.   It is easier to loose urine during pregnancy. Tightening up and strengthening the pelvic muscles will help with this problem. You can practice stopping your urination while you are going to the bathroom. These are the same muscles you need to strengthen. It is also the muscles you would use if you were trying to stop from passing gas. You can practice tightening these muscles up 10 times a set and repeating this about 3 times per day. Once you know what muscles to tighten up, do not perform these exercises during urination. It is more likely   to cause an infection by backing up the urine.   Ask for help if you have financial, counseling or nutritional needs during pregnancy. Your caregiver will be able to offer counseling for these needs as well as refer you for other special needs.   Make a list of emergency phone numbers and have them available.   Plan on getting help from family or friends when you go home from the hospital.   Make a trial run to the hospital.   Take prenatal classes with the father to understand, practice and ask questions about the labor and delivery.   Prepare the baby's room/nursery.   Do not travel out of the city unless it is absolutely necessary and with the advice of your caregiver.   Wear only low or no heal shoes to have better balance and prevent falling.  MEDICATIONS AND DRUG USE IN PREGNANCY  Take prenatal vitamins as directed. The vitamin should contain 1 milligram of folic acid. Keep all vitamins out of reach of children. Only a couple vitamins or tablets containing iron may be fatal  to a baby or young child when ingested.   Avoid use of all medications, including herbs, over-the-counter medications, not prescribed or suggested by your caregiver. Only take over-the-counter or prescription medicines for pain, discomfort, or fever as directed by your caregiver. Do not use aspirin, ibuprofen (Motrin, Advil, Nuprin) or naproxen (Aleve) unless OK'd by your caregiver.   Let your caregiver also know about herbs you may be using.   Alcohol is related to a number of birth defects. This includes fetal alcohol syndrome. All alcohol, in any form, should be avoided completely. Smoking will cause low birth rate and premature babies.   Street/illegal drugs are very harmful to the baby. They are absolutely forbidden. A baby born to an addicted mother will be addicted at birth. The baby will go through the same withdrawal an adult does.  SEEK MEDICAL CARE IF: You have any concerns or worries during your pregnancy. It is better to call with your questions if you feel they cannot wait, rather than worry about them. DECISIONS ABOUT CIRCUMCISION You may or may not know the sex of your baby. If you know your baby is a boy, it may be time to think about circumcision. Circumcision is the removal of the foreskin of the penis. This is the skin that covers the sensitive end of the penis. There is no proven medical need for this. Often this decision is made on what is popular at the time or based upon religious beliefs and social issues. You can discuss these issues with your caregiver or pediatrician. SEEK IMMEDIATE MEDICAL CARE IF:   An unexplained oral temperature above 102 F (38.9 C) develops, or as your caregiver suggests.   You have leaking of fluid from the vagina (birth canal). If leaking membranes are suspected, take your temperature and tell your caregiver of this when you call.   There is vaginal spotting, bleeding or passing clots. Tell your caregiver of the amount and how many pads are  used.   You develop a bad smelling vaginal discharge with a change in the color from clear to white.   You develop vomiting that lasts more than 24 hours.   You develop chills or fever.   You develop shortness of breath.   You develop burning on urination.   You loose more than 2 pounds of weight or gain more than 2 pounds of weight or as suggested by your   caregiver.   You notice sudden swelling of your face, hands, and feet or legs.   You develop belly (abdominal) pain. Round ligament discomfort is a common non-cancerous (benign) cause of abdominal pain in pregnancy. Your caregiver still must evaluate you.   You develop a severe headache that does not go away.   You develop visual problems, blurred or double vision.   If you have not felt your baby move for more than 1 hour. If you think the baby is not moving as much as usual, eat something with sugar in it and lie down on your left side for an hour. The baby should move at least 4 to 5 times per hour. Call right away if your baby moves less than that.   You fall, are in a car accident or any kind of trauma.   There is mental or physical violence at home.  Document Released: 12/24/2000 Document Revised: 09/11/2010 Document Reviewed: 06/28/2008 [employer] Patient Information 2012 [employer]. 

## 2011-03-14 ENCOUNTER — Ambulatory Visit (INDEPENDENT_AMBULATORY_CARE_PROVIDER_SITE_OTHER): Payer: Medicaid Other | Admitting: Family Medicine

## 2011-03-14 ENCOUNTER — Other Ambulatory Visit (HOSPITAL_COMMUNITY)
Admission: RE | Admit: 2011-03-14 | Discharge: 2011-03-14 | Disposition: A | Discharge status: Home / Self Care | Payer: Medicaid Other | Source: Ambulatory Visit | Attending: Family Medicine | Admitting: Family Medicine

## 2011-03-14 VITALS — BP 118/68 | Temp 98.3°F | Wt 169.0 lb

## 2011-03-14 DIAGNOSIS — Z113 Encounter for screening for infections with a predominantly sexual mode of transmission: Secondary | ICD-10-CM | POA: Insufficient documentation

## 2011-03-14 DIAGNOSIS — Z331 Pregnant state, incidental: Secondary | ICD-10-CM

## 2011-03-14 LAB — POCT WET PREP (WET MOUNT)

## 2011-03-14 LAB — STREP B DNA PROBE: GBS: NEGATIVE

## 2011-03-14 NOTE — Patient Instructions (Signed)
IF you develop vaginal bleeding, gush of fluid, regular contractions (every 5-10 minutes), please go to [facility] If you feel like baby has stopped kicking, lay down for one hour and count kicks or movements.  IF less than 10 kicks in an hour, [facility] Return to clinic in 1 week.  Normal Labor and Delivery Your caregiver must first be sure you are in labor. Signs of labor include:  You may pass what is called "the mucus plug" before labor begins. This is a small amount of blood stained mucus.   Regular uterine contractions.   The time between contractions get closer together.   The discomfort and pain gradually gets more intense.   Pains are mostly located in the back.   Pains get worse when walking.   The cervix (the opening of the uterus becomes thinner (begins to efface) and opens up (dilates).  Once you are in labor and admitted into the hospital or care center, your caregiver will do the following:  A complete physical examination.   Check your vital signs (blood pressure, pulse, temperature and the fetal heart rate).   Do a vaginal examination (using a sterile glove and lubricant) to determine:   The position (presentation) of the baby (head [vertex] or buttock first).   The level (station) of the baby's head in the birth canal.   The effacement and dilatation of the cervix.   You may have your pubic hair shaved and be given an enema depending on your caregiver and the circumstance.   An electronic monitor is usually placed on your abdomen. The monitor follows the length and intensity of the contractions, as well as the baby's heart rate.   Usually, your caregiver will insert an IV in your arm with a bottle of sugar water. This is done as a precaution so that medications can be given to you quickly during labor or delivery.  NORMAL LABOR AND DELIVERY IS DIVIDED UP INTO 3 STAGES: First Stage This is when regular contractions begin and the cervix begins to  efface and dilate. This stage can last from 3 to 15 hours. The end of the first stage is when the cervix is 100% effaced and 10 centimeters dilated. Pain medications may be given by   Injection (morphine, demerol, etc.)   Regional anesthesia (spinal, caudal or epidural, anesthetics given in different locations of the spine). Paracervical pain medication may be given, which is an injection of and anesthetic on each side of the cervix.  A pregnant woman may request to have "Natural Childbirth" which is not to have any medications or anesthesia during her labor and delivery. Second Stage This is when the baby comes down through the birth canal (vagina) and is born. This can take 1 to 4 hours. As the baby's head comes down through the birth canal, you may feel like you are going to have a bowel movement. You will get the urge to bear down and push until the baby is delivered. As the baby's head is being delivered, the caregiver will decide if an episiotomy (a cut in the perineum and vagina area) is needed to prevent tearing of the tissue in this area. The episiotomy is sewn up after the delivery of the baby and placenta. Sometimes a mask with nitrous oxide is given for the mother to breath during the delivery of the baby to help if there is too much pain. The end of Stage 2 is when the baby is fully delivered. Then  when the umbilical cord stops pulsating it is clamped and cut. Third Stage The third stage begins after the baby is completely delivered and ends after the placenta (afterbirth) is delivered. This usually takes 5 to 30 minutes. After the placenta is delivered, a medication is given either by intravenous or injection to help contract the uterus and prevent bleeding. The third stage is not painful and pain medication is usually not necessary. If an episiotomy was done, it is repaired at this time. After the delivery, the mother is watched and monitored closely for 1 to 2 hours to make sure there is no  postpartum bleeding (hemorrhage). If there is a lot of bleeding, medication is given to contract the uterus and stop the bleeding. Document Released: 10/09/2007 Document Revised: 09/11/2010 Document Reviewed: 10/09/2007 [facility] Patient Information 2012 [place], [place].

## 2011-03-14 NOTE — Progress Notes (Signed)
Currently @ 36.4 weeks today. Still complains of discharge, but no bleeding or itching. Plans to bottle feed and desires circumcision for baby boy. Also interested in Depo after delivery. Will obtain wet prep, cervical cultures, and GBS today. Red flags, kick counts, and labor precautions reviewed. RTC in one week.

## 2011-03-17 LAB — CULTURE, BETA STREP (GROUP B ONLY)

## 2011-03-18 ENCOUNTER — Encounter (HOSPITAL_COMMUNITY): Payer: Self-pay | Admitting: *Deleted

## 2011-03-18 ENCOUNTER — Inpatient Hospital Stay (HOSPITAL_COMMUNITY)
Admission: AD | Admit: 2011-03-18 | Discharge: 2011-03-18 | Disposition: A | Discharge status: Home / Self Care | Payer: Medicaid Other | Source: Ambulatory Visit | Attending: Obstetrics & Gynecology | Admitting: Obstetrics & Gynecology

## 2011-03-18 DIAGNOSIS — M549 Dorsalgia, unspecified: Secondary | ICD-10-CM | POA: Insufficient documentation

## 2011-03-18 DIAGNOSIS — O4700 False labor before 37 completed weeks of gestation, unspecified trimester: Secondary | ICD-10-CM | POA: Insufficient documentation

## 2011-03-18 DIAGNOSIS — O479 False labor, unspecified: Secondary | ICD-10-CM

## 2011-03-18 LAB — URINALYSIS, ROUTINE W REFLEX MICROSCOPIC
Bilirubin Urine: NEGATIVE
Glucose, UA: NEGATIVE mg/dL
Hgb urine dipstick: NEGATIVE
Ketones, ur: 15 mg/dL — AB
Leukocytes, UA: NEGATIVE
Nitrite: NEGATIVE
Protein, ur: NEGATIVE mg/dL
Specific Gravity, Urine: 1.02 (ref 1.005–1.030)
Urobilinogen, UA: 0.2 mg/dL (ref 0.0–1.0)
pH: 7 (ref 5.0–8.0)

## 2011-03-18 LAB — CBC
HCT: 32.4 % — ABNORMAL LOW (ref 36.0–46.0)
Hemoglobin: 10.6 g/dL — ABNORMAL LOW (ref 12.0–15.0)
MCH: 27.7 pg (ref 26.0–34.0)
MCHC: 32.7 g/dL (ref 30.0–36.0)
MCV: 84.8 fL (ref 78.0–100.0)
Platelets: 182 10*3/uL (ref 150–400)
RBC: 3.82 MIL/uL — ABNORMAL LOW (ref 3.87–5.11)
RDW: 14.8 % (ref 11.5–15.5)
WBC: 7.2 10*3/uL (ref 4.0–10.5)

## 2011-03-18 MED ORDER — LACTATED RINGERS IV BOLUS (SEPSIS)
1000.0000 mL | Freq: Once | INTRAVENOUS | Status: AC
  Administered 2011-03-18: 1000 mL via INTRAVENOUS

## 2011-03-18 MED ORDER — ONDANSETRON HCL 4 MG/2ML IJ SOLN
4.0000 mg | Freq: Once | INTRAMUSCULAR | Status: AC
  Administered 2011-03-18: 4 mg via INTRAVENOUS
  Filled 2011-03-18: qty 2

## 2011-03-18 NOTE — Discharge Instructions (Signed)

## 2011-03-18 NOTE — Progress Notes (Signed)
Pain in lower back, more on R side, woke patient from sleep during the night. Does not know what time. States she vomited once on way to hospital. No CVA tenderness. Never experienced before. Patient is restless, uncomfortable.

## 2011-03-21 ENCOUNTER — Ambulatory Visit (INDEPENDENT_AMBULATORY_CARE_PROVIDER_SITE_OTHER): Payer: Medicaid Other | Admitting: Family Medicine

## 2011-03-21 DIAGNOSIS — Z331 Pregnant state, incidental: Secondary | ICD-10-CM

## 2011-03-21 NOTE — Progress Notes (Signed)
26 year old G3 P1011 at 37.4 weeks at a gestational age. No real complaints today. Denies any bleeding, vaginal discharge. Feeling baby move daily. Kick counts range 8-10 per hour GBS negative.  Cervical cultures negative.  Discussed this with patient.   Reviewed red flags.  FU 1 week

## 2011-03-28 ENCOUNTER — Ambulatory Visit (INDEPENDENT_AMBULATORY_CARE_PROVIDER_SITE_OTHER): Payer: Medicaid Other | Admitting: Family Medicine

## 2011-03-28 VITALS — BP 118/78 | Temp 98.0°F | Wt 166.3 lb

## 2011-03-28 DIAGNOSIS — Z3480 Encounter for supervision of other normal pregnancy, unspecified trimester: Secondary | ICD-10-CM

## 2011-03-28 DIAGNOSIS — Z3490 Encounter for supervision of normal pregnancy, unspecified, unspecified trimester: Secondary | ICD-10-CM

## 2011-03-28 NOTE — Patient Instructions (Signed)
Return to clinic in one week. Induction will be scheduled after [redacted] weeks gestational age. Keep up the good work.  Hopefully, I will see you at the hospital soon!  Normal Labor and Delivery Your caregiver must first be sure you are in labor. Signs of labor include:  You may pass what is called "the mucus plug" before labor begins. This is a small amount of blood stained mucus.   Regular uterine contractions.   The time between contractions get closer together.   The discomfort and pain gradually gets more intense.   Pains are mostly located in the back.   Pains get worse when walking.   The cervix (the opening of the uterus becomes thinner (begins to efface) and opens up (dilates).  Once you are in labor and admitted into the hospital or care center, your caregiver will do the following:  A complete physical examination.   Check your vital signs (blood pressure, pulse, temperature and the fetal heart rate).   Do a vaginal examination (using a sterile glove and lubricant) to determine:   The position (presentation) of the baby (head [vertex] or buttock first).   The level (station) of the baby's head in the birth canal.   The effacement and dilatation of the cervix.   You may have your pubic hair shaved and be given an enema depending on your caregiver and the circumstance.   An electronic monitor is usually placed on your abdomen. The monitor follows the length and intensity of the contractions, as well as the baby's heart rate.   Usually, your caregiver will insert an IV in your arm with a bottle of sugar water. This is done as a precaution so that medications can be given to you quickly during labor or delivery.  NORMAL LABOR AND DELIVERY IS DIVIDED UP INTO 3 STAGES: First Stage This is when regular contractions begin and the cervix begins to efface and dilate. This stage can last from 3 to 15 hours. The end of the first stage is when the cervix is 100% effaced and 10  centimeters dilated. Pain medications may be given by   Injection (morphine, demerol, etc.)   Regional anesthesia (spinal, caudal or epidural, anesthetics given in different locations of the spine). Paracervical pain medication may be given, which is an injection of and anesthetic on each side of the cervix.  A pregnant woman may request to have "Natural Childbirth" which is not to have any medications or anesthesia during her labor and delivery. Second Stage This is when the baby comes down through the birth canal (vagina) and is born. This can take 1 to 4 hours. As the baby's head comes down through the birth canal, you may feel like you are going to have a bowel movement. You will get the urge to bear down and push until the baby is delivered. As the baby's head is being delivered, the caregiver will decide if an episiotomy (a cut in the perineum and vagina area) is needed to prevent tearing of the tissue in this area. The episiotomy is sewn up after the delivery of the baby and placenta. Sometimes a mask with nitrous oxide is given for the mother to breath during the delivery of the baby to help if there is too much pain. The end of Stage 2 is when the baby is fully delivered. Then when the umbilical cord stops pulsating it is clamped and cut. Third Stage The third stage begins after the baby is completely delivered and  ends after the placenta (afterbirth) is delivered. This usually takes 5 to 30 minutes. After the placenta is delivered, a medication is given either by intravenous or injection to help contract the uterus and prevent bleeding. The third stage is not painful and pain medication is usually not necessary. If an episiotomy was done, it is repaired at this time. After the delivery, the mother is watched and monitored closely for 1 to 2 hours to make sure there is no postpartum bleeding (hemorrhage). If there is a lot of bleeding, medication is given to contract the uterus and stop the  bleeding. Document Released: 10/09/2007 Document Revised: 12/19/2010 Document Reviewed: 10/09/2007 [facility] Patient Information 2012 [place], [place].

## 2011-03-28 NOTE — Progress Notes (Signed)
[id number] @ 38.5 weeks.  Ready to give birth.  Complains of suprapubic pain and difficulty sleeping. Denies any vaginal bleeding, discharge, decreased kick counts, or gush of fluid. GBS negative.  Cervical cx negative.  Exam: Cervix: 3.5/60//-2  Plans to use Depo.  Baby boy - desires circ.  Plans to bottle feed. Reviewed red flags.  Follow up in 1 week.

## 2011-04-01 ENCOUNTER — Encounter (HOSPITAL_COMMUNITY): Payer: Self-pay | Admitting: *Deleted

## 2011-04-01 ENCOUNTER — Inpatient Hospital Stay (HOSPITAL_COMMUNITY)
Admission: AD | Admit: 2011-04-01 | Discharge: 2011-04-01 | Disposition: A | Discharge status: Home / Self Care | Payer: Medicaid Other | Source: Ambulatory Visit | Attending: Obstetrics and Gynecology | Admitting: Obstetrics and Gynecology

## 2011-04-01 DIAGNOSIS — O479 False labor, unspecified: Secondary | ICD-10-CM

## 2011-04-01 DIAGNOSIS — N898 Other specified noninflammatory disorders of vagina: Secondary | ICD-10-CM

## 2011-04-01 DIAGNOSIS — R109 Unspecified abdominal pain: Secondary | ICD-10-CM | POA: Insufficient documentation

## 2011-04-01 DIAGNOSIS — L293 Anogenital pruritus, unspecified: Secondary | ICD-10-CM

## 2011-04-01 NOTE — MAU Note (Signed)
Patient states she has been having pelvic pressure. Denies any bleeding or leaking fluid. States the baby is moving but less than usual. Good fetal heart tones in triage.

## 2011-04-01 NOTE — MAU Provider Note (Signed)
History     CSN: [id number]  Arrival date and time: 04/01/11 1359   None   Rachel Lloyd is a 26 yo G3P1011 at 39w 1d presenting with pelvic and buttock cramping and abdominal tightening.  Chief Complaint  Patient presents with  . Labor Eval   HPI Rachel Lloyd is a 26 yo G3P1011 at 39w 1d presenting with pelvic and buttock pain and abdominal tightening.  Patient describes the pelvic and buttock pain as mild cramping rated 6/10 starting at 4:30-5:00 this am.  She stated the abdominal tightening started about 13:00 and feels similar to labor contractions she experienced in previous pregnancies.  She also had one episode of vomiting this am.  She has been feeling the baby move.   She denies nausea, fever, LOF, vaginal bleeding or discharge.  She has prenatal care at [facility].    OB History    Grav Para Term Preterm Abortions TAB SAB Ect Mult Living   3 1 1  0 1 1 0 0 0 1      Past Medical History  Diagnosis Date  . Mental disorder 2010    bipolar  . Scabies exposure 2012    treated july 2012 & reexposed    Past Surgical History  Procedure Date  . Mouth surgery     as a child    Family History  Problem Relation Age of Onset  . Hypertension Father   . Anesthesia problems Neg Hx     History  Substance Use Topics  . Smoking status: Former Smoker    Types: Cigarettes  . Smokeless tobacco: Never Used  . Alcohol Use: No     quit when found out was pregnant    Allergies:  Allergies  Allergen Reactions  . Penicillins Anaphylaxis  . Latex Itching and Rash    No prescriptions prior to admission    Review of Systems  Constitutional: Negative for fever.  Gastrointestinal: Positive for vomiting (one episode this morning) and abdominal pain (pelvic cramping / buttock pain and abdominal tightening.). Negative for nausea and diarrhea.   Physical Exam   Blood pressure 120/71, pulse 97, temperature 97.8 F (36.6 C), temperature source Oral, resp. rate 16,  height 5' 0.5" (1.537 m), weight 76.658 kg (169 lb), SpO2 99.00%.  Physical Exam  Constitutional: She is oriented to person, place, and time. She appears well-developed and well-nourished. No distress.  HENT:  Head: Normocephalic and atraumatic.  Neck: Neck supple.  Respiratory: Effort normal.  GI: She exhibits distension (gravida). There is tenderness.  Neurological: She is alert and oriented to person, place, and time.  Pelvic: External genitalia with normal hair distributions, no lesions noted. Vagina pink with normal discharge, no lesions noted. Cervix: 3.5/60/-3 (examined by Dr. Lula Olszewski)  Fetus  MAU Course  Procedures   Assessment and Plan  26 yo G3P1011 at 39w 1d presenting with contractions  Plan Ambulate for 1 hour then reassess the cervix for progression. Transfer care to Ardyth Gal, M.D. Mardene Speak 04/01/2011, 3:43 PM   PGY2 Addendum:   I have seen and examined this patient and agree with student note.    Patient ambulated for one hour, re-check cervical exam was unchanged at 3.5/60/-3.    FHT: baseline 140, + accelerations, no decelerations, good variability, Category I tracing Toco: No contractions.   A/P:  26 y.o.G3P1011 at [redacted]w[redacted]d with braxton-hicks contractions:  - discharged home, patient given instructions to return when contractions intensify.  - follow up with Dr.de la  cruz at [facility].

## 2011-04-01 NOTE — Discharge Instructions (Signed)
Braxton Hicks Contractions Pregnancy is commonly associated with contractions of the uterus throughout the pregnancy. Towards the end of pregnancy (32 to 34 weeks), these contractions [facility] Rough) can develop more often and may become more forceful. This is not true labor because these contractions do not result in opening (dilatation) and thinning of the cervix. They are sometimes difficult to tell apart from true labor because these contractions can be forceful and people have different pain tolerances. You should not feel embarrassed if you go to the hospital with false labor. Sometimes, the only way to tell if you are in true labor is for your caregiver to follow the changes in the cervix. How to tell the difference between true and false labor:  False labor.   The contractions of false labor are usually shorter, irregular and not as hard as those of true labor.   They are often felt in the front of the lower abdomen and in the groin.   They may leave with walking around or changing positions while lying down.   They get weaker and are shorter lasting as time goes on.   These contractions are usually irregular.   They do not usually become progressively stronger, regular and closer together as with true labor.   True labor.   Contractions in true labor last 30 to 70 seconds, become very regular, usually become more intense, and increase in frequency.   They do not go away with walking.   The discomfort is usually felt in the top of the uterus and spreads to the lower abdomen and low back.   True labor can be determined by your caregiver with an exam. This will show that the cervix is dilating and getting thinner.  If there are no prenatal problems or other health problems associated with the pregnancy, it is completely safe to be sent home with false labor and await the onset of true labor. HOME CARE INSTRUCTIONS   Keep up with your usual exercises and instructions.   Take  medications as directed.   Keep your regular prenatal appointment.   Eat and drink lightly if you think you are going into labor.   If BH contractions are making you uncomfortable:   Change your activity position from lying down or resting to walking/walking to resting.   Sit and rest in a tub of warm water.   Drink 2 to 3 glasses of water. Dehydration may cause B-H contractions.   Do slow and deep breathing several times an hour.  SEEK IMMEDIATE MEDICAL CARE IF:   Your contractions continue to become stronger, more regular, and closer together.   You have a gushing, burst or leaking of fluid from the vagina.   An oral temperature above 102 F (38.9 C) develops.   You have passage of blood-tinged mucus.   You develop vaginal bleeding.   You develop continuous belly (abdominal) pain.   You have low back pain that you never had before.   You feel the baby's head pushing down causing pelvic pressure.   The baby is not moving as much as it used to.  Document Released: 12/30/2004 Document Revised: 12/19/2010 Document Reviewed: 06/23/2008 [facility] Patient Information 2012 [place], [place].Braxton Hicks Contractions Pregnancy is commonly associated with contractions of the uterus throughout the pregnancy. Towards the end of pregnancy (32 to 34 weeks), these contractions [facility]) can develop more often and may become more forceful. This is not true labor because these contractions do not result in opening (dilatation)  and thinning of the cervix. They are sometimes difficult to tell apart from true labor because these contractions can be forceful and people have different pain tolerances. You should not feel embarrassed if you go to the hospital with false labor. Sometimes, the only way to tell if you are in true labor is for your caregiver to follow the changes in the cervix. How to tell the difference between true and false labor:  False labor.   The contractions of false  labor are usually shorter, irregular and not as hard as those of true labor.   They are often felt in the front of the lower abdomen and in the groin.   They may leave with walking around or changing positions while lying down.   They get weaker and are shorter lasting as time goes on.   These contractions are usually irregular.   They do not usually become progressively stronger, regular and closer together as with true labor.   True labor.   Contractions in true labor last 30 to 70 seconds, become very regular, usually become more intense, and increase in frequency.   They do not go away with walking.   The discomfort is usually felt in the top of the uterus and spreads to the lower abdomen and low back.   True labor can be determined by your caregiver with an exam. This will show that the cervix is dilating and getting thinner.  If there are no prenatal problems or other health problems associated with the pregnancy, it is completely safe to be sent home with false labor and await the onset of true labor. HOME CARE INSTRUCTIONS   Keep up with your usual exercises and instructions.   Take medications as directed.   Keep your regular prenatal appointment.   Eat and drink lightly if you think you are going into labor.   If BH contractions are making you uncomfortable:   Change your activity position from lying down or resting to walking/walking to resting.   Sit and rest in a tub of warm water.   Drink 2 to 3 glasses of water. Dehydration may cause B-H contractions.   Do slow and deep breathing several times an hour.  SEEK IMMEDIATE MEDICAL CARE IF:   Your contractions continue to become stronger, more regular, and closer together.   You have a gushing, burst or leaking of fluid from the vagina.   An oral temperature above 102 F (38.9 C) develops.   You have passage of blood-tinged mucus.   You develop vaginal bleeding.   You develop continuous belly (abdominal)  pain.   You have low back pain that you never had before.   You feel the baby's head pushing down causing pelvic pressure.   The baby is not moving as much as it used to.  Document Released: 12/30/2004 Document Revised: 12/19/2010 Document Reviewed: 06/23/2008 [facility] Patient Information 2012 [place], [place].Abdominal Pain During Pregnancy Abdominal discomfort is common in pregnancy. Most of the time, it does not cause harm. There are many causes of abdominal pain. Some causes are more serious than others. Some of the causes of abdominal pain in pregnancy are easily diagnosed. Occasionally, the diagnosis takes time to understand. Other times, the cause is not determined. Abdominal pain can be a sign that something is very wrong with the pregnancy, or the pain may have nothing to do with the pregnancy at all. For this reason, always tell your caregiver if you have any abdominal discomfort. CAUSES Common  and harmless causes of abdominal pain include:  Constipation.   Excess gas and bloating.   Round ligament pain. This is pain that is felt in the folds of the groin.   The position the baby or placenta is in.   Baby kicks.   Braxton-Hicks contractions. These are mild contractions that do not cause cervical dilation.  Serious causes of abdominal pain include:  Ectopic pregnancy. This happens when a fertilized egg implants outside of the uterus.   Miscarriage.   Preterm labor. This is when labor starts at less than 37 weeks of pregnancy.   Placental abruption. This is when the placenta partially or completely separates from the uterus.   Preeclampsia. This is often associated with high blood pressure and has been referred to as "toxemia in pregnancy."   Uterine or amniotic fluid infections.  Causes unrelated to pregnancy include:  Urinary tract infection.   Gallbladder stones or inflammation.   Hepatitis or other liver illness.   Intestinal problems, stomach flu, food  poisoning, or ulcer.   Appendicitis.   Kidney (renal) stones.   Kidney infection (pylonephritis).  HOME CARE INSTRUCTIONS  For mild pain:  Do not have sexual intercourse or put anything in your vagina until your symptoms go away completely.   Get plenty of rest until your pain improves. If your pain does not improve in 1 hour, call your caregiver.   Drink clear fluids if you feel nauseous. Avoid solid food as long as you are uncomfortable or nauseous.   Only take medicine as directed by your caregiver.   Keep all follow-up appointments with your caregiver.  SEEK IMMEDIATE MEDICAL CARE IF:  You are bleeding, leaking fluid, or passing tissue from the vagina.   You have increasing pain or cramping.   You have persistent vomiting.   You have painful or bloody urination.   You have a fever.   You notice a decrease in your baby's movements.   You have extreme weakness or feel faint.   You have shortness of breath, with or without abdominal pain.   You develop a severe headache with abdominal pain.   You have abnormal vaginal discharge with abdominal pain.   You have persistent diarrhea.   You have abdominal pain that continues even after rest, or gets worse.  MAKE SURE YOU:   Understand these instructions.   Will watch your condition.   Will get help right away if you are not doing well or get worse.  Document Released: 12/30/2004 Document Revised: 12/19/2010 Document Reviewed: 07/26/2010 [facility] Patient Information 2012 [place], [place].

## 2011-04-04 ENCOUNTER — Telehealth (HOSPITAL_COMMUNITY): Payer: Self-pay | Admitting: *Deleted

## 2011-04-04 ENCOUNTER — Encounter (HOSPITAL_COMMUNITY): Payer: Self-pay | Admitting: *Deleted

## 2011-04-04 ENCOUNTER — Ambulatory Visit (INDEPENDENT_AMBULATORY_CARE_PROVIDER_SITE_OTHER): Payer: Medicaid Other | Admitting: Family Medicine

## 2011-04-04 VITALS — BP 133/77 | Temp 98.3°F | Wt 169.1 lb

## 2011-04-04 DIAGNOSIS — Z3490 Encounter for supervision of normal pregnancy, unspecified, unspecified trimester: Secondary | ICD-10-CM

## 2011-04-04 DIAGNOSIS — Z3480 Encounter for supervision of other normal pregnancy, unspecified trimester: Secondary | ICD-10-CM

## 2011-04-04 NOTE — Telephone Encounter (Signed)
Preadmission screen  

## 2011-04-04 NOTE — Progress Notes (Signed)
[id number] @ 39.4 weeks.  Complains of clear discharge, irregular contractions (5-30 min apart), and pelvic pain. Denies any vaginal bleeding or leaking fluid. Good fetal movement. GBS negative. Cervical cx negative.   Plans to use Depo. Baby boy - desires circ. Plans to bottle feed.  Reviewed red flags. Follow up in 1 week. IOL scheduled for 04/21/11 @ 630 AM.

## 2011-04-04 NOTE — Patient Instructions (Signed)
Return to clinic in one week with Dr. Tye Savoy.  Normal Labor and Delivery Your caregiver must first be sure you are in labor. Signs of labor include:  You may pass what is called "the mucus plug" before labor begins. This is a small amount of blood stained mucus.   Regular uterine contractions.   The time between contractions get closer together.   The discomfort and pain gradually gets more intense.   Pains are mostly located in the back.   Pains get worse when walking.   The cervix (the opening of the uterus becomes thinner (begins to efface) and opens up (dilates).  Once you are in labor and admitted into the hospital or care center, your caregiver will do the following:  A complete physical examination.   Check your vital signs (blood pressure, pulse, temperature and the fetal heart rate).   Do a vaginal examination (using a sterile glove and lubricant) to determine:   The position (presentation) of the baby (head [vertex] or buttock first).   The level (station) of the baby's head in the birth canal.   The effacement and dilatation of the cervix.   You may have your pubic hair shaved and be given an enema depending on your caregiver and the circumstance.   An electronic monitor is usually placed on your abdomen. The monitor follows the length and intensity of the contractions, as well as the baby's heart rate.   Usually, your caregiver will insert an IV in your arm with a bottle of sugar water. This is done as a precaution so that medications can be given to you quickly during labor or delivery.  NORMAL LABOR AND DELIVERY IS DIVIDED UP INTO 3 STAGES: First Stage This is when regular contractions begin and the cervix begins to efface and dilate. This stage can last from 3 to 15 hours. The end of the first stage is when the cervix is 100% effaced and 10 centimeters dilated. Pain medications may be given by   Injection (morphine, demerol, etc.)   Regional anesthesia  (spinal, caudal or epidural, anesthetics given in different locations of the spine). Paracervical pain medication may be given, which is an injection of and anesthetic on each side of the cervix.  A pregnant woman may request to have "Natural Childbirth" which is not to have any medications or anesthesia during her labor and delivery. Second Stage This is when the baby comes down through the birth canal (vagina) and is born. This can take 1 to 4 hours. As the baby's head comes down through the birth canal, you may feel like you are going to have a bowel movement. You will get the urge to bear down and push until the baby is delivered. As the baby's head is being delivered, the caregiver will decide if an episiotomy (a cut in the perineum and vagina area) is needed to prevent tearing of the tissue in this area. The episiotomy is sewn up after the delivery of the baby and placenta. Sometimes a mask with nitrous oxide is given for the mother to breath during the delivery of the baby to help if there is too much pain. The end of Stage 2 is when the baby is fully delivered. Then when the umbilical cord stops pulsating it is clamped and cut. Third Stage The third stage begins after the baby is completely delivered and ends after the placenta (afterbirth) is delivered. This usually takes 5 to 30 minutes. After the placenta is delivered,  a medication is given either by intravenous or injection to help contract the uterus and prevent bleeding. The third stage is not painful and pain medication is usually not necessary. If an episiotomy was done, it is repaired at this time. After the delivery, the mother is watched and monitored closely for 1 to 2 hours to make sure there is no postpartum bleeding (hemorrhage). If there is a lot of bleeding, medication is given to contract the uterus and stop the bleeding. Document Released: 10/09/2007 Document Revised: 12/19/2010 Document Reviewed: 10/09/2007 [facility] Patient  Information 2012 [place], [place].

## 2011-04-05 NOTE — MAU Provider Note (Signed)
Agree with above note.  Rachel Lloyd 04/05/2011 7:45 AM

## 2011-04-07 ENCOUNTER — Telehealth: Payer: Self-pay | Admitting: *Deleted

## 2011-04-07 NOTE — Telephone Encounter (Signed)
Patient calling requesting appt to "have be checked for contractions."  Patient states she is having some lower abdominal pressure "like menstrual cramping."  States she is unable to time contractions and denies water breaking.  Wants appt to have cervix checked to see if she has dilated enough before going to [facility].  Spoke with Dr. McDiarmid (preceptor) and informed patient if she has any concerns about contractions and being in labor, she needs to go to [facility] to be evaluated.  Patient verbalized understanding.   Gaylene Brooks, RN

## 2011-04-09 ENCOUNTER — Telehealth: Payer: Self-pay | Admitting: *Deleted

## 2011-04-09 ENCOUNTER — Ambulatory Visit (INDEPENDENT_AMBULATORY_CARE_PROVIDER_SITE_OTHER): Payer: Medicaid Other | Admitting: Family Medicine

## 2011-04-09 ENCOUNTER — Telehealth: Payer: Self-pay | Admitting: Family Medicine

## 2011-04-09 VITALS — BP 116/66 | Temp 98.4°F

## 2011-04-09 DIAGNOSIS — O26899 Other specified pregnancy related conditions, unspecified trimester: Secondary | ICD-10-CM

## 2011-04-09 DIAGNOSIS — Z3490 Encounter for supervision of normal pregnancy, unspecified, unspecified trimester: Secondary | ICD-10-CM

## 2011-04-09 DIAGNOSIS — N898 Other specified noninflammatory disorders of vagina: Secondary | ICD-10-CM

## 2011-04-09 DIAGNOSIS — L293 Anogenital pruritus, unspecified: Secondary | ICD-10-CM

## 2011-04-09 DIAGNOSIS — Z3480 Encounter for supervision of other normal pregnancy, unspecified trimester: Secondary | ICD-10-CM

## 2011-04-09 LAB — POCT WET PREP (WET MOUNT): KOH Wet Prep POC: NEGATIVE

## 2011-04-09 LAB — POCT FERN TEST: Fern Test: NEGATIVE

## 2011-04-09 NOTE — Progress Notes (Signed)
G3 P1 011 at 40.[redacted] weeks gestation.   Induction scheduled for April 3 at 6:30 AM. Patient reports good fetal movement. Denies vaginal bleeding. Patient is concerned, she feels minimal leaking but no gush of fluid.  Exam: No vaginal rash present on exam. No pooling. Vaginal Thick, mucous discharge present.  Plan: Wet prep, amniosure, fern test performed today. Followup on Monday with Dr. Domenick Bookbinder.

## 2011-04-09 NOTE — Assessment & Plan Note (Signed)
Wet prep negative for yeast or BV.

## 2011-04-09 NOTE — Patient Instructions (Signed)
Please go to [facility] when you feel a gush of fluid, vaginal bleeding, or regular contractions every 10 minutes. Schedule follow up appointment with me on Monday, April 1st. Your induction will take place on Wednesday, April 3rd.

## 2011-04-09 NOTE — Telephone Encounter (Signed)
Called and told pt that her induction has changed to 04.03.2013 @ 630 am. Pt told that if she is having any problems to call our office or go to MAU. Pt voiced understanding and agreed.Loralee Pacas [place]

## 2011-04-09 NOTE — Telephone Encounter (Signed)
I'm sorry for the confusion, but Rachel Lloyd should be scheduled to see me instead of Dr. Cristal Ford.  Can you please call and change her appointment to see me on Tuesday, April 2nd?  Thank you.

## 2011-04-10 ENCOUNTER — Encounter (HOSPITAL_COMMUNITY): Payer: Self-pay | Admitting: *Deleted

## 2011-04-10 ENCOUNTER — Inpatient Hospital Stay (HOSPITAL_COMMUNITY)
Admission: AD | Admit: 2011-04-10 | Discharge: 2011-04-10 | Disposition: A | Discharge status: Home / Self Care | Payer: Medicaid Other | Source: Ambulatory Visit | Attending: Family Medicine | Admitting: Family Medicine

## 2011-04-10 DIAGNOSIS — O471 False labor at or after 37 completed weeks of gestation: Secondary | ICD-10-CM

## 2011-04-10 DIAGNOSIS — Z3490 Encounter for supervision of normal pregnancy, unspecified, unspecified trimester: Secondary | ICD-10-CM

## 2011-04-10 DIAGNOSIS — M549 Dorsalgia, unspecified: Secondary | ICD-10-CM | POA: Insufficient documentation

## 2011-04-10 DIAGNOSIS — L293 Anogenital pruritus, unspecified: Secondary | ICD-10-CM

## 2011-04-10 DIAGNOSIS — O479 False labor, unspecified: Secondary | ICD-10-CM | POA: Insufficient documentation

## 2011-04-10 DIAGNOSIS — N898 Other specified noninflammatory disorders of vagina: Secondary | ICD-10-CM

## 2011-04-10 DIAGNOSIS — O99891 Other specified diseases and conditions complicating pregnancy: Secondary | ICD-10-CM | POA: Insufficient documentation

## 2011-04-10 NOTE — MAU Provider Note (Signed)
Chart reviewed and agree with management and plan.  

## 2011-04-10 NOTE — Discharge Instructions (Signed)

## 2011-04-10 NOTE — MAU Provider Note (Signed)
Rachel Lloyd y.o.G3P1011 @[redacted]w[redacted]d  by LMP Chief Complaint  Patient presents with  . Back Pain    SUBJECTIVE  HPI: Presents for labor check. Mostly feels UCs in low back. Mucusy, no watery, discharge. No VB or show. States cx 4 cm in office yesterday. Good FM. Pregnancy uncomplicated at [facility]. IOL scheduled 04/16/11.  Past Medical History  Diagnosis Date  . Mental disorder 2010    bipolar  . Scabies exposure 2012    treated july 2012 & reexposed   Past Surgical History  Procedure Date  . Mouth surgery     as a child   History   Social History  . Marital Status: Single    Spouse Name: N/A    Number of Children: N/A  . Years of Education: N/A   Occupational History  . Not on file.   Social History Main Topics  . Smoking status: Former Smoker    Types: Cigarettes    Quit date: 04/04/2010  . Smokeless tobacco: Never Used  . Alcohol Use: No     quit when found out was pregnant  . Drug Use: No  . Sexually Active: Yes    Birth Control/ Protection: None   Other Topics Concern  . Not on file   Social History Narrative  . No narrative on file   No current facility-administered medications on file prior to encounter.   No current outpatient prescriptions on file prior to encounter.   Allergies  Allergen Reactions  . Penicillins Anaphylaxis  . Latex Itching and Rash    ROS: Pertinent items in HPI  OBJECTIVE Blood pressure 109/64, pulse 102, temperature 98.9 F (37.2 C), temperature source Oral, resp. rate 18. GENERAL: Well-developed, well-nourished female in no acute distress.  ABDOMEN: Soft, nontender EXTREMITIES: Nontender, no edema CX: posterior long, int 2-3, -3 cephalic  UCs q 8 min, mild FHR: 140, reactive     ASSESSMENT G3P1011 at [redacted]w[redacted]d not in established labor Cat 1 FHR  PLAN Offerred options of discharge home now with labor precautioins and kick counts or walk and have cx rechecked in 1.5 hr. Opts to walk and recheck.

## 2011-04-10 NOTE — Progress Notes (Signed)
Re checked cervix after > 1 hour of walking, no change.  Reviewed labor precautions.  Discharge home follow up with Dr.delacruz at next scheduled appointment or return to MAU when contractions become stronger and closer together.   Rachel Lloyd 04/10/2011 6:03 PM

## 2011-04-10 NOTE — MAU Note (Signed)
Pt reports having back pain on and off since last night report shaving vaginal discharge and wetness. Repots good fetal movent.

## 2011-04-11 ENCOUNTER — Encounter (HOSPITAL_COMMUNITY): Payer: Self-pay | Admitting: *Deleted

## 2011-04-11 ENCOUNTER — Encounter (HOSPITAL_COMMUNITY): Payer: Self-pay | Admitting: Anesthesiology

## 2011-04-11 ENCOUNTER — Inpatient Hospital Stay (HOSPITAL_COMMUNITY): Payer: Medicaid Other | Admitting: Anesthesiology

## 2011-04-11 ENCOUNTER — Inpatient Hospital Stay (HOSPITAL_COMMUNITY)
Admission: AD | Admit: 2011-04-11 | Discharge: 2011-04-14 | DRG: 774 | Disposition: A | Discharge status: Home / Self Care | Payer: Medicaid Other | Source: Ambulatory Visit | Attending: Obstetrics & Gynecology | Admitting: Obstetrics & Gynecology

## 2011-04-11 DIAGNOSIS — N898 Other specified noninflammatory disorders of vagina: Secondary | ICD-10-CM

## 2011-04-11 DIAGNOSIS — O479 False labor, unspecified: Secondary | ICD-10-CM

## 2011-04-11 LAB — CBC
HCT: 33.8 % — ABNORMAL LOW (ref 36.0–46.0)
Hemoglobin: 10.7 g/dL — ABNORMAL LOW (ref 12.0–15.0)
MCH: 27 pg (ref 26.0–34.0)
MCHC: 31.7 g/dL (ref 30.0–36.0)
MCV: 85.1 fL (ref 78.0–100.0)
Platelets: 217 10*3/uL (ref 150–400)
RBC: 3.97 MIL/uL (ref 3.87–5.11)
RDW: 15.5 % (ref 11.5–15.5)
WBC: 7.4 10*3/uL (ref 4.0–10.5)

## 2011-04-11 MED ORDER — ONDANSETRON HCL 4 MG/2ML IJ SOLN: 4.0000 mg | Freq: Four times a day (QID) | INTRAMUSCULAR | Status: DC | PRN

## 2011-04-11 MED ORDER — FENTANYL 2.5 MCG/ML BUPIVACAINE 1/10 % EPIDURAL INFUSION (WH - ANES)
14.0000 mL/h | INTRAMUSCULAR | Status: DC
  Administered 2011-04-11 (×2): 14 mL/h via EPIDURAL
  Filled 2011-04-11 (×3): qty 60

## 2011-04-11 MED ORDER — LACTATED RINGERS IV SOLN
INTRAVENOUS | Status: DC
  Administered 2011-04-11: 18:00:00 via INTRAVENOUS

## 2011-04-11 MED ORDER — OXYTOCIN 20 UNITS IN LACTATED RINGERS INFUSION - SIMPLE
1.0000 m[IU]/min | INTRAVENOUS | Status: DC
  Administered 2011-04-11: 2 m[IU]/min via INTRAVENOUS

## 2011-04-11 MED ORDER — OXYCODONE-ACETAMINOPHEN 5-325 MG PO TABS
2.0000 | ORAL_TABLET | Freq: Once | ORAL | Status: DC
  Filled 2011-04-11: qty 2

## 2011-04-11 MED ORDER — LACTATED RINGERS IV SOLN
500.0000 mL | INTRAVENOUS | Status: DC | PRN
  Administered 2011-04-11: 300 mL via INTRAVENOUS

## 2011-04-11 MED ORDER — EPHEDRINE 5 MG/ML INJ
10.0000 mg | INTRAVENOUS | Status: DC | PRN
  Filled 2011-04-11: qty 2
  Filled 2011-04-11: qty 4

## 2011-04-11 MED ORDER — PHENYLEPHRINE 40 MCG/ML (10ML) SYRINGE FOR IV PUSH (FOR BLOOD PRESSURE SUPPORT)
80.0000 ug | PREFILLED_SYRINGE | INTRAVENOUS | Status: DC | PRN
  Filled 2011-04-11: qty 2

## 2011-04-11 MED ORDER — TERBUTALINE SULFATE 1 MG/ML IJ SOLN: 0.2500 mg | Freq: Once | INTRAMUSCULAR | Status: AC | PRN

## 2011-04-11 MED ORDER — IBUPROFEN 600 MG PO TABS
600.0000 mg | ORAL_TABLET | Freq: Four times a day (QID) | ORAL | Status: DC | PRN
  Administered 2011-04-12: 600 mg via ORAL
  Filled 2011-04-11 (×7): qty 1

## 2011-04-11 MED ORDER — FENTANYL 2.5 MCG/ML BUPIVACAINE 1/10 % EPIDURAL INFUSION (WH - ANES)
INTRAMUSCULAR | Status: DC | PRN
  Administered 2011-04-11: 13 mL/h via EPIDURAL

## 2011-04-11 MED ORDER — FLEET ENEMA 7-19 GM/118ML RE ENEM: 1.0000 | ENEMA | RECTAL | Status: DC | PRN

## 2011-04-11 MED ORDER — OXYTOCIN 20 UNITS IN LACTATED RINGERS INFUSION - SIMPLE: 125.0000 mL/h | Freq: Once | INTRAVENOUS | Status: DC

## 2011-04-11 MED ORDER — LIDOCAINE HCL (PF) 1 % IJ SOLN
30.0000 mL | INTRAMUSCULAR | Status: DC | PRN
  Administered 2011-04-12: 30 mL via SUBCUTANEOUS
  Filled 2011-04-11: qty 30

## 2011-04-11 MED ORDER — LIDOCAINE HCL (PF) 1 % IJ SOLN
INTRAMUSCULAR | Status: DC | PRN
  Administered 2011-04-11 (×2): 4 mL

## 2011-04-11 MED ORDER — OXYTOCIN BOLUS FROM INFUSION
500.0000 mL | Freq: Once | INTRAVENOUS | Status: DC
  Filled 2011-04-11: qty 500
  Filled 2011-04-11: qty 1000

## 2011-04-11 MED ORDER — DIPHENHYDRAMINE HCL 50 MG/ML IJ SOLN: 12.5000 mg | INTRAMUSCULAR | Status: DC | PRN

## 2011-04-11 MED ORDER — PHENYLEPHRINE 40 MCG/ML (10ML) SYRINGE FOR IV PUSH (FOR BLOOD PRESSURE SUPPORT)
80.0000 ug | PREFILLED_SYRINGE | INTRAVENOUS | Status: DC | PRN
  Filled 2011-04-11: qty 5
  Filled 2011-04-11: qty 2

## 2011-04-11 MED ORDER — CITRIC ACID-SODIUM CITRATE 334-500 MG/5ML PO SOLN: 30.0000 mL | ORAL | Status: DC | PRN

## 2011-04-11 MED ORDER — ACETAMINOPHEN 325 MG PO TABS: 650.0000 mg | ORAL_TABLET | ORAL | Status: DC | PRN

## 2011-04-11 MED ORDER — EPHEDRINE 5 MG/ML INJ
10.0000 mg | INTRAVENOUS | Status: DC | PRN
  Filled 2011-04-11: qty 2

## 2011-04-11 MED ORDER — OXYCODONE-ACETAMINOPHEN 5-325 MG PO TABS
1.0000 | ORAL_TABLET | ORAL | Status: DC | PRN
  Administered 2011-04-12 (×3): 1 via ORAL
  Filled 2011-04-11 (×3): qty 1

## 2011-04-11 MED ORDER — LACTATED RINGERS IV SOLN
500.0000 mL | Freq: Once | INTRAVENOUS | Status: AC
  Administered 2011-04-11: 1000 mL via INTRAVENOUS

## 2011-04-11 NOTE — Progress Notes (Signed)
**Note Rachel-Identified via Obfuscation** Rachel Lloyd is a 26 y.o. G3P1011 at [redacted]w[redacted]d by ultrasound admitted for active labor.  Subjective: Patient is comfortable.  Denies painful contractions at this time.  Complains of LUQ pain that is constant.  Objective: BP 115/84  Pulse 78  Temp(Src) 97.9 F (36.6 C) (Oral)  Resp 18  Ht 5\' 2"  (1.575 m)  Wt 77.111 kg (170 lb)  BMI 31.09 kg/m2 I/O last 3 completed shifts: In: -  Out: 700 [Urine:700]    FHT:  FHR: 148 bpm, variability: moderate,  accelerations:  Present,  decelerations:  Absent UC:   regular, every 2 minutes SVE:   Dilation: 9.5 Effacement (%): 100 Station: -1 Exam by:: Rachel [employer], DO  Labs: Lab Results  Component Value Date   WBC 7.4 04/11/2011   HGB 10.7* 04/11/2011   HCT 33.8* 04/11/2011   MCV 85.1 04/11/2011   PLT 217 04/11/2011    Assessment / Plan: Augmentation of labor, progressing well  Labor:  Pit turned off due to LUQ pain.  AROM 30 minutes ago. Preeclampsia:  no signs or symptoms of toxicity Fetal Wellbeing:  Category I Pain Control:  Epidural I/D:  n/a Anticipated MOD:  NSVD  Rachel Lloyd 04/11/2011, 7:52 PM

## 2011-04-11 NOTE — Progress Notes (Signed)
Patient not pushing effectively. Per Dr. Domenick Bookbinder, called Dr. Jean Rosenthal re: turning the epidural pump off. OK per Dr. Jean Rosenthal. Candise Che, RN

## 2011-04-11 NOTE — Progress Notes (Signed)
Pulse ox not working, replaced with new, still not working.

## 2011-04-11 NOTE — MAU Note (Signed)
DC instructions taken in to pt., pt's S/O asking why pt is being sent home if she is bleeding & having uc's, requesting to see provider.  Artelia Laroche NP informed.

## 2011-04-11 NOTE — Progress Notes (Signed)
Patient ID: Rachel Lloyd, female   DOB: 1985-04-20, 26 y.o.   MRN: [id number] Rachel Lloyd is a 26 y.o. G3P1011 at [redacted]w[redacted]d by LMP admitted for Early Labor/ Induction of labor  Subjective: Patient comfortable with epidural.   Objective: BP 94/34  Pulse 87  Temp(Src) 98.2 F (36.8 C) (Oral)  Resp 18  Ht 5\' 2"  (1.575 m)  Wt 77.111 kg (170 lb)  BMI 31.09 kg/m2      FHT:  FHR: 145 bpm, variability: moderate,  accelerations:  Present,  decelerations:  Absent UC:   regular, every 2-3 minutes SVE:   7/100%/-1 Labs: Lab Results  Component Value Date   WBC 7.4 04/11/2011   HGB 10.7* 04/11/2011   HCT 33.8* 04/11/2011   MCV 85.1 04/11/2011   PLT 217 04/11/2011    Assessment / Plan: Augmentation of labor, progressing well  Labor: Progressing on Pitocin, will continue to increase then AROM Preeclampsia: No signs or symptoms Fetal Wellbeing:  Category I Pain Control:  Epidural I/D:  n/a Anticipated MOD:  NSVD  Luke Falero 04/11/2011, 6:04 PM

## 2011-04-11 NOTE — Progress Notes (Signed)
This note also relates to the following rows which could not be included: SpO2 - Cannot attach notes to unvalidated device data  04/11/11 2000  Vital Signs  BP 111/64 mmHg  Pulse Rate 88   Resp 18   Fetal Heart Rate A  Baseline Rate 145 bpm  Variability 6-25 BPM  Accelerations 10 x 10  Decelerations Variable;Early  Uterine Activity  Contraction Frequency (min) 2-3  Contraction Duration (sec) 60-90  Contraction Quality Strong  Resting Tone Palpated Firm  Resting Time Adequate  OB Interventions  Position Right lateral   Pushing with contractions, Dr. Domenick Bookbinder at beside.

## 2011-04-11 NOTE — MAU Note (Signed)
Pt states she went to [facility] this a.m. & noted bright red bleeding, "enought to wear a panti liner."  C/O some lower abd pain, uc's, denies LOF.

## 2011-04-11 NOTE — MAU Provider Note (Signed)
  History     CSN: [id number]  Arrival date and time: 04/11/11 0755   None     Chief Complaint  Patient presents with  . Vaginal Bleeding  . Contractions   HPI This is a 26 y.o. at [redacted]w[redacted]d who presents with c/o UCs and back pain. Was seen recently for labor eval and was 4cm with no change over time. Denies leaking but has bloody show. + fetal movement. OB History    Grav Para Term Preterm Abortions TAB SAB Ect Mult Living   3 1 1  0 1 1 0 0 0 1      Past Medical History  Diagnosis Date  . Mental disorder 2010    bipolar  . Scabies exposure 2012    treated july 2012 & reexposed    Past Surgical History  Procedure Date  . Mouth surgery     as a child    Family History  Problem Relation Age of Onset  . Anesthesia problems Neg Hx   . Hypertension Mother     History  Substance Use Topics  . Smoking status: Former Smoker    Types: Cigarettes    Quit date: 04/04/2010  . Smokeless tobacco: Never Used  . Alcohol Use: No     quit when found out was pregnant    Allergies:  Allergies  Allergen Reactions  . Penicillins Anaphylaxis  . Latex Itching and Rash    No prescriptions prior to admission    ROS As above. Denies N/V/D. No fever     + back pain and contractions Physical Exam   Blood pressure 115/77, pulse 88, temperature 97.1 F (36.2 C), temperature source Oral, resp. rate 16.  Physical Exam  Constitutional: She is oriented to person, place, and time. She appears well-developed and well-nourished. No distress.  HENT:  Head: Normocephalic.  Cardiovascular: Normal rate.   Respiratory: Effort normal.  GI: Soft. There is no tenderness.  Genitourinary: Uterus normal. Vaginal discharge (bloody show) found.       Cervix 4/70/-2/vtx/very posterior.  No change after one hour. FHR reactive, UCs every 10 minutes  Musculoskeletal: Normal range of motion.  Neurological: She is alert and oriented to person, place, and time.  Skin: Skin is warm and dry.    Psychiatric: She has a normal mood and affect.    MAU Course  Procedures  MDM Cervix unchanged from yesterday. Walked one hour. No change in cervix or contraction pattern after one hour. Refused Percocet for pain.   Assessment and Plan  A:  IUP at [redacted]w[redacted]d       Prodromal contractions with no change in cervix P:  D/C home       Labor precautions  [facility] 04/11/2011, 10:45 AM   Patient and her FOB adamantly object to being sent home. Explained elective IOL not usually done at this GA or for this reason. Will consult with Dr Adrian Blackwater Re: staying for augmentation.  Discussed with him and Dr Tye Savoy (primary MD). WIll keep for augmentation due to advanced dilation.

## 2011-04-11 NOTE — Progress Notes (Signed)
  Subjective: Patient has no complaints.  Denies painful contractions.  LUQ pain has improved after turning off Pit 2 hours ago.  Objective: BP 124/82  Pulse 81  Temp(Src) 97.9 F (36.6 C) (Oral)  Resp 18  Ht 5\' 2"  (1.575 m)  Wt 77.111 kg (170 lb)  BMI 31.09 kg/m2 I/O last 3 completed shifts: In: -  Out: 700 [Urine:700] Total I/O In: -  Out: 150 [Urine:150]  FHT:  FHR: 130s bpm, variability: moderate,  accelerations:  Present,  decelerations:  Absent UC:   regular, every 1-2  minutes SVE:   Dilation: complete Effacement (%): 100 Station: +1 Exam by:: Alessandra Sawdey  Labs: Lab Results  Component Value Date   WBC 7.4 04/11/2011   HGB 10.7* 04/11/2011   HCT 33.8* 04/11/2011   MCV 85.1 04/11/2011   PLT 217 04/11/2011    Assessment / Plan: Augmentation of labor, progressing well  Labor: Progressing normally, Pit turned off about 2 hrs ago, will continue to labor down, then consider turning down epidural to allow for better pushing. Preeclampsia:  no signs or symptoms of toxicity Fetal Wellbeing:  Category I Pain Control:  Epidural I/D:  n/a Anticipated MOD:  NSVD  DE LA CRUZ,Shivank Pinedo 04/11/2011, 9:31 PM

## 2011-04-11 NOTE — Progress Notes (Addendum)
Rachel Lloyd is a 26 y.o. G3P1011 at [redacted]w[redacted]d by LMP admitted for Early Labor/ Induction of labor  Subjective: Patient comfortable with epidural.   Objective: BP 122/76  Pulse 91  Temp(Src) 98.2 F (36.8 C) (Oral)  Resp 18  Ht 5\' 2"  (1.575 m)  Wt 77.111 kg (170 lb)  BMI 31.09 kg/m2      FHT:  FHR: 145 bpm, variability: moderate,  accelerations:  Present,  decelerations:  Absent UC:   regular, every 2-3 minutes SVE:   Dilation: 5 Effacement (%): 90 Station: -2 Exam by:: Tressia Danas RN  Labs: Lab Results  Component Value Date   WBC 7.4 04/11/2011   HGB 10.7* 04/11/2011   HCT 33.8* 04/11/2011   MCV 85.1 04/11/2011   PLT 217 04/11/2011    Assessment / Plan: Augmentation of labor, progressing well  Labor: Progressing on Pitocin, will continue to increase then AROM Preeclampsia: No signs or symptoms Fetal Wellbeing:  Category I Pain Control:  Epidural I/D:  n/a Anticipated MOD:  NSVD  Jane Birkel 04/11/2011, 4:27 PM

## 2011-04-11 NOTE — Progress Notes (Signed)
Patient ID: Rachel Lloyd, female   DOB: 1985/09/02, 26 y.o.   MRN: [id number] Rachel Lloyd is a 26 y.o. G3P1011 at [redacted]w[redacted]d admitted for elective IOL  Subjective: Comfortable.  Objective: BP 124/82  Pulse 81  Temp(Src) 97.9 F (36.6 C) (Oral)  Resp 18  Ht 5\' 2"  (1.575 m)  Wt 77.111 kg (170 lb)  BMI 31.09 kg/m2  SVE:   Dilation: Lip/rim Effacement (%): 100 Station: 0;+1 Exam by:: Diedra  EFW 8# C/C, ROP, +1 per my exam  Assessment / Plan: Labor: Progressive passive 2nd stage Fetal Wellbeing: Cat1 Pain Control:  No urge to push with dense epidural -> recommend trial or pushing by 2200 and if ineffective titrate epidural down Expected mode of delivery: NSVD  Rachel Lloyd 04/11/2011, 9:46 PM

## 2011-04-11 NOTE — H&P (Signed)
Admission for Augmentation of Labor per Dr Domenick Bookbinder  CSN: [id number]  Arrival date and time: 04/11/11 0755   None     Chief Complaint  Patient presents with  . Vaginal Bleeding  . Contractions   HPI This is a 26 y.o. at [redacted]w[redacted]d who presents with c/o UCs and back pain. Was seen recently for labor eval and was 4cm with no change over time. Denies leaking but has bloody show. + fetal movement. OB History    Grav Para Term Preterm Abortions TAB SAB Ect Mult Living   3 1 1  0 1 1 0 0 0 1      Past Medical History  Diagnosis Date  . Mental disorder 2010    bipolar  . Scabies exposure 2012    treated july 2012 & reexposed    Past Surgical History  Procedure Date  . Mouth surgery     as a child    Family History  Problem Relation Age of Onset  . Anesthesia problems Neg Hx   . Hypertension Mother     History  Substance Use Topics  . Smoking status: Former Smoker    Types: Cigarettes    Quit date: 04/04/2010  . Smokeless tobacco: Never Used  . Alcohol Use: No     quit when found out was pregnant    Allergies:  Allergies  Allergen Reactions  . Penicillins Anaphylaxis  . Latex Itching and Rash    No prescriptions prior to admission    ROS As above. Denies N/V/D. No fever     + back pain and contractions Physical Exam   Blood pressure 115/77, pulse 88, temperature 97.1 F (36.2 C), temperature source Oral, resp. rate 16.  Physical Exam  Constitutional: She is oriented to person, place, and time. She appears well-developed and well-nourished. No distress.  HENT:  Head: Normocephalic.  Cardiovascular: Normal rate.   Respiratory: Effort normal.  GI: Soft. There is no tenderness.  Genitourinary: Uterus normal. Vaginal discharge (bloody show) found.       Cervix 4/70/-2/vtx/very posterior.  No change after one hour. FHR reactive, UCs every 10 minutes  Musculoskeletal: Normal range of motion.  Neurological: She is alert and oriented to person, place, and time.    Skin: Skin is warm and dry.  Psychiatric: She has a normal mood and affect.    MAU Course  Procedures  MDM Cervix unchanged from yesterday. Walked one hour. No change in cervix or contraction pattern after one hour. Refused Percocet for pain.   Assessment and Plan  A:  IUP at [redacted]w[redacted]d       Prodromal contractions with no change in cervix P:  D/C home       Labor precautions  [facility] 04/11/2011, 10:45 AM   Patient and her FOB adamantly object to being sent home. Explained elective IOL not usually done at this GA or for this reason. Will consult with Dr Adrian Blackwater Re: staying for augmentation.  Discussed with him and Dr Tye Savoy (primary MD). WIll keep for augmentation due to advanced dilation.

## 2011-04-11 NOTE — Anesthesia Procedure Notes (Signed)
Epidural Patient location during procedure: OB Start time: 04/11/2011 3:48 PM  Staffing Anesthesiologist: Vernisha Bacote A.  Preanesthetic Checklist Completed: patient identified, site marked, surgical consent, pre-op evaluation, timeout performed, IV checked, risks and benefits discussed and monitors and equipment checked  Epidural Patient position: sitting Prep: site prepped and draped and DuraPrep Patient monitoring: continuous pulse ox and blood pressure Approach: midline Injection technique: LOR air  Needle:  Needle type: Tuohy  Needle gauge: 17 G Needle length: 9 cm Needle insertion depth: 6 cm Catheter type: closed end flexible Catheter size: 19 Gauge Catheter at skin depth: 11 cm Test dose: negative and Other  Assessment Events: blood not aspirated, injection not painful, no injection resistance, negative IV test and no paresthesia  Additional Notes Patient identified. Risks and benefits discussed including failed block, incomplete  Pain control, post dural puncture headache, nerve damage, paralysis, blood pressure Changes, nausea, vomiting, reactions to medications-both toxic and allergic and post Partum back pain. All questions were answered. Patient expressed understanding and wished to proceed. Sterile technique was used throughout procedure. Epidural site was Dressed with sterile barrier dressing. No paresthesias, signs of intravascular injection Or signs of intrathecal spread were encountered. Attempt x 2 due to poor positioning Patient was more comfortable after the epidural was dosed. Please see RN's note for documentation of vital signs and FHR which are stable.

## 2011-04-11 NOTE — Progress Notes (Signed)
Pushing stopped per Dr. Domenick Bookbinder, patient to labor down. SVE by Diedra, CNM, baby is ROP

## 2011-04-11 NOTE — Anesthesia Preprocedure Evaluation (Signed)
Anesthesia Evaluation  Patient identified by MRN, date of birth, ID band Patient awake    Reviewed: Allergy & Precautions, H&P , Patient's Chart, lab work & pertinent test results  Airway Mallampati: III TM Distance: >3 FB Neck ROM: full    Dental No notable dental hx. (+) Teeth Intact   Pulmonary Current Smoker,  breath sounds clear to auscultation  Pulmonary exam normal       Cardiovascular negative cardio ROS  Rhythm:regular Rate:Normal     Neuro/Psych PSYCHIATRIC DISORDERS Bipolar Disorder negative neurological ROS     GI/Hepatic negative GI ROS, Neg liver ROS,   Endo/Other  negative endocrine ROS  Renal/GU negative Renal ROS  negative genitourinary   Musculoskeletal   Abdominal Normal abdominal exam  (+)   Peds  Hematology negative hematology ROS (+)   Anesthesia Other Findings   Reproductive/Obstetrics (+) Pregnancy                           Anesthesia Physical Anesthesia Plan  ASA: II  Anesthesia Plan: Epidural   Post-op Pain Management:    Induction:   Airway Management Planned:   Additional Equipment:   Intra-op Plan:   Post-operative Plan:   Informed Consent: I have reviewed the patients History and Physical, chart, labs and discussed the procedure including the risks, benefits and alternatives for the proposed anesthesia with the patient or authorized representative who has indicated his/her understanding and acceptance.     Plan Discussed with: Anesthesiologist  Anesthesia Plan Comments:         Anesthesia Quick Evaluation

## 2011-04-12 ENCOUNTER — Encounter (HOSPITAL_COMMUNITY): Payer: Self-pay | Admitting: *Deleted

## 2011-04-12 LAB — CBC
HCT: 25.2 % — ABNORMAL LOW (ref 36.0–46.0)
Hemoglobin: 8.4 g/dL — ABNORMAL LOW (ref 12.0–15.0)
MCH: 27.8 pg (ref 26.0–34.0)
MCHC: 32.9 g/dL (ref 30.0–36.0)
MCV: 84.3 fL (ref 78.0–100.0)
Platelets: 199 10*3/uL (ref 150–400)
RBC: 2.99 MIL/uL — ABNORMAL LOW (ref 3.87–5.11)
RDW: 15.3 % (ref 11.5–15.5)
WBC: 16 10*3/uL — ABNORMAL HIGH (ref 4.0–10.5)

## 2011-04-12 LAB — RPR: RPR Ser Ql: NONREACTIVE

## 2011-04-12 MED ORDER — SENNOSIDES-DOCUSATE SODIUM 8.6-50 MG PO TABS
2.0000 | ORAL_TABLET | Freq: Every day | ORAL | Status: DC
  Administered 2011-04-12: 2 via ORAL

## 2011-04-12 MED ORDER — IBUPROFEN 600 MG PO TABS
600.0000 mg | ORAL_TABLET | Freq: Four times a day (QID) | ORAL | Status: DC
  Administered 2011-04-12 – 2011-04-13 (×7): 600 mg via ORAL
  Filled 2011-04-12 (×2): qty 1

## 2011-04-12 MED ORDER — TETANUS-DIPHTH-ACELL PERTUSSIS 5-2.5-18.5 LF-MCG/0.5 IM SUSP: 0.5000 mL | Freq: Once | INTRAMUSCULAR | Status: DC

## 2011-04-12 MED ORDER — BENZOCAINE-MENTHOL 20-0.5 % EX AERO
1.0000 "application " | INHALATION_SPRAY | CUTANEOUS | Status: DC | PRN
  Filled 2011-04-12: qty 56

## 2011-04-12 MED ORDER — OXYCODONE-ACETAMINOPHEN 5-325 MG PO TABS: 1.0000 | ORAL_TABLET | ORAL | Status: DC | PRN

## 2011-04-12 MED ORDER — LANOLIN HYDROUS EX OINT: TOPICAL_OINTMENT | CUTANEOUS | Status: DC | PRN

## 2011-04-12 MED ORDER — ONDANSETRON HCL 4 MG/2ML IJ SOLN: 4.0000 mg | INTRAMUSCULAR | Status: DC | PRN

## 2011-04-12 MED ORDER — WITCH HAZEL-GLYCERIN EX PADS
1.0000 "application " | MEDICATED_PAD | CUTANEOUS | Status: DC | PRN
  Administered 2011-04-12: 1 via TOPICAL

## 2011-04-12 MED ORDER — SIMETHICONE 80 MG PO CHEW: 80.0000 mg | CHEWABLE_TABLET | ORAL | Status: DC | PRN

## 2011-04-12 MED ORDER — PRENATAL MULTIVITAMIN CH
1.0000 | ORAL_TABLET | Freq: Every day | ORAL | Status: DC
  Administered 2011-04-12 – 2011-04-14 (×3): 1 via ORAL
  Filled 2011-04-12 (×3): qty 1

## 2011-04-12 MED ORDER — DIBUCAINE 1 % RE OINT
1.0000 "application " | TOPICAL_OINTMENT | RECTAL | Status: DC | PRN
  Filled 2011-04-12: qty 28

## 2011-04-12 MED ORDER — ZOLPIDEM TARTRATE 5 MG PO TABS: 5.0000 mg | ORAL_TABLET | Freq: Every evening | ORAL | Status: DC | PRN

## 2011-04-12 MED ORDER — BENZOCAINE-MENTHOL 20-0.5 % EX AERO
INHALATION_SPRAY | CUTANEOUS | Status: AC
  Administered 2011-04-12: 09:00:00
  Filled 2011-04-12: qty 56

## 2011-04-12 MED ORDER — DIPHENHYDRAMINE HCL 25 MG PO CAPS: 25.0000 mg | ORAL_CAPSULE | Freq: Four times a day (QID) | ORAL | Status: DC | PRN

## 2011-04-12 MED ORDER — ONDANSETRON HCL 4 MG PO TABS: 4.0000 mg | ORAL_TABLET | ORAL | Status: DC | PRN

## 2011-04-12 MED ORDER — MISOPROSTOL 200 MCG PO TABS
ORAL_TABLET | ORAL | Status: AC
  Filled 2011-04-12: qty 5

## 2011-04-12 NOTE — Progress Notes (Signed)
In and Out cath for 200 cc amber urine.

## 2011-04-12 NOTE — Progress Notes (Signed)
Telephone call to Dr. Marice Potter concerning patient arriving on unit with temperature of 101.2 at 0305.  No new orders given Dr. Marice Potter advised to just monitor patient's temperature for now.

## 2011-04-12 NOTE — Anesthesia Postprocedure Evaluation (Signed)
  Anesthesia Post-op Note  Patient: Rachel Lloyd  Procedure(s) Performed: * No procedures listed *  Patient Location: Women's Unit  Anesthesia Type: Epidural  Level of Consciousness: awake, alert  and oriented  Airway and Oxygen Therapy: Patient Spontanous Breathing  Post-op Pain: none  Post-op Assessment: Post-op Vital signs reviewed, Patient's Cardiovascular Status Stable, No headache, No backache, No residual numbness and No residual motor weakness  Post-op Vital Signs: Reviewed and stable  Complications: No apparent anesthesia complications

## 2011-04-12 NOTE — Progress Notes (Signed)
Post Partum Day #0 Subjective: Patient febrile overnight to 101.2; recheck 99.2. Patient has not been out of bed yet. States she feels "sore." She does not have the urge to urinate, but encouraged her to get out of bed to restroom as often as possible.   Objective: Blood pressure 118/77, pulse 105, temperature 99.2 F (37.3 C), temperature source Oral, resp. rate 20, height 5\' 2"  (1.575 m), weight 77.111 kg (170 lb), unknown if currently breastfeeding.  Physical Exam:  General: alert, cooperative and no distress Lochia: appropriate Uterine Fundus: firm, tender to palpation Incision: N/A DVT Evaluation: No evidence of DVT seen on physical exam.   Basename 04/11/11 1245  HGB 10.7*  HCT 33.8*    Assessment/Plan: Contraception Depo Discharge home in next 24-48 hours. Continue to monitor vital signs.    LOS: 1 day   Rachel Lloyd 04/12/2011, 7:24 AM

## 2011-04-12 NOTE — Progress Notes (Signed)
Patient not pushing effectively, Rachel Lloyd in to evaluate, will call [facility] for attempt of delivery with vacuum.
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
## 2011-04-12 NOTE — Progress Notes (Signed)
Waiting for epidural to wear off so patient can push effectively.

## 2011-04-13 NOTE — Progress Notes (Signed)
Post Partum Day 2 Subjective: no complaints, up ad lib, voiding and tolerating PO  Objective: Blood pressure 99/63, pulse 88, temperature 98.1 F (36.7 C), temperature source Oral, resp. rate 18, height 5\' 2"  (1.575 m), weight 170 lb (77.111 kg), unknown if currently breastfeeding.  Physical Exam:  General: alert, cooperative, appears stated age and no distress Lochia: appropriate Uterine Fundus: firm Incision: n/a DVT Evaluation: No evidence of DVT seen on physical exam. Negative Homan's sign. No cords or calf tenderness. No significant calf/ankle edema.   Basename 04/12/11 0800 04/11/11 1245  HGB 8.4* 10.7*  HCT 25.2* 33.8*    Assessment/Plan: Plan for discharge tomorrow, mod amt perineal swelling.   LOS: 2 days   Rachel Lloyd Rachel Lloyd 04/13/2011, 10:18 AM

## 2011-04-14 ENCOUNTER — Encounter: Payer: Medicaid Other | Admitting: Family Medicine

## 2011-04-14 MED ORDER — OXYCODONE-ACETAMINOPHEN 5-325 MG PO TABS: 1.0000 | ORAL_TABLET | ORAL | Status: AC | PRN

## 2011-04-14 MED ORDER — IBUPROFEN 600 MG PO TABS: 600.0000 mg | ORAL_TABLET | Freq: Four times a day (QID) | ORAL | Status: AC | PRN

## 2011-04-14 MED ORDER — FERROUS SULFATE 325 (65 FE) MG PO TABS: 325.0000 mg | ORAL_TABLET | Freq: Every day | ORAL | Status: DC

## 2011-04-14 NOTE — Discharge Summary (Signed)
Obstetric Discharge Summary Reason for Admission: onset of labor Prenatal Procedures: ultrasound Intrapartum Procedures: vacuum and PPH Postpartum Procedures: none Complications-Operative and Postpartum: hemorrhage Hemoglobin  Date Value Range Status  04/12/2011 8.4* 12.0-15.0 (g/dL) Final     REPEATED TO VERIFY     DELTA CHECK NOTED     HCT  Date Value Range Status  04/12/2011 25.2* 36.0-46.0 (%) Final    Physical Exam:  General: alert, cooperative, appears stated age and no distress Lochia: appropriate Uterine Fundus: firm Incision: n/a DVT Evaluation: No evidence of DVT seen on physical exam. Negative Homan's sign. No cords or calf tenderness. No significant calf/ankle edema.  Discharge Diagnoses: Term Pregnancy-delivered  Discharge Information: Date: 04/14/2011 Activity: pelvic rest Diet: routine Medications: PNV, Ibuprofen, Iron and Percocet Condition: stable and improved Instructions: refer to practice specific booklet Discharge to: home Follow-up Information    Follow up with [facility]. (As needed if symptoms worsen)    Contact information:   [address] [place] [address] [phone]         Newborn Data: Live born female  Birth Weight: 8 lb 2.3 oz (3695 g) APGAR: 6, 8  Home with mother.  Rachel Lloyd Rachel Lloyd 04/14/2011, 7:34 AM

## 2011-04-14 NOTE — Progress Notes (Signed)
Post Partum Day 2 Subjective: no complaints, up ad lib, voiding and tolerating PO  Objective: Blood pressure 106/67, pulse 91, temperature 98.4 F (36.9 C), temperature source Oral, resp. rate 18, height 5\' 2"  (1.575 m), weight 170 lb (77.111 kg), unknown if currently breastfeeding.  Physical Exam:  General: alert, cooperative, appears stated age and no distress Lochia: appropriate Uterine Fundus: firm Incision: n/a DVT Evaluation: No evidence of DVT seen on physical exam. Negative Homan's sign. No cords or calf tenderness. No significant calf/ankle edema.   Basename 04/12/11 0800 04/11/11 1245  HGB 8.4* 10.7*  HCT 25.2* 33.8*    Assessment/Plan: Discharge home   LOS: 3 days   Rachel Lloyd [name] 04/14/2011, 7:33 AM

## 2011-04-14 NOTE — Discharge Instructions (Signed)
Normal Labor and Delivery Your caregiver must first be sure you are in labor. Signs of labor include:  You may pass what is called "the mucus plug" before labor begins. This is a small amount of blood stained mucus.   Regular uterine contractions.   The time between contractions get closer together.   The discomfort and pain gradually gets more intense.   Pains are mostly located in the back.   Pains get worse when walking.   The cervix (the opening of the uterus becomes thinner (begins to efface) and opens up (dilates).  Once you are in labor and admitted into the hospital or care center, your caregiver will do the following:  A complete physical examination.   Check your vital signs (blood pressure, pulse, temperature and the fetal heart rate).   Do a vaginal examination (using a sterile glove and lubricant) to determine:   The position (presentation) of the baby (head [vertex] or buttock first).   The level (station) of the baby's head in the birth canal.   The effacement and dilatation of the cervix.   You may have your pubic hair shaved and be given an enema depending on your caregiver and the circumstance.   An electronic monitor is usually placed on your abdomen. The monitor follows the length and intensity of the contractions, as well as the baby's heart rate.   Usually, your caregiver will insert an IV in your arm with a bottle of sugar water. This is done as a precaution so that medications can be given to you quickly during labor or delivery.  NORMAL LABOR AND DELIVERY IS DIVIDED UP INTO 3 STAGES: First Stage This is when regular contractions begin and the cervix begins to efface and dilate. This stage can last from 3 to 15 hours. The end of the first stage is when the cervix is 100% effaced and 10 centimeters dilated. Pain medications may be given by   Injection (morphine, demerol, etc.)   Regional anesthesia (spinal, caudal or epidural, anesthetics given in  different locations of the spine). Paracervical pain medication may be given, which is an injection of and anesthetic on each side of the cervix.  A pregnant woman may request to have "Natural Childbirth" which is not to have any medications or anesthesia during her labor and delivery. Second Stage This is when the baby comes down through the birth canal (vagina) and is born. This can take 1 to 4 hours. As the baby's head comes down through the birth canal, you may feel like you are going to have a bowel movement. You will get the urge to bear down and push until the baby is delivered. As the baby's head is being delivered, the caregiver will decide if an episiotomy (a cut in the perineum and vagina area) is needed to prevent tearing of the tissue in this area. The episiotomy is sewn up after the delivery of the baby and placenta. Sometimes a mask with nitrous oxide is given for the mother to breath during the delivery of the baby to help if there is too much pain. The end of Stage 2 is when the baby is fully delivered. Then when the umbilical cord stops pulsating it is clamped and cut. Third Stage The third stage begins after the baby is completely delivered and ends after the placenta (afterbirth) is delivered. This usually takes 5 to 30 minutes. After the placenta is delivered, a medication is given either by intravenous or injection to help contract   the uterus and prevent bleeding. The third stage is not painful and pain medication is usually not necessary. If an episiotomy was done, it is repaired at this time. After the delivery, the mother is watched and monitored closely for 1 to 2 hours to make sure there is no postpartum bleeding (hemorrhage). If there is a lot of bleeding, medication is given to contract the uterus and stop the bleeding. Document Released: 10/09/2007 Document Revised: 12/19/2010 Document Reviewed: 10/09/2007 ExitCare Patient Information 2012 ExitCare, [employer]Vaginal Delivery Care  After  Change your pad on each trip to the bathroom.   Wipe gently with toilet paper during your hospital stay. Always wipe from front to back. A spray bottle with warm tap water could also be used or a towelette if available.   Place your soiled pad and toilet paper in a bathroom wastebasket with a plastic bag liner.   During your hospital stay, save any clots. If you pass a clot while on the toilet, do not flush it. Also, if your vaginal flow seems excessive to you, notify nursing personnel.   The first time you get out of bed after delivery, wait for assistance from a nurse. Do not get up alone at any time if you feel weak or dizzy.   Bend and extend your ankles forcefully so that you feel the calves of your legs get hard. Do this 6 times every hour when you are in bed and awake.   Do not sit with one foot under you, dangle your legs over the edge of the bed, or maintain a position that hinders the circulation in your legs.   Many women experience after pains for 2 to 3 days after delivery. These after pains are mild uterine contractions. Ask the nurse for a pain medication if you need something for this. Sometimes breastfeeding stimulates after pains; if you find this to be true, ask for the medication  -  hour before the next feeding.   For you and your infant's protection, do not go beyond the door(s) of the obstetric unit. Do not carry your baby in your arms in the hallway. When taking your baby to and from your room, put your baby in the bassinet and push the bassinet.   Mothers may have their babies in their room as much as they desire.  Document Released: 12/28/1999 Document Revised: 12/19/2010 Document Reviewed: 11/27/2006 [facility] Patient Information 2012 [place], [place].Marland Kitchen

## 2011-04-14 NOTE — Progress Notes (Signed)
UR chart review completed.  

## 2011-04-15 ENCOUNTER — Encounter: Payer: Medicaid Other | Admitting: Family Medicine

## 2011-04-16 ENCOUNTER — Ambulatory Visit (HOSPITAL_COMMUNITY): Admission: RE | Admit: 2011-04-16 | Payer: Self-pay | Source: Ambulatory Visit

## 2011-04-21 ENCOUNTER — Telehealth: Payer: Self-pay | Admitting: Family Medicine

## 2011-04-21 ENCOUNTER — Encounter (HOSPITAL_COMMUNITY): Payer: Self-pay | Admitting: *Deleted

## 2011-04-21 ENCOUNTER — Inpatient Hospital Stay (HOSPITAL_COMMUNITY): Admission: RE | Admit: 2011-04-21 | Payer: Medicaid Other | Source: Ambulatory Visit

## 2011-04-21 ENCOUNTER — Inpatient Hospital Stay (HOSPITAL_COMMUNITY)
Admission: AD | Admit: 2011-04-21 | Discharge: 2011-04-21 | Disposition: A | Discharge status: Home / Self Care | Payer: Medicaid Other | Source: Ambulatory Visit | Attending: Obstetrics and Gynecology | Admitting: Obstetrics and Gynecology

## 2011-04-21 ENCOUNTER — Encounter: Payer: Self-pay | Admitting: *Deleted

## 2011-04-21 DIAGNOSIS — O135 Gestational [pregnancy-induced] hypertension without significant proteinuria, complicating the puerperium: Secondary | ICD-10-CM | POA: Insufficient documentation

## 2011-04-21 DIAGNOSIS — I10 Essential (primary) hypertension: Secondary | ICD-10-CM

## 2011-04-21 LAB — URINE MICROSCOPIC-ADD ON

## 2011-04-21 LAB — CBC
HCT: 26.4 % — ABNORMAL LOW (ref 36.0–46.0)
Hemoglobin: 8.4 g/dL — ABNORMAL LOW (ref 12.0–15.0)
MCH: 27.3 pg (ref 26.0–34.0)
MCHC: 31.8 g/dL (ref 30.0–36.0)
MCV: 85.7 fL (ref 78.0–100.0)
Platelets: 320 10*3/uL (ref 150–400)
RBC: 3.08 MIL/uL — ABNORMAL LOW (ref 3.87–5.11)
RDW: 16.2 % — ABNORMAL HIGH (ref 11.5–15.5)
WBC: 6.5 10*3/uL (ref 4.0–10.5)

## 2011-04-21 LAB — COMPREHENSIVE METABOLIC PANEL
ALT: 20 U/L (ref 0–35)
AST: 17 U/L (ref 0–37)
Albumin: 3 g/dL — ABNORMAL LOW (ref 3.5–5.2)
Alkaline Phosphatase: 97 U/L (ref 39–117)
BUN: 16 mg/dL (ref 6–23)
CO2: 22 mEq/L (ref 19–32)
Calcium: 8.9 mg/dL (ref 8.4–10.5)
Chloride: 104 mEq/L (ref 96–112)
Creatinine, Ser: 0.78 mg/dL (ref 0.50–1.10)
GFR calc Af Amer: >90 mL/min (ref >90)
GFR calc non Af Amer: >90 mL/min (ref >90)
Glucose, Bld: 81 mg/dL (ref 70–99)
Potassium: 4 mEq/L (ref 3.5–5.1)
Sodium: 138 mEq/L (ref 135–145)
Total Bilirubin: 0.2 mg/dL — ABNORMAL LOW (ref 0.3–1.2)
Total Protein: 6.1 g/dL (ref 6.0–8.3)

## 2011-04-21 LAB — URINALYSIS, ROUTINE W REFLEX MICROSCOPIC
Bilirubin Urine: NEGATIVE
Glucose, UA: NEGATIVE mg/dL
Ketones, ur: NEGATIVE mg/dL
Nitrite: NEGATIVE
Protein, ur: NEGATIVE mg/dL
Specific Gravity, Urine: 1.02 (ref 1.005–1.030)
Urobilinogen, UA: 0.2 mg/dL (ref 0.0–1.0)
pH: 6 (ref 5.0–8.0)

## 2011-04-21 MED ORDER — HYDROCHLOROTHIAZIDE 25 MG PO TABS: 25.0000 mg | ORAL_TABLET | Freq: Every day | ORAL | Status: DC

## 2011-04-21 NOTE — MAU Provider Note (Signed)
  History     CSN: [id number]  Arrival date and time: 04/21/11 1840   First Provider Initiated Contact with Patient 04/21/11 1946      Chief Complaint  Patient presents with  . Hypertension  . Headache   HPI  Pt here with report of headache that started yesterday.  Reports frontal headache, rated 5/10.  Denies vision changes or epigatric pain.  Vacuum assisted delivery on 04/12/11.  No problems with preeclampsia during pregnancy, labor, or the postpartum period.    Past Medical History  Diagnosis Date  . Mental disorder 2010    bipolar  . Scabies exposure 2012    treated july 2012 & reexposed    Past Surgical History  Procedure Date  . Mouth surgery     as a child    Family History  Problem Relation Age of Onset  . Anesthesia problems Neg Hx   . Hypertension Mother     History  Substance Use Topics  . Smoking status: Former Smoker    Types: Cigarettes    Quit date: 04/04/2010  . Smokeless tobacco: Never Used  . Alcohol Use: No     quit when found out was pregnant    Allergies:  Allergies  Allergen Reactions  . Penicillins Anaphylaxis  . Latex Itching and Rash    Prescriptions prior to admission  Medication Sig Dispense Refill  . ferrous sulfate (FERROUSUL) 325 (65 FE) MG tablet Take 1 tablet (325 mg total) by mouth daily with breakfast.  30 tablet  11  . ibuprofen (ADVIL,MOTRIN) 600 MG tablet Take 1 tablet (600 mg total) by mouth every 6 (six) hours as needed for pain (pain scale < 4).  30 tablet  1  . oxyCODONE-acetaminophen (PERCOCET) 5-325 MG per tablet Take 1-2 tablets by mouth every 3 (three) hours as needed (for pain scale > 4).  30 tablet  0    ROS Physical Exam   Blood pressure 162/91, pulse 64, temperature 99.2 F (37.3 C), temperature source Oral, resp. rate 18, height 5\' 2"  (1.575 m), weight 71.271 kg (157 lb 2 oz), unknown if currently breastfeeding.  Physical Exam  MAU Course  Procedures PIH labs pending Report given to Dr.  Emelda Fear.  Assessment and Plan    [facility] 04/21/2011, 7:47 PM

## 2011-04-21 NOTE — Discharge Instructions (Signed)

## 2011-04-21 NOTE — MAU Provider Note (Signed)
History     CSN: [id number]  Arrival date and time: 04/21/11 1840   First Provider Initiated Contact with Patient 04/21/11 1946     26 y.o.G3P2012 8 days postpartum Chief Complaint  Patient presents with  . Hypertension  . Headache   Hypertension Associated symptoms include headaches. Pertinent negatives include no blurred vision, chest pain, malaise/fatigue or shortness of breath.  Headache  Pertinent negatives include no blurred vision, coughing, dizziness, fever, nausea or vomiting. Her past medical history is significant for hypertension.    Pt here with report of headache that started yesterday.  Reports frontal headache, rated 5/10.  Reports relief from h/a with ibuprofen.  Denies vision changes or epigatric pain.  Vacuum assisted delivery on 04/12/11.  No problems with preeclampsia during pregnancy, labor, or the postpartum period.    Past Medical History  Diagnosis Date  . Mental disorder 2010    bipolar  . Scabies exposure 2012    treated july 2012 & reexposed    Past Surgical History  Procedure Date  . Mouth surgery     as a child    Family History  Problem Relation Age of Onset  . Anesthesia problems Neg Hx   . Hypertension Mother     History  Substance Use Topics  . Smoking status: Former Smoker    Types: Cigarettes    Quit date: 04/04/2010  . Smokeless tobacco: Never Used  . Alcohol Use: No     quit when found out was pregnant    Allergies:  Allergies  Allergen Reactions  . Penicillins Anaphylaxis  . Latex Itching and Rash    Prescriptions prior to admission  Medication Sig Dispense Refill  . ferrous sulfate (FERROUSUL) 325 (65 FE) MG tablet Take 1 tablet (325 mg total) by mouth daily with breakfast.  30 tablet  11  . ibuprofen (ADVIL,MOTRIN) 600 MG tablet Take 1 tablet (600 mg total) by mouth every 6 (six) hours as needed for pain (pain scale < 4).  30 tablet  1  . oxyCODONE-acetaminophen (PERCOCET) 5-325 MG per tablet Take 1-2 tablets by  mouth every 3 (three) hours as needed (for pain scale > 4).  30 tablet  0    Review of Systems  Constitutional: Negative for fever, chills and malaise/fatigue.  Eyes: Negative for blurred vision.  Respiratory: Negative for cough and shortness of breath.   Cardiovascular: Negative for chest pain.  Gastrointestinal: Negative for heartburn, nausea and vomiting.  Genitourinary: Negative for dysuria, urgency and frequency.  Musculoskeletal: Negative.   Neurological: Positive for headaches. Negative for dizziness.  Psychiatric/Behavioral: Negative for depression.   Physical Exam   Blood pressure 168/86, pulse 65, temperature 99.2 F (37.3 C), temperature source Oral, resp. rate 18, height 5\' 2"  (1.575 m), weight 71.271 kg (157 lb 2 oz), unknown if currently breastfeeding.  Physical Exam  Nursing note and vitals reviewed. Constitutional: She is oriented to person, place, and time. She appears well-developed and well-nourished.  Neck: Normal range of motion.  Cardiovascular: Normal rate.   Respiratory: Effort normal.  GI: Soft.  Musculoskeletal: Normal range of motion.  Neurological: She is alert and oriented to person, place, and time. She has normal reflexes.  Skin: Skin is warm and dry.  Psychiatric: She has a normal mood and affect. Her behavior is normal. Judgment and thought content normal.    MAU Course  Procedures U/A, CBC, CMP Results for orders placed during the hospital encounter of 04/21/11 (from the past 24 hour(s))  URINALYSIS, ROUTINE  W REFLEX MICROSCOPIC     Status: Abnormal   Collection Time   04/21/11  6:57 PM      Component Value Range   Color, Urine YELLOW  YELLOW    APPearance HAZY (*) CLEAR    Specific Gravity, Urine 1.020  1.005 - 1.030    pH 6.0  5.0 - 8.0    Glucose, UA NEGATIVE  NEGATIVE (mg/dL)   Hgb urine dipstick LARGE (*) NEGATIVE    Bilirubin Urine NEGATIVE  NEGATIVE    Ketones, ur NEGATIVE  NEGATIVE (mg/dL)   Protein, ur NEGATIVE  NEGATIVE (mg/dL)    Urobilinogen, UA 0.2  0.0 - 1.0 (mg/dL)   Nitrite NEGATIVE  NEGATIVE    Leukocytes, UA SMALL (*) NEGATIVE   URINE MICROSCOPIC-ADD ON     Status: Abnormal   Collection Time   04/21/11  6:57 PM      Component Value Range   Squamous Epithelial / LPF FEW (*) RARE    WBC, UA 3-6  <3 (WBC/hpf)   RBC / HPF 21-50  <3 (RBC/hpf)   Bacteria, UA MANY (*) RARE   COMPREHENSIVE METABOLIC PANEL     Status: Abnormal   Collection Time   04/21/11  7:50 PM      Component Value Range   Sodium 138  135 - 145 (mEq/L)   Potassium 4.0  3.5 - 5.1 (mEq/L)   Chloride 104  96 - 112 (mEq/L)   CO2 22  19 - 32 (mEq/L)   Glucose, Bld 81  70 - 99 (mg/dL)   BUN 16  6 - 23 (mg/dL)   Creatinine, Ser 0.86  0.50 - 1.10 (mg/dL)   Calcium 8.9  8.4 - 57.8 (mg/dL)   Total Protein 6.1  6.0 - 8.3 (g/dL)   Albumin 3.0 (*) 3.5 - 5.2 (g/dL)   AST 17  0 - 37 (U/L)   ALT 20  0 - 35 (U/L)   Alkaline Phosphatase 97  39 - 117 (U/L)   Total Bilirubin 0.2 (*) 0.3 - 1.2 (mg/dL)   GFR calc non Af Amer >90  >90 (mL/min)   GFR calc Af Amer >90  >90 (mL/min)  CBC     Status: Abnormal   Collection Time   04/21/11  7:50 PM      Component Value Range   WBC 6.5  4.0 - 10.5 (K/uL)   RBC 3.08 (*) 3.87 - 5.11 (MIL/uL)   Hemoglobin 8.4 (*) 12.0 - 15.0 (g/dL)   HCT 46.9 (*) 62.9 - 46.0 (%)   MCV 85.7  78.0 - 100.0 (fL)   MCH 27.3  26.0 - 34.0 (pg)   MCHC 31.8  30.0 - 36.0 (g/dL)   RDW 52.8 (*) 41.3 - 15.5 (%)   Platelets 320  150 - 400 (K/uL)    Assessment and Plan  Care assumed from [place].    A: Hypertension  P: Called Dr Jolayne Panther to review assessment and labs D/C home with preeclampsia precautions HCTZ 25 mg PO daily F/U with BP check in Gyn clinic on Friday (message sent to clinic) Return to MAU as needed   [place], Joslyn Ramos 04/21/2011, 9:41 PM

## 2011-04-21 NOTE — Telephone Encounter (Signed)
Patient delivered 9 days ago . Consulted with Dr. Shawnie Pons. Advised to go to MAU for evaluation. Patient states her baby is now being circumcised. Dr. Shawnie Pons advises to go as soon as possible today. Patient voices understanding.Marland Kitchen

## 2011-04-21 NOTE — MAU Provider Note (Signed)
  History     CSN: [id number]  Arrival date and time: 04/21/11 1840   First Provider Initiated Contact with Patient 04/21/11 1946      Chief Complaint  Patient presents with  . Hypertension  . Headache   HPI  Pt is here status post a vacuum assisted vaginal delivery on 04/12/11.  Sent in today by Siri Cole nurse for elevated blood pressure.  Report frontal headache rated a 5/10.  Denies epigastric pain or visual changes.  Reports decreased bleeding and pain.    Past Medical History  Diagnosis Date  . Mental disorder 2010    bipolar  . Scabies exposure 2012    treated july 2012 & reexposed    Past Surgical History  Procedure Date  . Mouth surgery     as a child    Family History  Problem Relation Age of Onset  . Anesthesia problems Neg Hx   . Hypertension Mother     History  Substance Use Topics  . Smoking status: Former Smoker    Types: Cigarettes    Quit date: 04/04/2010  . Smokeless tobacco: Never Used  . Alcohol Use: No     quit when found out was pregnant    Allergies:  Allergies  Allergen Reactions  . Penicillins Anaphylaxis  . Latex Itching and Rash    Prescriptions prior to admission  Medication Sig Dispense Refill  . ferrous sulfate (FERROUSUL) 325 (65 FE) MG tablet Take 1 tablet (325 mg total) by mouth daily with breakfast.  30 tablet  11  . ibuprofen (ADVIL,MOTRIN) 600 MG tablet Take 1 tablet (600 mg total) by mouth every 6 (six) hours as needed for pain (pain scale < 4).  30 tablet  1  . oxyCODONE-acetaminophen (PERCOCET) 5-325 MG per tablet Take 1-2 tablets by mouth every 3 (three) hours as needed (for pain scale > 4).  30 tablet  0    Review of Systems  Musculoskeletal:       Pedal swelling  Neurological: Positive for headaches.  All other systems reviewed and are negative.   Physical Exam   Blood pressure 162/91, pulse 64, temperature 99.2 F (37.3 C), temperature source Oral, resp. rate 18, height 5\' 2"  (1.575 m), weight 71.271 kg  (157 lb 2 oz), unknown if currently breastfeeding.  Physical Exam  Constitutional: She is oriented to person, place, and time. She appears well-developed and well-nourished. No distress.  HENT:  Head: Normocephalic.  Neck: Normal range of motion. Neck supple.  Cardiovascular: Normal rate and regular rhythm.   Respiratory: Effort normal and breath sounds normal.  Genitourinary: No bleeding around the vagina.  Musculoskeletal:       2+ pedal edema  Neurological: She is alert and oriented to person, place, and time. She has normal reflexes. She displays normal reflexes.  Skin: Skin is warm and dry.    MAU Course  Procedures  PIH labs  Assessment and Plan  Report given to Dr. Konrad Dolores who assumes care of patient.    [facility] 04/21/2011, 7:54 PM

## 2011-04-21 NOTE — MAU Note (Signed)
Pt presents in MAU and is requesting that her foley catheter and leg bag be removed; states that the catheter has been in place for past 3 days; pt's urologist was Dr Marcello Fennel- but he has discharged her from his care because she missed an appointment; c/o thick,foul-smelling white discharge also;

## 2011-04-21 NOTE — MAU Note (Signed)
W. Muhammad, CNM at bedside.  Assessment done and poc discussed with pt.   

## 2011-04-21 NOTE — Progress Notes (Signed)
Dr. Konrad Dolores notified of pt request to go home.  Will be down to see pt.

## 2011-04-21 NOTE — MAU Note (Addendum)
Delivered 3/30, vaginal delivery. States baby love nurse was visiting today and told her that her BP was elevated. States Hbg checked at [facility] office and was told her Hgb was 7. States was advised by Dr. Shawnie Pons to come to MAU. States she also has a HA.

## 2011-04-21 NOTE — Telephone Encounter (Signed)
Her Rt arm bp is 154/98.  Left arm 160/100.  Some swelling to feet.  Had headache yesterday, but got better after taking motrin.  Hemoglobin is at 7 and was taking at [facility] office.  Pt's number  is [phone].

## 2011-04-21 NOTE — MAU Note (Signed)
Patient is here stating that her dr asked to come because the baby love nurse states her bp was 160/100 and low iron. She states that she started having headache on her way here because she was stressed out over her baby circumcision. She denies any visual disturbance.

## 2011-04-21 NOTE — Progress Notes (Signed)
Dr. Konrad Dolores notified of lab results back on pt.  Notified of pt ready to leave.

## 2011-04-22 NOTE — MAU Provider Note (Signed)
Agree with above note.  Rachel Lloyd 04/22/2011 7:34 AM

## 2011-04-22 NOTE — MAU Provider Note (Signed)
Agree with above note.  Rachel Lloyd 04/22/2011 7:35 AM

## 2011-04-22 NOTE — Telephone Encounter (Signed)
This encounter was created in error - please disregard.

## 2011-04-23 ENCOUNTER — Telehealth: Payer: Self-pay | Admitting: Family Medicine

## 2011-04-23 NOTE — Telephone Encounter (Signed)
Please fax letter to patient's employer stating that she has been released by you to return to work.  Fax # [phone]  Attn: Maxine Glenn

## 2011-04-24 ENCOUNTER — Encounter: Payer: Self-pay | Admitting: Family Medicine

## 2011-04-24 NOTE — Telephone Encounter (Signed)
I wrote a letter stating she can go back on 04/28/11.  She might want to take maternity leave though.  Can you please call and verify when patient would like to go back to work?  Thank you.

## 2011-04-25 ENCOUNTER — Ambulatory Visit (INDEPENDENT_AMBULATORY_CARE_PROVIDER_SITE_OTHER): Payer: Medicaid Other | Admitting: *Deleted

## 2011-04-25 ENCOUNTER — Ambulatory Visit (INDEPENDENT_AMBULATORY_CARE_PROVIDER_SITE_OTHER): Payer: Medicaid Other

## 2011-04-25 ENCOUNTER — Telehealth: Payer: Self-pay | Admitting: Family Medicine

## 2011-04-25 VITALS — BP 114/73 | HR 87

## 2011-04-25 DIAGNOSIS — Z0130 Encounter for examination of blood pressure without abnormal findings: Secondary | ICD-10-CM

## 2011-04-25 DIAGNOSIS — Z111 Encounter for screening for respiratory tuberculosis: Secondary | ICD-10-CM

## 2011-04-25 DIAGNOSIS — O165 Unspecified maternal hypertension, complicating the puerperium: Secondary | ICD-10-CM

## 2011-04-25 NOTE — Progress Notes (Signed)
Pt was seen in MAU for elevated BP's and was give HCTZ 25mg .  Today here for BP check.  BP stable and Dr. Jolayne Panther informed.  Pt advised to continue to take BP med as prescribed and that she will have a follow up at her postpartum visit.  Pt also advised to monitor her BP's at her nearest [facility] or [facility].  Pt stated understanding and had no further questions.

## 2011-04-25 NOTE — Telephone Encounter (Signed)
Disability Certification placed in Dr. Sherron Flemings Cruz's box for completion.  Rachel Lloyd

## 2011-04-25 NOTE — Telephone Encounter (Signed)
Disability Certification form to be completed by [employer].

## 2011-04-28 ENCOUNTER — Ambulatory Visit (INDEPENDENT_AMBULATORY_CARE_PROVIDER_SITE_OTHER): Payer: Medicaid Other | Admitting: *Deleted

## 2011-04-28 DIAGNOSIS — Z111 Encounter for screening for respiratory tuberculosis: Secondary | ICD-10-CM

## 2011-04-28 DIAGNOSIS — IMO0001 Reserved for inherently not codable concepts without codable children: Secondary | ICD-10-CM

## 2011-04-28 LAB — TB SKIN TEST
Induration: 0
TB Skin Test: NEGATIVE mm

## 2011-04-28 NOTE — Telephone Encounter (Signed)
Disability Certification completed and faxed to [phone].  Rachel Lloyd

## 2011-05-01 ENCOUNTER — Ambulatory Visit (INDEPENDENT_AMBULATORY_CARE_PROVIDER_SITE_OTHER): Payer: Medicaid Other | Admitting: Family Medicine

## 2011-05-01 ENCOUNTER — Encounter: Payer: Self-pay | Admitting: Family Medicine

## 2011-05-01 VITALS — BP 105/72 | Temp 98.5°F | Wt 146.0 lb

## 2011-05-01 DIAGNOSIS — B86 Scabies: Secondary | ICD-10-CM

## 2011-05-01 MED ORDER — PERMETHRIN 5 % EX CREA: TOPICAL_CREAM | Freq: Once | CUTANEOUS | Status: AC

## 2011-05-01 MED ORDER — TRIAMCINOLONE ACETONIDE 0.1 % EX CREA: TOPICAL_CREAM | Freq: Two times a day (BID) | CUTANEOUS | Status: DC

## 2011-05-01 NOTE — Patient Instructions (Signed)
Scabies Scabies are small bugs (mites) that burrow under the skin and cause red bumps and severe itching. These bugs can only be seen with a microscope. Scabies are highly contagious. They can spread easily from person to person by direct contact. They are also spread through sharing clothing or linens that have the scabies mites living in them. It is not unusual for an entire family to become infected through shared towels, clothing, or bedding.  HOME CARE INSTRUCTIONS   Your caregiver may prescribe a cream or lotion to kill the mites. If this cream is prescribed; massage the cream into the entire area of the body from the neck to the bottom of both feet. Also massage the cream into the scalp and face if your child is less than 1 year old. Avoid the eyes and mouth.   Leave the cream on for 8 to12 hours. Do not wash your hands after application. Your child should bathe or shower after the 8 to 12 hour application period. Sometimes it is helpful to apply the cream to your child at right before bedtime.   One treatment is usually effective and will eliminate approximately 95% of infestations. For severe cases, your caregiver may decide to repeat the treatment in 1 week. Everyone in your household should be treated with one application of the cream.   New rashes or burrows should not appear after successful treatment within 24 to 48 hours; however the itching and rash may last for 2 to 4 weeks after successful treatment. If your symptoms persist longer than this, see your caregiver.   Your caregiver also may prescribe a medication to help with the itching or to help the rash go away more quickly.   Scabies can live on clothing or linens for up to 3 days. Your entire child's recently used clothing, towels, stuffed toys, and bed linens should be washed in hot water and then dried in a dryer for at least 20 minutes on high heat. Items that cannot be washed should be enclosed in a plastic bag for at least 3  days.   To help relieve itching, bathe your child in a cool bath or apply cool washcloths to the affected areas.   Your child may return to school after treatment with the prescribed cream.  SEEK MEDICAL CARE IF:   The itching persists longer than 4 weeks after treatment.   The rash spreads or becomes infected (the area has red blisters or yellow-tan crust).  Document Released: 12/30/2004 Document Revised: 12/19/2010 Document Reviewed: 05/10/2008 ExitCare Patient Information 2012 [employer]. 

## 2011-05-02 ENCOUNTER — Telehealth: Payer: Self-pay | Admitting: Family Medicine

## 2011-05-02 NOTE — Telephone Encounter (Signed)
Patient is calling for a letter from yesterdays appt to be faxed to [employer] in [facility] about her Dx of Scabies.  She would like this to be done today so that she won't lose her job.  She did not know the fax #.

## 2011-05-05 DIAGNOSIS — B86 Scabies: Secondary | ICD-10-CM | POA: Insufficient documentation

## 2011-05-05 NOTE — MAU Provider Note (Signed)
Agree with above note.  Rachel Lloyd 05/05/2011 9:43 AM   

## 2011-05-05 NOTE — Progress Notes (Signed)
  Subjective:    Patient ID: Rachel Lloyd, female    DOB: 10/25/1985, 26 y.o.   MRN: [id number]  HPI  Patient presents to clinic for rash on bilateral hands and arms.  Describes rash as itchy, not painful.  Patient is constantly scratching rash on hands.  She first noticed symptoms 2 days ago.  Denies any associated fever, chills, cough, runny nose, sore throat.  Denies any exposure to poisonous plants, no new laundry detergent or new soap.  Patient's baby is also developing similar rash on his hands.     Review of Systems  Per HPI    Objective:   Physical Exam  General: in NAD HEENT: NCAT Skin: small 1mm round papular lesions on bilateral fingers, mild erythema; no open wounds or active bleeding      Assessment & Plan:

## 2011-05-05 NOTE — Assessment & Plan Note (Signed)
Distribution typical of scabies.  Will treat with Permethrin cream.  Home instructions per hand out.  Return to clinic PRN or if symptoms worsen.

## 2011-05-06 ENCOUNTER — Encounter: Payer: Self-pay | Admitting: Family Medicine

## 2011-05-06 NOTE — Telephone Encounter (Signed)
I routed a letter to my team to be faxed to [employer].

## 2011-05-19 ENCOUNTER — Ambulatory Visit: Payer: Medicaid Other | Admitting: Family Medicine

## 2011-05-21 ENCOUNTER — Encounter: Payer: Self-pay | Admitting: Family Medicine

## 2011-05-21 ENCOUNTER — Other Ambulatory Visit (HOSPITAL_COMMUNITY)
Admission: RE | Admit: 2011-05-21 | Discharge: 2011-05-21 | Disposition: A | Discharge status: Home / Self Care | Payer: Medicaid Other | Source: Ambulatory Visit | Attending: Family Medicine | Admitting: Family Medicine

## 2011-05-21 ENCOUNTER — Ambulatory Visit (INDEPENDENT_AMBULATORY_CARE_PROVIDER_SITE_OTHER): Payer: Medicaid Other | Admitting: Family Medicine

## 2011-05-21 VITALS — BP 100/68 | HR 76 | Temp 98.0°F | Wt 142.0 lb

## 2011-05-21 DIAGNOSIS — Z124 Encounter for screening for malignant neoplasm of cervix: Secondary | ICD-10-CM

## 2011-05-21 DIAGNOSIS — I10 Essential (primary) hypertension: Secondary | ICD-10-CM | POA: Insufficient documentation

## 2011-05-21 DIAGNOSIS — N760 Acute vaginitis: Secondary | ICD-10-CM

## 2011-05-21 DIAGNOSIS — N898 Other specified noninflammatory disorders of vagina: Secondary | ICD-10-CM | POA: Insufficient documentation

## 2011-05-21 DIAGNOSIS — R21 Rash and other nonspecific skin eruption: Secondary | ICD-10-CM | POA: Insufficient documentation

## 2011-05-21 DIAGNOSIS — Z01419 Encounter for gynecological examination (general) (routine) without abnormal findings: Secondary | ICD-10-CM | POA: Insufficient documentation

## 2011-05-21 DIAGNOSIS — Z113 Encounter for screening for infections with a predominantly sexual mode of transmission: Secondary | ICD-10-CM | POA: Insufficient documentation

## 2011-05-21 DIAGNOSIS — Z309 Encounter for contraceptive management, unspecified: Secondary | ICD-10-CM

## 2011-05-21 LAB — POCT WET PREP (WET MOUNT)
Clue Cells Wet Prep Whiff POC: NEGATIVE
WBC, Wet Prep HPF POC: >20

## 2011-05-21 MED ORDER — NORELGESTROMIN-ETH ESTRADIOL 150-35 MCG/24HR TD PTWK: 1.0000 | MEDICATED_PATCH | TRANSDERMAL | Status: DC

## 2011-05-21 MED ORDER — HYDROCORTISONE 2.5 % EX CREA: TOPICAL_CREAM | Freq: Two times a day (BID) | CUTANEOUS | Status: DC

## 2011-05-21 NOTE — Assessment & Plan Note (Signed)
Wet prep, gc, chlamydia performed today

## 2011-05-21 NOTE — Assessment & Plan Note (Signed)
Normal postpartum healing.  No mood changes.  Discussed Kegel exercises for mild stress incontinence.
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
## 2011-05-21 NOTE — Assessment & Plan Note (Signed)
Due to wash,  Advised stopping feminine wash, rxed 2.5% hydrocortisone as she is uncomfortable.

## 2011-05-21 NOTE — Assessment & Plan Note (Addendum)
Discussed options, had previous, declines due to pain on insertion.  Not interested in nexplanon or depo.  Will use patches as she thinks she may not remember a daily pill
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
## 2011-05-21 NOTE — Patient Instructions (Addendum)
OK to stop HCTZ  Birth control patch, use one patch each week for 3 weeks, then no patch on the 4th week.  Come back for a nurse BP check in 1-2 weeks  Ok to start exercise slowly  Do not need to use feminine washes- can cause skin irritation  See info on kegel exercises  I will send you a letter with normal results.   If you feel more sad or stressed than you think you should be, please let me know

## 2011-05-22 ENCOUNTER — Encounter: Payer: Self-pay | Admitting: Family Medicine

## 2011-06-11 ENCOUNTER — Telehealth: Payer: Self-pay | Admitting: Family Medicine
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
## 2011-06-11 NOTE — Telephone Encounter (Signed)
Pt called again asking if this has been done.

## 2011-06-11 NOTE — Telephone Encounter (Signed)
I wrote a letter back in April stating she could go back to work on 4/15.  Please call patient and ask if she needs another one.  If so, which date would like me to put on the letter?     Also, please reiterate that she needs to give MD 48 hours notice for prescriptions, notes, etc.  Thanks.

## 2011-06-12 ENCOUNTER — Encounter: Payer: Self-pay | Admitting: Family Medicine

## 2011-06-12 NOTE — Telephone Encounter (Signed)
I have written and requested letter be faxed today.  Please inform patient.  Thanks.

## 2011-06-12 NOTE — Telephone Encounter (Signed)
Called pt and her vm was not accepting calls. Rachel Lloyd, [name]

## 2011-06-12 NOTE — Telephone Encounter (Signed)
Pt needs letter to state she is ok to go back on Mon 6/3

## 2011-08-22 ENCOUNTER — Telehealth: Payer: Self-pay | Admitting: Family Medicine

## 2011-08-22 NOTE — Telephone Encounter (Signed)
Patient reports baby was born in March 30.  Had first period mid June. Period started this AM witth heavy bleeding. , clots, Has used one large overnight pad. . Now has headache , nausea and has vomited one time. Has taken Midol and hour ago with only a little relief. Consulted with Dr. Earnest Bailey and advised if pain is not improving and is severe  she can go to [facility] or [facility] ED to be evaluated.  Advised to sip on clear liquids.  Patient voices understanding.

## 2011-08-22 NOTE — Telephone Encounter (Signed)
Is having 2nd period since having baby and she is in pain all the way down her leg.  Bleeding very heavy

## 2011-11-12 ENCOUNTER — Encounter (HOSPITAL_COMMUNITY): Payer: Self-pay

## 2011-11-12 ENCOUNTER — Emergency Department (INDEPENDENT_AMBULATORY_CARE_PROVIDER_SITE_OTHER): Payer: PRIVATE HEALTH INSURANCE

## 2011-11-12 ENCOUNTER — Emergency Department (HOSPITAL_COMMUNITY)
Admission: EM | Admit: 2011-11-12 | Discharge: 2011-11-12 | Disposition: A | Discharge status: Home / Self Care | Payer: PRIVATE HEALTH INSURANCE | Source: Home / Self Care

## 2011-11-12 DIAGNOSIS — S63601A Unspecified sprain of right thumb, initial encounter: Secondary | ICD-10-CM

## 2011-11-12 DIAGNOSIS — S6390XA Sprain of unspecified part of unspecified wrist and hand, initial encounter: Secondary | ICD-10-CM
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
## 2011-11-13 NOTE — ED Provider Notes (Signed)
Medical screening examination/treatment/procedure(s) were performed by non-physician practitioner and as supervising physician I was immediately available for consultation/collaboration.   MORENO-COLL,Colston Pyle; MD   Tom Ragsdale Moreno-Coll, MD 11/13/11 0625 

## 2011-12-08 ENCOUNTER — Other Ambulatory Visit (HOSPITAL_COMMUNITY)
Admission: RE | Admit: 2011-12-08 | Discharge: 2011-12-08 | Disposition: A | Discharge status: Home / Self Care | Payer: PRIVATE HEALTH INSURANCE | Source: Ambulatory Visit | Attending: Family Medicine | Admitting: Family Medicine

## 2011-12-08 ENCOUNTER — Encounter: Payer: Self-pay | Admitting: Family Medicine

## 2011-12-08 ENCOUNTER — Ambulatory Visit (INDEPENDENT_AMBULATORY_CARE_PROVIDER_SITE_OTHER): Payer: PRIVATE HEALTH INSURANCE | Admitting: Family Medicine

## 2011-12-08 VITALS — BP 117/72 | HR 90 | Temp 98.7°F | Ht 62.0 in | Wt 164.0 lb

## 2011-12-08 DIAGNOSIS — L103 Brazilian pemphigus [fogo selvagem]: Secondary | ICD-10-CM

## 2011-12-08 DIAGNOSIS — R300 Dysuria: Secondary | ICD-10-CM

## 2011-12-08 DIAGNOSIS — R1030 Lower abdominal pain, unspecified: Secondary | ICD-10-CM | POA: Insufficient documentation

## 2011-12-08 DIAGNOSIS — L109 Pemphigus, unspecified: Secondary | ICD-10-CM

## 2011-12-08 DIAGNOSIS — N898 Other specified noninflammatory disorders of vagina: Secondary | ICD-10-CM

## 2011-12-08 DIAGNOSIS — Z113 Encounter for screening for infections with a predominantly sexual mode of transmission: Secondary | ICD-10-CM | POA: Insufficient documentation

## 2011-12-08 DIAGNOSIS — Z331 Pregnant state, incidental: Secondary | ICD-10-CM

## 2011-12-08 DIAGNOSIS — Z3490 Encounter for supervision of normal pregnancy, unspecified, unspecified trimester: Secondary | ICD-10-CM | POA: Insufficient documentation

## 2011-12-08 DIAGNOSIS — R109 Unspecified abdominal pain: Secondary | ICD-10-CM

## 2011-12-08 DIAGNOSIS — N912 Amenorrhea, unspecified: Secondary | ICD-10-CM

## 2011-12-08 LAB — POCT WET PREP (WET MOUNT)
Clue Cells Wet Prep Whiff POC: NEGATIVE
WBC, Wet Prep HPF POC: >20

## 2011-12-08 LAB — POCT URINALYSIS DIPSTICK
Bilirubin, UA: NEGATIVE
Glucose, UA: NEGATIVE
Ketones, UA: NEGATIVE
Nitrite, UA: NEGATIVE
Protein, UA: NEGATIVE
Spec Grav, UA: 1.025
Urobilinogen, UA: 0.2
pH, UA: 6

## 2011-12-08 LAB — POCT UA - MICROSCOPIC ONLY

## 2011-12-08 LAB — POCT URINE PREGNANCY: Preg Test, Ur: POSITIVE

## 2011-12-08 MED ORDER — FLUCONAZOLE 150 MG PO TABS: 150.0000 mg | ORAL_TABLET | Freq: Once | ORAL | Status: DC

## 2011-12-08 MED ORDER — CLOTRIMAZOLE 1 % EX CREA: TOPICAL_CREAM | Freq: Two times a day (BID) | CUTANEOUS | Status: DC
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
## 2011-12-08 NOTE — Patient Instructions (Addendum)
Please sign up to your new OB visit once you have your insurance determined.   Use the cream on your legs (do not put into vagina) and take the pill by mouth for your yeast infection.   If your abdominal pain worsens or you get significant vaginal bleeding, go to [facility] MAU.

## 2011-12-08 NOTE — Assessment & Plan Note (Signed)
Rx Diflucan and topical anti-fungal for rash on inner thighs.

## 2011-12-08 NOTE — Assessment & Plan Note (Signed)
She has significant yeast vulvovaginitis and found out she was pregnant today. Abdominal pain is mild and non-specific. Her urinalysis was not convincing for UTI (leukocytes but lots of epithelial cells). We will monitor abdominal pain for now. She was given indications to go to MAU (significantly worsening pain, vaginal bleeding).

## 2011-12-08 NOTE — Assessment & Plan Note (Signed)
Positive pregnancy test today. She will make initial OB visit when she gets her insurance figured out.

## 2011-12-09 ENCOUNTER — Telehealth: Payer: Self-pay | Admitting: Family Medicine

## 2011-12-09 ENCOUNTER — Encounter: Payer: Self-pay | Admitting: Family Medicine

## 2011-12-09 NOTE — Telephone Encounter (Signed)
Tried calling with results. Will send letter in stead. VM full.

## 2012-01-03 ENCOUNTER — Encounter (HOSPITAL_COMMUNITY): Payer: Self-pay | Admitting: *Deleted

## 2012-01-03 ENCOUNTER — Inpatient Hospital Stay (HOSPITAL_COMMUNITY)
Admission: AD | Admit: 2012-01-03 | Discharge: 2012-01-03 | Disposition: A | Discharge status: Home / Self Care | Payer: Medicaid Other | Source: Ambulatory Visit | Attending: Obstetrics & Gynecology | Admitting: Obstetrics & Gynecology

## 2012-01-03 DIAGNOSIS — IMO0002 Reserved for concepts with insufficient information to code with codable children: Secondary | ICD-10-CM

## 2012-01-03 DIAGNOSIS — O99891 Other specified diseases and conditions complicating pregnancy: Secondary | ICD-10-CM

## 2012-01-03 DIAGNOSIS — R1084 Generalized abdominal pain: Secondary | ICD-10-CM

## 2012-01-03 DIAGNOSIS — R51 Headache: Secondary | ICD-10-CM | POA: Insufficient documentation

## 2012-01-03 DIAGNOSIS — T7411XA Adult physical abuse, confirmed, initial encounter: Secondary | ICD-10-CM

## 2012-01-03 DIAGNOSIS — R109 Unspecified abdominal pain: Secondary | ICD-10-CM | POA: Insufficient documentation

## 2012-01-03 HISTORY — DX: Essential (primary) hypertension: I10

## 2012-01-03 LAB — POCT PREGNANCY, URINE: Preg Test, Ur: POSITIVE — AB

## 2012-01-03 LAB — URINE MICROSCOPIC-ADD ON

## 2012-01-03 LAB — URINALYSIS, ROUTINE W REFLEX MICROSCOPIC
Bilirubin Urine: NEGATIVE
Glucose, UA: NEGATIVE mg/dL
Ketones, ur: 15 mg/dL — AB
Leukocytes, UA: NEGATIVE
Nitrite: NEGATIVE
Protein, ur: NEGATIVE mg/dL
Specific Gravity, Urine: >1.03 — ABNORMAL HIGH (ref 1.005–1.030)
Urobilinogen, UA: 0.2 mg/dL (ref 0.0–1.0)
pH: 6 (ref 5.0–8.0)

## 2012-01-03 MED ORDER — ACETAMINOPHEN 500 MG PO TABS
1000.0000 mg | ORAL_TABLET | Freq: Once | ORAL | Status: AC
  Administered 2012-01-03: 1000 mg via ORAL
  Filled 2012-01-03: qty 2
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
## 2012-01-04 ENCOUNTER — Encounter (HOSPITAL_COMMUNITY): Payer: Self-pay | Admitting: Advanced Practice Midwife

## 2012-01-11 ENCOUNTER — Inpatient Hospital Stay (HOSPITAL_COMMUNITY)
Admission: AD | Admit: 2012-01-11 | Discharge: 2012-01-11 | Discharge status: Against Medical Advice | Payer: PRIVATE HEALTH INSURANCE | Source: Ambulatory Visit | Attending: Obstetrics & Gynecology | Admitting: Obstetrics & Gynecology

## 2012-01-11 NOTE — ED Notes (Signed)
Pt not in Lobby. Admitting cleck said pt told her she did not want to wait anymore. Pt had been in lobby less than 20 min.

## 2012-01-14 NOTE — L&D Delivery Note (Signed)
Delivery Note At 1913 a viable female was delivered via NSVD (Presentation: OP ;  ).  APGAR: 9, 9; weight pending.   Placenta status: spontaneous, intact.  Cord: 3 vessel with the following complications: nuchal cord x1 reduced prior to delivery of shoulders.  Cord pH: n/a  Anesthesia:  epidural Episiotomy: n/a Lacerations: 1st degree Suture Repair: 2.0 vicryl Est. Blood Loss (mL): 300  Mom to postpartum.  Baby to nursery-stable.  BOOTH, Grover Robinson 08/01/2012, 7:32 PM

## 2012-01-16 ENCOUNTER — Telehealth: Payer: Self-pay | Admitting: Family Medicine

## 2012-01-16 NOTE — Telephone Encounter (Signed)
Patient is a G3.  Was questioning if she was further along than 16 weeks since she is starting to feel the baby move.  Told her that she should be feeling the baby move between week 16 and 25 so she is probably at 16 weeks.  Advised her to discuss this with Dr. Tye Savoy next week.

## 2012-01-16 NOTE — Telephone Encounter (Signed)
Pt is 16 weeks ob and has not had any prenatal care yet.  Her appt w/ [facility] is next Thurs 1/9 - she wants to ask the nurse some questions about whether she is supposed to feel baby move yet.

## 2012-01-19 ENCOUNTER — Other Ambulatory Visit: Payer: PRIVATE HEALTH INSURANCE

## 2012-01-22 ENCOUNTER — Encounter: Payer: Self-pay | Admitting: Family Medicine

## 2012-01-22 ENCOUNTER — Ambulatory Visit (INDEPENDENT_AMBULATORY_CARE_PROVIDER_SITE_OTHER): Payer: PRIVATE HEALTH INSURANCE | Admitting: Family Medicine

## 2012-01-22 VITALS — BP 122/67 | Temp 98.9°F | Wt 168.0 lb

## 2012-01-22 DIAGNOSIS — Z3480 Encounter for supervision of other normal pregnancy, unspecified trimester: Secondary | ICD-10-CM

## 2012-01-22 DIAGNOSIS — Z3490 Encounter for supervision of normal pregnancy, unspecified, unspecified trimester: Secondary | ICD-10-CM

## 2012-01-22 DIAGNOSIS — Z331 Pregnant state, incidental: Secondary | ICD-10-CM

## 2012-01-22 NOTE — Assessment & Plan Note (Signed)
Third pregnancy.  Patient awaiting Medicaid, so will return to clinic for further work up.  Please refer to Progress Note.
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
## 2012-02-16 ENCOUNTER — Encounter (HOSPITAL_COMMUNITY): Payer: Self-pay

## 2012-02-16 ENCOUNTER — Inpatient Hospital Stay (HOSPITAL_COMMUNITY)
Admission: AD | Admit: 2012-02-16 | Discharge: 2012-02-16 | Disposition: A | Discharge status: Home / Self Care | Payer: Medicaid Other | Source: Ambulatory Visit | Attending: Obstetrics & Gynecology | Admitting: Obstetrics & Gynecology

## 2012-02-16 DIAGNOSIS — O26899 Other specified pregnancy related conditions, unspecified trimester: Secondary | ICD-10-CM

## 2012-02-16 DIAGNOSIS — N949 Unspecified condition associated with female genital organs and menstrual cycle: Secondary | ICD-10-CM | POA: Insufficient documentation

## 2012-02-16 DIAGNOSIS — O99891 Other specified diseases and conditions complicating pregnancy: Secondary | ICD-10-CM | POA: Insufficient documentation

## 2012-02-16 DIAGNOSIS — N898 Other specified noninflammatory disorders of vagina: Secondary | ICD-10-CM

## 2012-02-16 DIAGNOSIS — Z3490 Encounter for supervision of normal pregnancy, unspecified, unspecified trimester: Secondary | ICD-10-CM

## 2012-02-16 DIAGNOSIS — R109 Unspecified abdominal pain: Secondary | ICD-10-CM

## 2012-02-16 LAB — OB RESULTS CONSOLE GC/CHLAMYDIA
Chlamydia: NEGATIVE
Gonorrhea: NEGATIVE

## 2012-02-16 LAB — URINALYSIS, ROUTINE W REFLEX MICROSCOPIC
Bilirubin Urine: NEGATIVE
Glucose, UA: NEGATIVE mg/dL
Ketones, ur: 15 mg/dL — AB
Nitrite: NEGATIVE
Protein, ur: NEGATIVE mg/dL
Specific Gravity, Urine: 1.025 (ref 1.005–1.030)
Urobilinogen, UA: 0.2 mg/dL (ref 0.0–1.0)
pH: 6 (ref 5.0–8.0)

## 2012-02-16 LAB — WET PREP, GENITAL
Clue Cells Wet Prep HPF POC: NONE SEEN
Trich, Wet Prep: NONE SEEN
Yeast Wet Prep HPF POC: NONE SEEN

## 2012-02-16 LAB — URINE MICROSCOPIC-ADD ON

## 2012-02-16 MED ORDER — PRENATAL VITAMINS 28-0.8 MG PO TABS: 1.0000 | ORAL_TABLET | Freq: Every day | ORAL | Status: DC

## 2012-02-16 NOTE — Progress Notes (Signed)
Chart reviewed.  Patient needs to have prenatal labs performed in order that we may provide her optimal prenatal care.  Consider CSW consult to assist [place] in assessing her Medicaid eligibility, as well as other resources given her social situation and stressors. Paula Compton, MD

## 2012-02-16 NOTE — MAU Provider Note (Signed)

## 2012-02-16 NOTE — MAU Note (Signed)
Patient states she has had no prenatal care but plans to go to [facility]. Patient states she has been cramping for a least a month. States she has had a vaginal discharge that is white and beginning to have an odor. Has felt fetal movement.

## 2012-02-16 NOTE — MAU Provider Note (Signed)
Chief Complaint:  Abdominal Cramping and Vaginal Discharge   None     HPI: Rachel Lloyd is a 27 y.o. [id number] at 34w1dwho presents to maternity admissions reporting vaginal discharge with odor and abdominal cramping.  She has appointment on Thursday to begin prenatal care with [facility].  She reports good fetal movement, denies LOF, vaginal bleeding, vaginal itching/burning, urinary symptoms, h/a, dizziness, n/v, or fever/chills.       Past Medical History: Past Medical History  Diagnosis Date  . Mental disorder 2010    bipolar  . Scabies exposure 2012    treated july 2012 & reexposed  . Hypertension   . Headache     Past obstetric history: OB History    Grav Para Term Preterm Abortions TAB SAB Ect Mult Living   4 2 2  0 1 1 0 0 0 2     # Outc Date GA Lbr Len/2nd Wgt Sex Del Anes PTL Lv   1 TRM 4/06 [redacted]w[redacted]d  6lb14oz(3.118kg) M SVD EPI No Yes   2 TAB 1/08      None  No   Comments: Pt reports she had a TAB at 24 weeks in [place].  Denies any fetal anomalies.     3 TRM 3/13 [redacted]w[redacted]d 12:00 / 04:38 8lb2.3oz(3.695kg) M VAC EPI  Yes   Comments: caput   4 CUR               Past Surgical History: Past Surgical History  Procedure Date  . Mouth surgery     as a child  . Wisdom tooth extraction     Family History: Family History  Problem Relation Age of Onset  . Anesthesia problems Neg Hx   . Hypertension Mother     Social History: History  Substance Use Topics  . Smoking status: Former Smoker    Types: Cigarettes    Quit date: 04/04/2010  . Smokeless tobacco: Never Used  . Alcohol Use: No     Comment: quit when found out was pregnant    Allergies:  Allergies  Allergen Reactions  . Penicillins Anaphylaxis  . Latex Itching and Rash    Meds:  Prescriptions prior to admission  Medication Sig Dispense Refill  . acetaminophen (TYLENOL) 500 MG tablet Take 1,000 mg by mouth every 6 (six) hours as needed. For pain        ROS: Pertinent findings in history of  present illness.  Physical Exam  Blood pressure 119/63, pulse 103, temperature 98.5 F (36.9 C), temperature source Oral, resp. rate 16, height 5\' 1"  (1.549 m), weight 79.017 kg (174 lb 3.2 oz), last menstrual period 09/28/2011, SpO2 100.00%. GENERAL: Well-developed, well-nourished female in no acute distress.  HEENT: normocephalic HEART: normal rate RESP: normal effort ABDOMEN: Soft, non-tender, gravid appropriate for gestational age EXTREMITIES: Nontender, no edema NEURO: alert and oriented Pelvic exam: Cervix pink, visually closed, without lesion, scant white creamy discharge, vaginal walls and external genitalia normal Bimanual exam: Cervix 0/long/high, firm, posterior    FHT:  144 by doppler   Labs: Results for orders placed during the hospital encounter of 02/16/12 (from the past 24 hour(s))  URINALYSIS, ROUTINE W REFLEX MICROSCOPIC     Status: Abnormal   Collection Time   02/16/12 11:35 AM      Component Value Range   Color, Urine YELLOW  YELLOW   APPearance HAZY (*) CLEAR   Specific Gravity, Urine 1.025  1.005 - 1.030   pH 6.0  5.0 - 8.0  Glucose, UA NEGATIVE  NEGATIVE mg/dL   Hgb urine dipstick TRACE (*) NEGATIVE   Bilirubin Urine NEGATIVE  NEGATIVE   Ketones, ur 15 (*) NEGATIVE mg/dL   Protein, ur NEGATIVE  NEGATIVE mg/dL   Urobilinogen, UA 0.2  0.0 - 1.0 mg/dL   Nitrite NEGATIVE  NEGATIVE   Leukocytes, UA TRACE (*) NEGATIVE  URINE MICROSCOPIC-ADD ON     Status: Abnormal   Collection Time   02/16/12 11:35 AM      Component Value Range   Squamous Epithelial / LPF MANY (*) RARE   WBC, UA 7-10  <3 WBC/hpf   RBC / HPF 3-6  <3 RBC/hpf   Bacteria, UA MANY (*) RARE   Urine-Other MUCOUS PRESENT    WET PREP, GENITAL     Status: Abnormal   Collection Time   02/16/12  3:23 PM      Component Value Range   Yeast Wet Prep HPF POC NONE SEEN  NONE SEEN   Trich, Wet Prep NONE SEEN  NONE SEEN   Clue Cells Wet Prep HPF POC NONE SEEN  NONE SEEN   WBC, Wet Prep HPF POC MANY (*)  NONE SEEN   Assessment: 1. Vaginal discharge in pregnancy   2. Normal IUP (intrauterine pregnancy) on prenatal ultrasound    Plan: Discharge home Outpatient U/S ordered for anatomy scan F/U as scheduled with Family Practice for prenatal care Return to MAU as needed    Medication List     As of 02/16/2012  3:33 PM    ASK your doctor about these medications         acetaminophen 500 MG tablet   Commonly known as: TYLENOL   Take 1,000 mg by mouth every 6 (six) hours as needed. For pain        Sharen Counter Certified Nurse-Midwife 02/16/2012 3:33 PM

## 2012-02-17 LAB — GC/CHLAMYDIA PROBE AMP
CT Probe RNA: NEGATIVE
GC Probe RNA: NEGATIVE

## 2012-02-18 ENCOUNTER — Ambulatory Visit (HOSPITAL_COMMUNITY)
Admission: RE | Admit: 2012-02-18 | Discharge: 2012-02-18 | Disposition: A | Discharge status: Home / Self Care | Payer: Medicaid Other | Source: Ambulatory Visit | Attending: Advanced Practice Midwife | Admitting: Advanced Practice Midwife

## 2012-02-18 ENCOUNTER — Encounter (HOSPITAL_COMMUNITY): Payer: Self-pay

## 2012-02-18 DIAGNOSIS — Z363 Encounter for antenatal screening for malformations: Secondary | ICD-10-CM | POA: Insufficient documentation

## 2012-02-18 DIAGNOSIS — Z3490 Encounter for supervision of normal pregnancy, unspecified, unspecified trimester: Secondary | ICD-10-CM

## 2012-02-18 DIAGNOSIS — Z1389 Encounter for screening for other disorder: Secondary | ICD-10-CM | POA: Insufficient documentation

## 2012-02-18 LAB — URINE CULTURE: Colony Count: >=100000

## 2012-02-19 ENCOUNTER — Ambulatory Visit (INDEPENDENT_AMBULATORY_CARE_PROVIDER_SITE_OTHER): Payer: Medicaid Other | Admitting: Family Medicine

## 2012-02-19 ENCOUNTER — Encounter: Payer: Self-pay | Admitting: Family Medicine

## 2012-02-19 VITALS — BP 114/66 | Temp 98.1°F | Wt 173.4 lb

## 2012-02-19 DIAGNOSIS — Z3480 Encounter for supervision of other normal pregnancy, unspecified trimester: Secondary | ICD-10-CM

## 2012-02-19 DIAGNOSIS — Z3490 Encounter for supervision of normal pregnancy, unspecified, unspecified trimester: Secondary | ICD-10-CM

## 2012-02-19 MED ORDER — NITROFURANTOIN MONOHYD MACRO 100 MG PO CAPS: 100.0000 mg | ORAL_CAPSULE | Freq: Two times a day (BID) | ORAL | Status: DC

## 2012-02-19 MED ORDER — HYDROCORTISONE 2.5 % EX CREA: TOPICAL_CREAM | Freq: Two times a day (BID) | CUTANEOUS | Status: DC

## 2012-02-19 MED ORDER — CEPHALEXIN 500 MG PO CAPS: 500.0000 mg | ORAL_CAPSULE | Freq: Three times a day (TID) | ORAL | Status: DC

## 2012-02-19 NOTE — Patient Instructions (Addendum)
Return to clinic in 4 weeks.  Remember to take your prenatal vitamin EVERYDAY. Take MACROBID twice per day x 7 days for UTI. Pregnancy - Second Trimester The second trimester is the period between 13 to 27 weeks of your pregnancy. It is important to follow your doctor's instructions. HOME CARE   Do not smoke.  Do not drink alcohol or use drugs.  Only take medicine as told by your doctor.  Take prenatal vitamins as told. The vitamin should contain 1 milligram of folic acid.  Exercise.  Eat healthy foods. Eat regular, well-balanced meals.  You can have sex (intercourse) if there are no other problems with the pregnancy.  Do not use hot tubs, steam rooms, or saunas.  Wear a seat belt while driving.  Avoid raw meat, uncooked cheese, and litter boxes and soil used by cats.  Visit your dentist. Shirlee Limerick are okay. GET HELP RIGHT AWAY IF:   You have a temperature by mouth above 102 F (38.9 C), not controlled by medicine.  Fluid is coming from your vagina.  Blood is coming from your vagina. Light spotting is common, especially after sex (intercourse).  You have a bad smelling fluid (discharge) coming from the vagina. The fluid changes from clear to white.  You still feel sick to your stomach (nauseous).  You throw up (vomit) blood.  You lose or gain more than 2 pounds (0.9 kilograms) of weight in a week, or as suggested by your doctor.  Your face, hands, feet, or legs get puffy (swell).  You get exposed to [place] measles and have never had them.  You get exposed to fifth disease or chickenpox.  You have belly (abdominal) pain.  You have a bad headache that will not go away.  You have watery poop (diarrhea), pain when you pee (urinate), or have shortness of breath.  You start to have problems seeing (blurry or double vision).  You fall, are in a car accident, or have any kind of trauma.  There is mental or physical violence at home.  You have any concerns or  worries during your pregnancy. MAKE SURE YOU:   Understand these instructions.  Will watch your condition.  Will get help right away if you are not doing well or get worse. Document Released: 03/26/2009 Document Revised: 03/24/2011 Document Reviewed: 03/26/2009 [employer] Patient Information 2013 [place], [place].

## 2012-02-19 NOTE — Progress Notes (Signed)
Patient returns to clinic for OB visit.  She went to MAU 3 days ago for vaginal discharge and foul odor.  She also endorsed abdominal pain.  She was tested for GC/chlamydia and wet prep all negative.  Urine culture positive for E.coli.   Today patient complains of bilateral breast pain.  Described as sharp pains.  No skin change or signs of infection.  No fevers or vomiting.  No vaginal bleeding, no early contractions.  O:  See vitals notes Breast: palpated fibrocystic changes but no masses, no evidence of infection Abdomen: gravid, soft, ND  A/P: 1. Advised patient to take pre-natal vitamins DAILY  2. OB panel and HIV tests ordered today to complete prenatal labs 3. Macrobid for UTI 4. Will need flu shot if not already received 5. Will need follow up in OB clinic during second trimester 7. Preterm labor precautions reviewed

## 2012-02-20 LAB — OBSTETRIC PANEL
Antibody Screen: NEGATIVE
Basophils Absolute: 0 10*3/uL (ref 0.0–0.1)
Basophils Relative: 0 % (ref 0–1)
Eosinophils Absolute: 0.1 10*3/uL (ref 0.0–0.7)
Eosinophils Relative: 1 % (ref 0–5)
HCT: 33.1 % — ABNORMAL LOW (ref 36.0–46.0)
Hemoglobin: 11.2 g/dL — ABNORMAL LOW (ref 12.0–15.0)
Hepatitis B Surface Ag: NEGATIVE
Lymphocytes Relative: 17 % (ref 12–46)
Lymphs Abs: 1.3 10*3/uL (ref 0.7–4.0)
MCH: 29 pg (ref 26.0–34.0)
MCHC: 33.8 g/dL (ref 30.0–36.0)
MCV: 85.8 fL (ref 78.0–100.0)
Monocytes Absolute: 0.3 10*3/uL (ref 0.1–1.0)
Monocytes Relative: 4 % (ref 3–12)
Neutro Abs: 6.3 10*3/uL (ref 1.7–7.7)
Neutrophils Relative %: 78 % — ABNORMAL HIGH (ref 43–77)
Platelets: 267 10*3/uL (ref 150–400)
RBC: 3.86 MIL/uL — ABNORMAL LOW (ref 3.87–5.11)
RDW: 15.2 % (ref 11.5–15.5)
Rh Type: POSITIVE
Rubella: 5.45 Index — ABNORMAL HIGH (ref <0.90)
WBC: 8.1 10*3/uL (ref 4.0–10.5)

## 2012-02-20 LAB — HIV ANTIBODY (ROUTINE TESTING W REFLEX): HIV: NONREACTIVE

## 2012-02-23 ENCOUNTER — Telehealth: Payer: Self-pay | Admitting: Family Medicine

## 2012-02-23 NOTE — Telephone Encounter (Signed)
Patient reports  Cold symptoms runny nose,  headache, cough, gums itch. Consulted with Dr. Earnest Bailey and she advises can  take Tylenol, saline nasal spray. Cool mist humifier.

## 2012-02-23 NOTE — Telephone Encounter (Signed)
Patient has a cold and is pregnant and wants to know what is ok otc for her to take.

## 2012-03-24 ENCOUNTER — Encounter: Payer: Medicaid Other | Admitting: Family Medicine

## 2012-03-31 ENCOUNTER — Ambulatory Visit (INDEPENDENT_AMBULATORY_CARE_PROVIDER_SITE_OTHER): Payer: Medicaid Other | Admitting: Family Medicine

## 2012-03-31 VITALS — BP 121/79 | Temp 98.7°F | Wt 174.0 lb

## 2012-03-31 DIAGNOSIS — Z3492 Encounter for supervision of normal pregnancy, unspecified, second trimester: Secondary | ICD-10-CM

## 2012-03-31 DIAGNOSIS — F319 Bipolar disorder, unspecified: Secondary | ICD-10-CM

## 2012-03-31 DIAGNOSIS — Z3480 Encounter for supervision of other normal pregnancy, unspecified trimester: Secondary | ICD-10-CM

## 2012-03-31 MED ORDER — FLUCONAZOLE 150 MG PO TABS: 150.0000 mg | ORAL_TABLET | Freq: Once | ORAL | Status: DC

## 2012-03-31 NOTE — Assessment & Plan Note (Signed)
After discussing with patient, will not treat with medication at this time.  She does not want to risk harming her baby.  However, I told her that if she does develop thoughts of harming herself or baby, she will need to report to ER and will likely be started on medication for bipolar disorder.  She understands.  For now, I plan on doing serial MDQ and PHQ screening tests at every visit to ensure mental stability.  Follow up with OB clinic next month.

## 2012-03-31 NOTE — Patient Instructions (Addendum)
If you develop any thoughts of hurting yourself or your baby, please call your doctor or go to ER. We scheduled your follow up ultrasound. If you develop any vaginal bleeding, discharge, early contractions, or decreased baby movements, please report to MAU. Schedule follow up OB visit with Dr. Mauricio Lloyd in [facility] clinic NEXT month (APRIL).  Pregnancy - Second Trimester The second trimester of pregnancy (3 to 6 months) is a period of rapid growth for you and your baby. At the end of the sixth month, your baby is about 9 inches long and weighs 1 1/2 pounds. You will begin to feel the baby move between 18 and 20 weeks of the pregnancy. This is called quickening. Weight gain is faster. A clear fluid (colostrum) may leak out of your breasts. You may feel small contractions of the womb (uterus). This is known as false labor or Braxton-Hicks contractions. This is like a practice for labor when the baby is ready to be born. Usually, the problems with morning sickness have usually passed by the end of your first trimester. Some women develop small dark blotches (called cholasma, mask of pregnancy) on their face that usually goes away after the baby is born. Exposure to the sun makes the blotches worse. Acne may also develop in some pregnant women and pregnant women who have acne, may find that it goes away. PRENATAL EXAMS  Blood work may continue to be done during prenatal exams. These tests are done to check on your health and the probable health of your baby. Blood work is used to follow your blood levels (hemoglobin). Anemia (low hemoglobin) is common during pregnancy. Iron and vitamins are given to help prevent this. You will also be checked for diabetes between 24 and 28 weeks of the pregnancy. Some of the previous blood tests may be repeated.  The size of the uterus is measured during each visit. This is to make sure that the baby is continuing to grow properly according to the dates of the pregnancy.  Your blood  pressure is checked every prenatal visit. This is to make sure you are not getting toxemia.  Your urine is checked to make sure you do not have an infection, diabetes or protein in the urine.  Your weight is checked often to make sure gains are happening at the suggested rate. This is to ensure that both you and your baby are growing normally.  Sometimes, an ultrasound is performed to confirm the proper growth and development of the baby. This is a test which bounces harmless sound waves off the baby so your caregiver can more accurately determine due dates. Sometimes, a specialized test is done on the amniotic fluid surrounding the baby. This test is called an amniocentesis. The amniotic fluid is obtained by sticking a needle into the belly (abdomen). This is done to check the chromosomes in instances where there is a concern about possible genetic problems with the baby. It is also sometimes done near the end of pregnancy if an early delivery is required. In this case, it is done to help make sure the baby's lungs are mature enough for the baby to live outside of the womb. CHANGES OCCURING IN THE SECOND TRIMESTER OF PREGNANCY Your body goes through many changes during pregnancy. They vary from person to person. Talk to your caregiver about changes you notice that you are concerned about.  During the second trimester, you will likely have an increase in your appetite. It is normal to have cravings for  certain foods. This varies from person to person and pregnancy to pregnancy.  Your lower abdomen will begin to bulge.  You may have to urinate more often because the uterus and baby are pressing on your bladder. It is also common to get more bladder infections during pregnancy (pain with urination). You can help this by drinking lots of fluids and emptying your bladder before and after intercourse.  You may begin to get stretch marks on your hips, abdomen, and breasts. These are normal changes in the  body during pregnancy. There are no exercises or medications to take that prevent this change.  You may begin to develop swollen and bulging veins (varicose veins) in your legs. Wearing support hose, elevating your feet for 15 minutes, 3 to 4 times a day and limiting salt in your diet helps lessen the problem.  Heartburn may develop as the uterus grows and pushes up against the stomach. Antacids recommended by your caregiver helps with this problem. Also, eating smaller meals 4 to 5 times a day helps.  Constipation can be treated with a stool softener or adding bulk to your diet. Drinking lots of fluids, vegetables, fruits, and whole grains are helpful.  Exercising is also helpful. If you have been very active up until your pregnancy, most of these activities can be continued during your pregnancy. If you have been less active, it is helpful to start an exercise program such as walking.  Hemorrhoids (varicose veins in the rectum) may develop at the end of the second trimester. Warm sitz baths and hemorrhoid cream recommended by your caregiver helps hemorrhoid problems.  Backaches may develop during this time of your pregnancy. Avoid heavy lifting, wear low heal shoes and practice good posture to help with backache problems.  Some pregnant women develop tingling and numbness of their hand and fingers because of swelling and tightening of ligaments in the wrist (carpel tunnel syndrome). This goes away after the baby is born.  As your breasts enlarge, you may have to get a bigger bra. Get a comfortable, cotton, support bra. Do not get a nursing bra until the last month of the pregnancy if you will be nursing the baby.  You may get a dark line from your belly button to the pubic area called the linea nigra.  You may develop rosy cheeks because of increase blood flow to the face.  You may develop spider looking lines of the face, neck, arms and chest. These go away after the baby is born. HOME CARE  INSTRUCTIONS   It is extremely important to avoid all smoking, herbs, alcohol, and unprescribed drugs during your pregnancy. These chemicals affect the formation and growth of the baby. Avoid these chemicals throughout the pregnancy to ensure the delivery of a healthy infant.  Most of your home care instructions are the same as suggested for the first trimester of your pregnancy. Keep your caregiver's appointments. Follow your caregiver's instructions regarding medication use, exercise and diet.  During pregnancy, you are providing food for you and your baby. Continue to eat regular, well-balanced meals. Choose foods such as meat, fish, milk and other low fat dairy products, vegetables, fruits, and whole-grain breads and cereals. Your caregiver will tell you of the ideal weight gain.  A physical sexual relationship may be continued up until near the end of pregnancy if there are no other problems. Problems could include early (premature) leaking of amniotic fluid from the membranes, vaginal bleeding, abdominal pain, or other medical or pregnancy problems.  Exercise regularly if there are no restrictions. Check with your caregiver if you are unsure of the safety of some of your exercises. The greatest weight gain will occur in the last 2 trimesters of pregnancy. Exercise will help you:  Control your weight.  Get you in shape for labor and delivery.  Lose weight after you have the baby.  Wear a good support or jogging bra for breast tenderness during pregnancy. This may help if worn during sleep. Pads or tissues may be used in the bra if you are leaking colostrum.  Do not use hot tubs, steam rooms or saunas throughout the pregnancy.  Wear your seat belt at all times when driving. This protects you and your baby if you are in an accident.  Avoid raw meat, uncooked cheese, cat litter boxes and soil used by cats. These carry germs that can cause birth defects in the baby.  The second trimester  is also a good time to visit your dentist for your dental health if this has not been done yet. Getting your teeth cleaned is OK. Use a soft toothbrush. Brush gently during pregnancy.  It is easier to loose urine during pregnancy. Tightening up and strengthening the pelvic muscles will help with this problem. Practice stopping your urination while you are going to the bathroom. These are the same muscles you need to strengthen. It is also the muscles you would use as if you were trying to stop from passing gas. You can practice tightening these muscles up 10 times a set and repeating this about 3 times per day. Once you know what muscles to tighten up, do not perform these exercises during urination. It is more likely to contribute to an infection by backing up the urine.  Ask for help if you have financial, counseling or nutritional needs during pregnancy. Your caregiver will be able to offer counseling for these needs as well as refer you for other special needs.  Your skin may become oily. If so, wash your face with mild soap, use non-greasy moisturizer and oil or cream based makeup. MEDICATIONS AND DRUG USE IN PREGNANCY  Take prenatal vitamins as directed. The vitamin should contain 1 milligram of folic acid. Keep all vitamins out of reach of children. Only a couple vitamins or tablets containing iron may be fatal to a baby or young child when ingested.  Avoid use of all medications, including herbs, over-the-counter medications, not prescribed or suggested by your caregiver. Only take over-the-counter or prescription medicines for pain, discomfort, or fever as directed by your caregiver. Do not use aspirin.  Let your caregiver also know about herbs you may be using.  Alcohol is related to a number of birth defects. This includes fetal alcohol syndrome. All alcohol, in any form, should be avoided completely. Smoking will cause low birth rate and premature babies.  Street or illegal drugs are  very harmful to the baby. They are absolutely forbidden. A baby born to an addicted mother will be addicted at birth. The baby will go through the same withdrawal an adult does. SEEK MEDICAL CARE IF:  You have any concerns or worries during your pregnancy. It is better to call with your questions if you feel they cannot wait, rather than worry about them. SEEK IMMEDIATE MEDICAL CARE IF:   An unexplained oral temperature above 102 F (38.9 C) develops, or as your caregiver suggests.  You have leaking of fluid from the vagina (birth canal). If leaking membranes are suspected, take your  temperature and tell your caregiver of this when you call.  There is vaginal spotting, bleeding, or passing clots. Tell your caregiver of the amount and how many pads are used. Light spotting in pregnancy is common, especially following intercourse.  You develop a bad smelling vaginal discharge with a change in the color from clear to white.  You continue to feel sick to your stomach (nauseated) and have no relief from remedies suggested. You vomit blood or coffee ground-like materials.  You lose more than 2 pounds of weight or gain more than 2 pounds of weight over 1 week, or as suggested by your caregiver.  You notice swelling of your face, hands, feet, or legs.  You get exposed to [place] measles and have never had them.  You are exposed to fifth disease or chickenpox.  You develop belly (abdominal) pain. Round ligament discomfort is a common non-cancerous (benign) cause of abdominal pain in pregnancy. Your caregiver still must evaluate you.  You develop a bad headache that does not go away.  You develop fever, diarrhea, pain with urination, or shortness of breath.  You develop visual problems, blurry, or double vision.  You fall or are in a car accident or any kind of trauma.  There is mental or physical violence at home. Document Released: 12/24/2000 Document Revised: 03/24/2011 Document Reviewed:  06/28/2008 [facility] Patient Information 2013 [place], [place].

## 2012-03-31 NOTE — Progress Notes (Signed)
Patient returns to clinic for OB visit.  She complains of depressed mood, decreased pleasure in activities, and confusion/disorientation (i.e. She got lost on her way to [facility] today).  She has a hx of bipolar disorder and used to take Depakote, but stopped because she felt "better."  Patient denies any suicidal or homicidal thoughts, but she does get very angry at other people.  She never has any negative thoughts toward herself or baby.  She also does not want to take any medications that could harm fetus.  Patient is not interested in speaking to a counselor at this time.  Patient reports good fetal activity.  No vaginal bleeding, no early contractions.   O:  See vitals notes  PHQ score: 11 MDQ score: 6 YES, 10 NO  A/P:  - MDQ and PHQ performed today, will need to do this at EVERY visit to monitor for any red flags - Advised patient to take pre-natal gummies DAILY  - Last [place] recommended follow up for discordant dates, will order this today - Plans to get tubal ligation after delivery - Will need follow up in OB clinic next month - Preterm labor precautions reviewed

## 2012-04-02 ENCOUNTER — Ambulatory Visit (HOSPITAL_COMMUNITY)
Admission: RE | Admit: 2012-04-02 | Discharge: 2012-04-02 | Disposition: A | Discharge status: Home / Self Care | Payer: Medicaid Other | Source: Ambulatory Visit | Attending: Family Medicine | Admitting: Family Medicine

## 2012-04-02 ENCOUNTER — Encounter (HOSPITAL_COMMUNITY): Payer: Self-pay

## 2012-04-02 DIAGNOSIS — Z3492 Encounter for supervision of normal pregnancy, unspecified, second trimester: Secondary | ICD-10-CM

## 2012-04-02 DIAGNOSIS — O365990 Maternal care for other known or suspected poor fetal growth, unspecified trimester, not applicable or unspecified: Secondary | ICD-10-CM | POA: Insufficient documentation

## 2012-04-02 DIAGNOSIS — Z3689 Encounter for other specified antenatal screening: Secondary | ICD-10-CM | POA: Insufficient documentation

## 2012-04-13 ENCOUNTER — Telehealth: Payer: Self-pay | Admitting: Family Medicine

## 2012-04-13 NOTE — Telephone Encounter (Signed)
OB w/ leg pain and wants to know what she can do.  Not sure if she needs to come in.

## 2012-04-13 NOTE — Telephone Encounter (Signed)
Returned call to patient.  C/o "feeling flushed and tired" and has right leg pain radiating into buttock since yesterday.  Pain was "sharp" yesterday and not sure if it's "due to how baby is sitting."  Requesting appt for tomorrow.  Appt scheduled for tomorrow with Dr. Gwendolyn Grant at 2:15 pm.  Patient informed to go to [facility] if pain worsens and she is unable to wait until tomorrow afternoon.  Patient agreeable.  Gaylene Brooks, RN

## 2012-04-14 ENCOUNTER — Ambulatory Visit: Payer: Medicaid Other | Admitting: Family Medicine

## 2012-04-16 ENCOUNTER — Ambulatory Visit (INDEPENDENT_AMBULATORY_CARE_PROVIDER_SITE_OTHER): Payer: Medicaid Other | Admitting: Family Medicine

## 2012-04-16 VITALS — BP 96/50 | Temp 99.1°F | Wt 176.0 lb

## 2012-04-16 DIAGNOSIS — M5431 Sciatica, right side: Secondary | ICD-10-CM

## 2012-04-16 DIAGNOSIS — Z3480 Encounter for supervision of other normal pregnancy, unspecified trimester: Secondary | ICD-10-CM

## 2012-04-16 DIAGNOSIS — M5430 Sciatica, unspecified side: Secondary | ICD-10-CM

## 2012-04-16 NOTE — Progress Notes (Signed)
Patient complains of RT side hip pain that only occurs with walking and at night when she stretches out leg.  Pain starts at RT sacrum and radiates to leg.  Denies any numbness or tingling of RT leg, but she limps at times due to pain.  Denies any difficulty with urination.    Good fetal movement.  No vaginal bleeding, discharge.  Exam: MSK - hip, right: Strength IR: 5/5, ER: 5/5, Flexion: 5/5, Extension: 5/5 Pelvic alignment unremarkable to inspection and palpation. Standing hip rotation and gait without trendelenburg / unsteadiness. No tenderness over piriformis and greater trochanter. Mild SI joint tenderness. Limited SLR due to pain bilaterally, about 45 degrees on LT and 30 degrees on RT. Normal gait.  A/P: See Problem List

## 2012-04-16 NOTE — Assessment & Plan Note (Signed)
RT hip pain likely secondary to sciatica in pregnancy/pelvic girdle pain.  Discussed conservative management with pelvic girdle that she can purchase from [employer] [place] and home exercises for sciatica.  Handout given with exercises. Discussed with Dr. Lum Babe who also recommended PT if home exercises do not work.  She has an appointment at [facility] in 2 weeks.  Follow up with me in 6 weeks.

## 2012-04-16 NOTE — Patient Instructions (Addendum)
Please go to [facility] and ask for a pelvic girdle to help with hip pain. If they do not have it, please call me and I will find out if a medical supply store carries it.  Try these exercises for pain related to sciatica in pregnancy. If this does not help, we can consider referral to physical therapy.    Schedule follow up appointment in 2 weeks with Dr. Mauricio Po in [facility] clinic.  Things you can try at home: Applying a heat or an ice pack to the painful area for 10 minutes can provide relief and is safe in pregnancy. Wearing flat, soft shoes may help prevent jarring of the spine when you walk, although some women find that wearing shoes with a bit of heel helps their back pain. Watch your posture and try to keep your back slightly arched. When sitting down, use a small bolster cushion or a rolled up towel behind your back to support the spine. Avoid heavy lifting. If you do have to lift anything, always bend from your knees and keep your back straight. Listen to your body and stop doing whatever is causing you pain. Keep mobile and avoid sitting still for long periods. Use pillows and cushions to support your bump in bed.

## 2012-04-25 ENCOUNTER — Emergency Department (HOSPITAL_COMMUNITY)
Admission: EM | Admit: 2012-04-25 | Discharge: 2012-04-25 | Disposition: A | Discharge status: Home / Self Care | Payer: Medicaid Other | Attending: Emergency Medicine | Admitting: Emergency Medicine

## 2012-04-25 ENCOUNTER — Encounter (HOSPITAL_COMMUNITY): Payer: Self-pay | Admitting: Family Medicine

## 2012-04-25 ENCOUNTER — Emergency Department (HOSPITAL_COMMUNITY): Payer: Medicaid Other

## 2012-04-25 DIAGNOSIS — R079 Chest pain, unspecified: Secondary | ICD-10-CM | POA: Insufficient documentation

## 2012-04-25 DIAGNOSIS — O9989 Other specified diseases and conditions complicating pregnancy, childbirth and the puerperium: Secondary | ICD-10-CM | POA: Insufficient documentation

## 2012-04-25 DIAGNOSIS — Z87891 Personal history of nicotine dependence: Secondary | ICD-10-CM | POA: Insufficient documentation

## 2012-04-25 DIAGNOSIS — O10919 Unspecified pre-existing hypertension complicating pregnancy, unspecified trimester: Secondary | ICD-10-CM | POA: Insufficient documentation

## 2012-04-25 DIAGNOSIS — R0789 Other chest pain: Secondary | ICD-10-CM

## 2012-04-25 DIAGNOSIS — Z8659 Personal history of other mental and behavioral disorders: Secondary | ICD-10-CM | POA: Insufficient documentation

## 2012-04-25 DIAGNOSIS — R05 Cough: Secondary | ICD-10-CM | POA: Insufficient documentation

## 2012-04-25 DIAGNOSIS — R059 Cough, unspecified: Secondary | ICD-10-CM | POA: Insufficient documentation

## 2012-04-25 LAB — POCT I-STAT TROPONIN I: Troponin i, poc: 0 ng/mL (ref 0.00–0.08)

## 2012-04-25 LAB — POCT I-STAT, CHEM 8
BUN: <3 mg/dL — ABNORMAL LOW (ref 6–23)
Calcium, Ion: 1.11 mmol/L — ABNORMAL LOW (ref 1.12–1.23)
Chloride: 108 mEq/L (ref 96–112)
Creatinine, Ser: 0.5 mg/dL (ref 0.50–1.10)
Glucose, Bld: 90 mg/dL (ref 70–99)
HCT: 31 % — ABNORMAL LOW (ref 36.0–46.0)
Hemoglobin: 10.5 g/dL — ABNORMAL LOW (ref 12.0–15.0)
Potassium: 3.7 mEq/L (ref 3.5–5.1)
Sodium: 135 mEq/L (ref 135–145)
TCO2: 17 mmol/L (ref 0–100)

## 2012-04-25 MED ORDER — ACETAMINOPHEN 325 MG PO TABS: 325.0000 mg | ORAL_TABLET | Freq: Once | ORAL | Status: DC

## 2012-04-25 MED ORDER — SODIUM CHLORIDE 0.9 % IV BOLUS (SEPSIS): 500.0000 mL | Freq: Once | INTRAVENOUS | Status: DC

## 2012-04-25 MED ORDER — ACETAMINOPHEN 325 MG PO TABS
650.0000 mg | ORAL_TABLET | Freq: Once | ORAL | Status: AC
  Administered 2012-04-25: 650 mg via ORAL
  Filled 2012-04-25: qty 2

## 2012-04-25 MED ORDER — ALUM & MAG HYDROXIDE-SIMETH 200-200-20 MG/5ML PO SUSP
30.0000 mL | Freq: Once | ORAL | Status: DC
  Filled 2012-04-25: qty 30

## 2012-04-25 NOTE — ED Notes (Signed)
Patient discharged using the teach back method patient verbalizes an understanding

## 2012-04-25 NOTE — Discharge Instructions (Signed)
Chest Wall Pain Chest wall pain is pain in or around the bones and muscles of your chest. It may take up to 6 weeks to get better. It may take longer if you must stay physically active in your work and activities.  CAUSES  Chest wall pain may happen on its own. However, it may be caused by:  A viral illness like the flu.  Injury.  Coughing.  Exercise.  Arthritis.  Fibromyalgia.  Shingles. HOME CARE INSTRUCTIONS   Avoid overtiring physical activity. Try not to strain or perform activities that cause pain. This includes any activities using your chest or your abdominal and side muscles, especially if heavy weights are used.  Put ice on the sore area.  Put ice in a plastic bag.  Place a towel between your skin and the bag.  Leave the ice on for 15 to 20 minutes per hour while awake for the first 2 days.  Only take over-the-counter or prescription medicines for pain, discomfort, or fever as directed by your caregiver. SEEK IMMEDIATE MEDICAL CARE IF:   Your pain increases, or you are very uncomfortable.  You have a fever.  Your chest pain becomes worse.  You have new, unexplained symptoms.  You have nausea or vomiting.  You feel sweaty or lightheaded.  You have a cough with phlegm (sputum), or you cough up blood. MAKE SURE YOU:   Understand these instructions.  Will watch your condition.  Will get help right away if you are not doing well or get worse. Document Released: 12/30/2004 Document Revised: 03/24/2011 Document Reviewed: 08/26/2010 [employer] Patient Information 2013 [employer].  

## 2012-04-25 NOTE — ED Notes (Signed)
Patient transported to X-ray 

## 2012-04-25 NOTE — ED Provider Notes (Signed)
History     CSN: [id number]  Arrival date & time 04/25/12  0845   First MD Initiated Contact with Patient 04/25/12 0900      Chief Complaint  Patient presents with  . Chest Pain  . Cough    (Consider location/radiation/quality/duration/timing/severity/associated sxs/prior treatment) HPI Comments: Patient reports that she is currently [redacted] weeks pregnant with her fourth pregnancy, has had a mild dry cough which she attributes to allergies and pollen counts. She reports yesterday she had a stressful day as it was one of her other kids birthdays and she was out doing a lot of activities. She reports she's had an occasional dry cough not too severe. She reports last night late while she was putting one of her children to bed she is laying on the bed with the child and began to feel very dizzy and thinks that she may have passed out briefly. She reports that she was able to hear other people try to talk to her and arouse her but she felt like she couldn't move and this lasted for a few minutes. She denies any seizure activity. She reports following this she developed some chest pressure or discomfort in her anterior central chest over her breast bone with slight radiation to the left side. She reports it is worse with palpation over her breast bone and also if she takes a very deep breath. She reports the pain did subside but then woke her again last night with discomfort and continues on this morning. She denies feeling dizzy or lightheaded at this time. She denies any recent sick contacts, fever or chills. She does get heartburn during her pregnancy but has not taken any medications and states that the pain she is having now feels different from her previous heartburn. She denies any unusual lower extremity swelling or calf tenderness. She denies any long distance travel. She reports no family history of congenital heart defects, people thinking or passing out at a young age or seizures. Pt is a former  smoker, quit 2 years ago.   Patient is a 27 y.o. female presenting with chest pain and cough. The history is provided by the patient.  Chest Pain Associated symptoms: cough   Associated symptoms: no back pain, no fever, no nausea, no palpitations, no shortness of breath and not vomiting   Cough Associated symptoms: chest pain   Associated symptoms: no chills, no fever, no rhinorrhea and no shortness of breath     Past Medical History  Diagnosis Date  . Mental disorder 2010    bipolar  . Scabies exposure 2012    treated july 2012 & reexposed  . Hypertension   . Headache     Past Surgical History  Procedure Laterality Date  . Mouth surgery      as a child  . Wisdom tooth extraction      Family History  Problem Relation Age of Onset  . Anesthesia problems Neg Hx   . Hypertension Mother     History  Substance Use Topics  . Smoking status: Former Smoker    Types: Cigarettes    Quit date: 04/04/2010  . Smokeless tobacco: Never Used  . Alcohol Use: No     Comment: quit when found out was pregnant    OB History   Grav Para Term Preterm Abortions TAB SAB Ect Mult Living   4 2 2  0 1 1 0 0 0 2      Review of Systems  Constitutional: Negative for  fever, chills and appetite change.  HENT: Negative for congestion and rhinorrhea.   Respiratory: Positive for cough. Negative for shortness of breath.   Cardiovascular: Positive for chest pain. Negative for palpitations and leg swelling.  Gastrointestinal: Negative for nausea and vomiting.  Genitourinary: Negative for vaginal bleeding and vaginal discharge.  Musculoskeletal: Negative for back pain.  All other systems reviewed and are negative.    Allergies  Penicillins and Latex  Home Medications   Current Outpatient Rx  Name  Route  Sig  Dispense  Refill  . Prenatal Vit-Fe Fumarate-FA (PRENATAL VITAMINS) 28-0.8 MG TABS   Oral   Take 1 tablet by mouth daily.   30 tablet   5     Please fill with comparable  vitamin covered by pt  ...     BP 121/75  Pulse 100  Temp(Src) 98.2 F (36.8 C)  Resp 16  SpO2 100%  LMP 09/28/2011  Physical Exam  Nursing note and vitals reviewed. Constitutional: She appears well-developed and well-nourished. No distress.  HENT:  Head: Normocephalic and atraumatic.  Eyes: EOM are normal. No scleral icterus.  Neck: Normal range of motion. Neck supple. No JVD present.  Cardiovascular: Regular rhythm.  Tachycardia present.   No murmur heard. Pulmonary/Chest: Effort normal. No respiratory distress. She has no wheezes. She has no rales. She exhibits tenderness.  Abdominal: Soft. She exhibits no distension. There is no tenderness. There is no rebound and no guarding.  Neurological: She is alert.  Skin: Skin is warm and dry. She is not diaphoretic.  No rash on anterior chest wall  Psychiatric: She has a normal mood and affect.    ED Course  Procedures (including critical care time)  Labs Reviewed  POCT I-STAT, CHEM 8 - Abnormal; Notable for the following:    BUN <3 (*)    Calcium, Ion 1.11 (*)    Hemoglobin 10.5 (*)    HCT 31.0 (*)    All other components within normal limits  POCT I-STAT TROPONIN I   Dg Chest 2 View  04/25/2012  *RADIOLOGY REPORT*  Clinical Data: Chest pain, cough  CHEST - 2 VIEW  Comparison: None.  Findings: Lungs are clear. No pleural effusion or pneumothorax.  The heart is normal in size.  Visualized osseous structures are within normal limits.  IMPRESSION: No evidence of acute cardiopulmonary disease.   Original Report Authenticated By: Charline Bills, M.D.      1. Musculoskeletal chest pain     ra sat is 99 % and I interpret to be normal  EKG at time 09:10, shows sinus tachycardia at a rate of 110, normal axis, borderline QT prolongation at a QTC of 476 ms. No ST or T-wave abnormalities. Interpretation is borderline EKG with no recent priors for comparison.   12:01 PM Pt couldn't tolerate maalox, but reports CP improved  after tylenol but now is returning.  However, she feels better, is reassured, would like to be discharged to home and she can take tylenol as needed at home.   MDM  Pt with no risks for PE other than pregnant state, no recent travel, leg swelling.  Pt's sats are 99% and pain is reproducible making musculoskeletal CP most likely etiology.  Pt apparently didn't have a seizure based on her assertion that she could hear and understand what people were saying to her.  She had a long day yesterday, on her feet most of the day and admits to high stress.  Will give maalox, tylenol, check electrolytes  and ECG, CXR.          Gavin Pound. Jhanae Jaskowiak, MD 04/27/12 1610

## 2012-04-25 NOTE — ED Notes (Signed)
Per pt she has a cough that started yesterday. sts chest hurts when she coughs. sts coughing last night and she passed out. sts arms and legs hurt

## 2012-04-29 ENCOUNTER — Other Ambulatory Visit: Payer: Self-pay | Admitting: Family Medicine

## 2012-04-29 ENCOUNTER — Ambulatory Visit (INDEPENDENT_AMBULATORY_CARE_PROVIDER_SITE_OTHER): Payer: Medicaid Other | Admitting: Family Medicine

## 2012-04-29 VITALS — BP 109/74 | Temp 98.8°F | Wt 175.0 lb

## 2012-04-29 DIAGNOSIS — Z3480 Encounter for supervision of other normal pregnancy, unspecified trimester: Secondary | ICD-10-CM

## 2012-04-29 DIAGNOSIS — Z3482 Encounter for supervision of other normal pregnancy, second trimester: Secondary | ICD-10-CM

## 2012-04-29 DIAGNOSIS — M5431 Sciatica, right side: Secondary | ICD-10-CM

## 2012-04-29 DIAGNOSIS — M5430 Sciatica, unspecified side: Secondary | ICD-10-CM

## 2012-04-29 DIAGNOSIS — Z3490 Encounter for supervision of normal pregnancy, unspecified, unspecified trimester: Secondary | ICD-10-CM

## 2012-04-29 DIAGNOSIS — F439 Reaction to severe stress, unspecified: Secondary | ICD-10-CM | POA: Insufficient documentation

## 2012-04-29 DIAGNOSIS — Z639 Problem related to primary support group, unspecified: Secondary | ICD-10-CM

## 2012-04-29 LAB — CBC
HCT: 32.3 % — ABNORMAL LOW (ref 36.0–46.0)
Hemoglobin: 10.9 g/dL — ABNORMAL LOW (ref 12.0–15.0)
MCH: 27.8 pg (ref 26.0–34.0)
MCHC: 33.7 g/dL (ref 30.0–36.0)
MCV: 82.4 fL (ref 78.0–100.0)
Platelets: 202 10*3/uL (ref 150–400)
RBC: 3.92 MIL/uL (ref 3.87–5.11)
RDW: 15.1 % (ref 11.5–15.5)
WBC: 5 10*3/uL (ref 4.0–10.5)

## 2012-04-29 LAB — RPR

## 2012-04-29 LAB — HIV ANTIBODY (ROUTINE TESTING W REFLEX): HIV: NONREACTIVE

## 2012-04-29 LAB — GLUCOSE, CAPILLARY
Comment 1: 1
Glucose-Capillary: 130 mg/dL — ABNORMAL HIGH (ref 70–99)

## 2012-04-29 MED ORDER — TETANUS-DIPHTH-ACELL PERTUSSIS 5-2.5-18.5 LF-MCG/0.5 IM SUSP: 0.5000 mL | Freq: Once | INTRAMUSCULAR | Status: DC

## 2012-04-29 NOTE — Assessment & Plan Note (Signed)
Symptom improved. Continue home exercise as instructed

## 2012-04-29 NOTE — Assessment & Plan Note (Signed)
She does not seem to be depressed. Not suicidal Stress reduction counseling done. I recommended walking exercise to relieve stress.

## 2012-04-29 NOTE — Patient Instructions (Addendum)
Fetal Movement Counts Patient Name: __________________________________________________ Patient Due Date: ____________________ Kick counts is highly recommended in high risk pregnancies, but it is a good idea for every pregnant woman to do. Start counting fetal movements at 28 weeks of the pregnancy. Fetal movements increase after eating a full meal or eating or drinking something sweet (the blood sugar is higher). It is also important to drink plenty of fluids (well hydrated) before doing the count. Lie on your left side because it helps with the circulation or you can sit in a comfortable chair with your arms over your belly (abdomen) with no distractions around you. DOING THE COUNT  Try to do the count the same time of day each time you do it.  Mark the day and time, then see how long it takes for you to feel 10 movements (kicks, flutters, swishes, rolls). You should have at least 10 movements within 2 hours. You will most likely feel 10 movements in much less than 2 hours. If you do not, wait an hour and count again. After a couple of days you will see a pattern.  What you are looking for is a change in the pattern or not enough counts in 2 hours. Is it taking longer in time to reach 10 movements? SEEK MEDICAL CARE IF:  You feel less than 10 counts in 2 hours. Tried twice.  No movement in one hour.  The pattern is changing or taking longer each day to reach 10 counts in 2 hours.  You feel the baby is not moving as it usually does. Date: ____________ Movements: ____________ Start time: ____________ Finish time: ____________  Date: ____________ Movements: ____________ Start time: ____________ Finish time: ____________ Date: ____________ Movements: ____________ Start time: ____________ Finish time: ____________ Date: ____________ Movements: ____________ Start time: ____________ Finish time: ____________ Date: ____________ Movements: ____________ Start time: ____________ Finish time:  ____________ Date: ____________ Movements: ____________ Start time: ____________ Finish time: ____________ Date: ____________ Movements: ____________ Start time: ____________ Finish time: ____________ Date: ____________ Movements: ____________ Start time: ____________ Finish time: ____________  Date: ____________ Movements: ____________ Start time: ____________ Finish time: ____________ Date: ____________ Movements: ____________ Start time: ____________ Finish time: ____________ Date: ____________ Movements: ____________ Start time: ____________ Finish time: ____________ Date: ____________ Movements: ____________ Start time: ____________ Finish time: ____________ Date: ____________ Movements: ____________ Start time: ____________ Finish time: ____________ Date: ____________ Movements: ____________ Start time: ____________ Finish time: ____________ Date: ____________ Movements: ____________ Start time: ____________ Finish time: ____________  Date: ____________ Movements: ____________ Start time: ____________ Finish time: ____________ Date: ____________ Movements: ____________ Start time: ____________ Finish time: ____________ Date: ____________ Movements: ____________ Start time: ____________ Finish time: ____________ Date: ____________ Movements: ____________ Start time: ____________ Finish time: ____________ Date: ____________ Movements: ____________ Start time: ____________ Finish time: ____________ Date: ____________ Movements: ____________ Start time: ____________ Finish time: ____________ Date: ____________ Movements: ____________ Start time: ____________ Finish time: ____________  Date: ____________ Movements: ____________ Start time: ____________ Finish time: ____________ Date: ____________ Movements: ____________ Start time: ____________ Finish time: ____________ Date: ____________ Movements: ____________ Start time: ____________ Finish time: ____________ Date: ____________ Movements:  ____________ Start time: ____________ Finish time: ____________ Date: ____________ Movements: ____________ Start time: ____________ Finish time: ____________ Date: ____________ Movements: ____________ Start time: ____________ Finish time: ____________ Date: ____________ Movements: ____________ Start time: ____________ Finish time: ____________  Date: ____________ Movements: ____________ Start time: ____________ Finish time: ____________ Date: ____________ Movements: ____________ Start time: ____________ Finish time: ____________ Date: ____________ Movements: ____________ Start time: ____________ Finish time: ____________ Date: ____________ Movements:   ____________ Start time: ____________ Finish time: ____________ Date: ____________ Movements: ____________ Start time: ____________ Finish time: ____________ Date: ____________ Movements: ____________ Start time: ____________ Finish time: ____________ Date: ____________ Movements: ____________ Start time: ____________ Finish time: ____________  Date: ____________ Movements: ____________ Start time: ____________ Finish time: ____________ Date: ____________ Movements: ____________ Start time: ____________ Finish time: ____________ Date: ____________ Movements: ____________ Start time: ____________ Finish time: ____________ Date: ____________ Movements: ____________ Start time: ____________ Finish time: ____________ Date: ____________ Movements: ____________ Start time: ____________ Finish time: ____________ Date: ____________ Movements: ____________ Start time: ____________ Finish time: ____________ Date: ____________ Movements: ____________ Start time: ____________ Finish time: ____________  Date: ____________ Movements: ____________ Start time: ____________ Finish time: ____________ Date: ____________ Movements: ____________ Start time: ____________ Finish time: ____________ Date: ____________ Movements: ____________ Start time: ____________ Finish  time: ____________ Date: ____________ Movements: ____________ Start time: ____________ Finish time: ____________ Date: ____________ Movements: ____________ Start time: ____________ Finish time: ____________ Date: ____________ Movements: ____________ Start time: ____________ Finish time: ____________ Date: ____________ Movements: ____________ Start time: ____________ Finish time: ____________  Date: ____________ Movements: ____________ Start time: ____________ Finish time: ____________ Date: ____________ Movements: ____________ Start time: ____________ Finish time: ____________ Date: ____________ Movements: ____________ Start time: ____________ Finish time: ____________ Date: ____________ Movements: ____________ Start time: ____________ Finish time: ____________ Date: ____________ Movements: ____________ Start time: ____________ Finish time: ____________ Date: ____________ Movements: ____________ Start time: ____________ Finish time: ____________ Document Released: 01/29/2006 Document Revised: 03/24/2011 Document Reviewed: 08/01/2008 [employer] Patient Information 2013 [employer].  

## 2012-04-29 NOTE — Addendum Note (Signed)
Addended by: Janit Pagan T on: 04/29/2012 02:31 PM   Modules accepted: Level of Service

## 2012-04-29 NOTE — Assessment & Plan Note (Addendum)
Baby doing well. Patient encouraged to restart pNV,may try [place] vitamin Tdap was given today. Patient prefer tubal ligation after pregnancy,she will discuss this with PMD at next visit. CBC,RPR,HIV checked today. Preterm labout warning sign reviewed. [facility] home risk form completed. F/U in 2 wks with Dr Tye Savoy.

## 2012-04-29 NOTE — Progress Notes (Signed)
Rachel Lloyd seen at [facility] clinic today,she is at [redacted]w[redacted]d [place],c/o stress at home,she has another child at home she care for,the father of her babies is not always there. She denies any abdominal pain or cramping,no vaginal discharge.She was recently accessed at the ER for chest pain but denies any chest pain today. She has not been using prenatal vitamin,for over 3 months,she used it one time and it makes feel bad.She has been eating healthy diet. Patient also mentioned her right hip pain has improved from the last time she saw Dr Hewitt Shorts has been doing well with her home exercise instruction. Patient mentioned she will like to get her tube tied after this pregnancy.

## 2012-06-08 ENCOUNTER — Ambulatory Visit (INDEPENDENT_AMBULATORY_CARE_PROVIDER_SITE_OTHER): Payer: Medicaid Other | Admitting: Family Medicine

## 2012-06-08 VITALS — BP 102/63 | Temp 98.2°F | Wt 174.0 lb

## 2012-06-08 DIAGNOSIS — Z3483 Encounter for supervision of other normal pregnancy, third trimester: Secondary | ICD-10-CM

## 2012-06-08 DIAGNOSIS — Z3480 Encounter for supervision of other normal pregnancy, unspecified trimester: Secondary | ICD-10-CM

## 2012-06-08 MED ORDER — FLUCONAZOLE 150 MG PO TABS: 150.0000 mg | ORAL_TABLET | Freq: Once | ORAL | Status: DC

## 2012-06-08 MED ORDER — FLUTICASONE PROPIONATE 50 MCG/ACT NA SUSP: 2.0000 | Freq: Every day | NASAL | Status: DC

## 2012-06-08 NOTE — Patient Instructions (Addendum)
Schedule follow up appointment with either Dr. Aviva Signs or Dr. Claiborne Billings in 2 weeks. Take Diflucan once now and then again in one week if symptoms do not improve. For allergies, use Flonase as directed.  Pregnancy - Third Trimester The third trimester begins at the 28th week of pregnancy and ends at birth. It is important to follow your doctor's instructions. HOME CARE   Go to your doctor's visits.  Do not smoke.  Do not drink alcohol or use drugs.  Only take medicine as told by your doctor.  Take prenatal vitamins as told. The vitamin should contain 1 milligram of folic acid.  Exercise.  Eat healthy foods. Eat regular, well-balanced meals.  You can have sex (intercourse) if there are no other problems with the pregnancy.  Do not use hot tubs, steam rooms, or saunas.  Wear a seat belt while driving.  Avoid raw meat, uncooked cheese, and litter boxes and soil used by cats.  Rest with your legs raised (elevated).  Make a list of emergency phone numbers. Keep this list with you.  Arrange for help when you come back home after delivering the baby.  Make a trial run to the hospital.  Take prenatal classes.  Prepare the baby's nursery.  Do not travel out of the city. If you absolutely have to, get permission from your doctor first.  Wear flat shoes. Do not wear high heels. GET HELP RIGHT AWAY IF:   You have a temperature by mouth above 102 F (38.9 C), not controlled by medicine.  You have not felt the baby move for more than 1 hour. If you think the baby is not moving as much as normal, eat something with sugar in it or lie down on your left side for an hour. The baby should move at least 4 to 5 times per hour.  Fluid is coming from the vagina.  Blood is coming from the vagina. Light spotting is common, especially after sex (intercourse).  You have belly (abdominal) pain.  You have a bad smelling fluid (discharge) coming from the vagina. The fluid changes from clear to  white.  You still feel sick to your stomach (nauseous).  You throw up (vomit) for more than 24 hours.  You have the chills.  You have shortness of breath.  You have a burning feeling when you pee (urinate).  You lose or gain more than 2 pounds (0.9 kilograms) of weight over a week, or as told by your doctor.  Your face, hands, feet, or legs get puffy (swell).  You have a bad headache that will not go away.  You start to have problems seeing (blurry or double vision).  You fall, are in a car accident, or have any kind of trauma.  There is mental or physical violence at home.  You have any concerns or worries during your pregnancy. MAKE SURE YOU:   Understand these instructions.  Will watch your condition.  Will get help right away if you are not doing well or get worse.  Document Released: 03/26/2009 Document Revised: 03/24/2011 Document Reviewed: 03/26/2009 [facility] Patient Information 2014 [place], [place].

## 2012-06-08 NOTE — Assessment & Plan Note (Signed)
See vitals and notes assessment and plan.

## 2012-06-08 NOTE — Progress Notes (Signed)
Rachel Lloyd is a 27 year old [id number] here for OB visit at [redacted]w[redacted]d.  Patient complains of vaginal itching, white thick discharge seen in the shower, but not on underwear or TP.  She says skin around vagina is very itchy.  Denies dysuria or burning with urination.  Denies fever, nausea/vomiting, or pelvic pain.  She also complains of hip pain but it comes and goes and has improved since performing hip exercises.  Patient also complains of nasal congestion.  She has a hx of allergic rhinitis and wants to know what she can take that is safe during pregnancy.  O: Vitals reviewed.  See vitals and notes. Pelvic: external vaginal area is red, beefy, from labia majora to groin bilaterally; thick white discharge interior vagina; no blood; no open sores, ulcers, or abscess  A/P: For likely yeast infection, will treat with Diflucan (Category C). For seasonal allergies, will treat with Flonase (Category C). Patient having a girl.  Plans to bottle feed.  BF was too painful with previous child. She is interested in Nexplanon after pregnancy. All [facility] and pediatric care will be at [facility] Discussed fetal kick counts and preterm labor precautions. Patient to follow up with either Dr. Aviva Lloyd or Dr. Claiborne Lloyd in 2 weeks in case I will not be here for delivery.

## 2012-06-14 ENCOUNTER — Encounter (HOSPITAL_COMMUNITY): Payer: Self-pay | Admitting: *Deleted

## 2012-06-14 ENCOUNTER — Inpatient Hospital Stay (HOSPITAL_COMMUNITY)
Admission: AD | Admit: 2012-06-14 | Discharge: 2012-06-14 | Disposition: A | Discharge status: Home / Self Care | Payer: Medicaid Other | Source: Ambulatory Visit | Attending: Obstetrics & Gynecology | Admitting: Obstetrics & Gynecology

## 2012-06-14 DIAGNOSIS — O2650 Maternal hypotension syndrome, unspecified trimester: Secondary | ICD-10-CM | POA: Insufficient documentation

## 2012-06-14 DIAGNOSIS — R55 Syncope and collapse: Secondary | ICD-10-CM

## 2012-06-14 DIAGNOSIS — E860 Dehydration: Secondary | ICD-10-CM

## 2012-06-14 DIAGNOSIS — R42 Dizziness and giddiness: Secondary | ICD-10-CM | POA: Insufficient documentation

## 2012-06-14 LAB — URINE MICROSCOPIC-ADD ON

## 2012-06-14 LAB — URINALYSIS, ROUTINE W REFLEX MICROSCOPIC
Bilirubin Urine: NEGATIVE
Glucose, UA: NEGATIVE mg/dL
Ketones, ur: 15 mg/dL — AB
Nitrite: NEGATIVE
Protein, ur: 100 mg/dL — AB
Specific Gravity, Urine: >1.03 — ABNORMAL HIGH (ref 1.005–1.030)
Urobilinogen, UA: 1 mg/dL (ref 0.0–1.0)
pH: 6 (ref 5.0–8.0)

## 2012-06-14 NOTE — MAU Note (Signed)
Pt states was in grocery store and began feeling hot and light headed. Kneeled down for a minute, then told friend she needed to get to the car. Was walking towards the front door and passed out, hitting the back of her head on the side of the conveyor belt at the check out area. Denies bleeding.

## 2012-06-14 NOTE — MAU Provider Note (Signed)
History     CSN: [id number]  Arrival date and time: 06/14/12 1406   None     No chief complaint on file.  HPI 27 y.o. [id number] at [redacted]w[redacted]d felt dizzy/lightheaded at grocery store today at about 1:45 PM today. Was walking out door and started to fall backward and hit head on sliding door. Per friend who was with her, she was only "out" for about 20 seconds. Pt states she continued to feel lightheaded on waking.  She walked to car and drank water and her friend brought her to the hospital. She is feeling better now. This has never had this happen before. No chest pain or palpitations prior to passing out but felt very hot all of a sudden. Vomiting earlier this morning and last night. No diarrhea or constipation. No fever or chills. Pt reports she had not eaten anything or had much to drink prior to episode.  Baby moving well. No ctx, bleeding or LOF. Gets prenatal care at [facility] with Dr. Tye Savoy. Last visit last Thursday. No complications of pregnancy. No problems with previous pregnancies, all vaginal deliveries x 2. Had HTN with last pregnancy.   OB History   Grav Para Term Preterm Abortions TAB SAB Ect Mult Living   4 2 2  0 1 1 0 0 0 2      Past Medical History  Diagnosis Date  . Mental disorder 2010    bipolar  . Scabies exposure 2012    treated july 2012 & reexposed  . Headache(784.0)   . Hypertension     During second pregnancy    Past Surgical History  Procedure Laterality Date  . Mouth surgery      as a child  . Wisdom tooth extraction      Family History  Problem Relation Age of Onset  . Anesthesia problems Neg Hx   . Hypertension Mother     History  Substance Use Topics  . Smoking status: Former Smoker    Types: Cigarettes    Quit date: 04/04/2010  . Smokeless tobacco: Never Used  . Alcohol Use: No     Comment: quit when found out was pregnant    Allergies:  Allergies  Allergen Reactions  . Penicillins Anaphylaxis  . Latex Itching  and Rash    Prescriptions prior to admission  Medication Sig Dispense Refill  . fluticasone (FLONASE) 50 MCG/ACT nasal spray Place 2 sprays into the nose daily.  16 g  0  . Prenatal Vit-Fe Fumarate-FA (PRENATAL VITAMINS) 28-0.8 MG TABS Take 1 tablet by mouth daily.  30 tablet  5    ROS  See HPI  Physical Exam   Blood pressure 90/41, pulse 106, temperature 98.4 F (36.9 C), temperature source Oral, resp. rate 16, height 5\' 2"  (1.575 m), weight 79.039 kg (174 lb 4 oz), last menstrual period 09/28/2011, not currently breastfeeding.  Physical Exam  Constitutional: She appears well-developed and well-nourished. No distress.  HENT:  Head: Normocephalic and atraumatic.  No masses, lacerations or knots on head.  Eyes: Conjunctivae and EOM are normal.  Neck: Neck supple.  Cardiovascular: Normal rate, regular rhythm and normal heart sounds.   Respiratory: Effort normal. No respiratory distress.  GI: Soft. Bowel sounds are normal. There is no tenderness. There is no rebound and no guarding.  Musculoskeletal: Normal range of motion. She exhibits no edema and no tenderness.  Neurological: She is alert.  Skin: Skin is warm.  Psychiatric: She has a normal mood  and affect.   Results for orders placed during the hospital encounter of 06/14/12 (from the past 24 hour(s))  URINALYSIS, ROUTINE W REFLEX MICROSCOPIC     Status: Abnormal   Collection Time    06/14/12  2:29 PM      Result Value Range   Color, Urine YELLOW  YELLOW   APPearance CLOUDY (*) CLEAR   Specific Gravity, Urine >1.030 (*) 1.005 - 1.030   pH 6.0  5.0 - 8.0   Glucose, UA NEGATIVE  NEGATIVE mg/dL   Hgb urine dipstick TRACE (*) NEGATIVE   Bilirubin Urine NEGATIVE  NEGATIVE   Ketones, ur 15 (*) NEGATIVE mg/dL   Protein, ur 161 (*) NEGATIVE mg/dL   Urobilinogen, UA 1.0  0.0 - 1.0 mg/dL   Nitrite NEGATIVE  NEGATIVE   Leukocytes, UA MODERATE (*) NEGATIVE  URINE MICROSCOPIC-ADD ON     Status: Abnormal   Collection Time     06/14/12  2:29 PM      Result Value Range   Squamous Epithelial / LPF MANY (*) RARE   WBC, UA 11-20  <3 WBC/hpf   Bacteria, UA MANY (*) RARE   FHTs:  135, moderate variability, accels present, no decels  MAU Course  Procedures   Assessment and Plan  27 y.o. [id number] at [redacted]w[redacted]d with near-syncopal/syncopal episode - Likely related to not eating or drinking well in AM - No significant injuries - No evidence for arrhythmia or stroke - Pt advised to drink more water and eat more frequent smaller snacks. - She declined EKG and had to leave to pick up her children/child from school. - Deemed stable for discharge home. F/U with PCP  Napoleon Form 06/14/2012, 3:33 PM

## 2012-06-14 NOTE — MAU Note (Signed)
Also has had vomiting since 3 am.

## 2012-06-14 NOTE — MAU Note (Signed)
Pt also states she hasn't eaten anything today.

## 2012-06-15 NOTE — MAU Provider Note (Signed)
If syncopal episode occurs again or ahs any other cardiac symptoms pt needs cardiology referral.  Attestation of Attending Supervision of Advanced Practitioner (CNM/NP): Evaluation and management procedures were performed by the Advanced Practitioner under my supervision and collaboration. I have reviewed the Advanced Practitioner's note and chart, and I agree with the management and plan.  Leanne Sisler H. 5:56 AM

## 2012-06-16 LAB — URINE CULTURE: Colony Count: >=100000

## 2012-06-22 ENCOUNTER — Telehealth: Payer: Self-pay | Admitting: *Deleted

## 2012-06-22 ENCOUNTER — Encounter: Payer: Medicaid Other | Admitting: Family Medicine

## 2012-06-22 NOTE — Telephone Encounter (Signed)
Pt reports that she has been up all night with lower back pain, lower abdomen cramping and contractions. States that it is very uncomfortable and is unable to get to scheduled appointment this morning. Recommended that she go to [facility] for further eval and we can reschedule her appointment. Pt verbalized understanding. Wyatt Haste, RN-BSN

## 2012-07-02 ENCOUNTER — Telehealth: Payer: Self-pay | Admitting: *Deleted

## 2012-07-02 ENCOUNTER — Ambulatory Visit: Payer: Medicaid Other
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
## 2012-07-05 ENCOUNTER — Ambulatory Visit (INDEPENDENT_AMBULATORY_CARE_PROVIDER_SITE_OTHER): Payer: Medicaid Other | Admitting: Family Medicine

## 2012-07-05 VITALS — BP 105/66 | Temp 98.3°F | Wt 171.2 lb

## 2012-07-05 DIAGNOSIS — Z3480 Encounter for supervision of other normal pregnancy, unspecified trimester: Secondary | ICD-10-CM

## 2012-07-05 DIAGNOSIS — Z3483 Encounter for supervision of other normal pregnancy, third trimester: Secondary | ICD-10-CM

## 2012-07-05 NOTE — Patient Instructions (Signed)
Come back to see us in 1 week

## 2012-07-05 NOTE — Progress Notes (Signed)
Presents today as same day prenatal visit.  Complaining of pelvic pain/cramping intermittently.  No bleeding or fluid discharge.  Good fetal movement.  O: See flowsheet.  A/p: 37 weeks, not in labor, no red flags beyond not having appointment since 34 weeks.  Advise hydration and f/u in 7 days.

## 2012-07-12 ENCOUNTER — Telehealth: Payer: Self-pay | Admitting: Family Medicine

## 2012-07-14 ENCOUNTER — Other Ambulatory Visit (HOSPITAL_COMMUNITY)
Admission: RE | Admit: 2012-07-14 | Discharge: 2012-07-14 | Disposition: A | Discharge status: Home / Self Care | Payer: Medicaid Other | Source: Ambulatory Visit | Attending: Emergency Medicine | Admitting: Emergency Medicine

## 2012-07-14 ENCOUNTER — Ambulatory Visit (INDEPENDENT_AMBULATORY_CARE_PROVIDER_SITE_OTHER): Payer: Medicaid Other | Admitting: Emergency Medicine

## 2012-07-14 VITALS — BP 112/60 | Wt 173.0 lb

## 2012-07-14 DIAGNOSIS — Z113 Encounter for screening for infections with a predominantly sexual mode of transmission: Secondary | ICD-10-CM | POA: Insufficient documentation

## 2012-07-14 DIAGNOSIS — N898 Other specified noninflammatory disorders of vagina: Secondary | ICD-10-CM

## 2012-07-14 DIAGNOSIS — Z3480 Encounter for supervision of other normal pregnancy, unspecified trimester: Secondary | ICD-10-CM

## 2012-07-14 DIAGNOSIS — Z3483 Encounter for supervision of other normal pregnancy, third trimester: Secondary | ICD-10-CM

## 2012-07-14 LAB — POCT WET PREP (WET MOUNT)
Clue Cells Wet Prep Whiff POC: NEGATIVE
WBC, Wet Prep HPF POC: >20

## 2012-07-14 LAB — OB RESULTS CONSOLE GBS: GBS: NEGATIVE

## 2012-07-14 MED ORDER — FLUCONAZOLE 150 MG PO TABS: 150.0000 mg | ORAL_TABLET | Freq: Once | ORAL | Status: DC

## 2012-07-14 NOTE — Patient Instructions (Addendum)
If you have regular contractions that are 3-5 minutes apart for at least one hour, bleeding or fluid from your vagina, or you do not feel the baby moving enough, please go to the [facility].  I will call you if anything else shows up on the wet prep.  Follow up in 1 week.

## 2012-07-14 NOTE — Assessment & Plan Note (Signed)
History and exam consistent with yeast. Will get wet prep and treat presumptively with diflucan.

## 2012-07-14 NOTE — Progress Notes (Signed)
S: 27 yo G4P2012 at 105w2d by 17 week ultrasound.  Complains of vaginal itching without discharge - states like typical yeast infection.  Does get rare contractions few times a week.  No vaginal bleeding or discharge.  No loss of fluid.  Good fetal movement. Birth control: wanted BTL, but did not sign papers soon enough, Nexplanon is next choice  O: see flowsheet Pelvic: normal external genitalia, normal vagina, moderate amount of thick white discharge present, normal gravid cervix  A/P: 27 yo G4P2012 at [redacted]w[redacted]d by [place] - continue prenatal vitamin - GBS and cultures collected today - GBS will need sensitivities due to PCN allergy - wet prep done today; will treat presumptively with Diflucan 150mg  x1 - urine culture last month showed lactobacillus - this is normal flora and she is asymptomatic - reviewed labor precautions and kick counts - f/u in 1 week

## 2012-07-15 ENCOUNTER — Observation Stay (HOSPITAL_COMMUNITY)
Admission: AD | Admit: 2012-07-15 | Discharge: 2012-07-16 | Disposition: A | Discharge status: Home / Self Care | Payer: Medicaid Other | Source: Ambulatory Visit | Attending: Obstetrics & Gynecology | Admitting: Obstetrics & Gynecology

## 2012-07-15 ENCOUNTER — Encounter (HOSPITAL_COMMUNITY): Payer: Self-pay | Admitting: *Deleted

## 2012-07-15 DIAGNOSIS — M549 Dorsalgia, unspecified: Secondary | ICD-10-CM

## 2012-07-15 DIAGNOSIS — O99891 Other specified diseases and conditions complicating pregnancy: Secondary | ICD-10-CM

## 2012-07-15 DIAGNOSIS — IMO0002 Reserved for concepts with insufficient information to code with codable children: Secondary | ICD-10-CM

## 2012-07-15 DIAGNOSIS — R109 Unspecified abdominal pain: Secondary | ICD-10-CM | POA: Insufficient documentation

## 2012-07-15 DIAGNOSIS — O9989 Other specified diseases and conditions complicating pregnancy, childbirth and the puerperium: Secondary | ICD-10-CM

## 2012-07-15 DIAGNOSIS — M545 Low back pain, unspecified: Secondary | ICD-10-CM | POA: Insufficient documentation

## 2012-07-15 DIAGNOSIS — M25519 Pain in unspecified shoulder: Secondary | ICD-10-CM

## 2012-07-15 DIAGNOSIS — Y92410 Unspecified street and highway as the place of occurrence of the external cause: Secondary | ICD-10-CM | POA: Insufficient documentation

## 2012-07-15 DIAGNOSIS — O479 False labor, unspecified: Secondary | ICD-10-CM | POA: Insufficient documentation

## 2012-07-15 LAB — CBC
HCT: 32.4 % — ABNORMAL LOW (ref 36.0–46.0)
Hemoglobin: 10.9 g/dL — ABNORMAL LOW (ref 12.0–15.0)
MCH: 28.2 pg (ref 26.0–34.0)
MCHC: 33.6 g/dL (ref 30.0–36.0)
MCV: 83.7 fL (ref 78.0–100.0)
Platelets: 171 10*3/uL (ref 150–400)
RBC: 3.87 MIL/uL (ref 3.87–5.11)
RDW: 15.5 % (ref 11.5–15.5)
WBC: 6.5 10*3/uL (ref 4.0–10.5)

## 2012-07-15 LAB — URINALYSIS, ROUTINE W REFLEX MICROSCOPIC
Bilirubin Urine: NEGATIVE
Glucose, UA: NEGATIVE mg/dL
Ketones, ur: 40 mg/dL — AB
Nitrite: NEGATIVE
Protein, ur: NEGATIVE mg/dL
Specific Gravity, Urine: >1.03 — ABNORMAL HIGH (ref 1.005–1.030)
Urobilinogen, UA: 1 mg/dL (ref 0.0–1.0)
pH: 6 (ref 5.0–8.0)

## 2012-07-15 LAB — URINE MICROSCOPIC-ADD ON

## 2012-07-15 LAB — KLEIHAUER-BETKE STAIN
Fetal Cells %: 0 %
Quantitation Fetal Hemoglobin: 0 mL

## 2012-07-15 MED ORDER — ZOLPIDEM TARTRATE 5 MG PO TABS: 5.0000 mg | ORAL_TABLET | Freq: Every evening | ORAL | Status: DC | PRN

## 2012-07-15 MED ORDER — CALCIUM CARBONATE ANTACID 500 MG PO CHEW: 2.0000 | CHEWABLE_TABLET | ORAL | Status: DC | PRN

## 2012-07-15 MED ORDER — ACETAMINOPHEN 325 MG PO TABS: 650.0000 mg | ORAL_TABLET | ORAL | Status: DC | PRN

## 2012-07-15 MED ORDER — FAMOTIDINE 20 MG PO TABS
20.0000 mg | ORAL_TABLET | Freq: Two times a day (BID) | ORAL | Status: DC | PRN
  Filled 2012-07-15: qty 1

## 2012-07-15 MED ORDER — FLUCONAZOLE 150 MG PO TABS
150.0000 mg | ORAL_TABLET | Freq: Once | ORAL | Status: AC
  Administered 2012-07-15: 150 mg via ORAL
  Filled 2012-07-15: qty 1

## 2012-07-15 MED ORDER — PRENATAL MULTIVITAMIN CH
1.0000 | ORAL_TABLET | Freq: Every day | ORAL | Status: DC
  Filled 2012-07-15: qty 1

## 2012-07-15 MED ORDER — DOCUSATE SODIUM 100 MG PO CAPS
100.0000 mg | ORAL_CAPSULE | Freq: Every day | ORAL | Status: DC
  Filled 2012-07-15: qty 1
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
## 2012-07-16 ENCOUNTER — Observation Stay (HOSPITAL_COMMUNITY): Payer: Medicaid Other
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
## 2012-07-16 NOTE — Progress Notes (Signed)
Monitors off for [place] transport.

## 2012-07-16 NOTE — Progress Notes (Signed)
Subjective: Patient reports no problems overnight. Has not had any contractions today. No abdominal pain, no loss of fluid, no blood.   Objective: Filed Vitals:   07/16/12 1100 07/16/12 1156 07/16/12 1553 07/16/12 1600  BP:  113/62 117/69   Pulse:  79 85   Temp:  98.1 F (36.7 C) 98.5 F (36.9 C)   TempSrc:  Oral Oral   Resp: 18 18 18 18   Height:      Weight:      SpO2:       General: alert and no distress Resp: clear to auscultation bilaterally Cardio: regular rate and rhythm and S1, S2 normal GI: gravid, no tenderness to palpation, fundal height consistent with GA. Extremities: extremities normal, atraumatic, no cyanosis or edema  EFM: 130bpm, moderate variability, accels present no decels.  Assessment/Plan: 24hr observation Stable for discharge    Tawni Carnes 07/16/2012, 5:05 PM

## 2012-07-18 LAB — URINE CULTURE: Colony Count: >=100000

## 2012-07-19 LAB — CULTURE, BETA STREP (GROUP B ONLY)

## 2012-07-20 ENCOUNTER — Ambulatory Visit (INDEPENDENT_AMBULATORY_CARE_PROVIDER_SITE_OTHER): Payer: Medicaid Other | Admitting: Family Medicine

## 2012-07-20 VITALS — BP 111/72 | Temp 97.9°F | Wt 171.0 lb

## 2012-07-20 DIAGNOSIS — Z3480 Encounter for supervision of other normal pregnancy, unspecified trimester: Secondary | ICD-10-CM

## 2012-07-20 DIAGNOSIS — Z3483 Encounter for supervision of other normal pregnancy, third trimester: Secondary | ICD-10-CM

## 2012-07-20 NOTE — Progress Notes (Signed)
Patient seen for OB follow up; she reports continued fetal movement, a decrease in contractions from recent MAU evaluation (now having a contraction every 30-60 minutes).  No LOF, no vaginal bleeding.  No dysuria or polyuria.  UCx from 07/03 with multiple morphotypes, suggestive of contamination.  Reviewed negative GBS and negative cervical cx collected at last prenatal visit.  Discussed s/sx of labor; for follow up with primary physician in 1 week, to have NST/BPP x2 scheduled between weeks 40 and 41 if not delivered by next visit.  Paula Compton, MD

## 2012-07-20 NOTE — Assessment & Plan Note (Signed)
See vitals and notes

## 2012-07-20 NOTE — Patient Instructions (Addendum)
It was a pleasure to see you today.  You are 39 weeks and 1 day along.    Please schedule your next prenatal visit with Dr Gwenlyn Saran in 6 to 7 days for followup.  If you have not delivered by then, we will arrange for 2 monitoring sessions (called NST and BPP) in the week between 40 and [redacted] weeks gestation.   PLEASE SCHEDULE WITH DR Gwenlyn Saran FOR OB FOLLOW UP ON July 14 OR 15TH; IF SHE IS NOT AVAILABLE, SCHEDULE WITH RESIDENT PHYSICIAN FROM HER TEAM.

## 2012-07-22 ENCOUNTER — Inpatient Hospital Stay (HOSPITAL_COMMUNITY)
Admission: AD | Admit: 2012-07-22 | Discharge: 2012-07-22 | Disposition: A | Discharge status: Home / Self Care | Payer: Medicaid Other | Source: Ambulatory Visit | Attending: Obstetrics & Gynecology | Admitting: Obstetrics & Gynecology

## 2012-07-22 ENCOUNTER — Telehealth: Payer: Self-pay | Admitting: *Deleted

## 2012-07-22 ENCOUNTER — Encounter (HOSPITAL_COMMUNITY): Payer: Self-pay | Admitting: *Deleted

## 2012-07-22 DIAGNOSIS — O471 False labor at or after 37 completed weeks of gestation: Secondary | ICD-10-CM

## 2012-07-22 DIAGNOSIS — O99891 Other specified diseases and conditions complicating pregnancy: Secondary | ICD-10-CM | POA: Insufficient documentation

## 2012-07-22 DIAGNOSIS — O479 False labor, unspecified: Secondary | ICD-10-CM

## 2012-07-22 LAB — AMNISURE RUPTURE OF MEMBRANE (ROM) NOT AT ARMC: Amnisure ROM: NEGATIVE

## 2012-07-22 NOTE — MAU Note (Signed)
C/o vaginal leaking since 0800 this AM

## 2012-07-22 NOTE — MAU Provider Note (Signed)
  History     CSN: [id number]  Arrival date and time: 07/22/12 1054   None     Chief Complaint  Patient presents with  . Rupture of Membranes   HPI 27yo [id number] at [redacted]w[redacted]d here for possible rupture of membranes. Woke up this morning with soaked underwear, milky discharge, no bleeding. Has since changed underwear 3 times, each time soaked. No major gush of fluid, no bleeding. Irregular mild contractions, maybe 2 an hour. No bleeding. Slight headache this morning, no dizziness, no chest pain, no shortness of breath.   OB History   Grav Para Term Preterm Abortions TAB SAB Ect Mult Living   4 2 2  0 1 1 0 0 0 2      Past Medical History  Diagnosis Date  . Mental disorder 2010    bipolar  . Scabies exposure 2012    treated july 2012 & reexposed  . Headache(784.0)   . Hypertension     During second pregnancy    Past Surgical History  Procedure Laterality Date  . Mouth surgery      as a child  . Wisdom tooth extraction      Family History  Problem Relation Age of Onset  . Anesthesia problems Neg Hx   . Hypertension Mother     History  Substance Use Topics  . Smoking status: Former Smoker    Types: Cigarettes    Quit date: 04/04/2010  . Smokeless tobacco: Never Used  . Alcohol Use: No     Comment: quit when found out was pregnant    Allergies:  Allergies  Allergen Reactions  . Penicillins Anaphylaxis  . Latex Itching and Rash    No prescriptions prior to admission    ROS negative except as above  Physical Exam   Blood pressure 116/61, pulse 98, temperature 98.4 F (36.9 C), temperature source Oral, resp. rate 16, height 5' 0.5" (1.537 m), weight 78.382 kg (172 lb 12.8 oz), last menstrual period 09/28/2011.  Physical Exam General appearance: alert and no distress Head: Normocephalic, without obvious abnormality, atraumatic Lungs: clear to auscultation bilaterally Heart: regular rate and rhythm, S1, S2 normal, no murmur, click, rub or gallop Abdomen:  gravid, nontender to palpation Extremities: extremities normal, atraumatic, no cyanosis or edema Pulses: 2+ and symmetric DP  Speculum exam: no obvious pooling, white mucous  FHT: 135bpm, mod var, 15x15 accels, no decels Toco: No contractions  Negative fern  MAU Course  Procedures Results for orders placed during the hospital encounter of 07/22/12 (from the past 24 hour(s))  AMNISURE RUPTURE OF MEMBRANE (ROM)     Status: None   Collection Time    07/22/12 12:10 PM      Result Value Range   Amnisure ROM NEGATIVE      MDM   Assessment and Plan  27yo [id number] [redacted]w[redacted]d here for possible rupture of membranes  Fern and Amnisure negative No contractions on monitor Not in active labor  Stable for discharge  Tawni Carnes 07/22/2012, 12:28 PM   I saw and examined patient and agree with above resident note. I reviewed history, imaging, labs, and vitals. I personally reviewed the fetal heart tracing, and it is reactive. Napoleon Form, MD

## 2012-07-22 NOTE — MAU Note (Signed)
Patient states she has been leaking clear fluid since 0800 with irregular contractions. Not actively leaking at this time.

## 2012-07-22 NOTE — Telephone Encounter (Signed)
Pt reports "trickling sensation of clear fluid coming out for the past hour" Describes as clear fluid, not urine, no odor. Advised to go directly to [facility] for further eval of possible water breaking - pt verbalized understanding. Pt due date is July 14. Wyatt Haste, RN-BSN

## 2012-07-23 NOTE — MAU Provider Note (Signed)
Attestation of Attending Supervision of Obstetric Fellow: Evaluation and management procedures were performed by the Obstetric Fellow under my supervision and collaboration.  I have reviewed the Obstetric Fellow's note and chart, and I agree with the management and plan.  UGONNA  ANYANWU, MD, FACOG Attending Obstetrician & Gynecologist Faculty Practice, [facility]   

## 2012-07-28 ENCOUNTER — Ambulatory Visit (INDEPENDENT_AMBULATORY_CARE_PROVIDER_SITE_OTHER): Payer: Medicaid Other | Admitting: Emergency Medicine

## 2012-07-28 ENCOUNTER — Telehealth (HOSPITAL_COMMUNITY): Payer: Self-pay | Admitting: *Deleted

## 2012-07-28 VITALS — BP 113/69 | Wt 174.0 lb

## 2012-07-28 DIAGNOSIS — Z3480 Encounter for supervision of other normal pregnancy, unspecified trimester: Secondary | ICD-10-CM

## 2012-07-28 DIAGNOSIS — Z3483 Encounter for supervision of other normal pregnancy, third trimester: Secondary | ICD-10-CM

## 2012-07-28 NOTE — Telephone Encounter (Signed)
error 

## 2012-07-28 NOTE — Patient Instructions (Addendum)
If you have regular contractions that are 3-5 minutes apart for at least one hour, bleeding or fluid from your vagina, or you do not feel the baby moving enough, please go to the [facility].  Please go to the appointments today and Friday to assess the baby since you are post-dates.  We will schedule an induction for Sunday 7/20 if you do not have a baby before then.
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
## 2012-07-28 NOTE — Telephone Encounter (Signed)
Preadmission screen  

## 2012-07-29 ENCOUNTER — Ambulatory Visit (INDEPENDENT_AMBULATORY_CARE_PROVIDER_SITE_OTHER): Payer: Medicaid Other | Admitting: *Deleted

## 2012-07-29 VITALS — BP 106/62

## 2012-07-29 DIAGNOSIS — O480 Post-term pregnancy: Secondary | ICD-10-CM

## 2012-07-29 NOTE — Progress Notes (Signed)
P = 92   IOL scheduled on 08/01/12 @ 0700.  Labor sx reviewed

## 2012-08-01 ENCOUNTER — Encounter (HOSPITAL_COMMUNITY): Payer: Self-pay | Admitting: Anesthesiology

## 2012-08-01 ENCOUNTER — Encounter (HOSPITAL_COMMUNITY): Payer: Self-pay

## 2012-08-01 ENCOUNTER — Inpatient Hospital Stay (HOSPITAL_COMMUNITY): Payer: Medicaid Other | Admitting: Anesthesiology

## 2012-08-01 ENCOUNTER — Inpatient Hospital Stay (HOSPITAL_COMMUNITY)
Admission: RE | Admit: 2012-08-01 | Discharge: 2012-08-03 | DRG: 775 | Disposition: A | Discharge status: Home / Self Care | Payer: Medicaid Other | Source: Ambulatory Visit | Attending: Obstetrics & Gynecology | Admitting: Obstetrics & Gynecology

## 2012-08-01 DIAGNOSIS — O99344 Other mental disorders complicating childbirth: Secondary | ICD-10-CM

## 2012-08-01 DIAGNOSIS — O480 Post-term pregnancy: Principal | ICD-10-CM | POA: Diagnosis present

## 2012-08-01 DIAGNOSIS — F319 Bipolar disorder, unspecified: Secondary | ICD-10-CM | POA: Diagnosis present

## 2012-08-01 LAB — CBC
HCT: 33.9 % — ABNORMAL LOW (ref 36.0–46.0)
Hemoglobin: 11.4 g/dL — ABNORMAL LOW (ref 12.0–15.0)
MCH: 28.1 pg (ref 26.0–34.0)
MCHC: 33.6 g/dL (ref 30.0–36.0)
MCV: 83.7 fL (ref 78.0–100.0)
Platelets: 167 10*3/uL (ref 150–400)
RBC: 4.05 MIL/uL (ref 3.87–5.11)
RDW: 15.4 % (ref 11.5–15.5)
WBC: 5.6 10*3/uL (ref 4.0–10.5)

## 2012-08-01 LAB — RPR: RPR Ser Ql: NONREACTIVE

## 2012-08-01 LAB — TYPE AND SCREEN
ABO/RH(D): A POS
Antibody Screen: NEGATIVE

## 2012-08-01 MED ORDER — ONDANSETRON HCL 4 MG/2ML IJ SOLN: 4.0000 mg | INTRAMUSCULAR | Status: DC | PRN

## 2012-08-01 MED ORDER — SENNOSIDES-DOCUSATE SODIUM 8.6-50 MG PO TABS
2.0000 | ORAL_TABLET | Freq: Every day | ORAL | Status: DC
  Administered 2012-08-02: 2 via ORAL

## 2012-08-01 MED ORDER — OXYTOCIN BOLUS FROM INFUSION: 500.0000 mL | INTRAVENOUS | Status: DC

## 2012-08-01 MED ORDER — ONDANSETRON HCL 4 MG/2ML IJ SOLN: 4.0000 mg | Freq: Four times a day (QID) | INTRAMUSCULAR | Status: DC | PRN

## 2012-08-01 MED ORDER — IBUPROFEN 600 MG PO TABS
600.0000 mg | ORAL_TABLET | Freq: Four times a day (QID) | ORAL | Status: DC | PRN
  Administered 2012-08-01: 600 mg via ORAL
  Filled 2012-08-01: qty 1

## 2012-08-01 MED ORDER — FLEET ENEMA 7-19 GM/118ML RE ENEM: 1.0000 | ENEMA | RECTAL | Status: DC | PRN

## 2012-08-01 MED ORDER — DIPHENHYDRAMINE HCL 25 MG PO CAPS: 25.0000 mg | ORAL_CAPSULE | Freq: Four times a day (QID) | ORAL | Status: DC | PRN

## 2012-08-01 MED ORDER — LIDOCAINE HCL (PF) 1 % IJ SOLN
30.0000 mL | INTRAMUSCULAR | Status: DC | PRN
  Filled 2012-08-01 (×2): qty 30

## 2012-08-01 MED ORDER — ACETAMINOPHEN 325 MG PO TABS
650.0000 mg | ORAL_TABLET | ORAL | Status: DC | PRN
  Administered 2012-08-01: 650 mg via ORAL
  Filled 2012-08-01: qty 2

## 2012-08-01 MED ORDER — IBUPROFEN 600 MG PO TABS
600.0000 mg | ORAL_TABLET | Freq: Four times a day (QID) | ORAL | Status: DC
  Administered 2012-08-02 – 2012-08-03 (×5): 600 mg via ORAL
  Filled 2012-08-01 (×5): qty 1

## 2012-08-01 MED ORDER — TERBUTALINE SULFATE 1 MG/ML IJ SOLN: 0.2500 mg | Freq: Once | INTRAMUSCULAR | Status: DC | PRN

## 2012-08-01 MED ORDER — LACTATED RINGERS IV SOLN: 500.0000 mL | INTRAVENOUS | Status: DC | PRN

## 2012-08-01 MED ORDER — OXYCODONE-ACETAMINOPHEN 5-325 MG PO TABS
1.0000 | ORAL_TABLET | ORAL | Status: DC | PRN
  Administered 2012-08-02 – 2012-08-03 (×3): 1 via ORAL
  Filled 2012-08-01 (×2): qty 1
  Filled 2012-08-01: qty 2

## 2012-08-01 MED ORDER — ZOLPIDEM TARTRATE 5 MG PO TABS: 5.0000 mg | ORAL_TABLET | Freq: Every evening | ORAL | Status: DC | PRN

## 2012-08-01 MED ORDER — DIPHENHYDRAMINE HCL 50 MG/ML IJ SOLN: 12.5000 mg | INTRAMUSCULAR | Status: DC | PRN

## 2012-08-01 MED ORDER — LIDOCAINE HCL (PF) 1 % IJ SOLN
INTRAMUSCULAR | Status: DC | PRN
  Administered 2012-08-01 (×2): 4 mL

## 2012-08-01 MED ORDER — WITCH HAZEL-GLYCERIN EX PADS: 1.0000 "application " | MEDICATED_PAD | CUTANEOUS | Status: DC | PRN

## 2012-08-01 MED ORDER — OXYTOCIN 40 UNITS IN LACTATED RINGERS INFUSION - SIMPLE MED: 62.5000 mL/h | INTRAVENOUS | Status: DC

## 2012-08-01 MED ORDER — ONDANSETRON HCL 4 MG PO TABS: 4.0000 mg | ORAL_TABLET | ORAL | Status: DC | PRN

## 2012-08-01 MED ORDER — EPHEDRINE 5 MG/ML INJ
10.0000 mg | INTRAVENOUS | Status: DC | PRN
  Filled 2012-08-01: qty 2

## 2012-08-01 MED ORDER — SIMETHICONE 80 MG PO CHEW: 80.0000 mg | CHEWABLE_TABLET | ORAL | Status: DC | PRN

## 2012-08-01 MED ORDER — OXYTOCIN 40 UNITS IN LACTATED RINGERS INFUSION - SIMPLE MED
1.0000 m[IU]/min | INTRAVENOUS | Status: DC
  Administered 2012-08-01: 666 m[IU]/min via INTRAVENOUS
  Administered 2012-08-01: 2 m[IU]/min via INTRAVENOUS
  Filled 2012-08-01: qty 1000

## 2012-08-01 MED ORDER — OXYCODONE-ACETAMINOPHEN 5-325 MG PO TABS: 1.0000 | ORAL_TABLET | ORAL | Status: DC | PRN

## 2012-08-01 MED ORDER — LANOLIN HYDROUS EX OINT: TOPICAL_OINTMENT | CUTANEOUS | Status: DC | PRN

## 2012-08-01 MED ORDER — PHENYLEPHRINE 40 MCG/ML (10ML) SYRINGE FOR IV PUSH (FOR BLOOD PRESSURE SUPPORT)
80.0000 ug | PREFILLED_SYRINGE | INTRAVENOUS | Status: DC | PRN
  Filled 2012-08-01: qty 5
  Filled 2012-08-01: qty 2

## 2012-08-01 MED ORDER — MISOPROSTOL 25 MCG QUARTER TABLET
25.0000 ug | ORAL_TABLET | ORAL | Status: DC | PRN
  Administered 2012-08-01: 25 ug via VAGINAL
  Filled 2012-08-01: qty 1
  Filled 2012-08-01: qty 0.25

## 2012-08-01 MED ORDER — PRENATAL MULTIVITAMIN CH
1.0000 | ORAL_TABLET | Freq: Every day | ORAL | Status: DC
  Administered 2012-08-02: 1 via ORAL
  Filled 2012-08-01: qty 1

## 2012-08-01 MED ORDER — LACTATED RINGERS IV SOLN
INTRAVENOUS | Status: DC
  Administered 2012-08-01 (×2): via INTRAVENOUS

## 2012-08-01 MED ORDER — BENZOCAINE-MENTHOL 20-0.5 % EX AERO
1.0000 "application " | INHALATION_SPRAY | CUTANEOUS | Status: DC | PRN
  Filled 2012-08-01: qty 56

## 2012-08-01 MED ORDER — DIBUCAINE 1 % RE OINT: 1.0000 "application " | TOPICAL_OINTMENT | RECTAL | Status: DC | PRN

## 2012-08-01 MED ORDER — LACTATED RINGERS IV SOLN: 500.0000 mL | Freq: Once | INTRAVENOUS | Status: DC

## 2012-08-01 MED ORDER — CITRIC ACID-SODIUM CITRATE 334-500 MG/5ML PO SOLN: 30.0000 mL | ORAL | Status: DC | PRN

## 2012-08-01 MED ORDER — PHENYLEPHRINE 40 MCG/ML (10ML) SYRINGE FOR IV PUSH (FOR BLOOD PRESSURE SUPPORT)
80.0000 ug | PREFILLED_SYRINGE | INTRAVENOUS | Status: DC | PRN
  Filled 2012-08-01: qty 2

## 2012-08-01 MED ORDER — FENTANYL 2.5 MCG/ML BUPIVACAINE 1/10 % EPIDURAL INFUSION (WH - ANES)
14.0000 mL/h | INTRAMUSCULAR | Status: DC | PRN
  Filled 2012-08-01: qty 125

## 2012-08-01 MED ORDER — EPHEDRINE 5 MG/ML INJ
10.0000 mg | INTRAVENOUS | Status: DC | PRN
  Filled 2012-08-01: qty 2
  Filled 2012-08-01: qty 4

## 2012-08-01 MED ORDER — HYDROXYZINE HCL 50 MG PO TABS
50.0000 mg | ORAL_TABLET | Freq: Four times a day (QID) | ORAL | Status: DC | PRN
  Filled 2012-08-01: qty 1

## 2012-08-01 MED ORDER — FENTANYL CITRATE 0.05 MG/ML IJ SOLN
100.0000 ug | INTRAMUSCULAR | Status: DC | PRN
  Administered 2012-08-01: 100 ug via INTRAVENOUS
  Filled 2012-08-01: qty 2

## 2012-08-01 MED ORDER — FENTANYL 2.5 MCG/ML BUPIVACAINE 1/10 % EPIDURAL INFUSION (WH - ANES)
INTRAMUSCULAR | Status: DC | PRN
  Administered 2012-08-01: 13 mL/h via EPIDURAL

## 2012-08-01 NOTE — H&P (Signed)
Rachel Lloyd is a 27 y.o. [phone] female at [redacted]w[redacted]d by 17.2wk u/s, presenting for IOL d/t postdates.Reports good fm. Denies uc's, vb, lof.  Initiated pnc at [facility] @ 16.4wks w/ Dr. Domenick Bookbinder, but primary has recently been switched to Dr. Elwyn Reach. No genetic screening found, anatomy u/s normal, 1hr 130, gbs neg. H/O ?24wk EAB, term SVD, and most recently a term VAVD of 8lb 2.3oz baby 1 yr ago. H/O bipolar, no meds.  Maternal Medical History:  Fetal activity: Perceived fetal activity is normal.   Last perceived fetal movement was within the past hour.    Prenatal Complications - Diabetes: none.    OB History   Grav Para Term Preterm Abortions TAB SAB Ect Mult Living   4 2 2  0 1 1 0 0 0 2     Past Medical History  Diagnosis Date  . Mental disorder 2010    bipolar  . Scabies exposure 2012    treated july 2012 & reexposed  . Hypertension     During second pregnancy   Past Surgical History  Procedure Laterality Date  . Mouth surgery      as a child  . Wisdom tooth extraction     Family History: family history includes Hypertension in her mother.  There is no history of Anesthesia problems. Social History:  reports that she quit smoking about 2 years ago. Her smoking use included Cigarettes. She smoked 0.00 packs per day. She has never used smokeless tobacco. She reports that she does not drink alcohol or use illicit drugs.  Review of Systems  Constitutional: Negative.   HENT: Negative.   Eyes: Negative.   Respiratory: Negative.   Cardiovascular: Negative.   Gastrointestinal: Negative.   Genitourinary: Negative.   Musculoskeletal: Negative.   Skin: Negative.   Neurological: Negative.   Endo/Heme/Allergies: Negative.   Psychiatric/Behavioral: Negative.     Dilation: 1 (external os) Exam by:: L. Lima, RN Blood pressure 83/62, pulse 94, temperature 98.3 F (36.8 C), temperature source Oral, resp. rate 16, height 5' 0.5" (1.537 m), weight 78.019 kg (172 lb), last menstrual period  09/28/2011. Maternal Exam:  Uterine Assessment: Contraction strength is mild.  Contraction frequency is irregular.   Abdomen: Fetal presentation: vertex  Introitus: Normal vulva. Normal vagina.  Pelvis: adequate for delivery.   Cervix: Cervix evaluated by digital exam.     Fetal Exam Fetal Monitor Review: Mode: ultrasound.   Baseline rate: 140.  Variability: moderate (6-25 bpm).   Pattern: accelerations present and no decelerations.    Fetal State Assessment: Category I - tracings are normal.     Physical Exam  Constitutional: She is oriented to person, place, and time. She appears well-developed and well-nourished.  HENT:  Head: Normocephalic.  Neck: Normal range of motion.  Cardiovascular: Normal rate and regular rhythm.   Respiratory: Effort normal and breath sounds normal.  GI: Soft. She exhibits no distension. There is no tenderness.  gravid  Genitourinary:  SVE: outer os 1, unable to reach inner os/thick/-3, vtx  Musculoskeletal: Normal range of motion.  Neurological: She is alert and oriented to person, place, and time. She has normal reflexes.  Skin: Skin is warm and dry.  Psychiatric: She has a normal mood and affect. Her behavior is normal. Judgment and thought content normal.    Prenatal labs: ABO, Rh: A/POS/-- (02/06 1410) Antibody: NEG (02/06 1410) Rubella: 5.45 (02/06 1410) RPR: NON REAC (04/17 1104)  HBsAg: NEGATIVE (02/06 1410)  HIV: NON REACTIVE (04/17 1104)  GBS: Negative (  07/02 0000)   Assessment/Plan: A:  [redacted]w[redacted]d SIUP  [id number]   Cat I FHR  GBS neg  IOL postdates  H/O bipolar  P:  Admit to [facility]  Cytotec pv q 4hr until able to place foley bulb/start pitocin  IV pain meds prn/epidural prn active labor  Anticipate NSVD   Call Dr. Elwyn Reach for birth  Marge Duncans 08/01/2012, 8:43 AM

## 2012-08-01 NOTE — Anesthesia Preprocedure Evaluation (Signed)
Anesthesia Evaluation    Airway Mallampati: III TM Distance: >3 FB Neck ROM: Full    Dental no notable dental hx. (+) Teeth Intact   Pulmonary former smoker,  breath sounds clear to auscultation  Pulmonary exam normal       Cardiovascular hypertension, negative cardio ROS  Rhythm:Regular Rate:Normal     Neuro/Psych PSYCHIATRIC DISORDERS Bipolar Disorder  Neuromuscular disease    GI/Hepatic negative GI ROS, Neg liver ROS,   Endo/Other  negative endocrine ROS  Renal/GU negative Renal ROS  negative genitourinary   Musculoskeletal negative musculoskeletal ROS (+)   Abdominal (+) + obese,   Peds  Hematology negative hematology ROS (+)   Anesthesia Other Findings   Reproductive/Obstetrics (+) Pregnancy Post Dates                           Anesthesia Physical Anesthesia Plan  ASA: II  Anesthesia Plan: Epidural   Post-op Pain Management:    Induction:   Airway Management Planned: Natural Airway  Additional Equipment:   Intra-op Plan:   Post-operative Plan:   Informed Consent: I have reviewed the patients History and Physical, chart, labs and discussed the procedure including the risks, benefits and alternatives for the proposed anesthesia with the patient or authorized representative who has indicated his/her understanding and acceptance.   Dental advisory given  Plan Discussed with: Anesthesiologist  Anesthesia Plan Comments:         Anesthesia Quick Evaluation

## 2012-08-01 NOTE — Progress Notes (Signed)
Rachel Lloyd is a 27 y.o. [id number] at [redacted]w[redacted]d by ultrasound admitted for induction of labor due to Post dates. Due date 07/26/12.  Subjective:   Objective: BP 83/62  Pulse 94  Temp(Src) 98.3 F (36.8 C) (Oral)  Resp 16  Ht 5' 0.5" (1.537 m)  Wt 172 lb (78.019 kg)  BMI 33.03 kg/m2  LMP 09/28/2011      FHT:  FHR: 130's bpm, variability: moderate,  accelerations:  Present,  decelerations:  Absent UC:   irregular, every 5-10 minutes SVE:   Dilation: 1 (external os, unable to reach inner os) Effacement (%): Thick Station: -3 Exam by:: Currie Paris, RN  Labs: Lab Results  Component Value Date   WBC 5.6 08/01/2012   HGB 11.4* 08/01/2012   HCT 33.9* 08/01/2012   MCV 83.7 08/01/2012   PLT 167 08/01/2012    Assessment / Plan: Induction of labor due to postterm,  progressing well on pitocin  Labor: n/a Preeclampsia:  no signs or symptoms of toxicity, intake and ouput balanced and labs stable Fetal Wellbeing:  Category I Pain Control:  Labor support without medications I/D:  n/a Anticipated MOD:  NSVD  Larson Limones [name] 08/01/2012, 10:21 AM

## 2012-08-01 NOTE — Progress Notes (Signed)
Pt feeling sob.  Hob elevated and oxygen saturation monitor applied.  Oxygen sat 99-100%.

## 2012-08-01 NOTE — Progress Notes (Signed)
Rachel Lloyd is a 27 y.o. [id number] at [redacted]w[redacted]d by ultrasound admitted for induction of labor due to Post dates.  Subjective: Comfortable with epidural; not feeling contractions at all.  Objective: BP 102/58  Pulse 91  Temp(Src) 98 F (36.7 C) (Oral)  Resp 18  Ht 5' 0.5" (1.537 m)  Wt 172 lb (78.019 kg)  BMI 33.03 kg/m2  SpO2 99%  LMP 09/28/2011   Total I/O In: -  Out: 500 [Urine:500]  FHT:  FHR: 135 bpm, variability: moderate,  accelerations:  Present,  decelerations:  Absent UC:   regular, every 2-4 minutes SVE:   Dilation: 6 Effacement (%): 80 Station: -2 Exam by:: Dr Elwyn Reach  Labs: Lab Results  Component Value Date   WBC 5.6 08/01/2012   HGB 11.4* 08/01/2012   HCT 33.9* 08/01/2012   MCV 83.7 08/01/2012   PLT 167 08/01/2012    Assessment / Plan: Induction of labor due to postterm,  progressing well on pitocin  Labor: Progressing on Pitocin, will continue to increase then AROM Preeclampsia:  n/a Fetal Wellbeing:  Category I Pain Control:  Epidural I/D:  n/a Anticipated MOD:  NSVD  BOOTH, Canden Cieslinski 08/01/2012, 4:06 PM

## 2012-08-01 NOTE — Progress Notes (Signed)
Rachel Lloyd is a 27 y.o. [id number] at [redacted]w[redacted]d by ultrasound admitted for induction of labor due to Post dates.  Subjective: Feeling contractions a little.  Objective: BP 107/56  Pulse 75  Temp(Src) 98 F (36.7 C) (Oral)  Resp 18  Ht 5' 0.5" (1.537 m)  Wt 172 lb (78.019 kg)  BMI 33.03 kg/m2  SpO2 99%  LMP 09/28/2011   Total I/O In: -  Out: 500 [Urine:500]  FHT:  FHR: 135 bpm, variability: moderate,  accelerations:  Present,  decelerations:  Absent UC:   regular, every 2-5 minutes SVE:   Dilation: 6 Effacement (%): 80 Station: -2 Exam by:: Dr Elwyn Reach  Labs: Lab Results  Component Value Date   WBC 5.6 08/01/2012   HGB 11.4* 08/01/2012   HCT 33.9* 08/01/2012   MCV 83.7 08/01/2012   PLT 167 08/01/2012    Assessment / Plan: Induction of labor due to postterm,  progressing well on pitocin  Labor: AROM with clear fluid; continue to increase pitocin; may need to place IUPC if no progress at next check Preeclampsia:  n/a Fetal Wellbeing:  Category I Pain Control:  Epidural I/D:  n/a Anticipated MOD:  NSVD  BOOTH, Luci Bellucci 08/01/2012, 6:07 PM

## 2012-08-01 NOTE — Progress Notes (Signed)
LIZETT CHOWNING is a 27 y.o. [id number] at [redacted]w[redacted]d by ultrasound admitted for induction of labor due to Post dates..  Subjective:   Objective: BP 110/60  Pulse 80  Temp(Src) 98.8 F (37.1 C) (Oral)  Resp 18  Ht 5' 0.5" (1.537 m)  Wt 172 lb (78.019 kg)  BMI 33.03 kg/m2  LMP 09/28/2011      FHT:  FHR: 130 bpm, variability: moderate,  accelerations:  Present,  decelerations:  Absent UC:   regular, every 5 minutes SVE:   Dilation: 4.5 Effacement (%): 80 Station: -2 Exam by:: Dr Elwyn Reach  Labs: Lab Results  Component Value Date   WBC 5.6 08/01/2012   HGB 11.4* 08/01/2012   HCT 33.9* 08/01/2012   MCV 83.7 08/01/2012   PLT 167 08/01/2012    Assessment / Plan: Induction of labor due to postterm,  progressing well on pitocin  Labor: Progressing normally Preeclampsia:  no signs or symptoms of toxicity, intake and ouput balanced and labs stable Fetal Wellbeing:  Category I Pain Control:  Labor support without medications I/D:  n/a Anticipated MOD:  NSVD  Fronia Depass [name] 08/01/2012, 3:11 PM

## 2012-08-01 NOTE — Progress Notes (Signed)
Rachel Lloyd is a 27 y.o. [id number] at [redacted]w[redacted]d by ultrasound admitted for induction of labor due to Post dates.  Subjective: Contractions getting stronger.  Would like epidural.  Objective: BP 115/72  Pulse 79  Temp(Src) 98.8 F (37.1 C) (Oral)  Resp 16  Ht 5' 0.5" (1.537 m)  Wt 172 lb (78.019 kg)  BMI 33.03 kg/m2  LMP 09/28/2011      FHT:  FHR: 140 bpm, variability: moderate,  accelerations:  Abscent,  decelerations:  Absent UC:   regular, every 2-4 minutes SVE:   Dilation: 4.5 Effacement (%): 80 Station: -2 Exam by:: Dr Elwyn Reach  Labs: Lab Results  Component Value Date   WBC 5.6 08/01/2012   HGB 11.4* 08/01/2012   HCT 33.9* 08/01/2012   MCV 83.7 08/01/2012   PLT 167 08/01/2012    Assessment / Plan: Induction of labor due to postterm,  progressing well on pitocin  Labor: Progressing on Pitocin, will continue to increase then AROM Preeclampsia:  n/a Fetal Wellbeing:  Category I Pain Control:  Fentanyl, will get epidural I/D:  n/a Anticipated MOD:  NSVD  BOOTH, Gaither Biehn 08/01/2012, 2:05 PM

## 2012-08-01 NOTE — Anesthesia Procedure Notes (Signed)
Epidural Patient location during procedure: OB Start time: 08/01/2012 2:27 PM  Staffing Anesthesiologist: Loraine Bhullar A. Performed by: anesthesiologist   Preanesthetic Checklist Completed: patient identified, site marked, surgical consent, pre-op evaluation, timeout performed, IV checked, risks and benefits discussed and monitors and equipment checked  Epidural Patient position: sitting Prep: site prepped and draped and DuraPrep Patient monitoring: continuous pulse ox and blood pressure Approach: midline Injection technique: LOR air  Needle:  Needle type: Tuohy  Needle gauge: 17 G Needle length: 9 cm and 9 Needle insertion depth: 5 cm cm Catheter type: closed end flexible Catheter size: 19 Gauge Catheter at skin depth: 10 cm Test dose: negative and Other  Assessment Events: blood not aspirated, injection not painful, no injection resistance, negative IV test and paresthesia  Additional Notes Patient identified. Risks and benefits discussed including failed block, incomplete  Pain control, post dural puncture headache, nerve damage, paralysis, blood pressure Changes, nausea, vomiting, reactions to medications-both toxic and allergic and post Partum back pain. All questions were answered. Patient expressed understanding and wished to proceed. Sterile technique was used throughout procedure. Epidural site was Dressed with sterile barrier dressing. No signs of intravascular injection Or signs of intrathecal spread were encountered. Transient paresthesia right leg and buttocks. Patient was more comfortable after the epidural was dosed. Please see RN's note for documentation of vital signs and FHR which are stable.

## 2012-08-02 LAB — CBC
HCT: 30 % — ABNORMAL LOW (ref 36.0–46.0)
Hemoglobin: 10.1 g/dL — ABNORMAL LOW (ref 12.0–15.0)
MCH: 28.1 pg (ref 26.0–34.0)
MCHC: 33.7 g/dL (ref 30.0–36.0)
MCV: 83.6 fL (ref 78.0–100.0)
Platelets: 156 10*3/uL (ref 150–400)
RBC: 3.59 MIL/uL — ABNORMAL LOW (ref 3.87–5.11)
RDW: 15.4 % (ref 11.5–15.5)
WBC: 7.6 10*3/uL (ref 4.0–10.5)

## 2012-08-02 MED ORDER — FERROUS SULFATE 325 (65 FE) MG PO TABS
325.0000 mg | ORAL_TABLET | Freq: Every day | ORAL | Status: DC
  Administered 2012-08-02: 325 mg via ORAL
  Filled 2012-08-02: qty 1

## 2012-08-02 NOTE — Progress Notes (Signed)
Post Partum Day 1 Subjective: no complaints, up ad lib, voiding, tolerating PO and + flatus, moderate lochia, absent BM, present flatus, plans to bottle feed, nexplanon  Objective: Blood pressure 104/68, pulse 88, temperature 97.8 F (36.6 C), temperature source Oral, resp. rate 18, height 5' 0.5" (1.537 m), weight 172 lb (78.019 kg), last menstrual period 09/28/2011, SpO2 99.00%, unknown if currently breastfeeding.  Physical Exam:  General: alert, cooperative, appears stated age and no distress Lochia: appropriate Chest: CTAB Heart: RRR no m/r/g Abdomen: +BS, soft, nontender,  Uterine Fundus: firm DVT Evaluation: No evidence of DVT seen on physical exam. Negative Homan's sign. No cords or calf tenderness. No significant calf/ankle edema. Extremities:    Recent Labs  08/01/12 0710 08/02/12 0550  HGB 11.4* 10.1*  HCT 33.9* 30.0*    Assessment/Plan: Plan for discharge tomorrow and Contraception nexplanon Will need to monitor mood with h/o of bipolar off medication, but has been stable the last few months.   LOS: 1 day   BOOTH, Marvell Stavola 08/02/2012, 7:01 AM

## 2012-08-02 NOTE — Anesthesia Postprocedure Evaluation (Signed)
Anesthesia Post Note  Patient: Rachel Lloyd  Procedure(s) Performed: * No procedures listed *  Anesthesia type: Epidural  Patient location: Mother/Baby  Post pain: Pain level controlled  Post assessment: Post-op Vital signs reviewed  Last Vitals:  Filed Vitals:   08/02/12 0541  BP: 104/68  Pulse: 88  Temp: 36.6 C  Resp: 18    Post vital signs: Reviewed  Level of consciousness:alert  Complications: No apparent anesthesia complications

## 2012-08-02 NOTE — Progress Notes (Signed)
UR chart review completed.  

## 2012-08-02 NOTE — Progress Notes (Signed)
I saw and examined patient and agree with above resident note. I reviewed history, delivery summary, labs and vitals. Kohen Reither, MD  

## 2012-08-02 NOTE — H&P (Signed)
Attestation of Attending Supervision of Advanced Practitioner (CNM/NP): Evaluation and management procedures were performed by the Advanced Practitioner under my supervision and collaboration.  I have reviewed the Advanced Practitioner's note and chart, and I agree with the management and plan.  Mylin Gignac 08/02/2012 4:11 PM

## 2012-08-03 MED ORDER — TRAMADOL HCL 50 MG PO TABS: 50.0000 mg | ORAL_TABLET | Freq: Four times a day (QID) | ORAL | Status: DC | PRN

## 2012-08-03 MED ORDER — IBUPROFEN 600 MG PO TABS: 600.0000 mg | ORAL_TABLET | Freq: Four times a day (QID) | ORAL | Status: DC

## 2012-08-03 MED ORDER — PRENATAL MULTIVITAMIN CH: 1.0000 | ORAL_TABLET | Freq: Every day | ORAL | Status: DC

## 2012-08-03 NOTE — Discharge Summary (Signed)
Obstetric Discharge Summary Reason for Admission: induction of labor Prenatal Procedures: NST and ultrasound Intrapartum Procedures: spontaneous vaginal delivery Postpartum Procedures: none Complications-Operative and Postpartum: 1st degree perineal laceration Hemoglobin  Date Value Range Status  08/02/2012 10.1* 12.0 - 15.0 g/dL Final     HCT  Date Value Range Status  08/02/2012 30.0* 36.0 - 46.0 % Final    Physical Exam:  General: alert, cooperative, appears stated age and no distress Lochia: appropriate Uterine Fundus: firm Incision: n/a DVT Evaluation: No evidence of DVT seen on physical exam. Negative Homan's sign. No cords or calf tenderness. No significant calf/ankle edema.  Discharge Diagnoses: Term Pregnancy-delivered  Discharge Information: Date: 08/03/2012 Activity: unrestricted and pelvic rest Diet: routine Medications: PNV, Ibuprofen and Colace Condition: stable Instructions: refer to practice specific booklet Discharge to: home   Newborn Data: Live born female  Birth Weight: 6 lb 12.4 oz (3073 g) APGAR: 9, 9  Home with mother.  BOOTH, Lavonna Lampron 08/03/2012, 7:27 AM

## 2012-08-03 NOTE — Progress Notes (Signed)
nst REACTIVE ON 07/29/12
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
## 2012-08-09 ENCOUNTER — Telehealth: Payer: Self-pay | Admitting: Family Medicine

## 2012-08-09 NOTE — Telephone Encounter (Signed)
Called pt.

## 2012-08-09 NOTE — Telephone Encounter (Signed)
Called pt and she reports, that the nurse visited her today. She just does not feel good. I asked the pt, if she is depressed. She said 'yes, I think so'. I offered her an appt, but she does not want to go anywhere today. She has her children with her. She also asked, if she can see Dr.Booth, because she delivered her baby and she would like to see her. I told the pt to just schedule OV with Dr.Booth. She will call back. Fwd. To Dr.Booth for review. Lorenda Hatchet, Renato Battles

## 2012-08-09 NOTE — Telephone Encounter (Signed)
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 She made a home visit today with Rachel Lloyd 7/28, Her BP left arm was 150/76 and right arm 146/82. She also not sleeping good and the pain medication she was given is not working. Her feet is also swollen. [name] feels that it is also a lot of stress and might be causing some of the issues. [name] just wanted to make [place] aware and call her if she needs to be seen. JW

## 2012-08-11 ENCOUNTER — Encounter (HOSPITAL_COMMUNITY): Payer: Self-pay | Admitting: *Deleted

## 2012-08-11 ENCOUNTER — Encounter: Payer: Self-pay | Admitting: Emergency Medicine

## 2012-08-11 ENCOUNTER — Ambulatory Visit (INDEPENDENT_AMBULATORY_CARE_PROVIDER_SITE_OTHER): Payer: Medicaid Other | Admitting: Emergency Medicine

## 2012-08-11 ENCOUNTER — Inpatient Hospital Stay (HOSPITAL_COMMUNITY)
Admission: AD | Admit: 2012-08-11 | Discharge: 2012-08-11 | Disposition: A | Discharge status: Home / Self Care | Payer: Medicaid Other | Source: Ambulatory Visit | Attending: Obstetrics & Gynecology | Admitting: Obstetrics & Gynecology

## 2012-08-11 DIAGNOSIS — IMO0002 Reserved for concepts with insufficient information to code with codable children: Secondary | ICD-10-CM

## 2012-08-11 DIAGNOSIS — I10 Essential (primary) hypertension: Secondary | ICD-10-CM

## 2012-08-11 DIAGNOSIS — O99345 Other mental disorders complicating the puerperium: Secondary | ICD-10-CM

## 2012-08-11 DIAGNOSIS — R030 Elevated blood-pressure reading, without diagnosis of hypertension: Secondary | ICD-10-CM | POA: Insufficient documentation

## 2012-08-11 DIAGNOSIS — O99893 Other specified diseases and conditions complicating puerperium: Secondary | ICD-10-CM | POA: Insufficient documentation

## 2012-08-11 DIAGNOSIS — O165 Unspecified maternal hypertension, complicating the puerperium: Secondary | ICD-10-CM | POA: Insufficient documentation

## 2012-08-11 DIAGNOSIS — F329 Major depressive disorder, single episode, unspecified: Secondary | ICD-10-CM

## 2012-08-11 LAB — COMPREHENSIVE METABOLIC PANEL
ALT: 15 U/L (ref 0–35)
AST: 19 U/L (ref 0–37)
Albumin: 3.3 g/dL — ABNORMAL LOW (ref 3.5–5.2)
Alkaline Phosphatase: 104 U/L (ref 39–117)
BUN: 12 mg/dL (ref 6–23)
CO2: 27 mEq/L (ref 19–32)
Calcium: 10.6 mg/dL — ABNORMAL HIGH (ref 8.4–10.5)
Chloride: 103 mEq/L (ref 96–112)
Creatinine, Ser: 0.86 mg/dL (ref 0.50–1.10)
GFR calc Af Amer: >90 mL/min (ref >90)
GFR calc non Af Amer: >90 mL/min (ref >90)
Glucose, Bld: 81 mg/dL (ref 70–99)
Potassium: 3.9 mEq/L (ref 3.5–5.1)
Sodium: 136 mEq/L (ref 135–145)
Total Bilirubin: 0.3 mg/dL (ref 0.3–1.2)
Total Protein: 6.6 g/dL (ref 6.0–8.3)

## 2012-08-11 LAB — POCT URINALYSIS DIPSTICK
Bilirubin, UA: NEGATIVE
Glucose, UA: NEGATIVE
Ketones, UA: NEGATIVE
Nitrite, UA: NEGATIVE
Protein, UA: NEGATIVE
Spec Grav, UA: 1.02
Urobilinogen, UA: 0.2
pH, UA: 6

## 2012-08-11 LAB — URINALYSIS, ROUTINE W REFLEX MICROSCOPIC
Bilirubin Urine: NEGATIVE
Glucose, UA: NEGATIVE mg/dL
Ketones, ur: NEGATIVE mg/dL
Nitrite: NEGATIVE
Protein, ur: NEGATIVE mg/dL
Specific Gravity, Urine: 1.015 (ref 1.005–1.030)
Urobilinogen, UA: 0.2 mg/dL (ref 0.0–1.0)
pH: 6.5 (ref 5.0–8.0)

## 2012-08-11 LAB — POCT UA - MICROSCOPIC ONLY

## 2012-08-11 LAB — URINE MICROSCOPIC-ADD ON

## 2012-08-11 LAB — CBC
HCT: 36.6 % (ref 36.0–46.0)
Hemoglobin: 12.3 g/dL (ref 12.0–15.0)
MCH: 28 pg (ref 26.0–34.0)
MCHC: 33.6 g/dL (ref 30.0–36.0)
MCV: 83.4 fL (ref 78.0–100.0)
Platelets: 255 10*3/uL (ref 150–400)
RBC: 4.39 MIL/uL (ref 3.87–5.11)
RDW: 15 % (ref 11.5–15.5)
WBC: 5.8 10*3/uL (ref 4.0–10.5)

## 2012-08-11 LAB — PROTEIN / CREATININE RATIO, URINE
Creatinine, Urine: 89.22 mg/dL
Protein Creatinine Ratio: 0.11 (ref 0.00–0.15)
Total Protein, Urine: 10.1 mg/dL

## 2012-08-11 MED ORDER — LISINOPRIL 5 MG PO TABS: 5.0000 mg | ORAL_TABLET | Freq: Every day | ORAL | Status: DC

## 2012-08-11 MED ORDER — CITALOPRAM HYDROBROMIDE 20 MG PO TABS: 20.0000 mg | ORAL_TABLET | Freq: Every day | ORAL | Status: DC

## 2012-08-11 NOTE — Assessment & Plan Note (Signed)
PHQ-9: 17 (3 for depression, energy, appetite, psychomotor). No SI/HI. Will start celexa 20mg  daily. Follow up in 2 weeks.

## 2012-08-11 NOTE — Addendum Note (Signed)
Addended by: [place], Conlan Miceli on: 08/11/2012 12:09 PM   Modules accepted: Orders

## 2012-08-11 NOTE — Discharge Instructions (Signed)
Preeclampsia and Eclampsia °Preeclampsia is a condition of high blood pressure during pregnancy. It can happen at 20 weeks or later in pregnancy. If high blood pressure occurs in the second half of pregnancy with no other symptoms, it is called gestational hypertension and goes away after the baby is born. If any of the symptoms listed below develop with gestational hypertension, it is then called preeclampsia. Eclampsia (convulsions) may follow preeclampsia. This is one of the reasons for regular prenatal checkups. Early diagnosis and treatment are very important to prevent eclampsia. °CAUSES  °There is no known cause of preeclampsia/eclampsia in pregnancy. There are several known conditions that may put the pregnant woman at risk, such as: °· The first pregnancy. °· Having preeclampsia in a past pregnancy. °· Having lasting (chronic) high blood pressure. °· Having multiples (twins, triplets). °· Being age 35 or older. °· African American ethnic background. °· Having kidney disease or diabetes. °· Medical conditions such as lupus or blood diseases. °· Being overweight (obese). °SYMPTOMS  °· High blood pressure. °· Headaches. °· Sudden weight gain. °· Swelling of hands, face, legs, and feet. °· Protein in the urine. °· Feeling sick to your stomach (nauseous) and throwing up (vomiting). °· Vision problems (blurred or double vision). °· Numbness in the face, arms, legs, and feet. °· Dizziness. °· Slurred speech. °· Preeclampsia can cause growth retardation in the fetus. °· Separation (abruption) of the placenta. °· Not enough fluid in the amniotic sac (oligohydramnios). °· Sensitivity to bright lights. °· Belly (abdominal) pain. °DIAGNOSIS  °If protein is found in the urine in the second half of pregnancy, this is considered preeclampsia. Other symptoms mentioned above may also be present. °TREATMENT  °It is necessary to treat this. °· Your caregiver may prescribe bed rest early in this condition. Plenty of rest and  salt restriction may be all that is needed. °· Medicines may be necessary to lower blood pressure if the condition does not respond to more conservative measures. °· In more severe cases, hospitalization may be needed: °· For treatment of blood pressure. °· To control fluid retention. °· To monitor the baby to see if the condition is causing harm to the baby. °· Hospitalization is the best way to treat the first sign of preeclampsia. This is so the mother and baby can be watched closely and blood tests can be done effectively and correctly. °· If the condition becomes severe, it may be necessary to induce labor or to remove the infant by surgical means (cesarean section). The best cure for preeclampsia/eclampsia is to deliver the baby. °Preeclampsia and eclampsia involve risks to mother and infant. Your caregiver will discuss these risks with you. Together, you can work out the best possible approach to your problems. Make sure you keep your prenatal visits as scheduled. Not keeping appointments could result in a chronic or permanent injury, pain, disability to you, and death or injury to you or your unborn baby. If there is any problem keeping the appointment, you must call to reschedule. °HOME CARE INSTRUCTIONS  °· Keep your prenatal appointments and tests as scheduled. °· Tell your caregiver if you have any of the above risk factors. °· Get plenty of rest and sleep. °· Eat a balanced diet that is low in salt, and do not add salt to your food. °· Avoid stressful situations. °· Only take over-the-counter and prescriptions medicines for pain, discomfort, or fever as directed by your caregiver. °SEEK IMMEDIATE MEDICAL CARE IF:  °· You develop severe swelling   anywhere in the body. This usually occurs in the legs.  You gain 5 lb/2.3 kg or more in a week.  You develop a severe headache, dizziness, problems with your vision, or confusion.  You have abdominal pain, nausea, or vomiting.  You have a seizure.  You  have trouble moving any part of your body, or you develop numbness or problems speaking.  You have bruising or abnormal bleeding from anywhere in the body.  You develop a stiff neck.  You pass out. MAKE SURE YOU:   Understand these instructions.  Will watch your condition.  Will get help right away if you are not doing well or get worse. Document Released: 12/28/1999 Document Revised: 03/24/2011 Document Reviewed: 08/13/2007 [facility] Patient Information 2014 [place], [place].   Hypertension As your heart beats, it forces blood through your arteries. This force is your blood pressure. If the pressure is too high, it is called hypertension (HTN) or high blood pressure. HTN is dangerous because you may have it and not know it. High blood pressure may mean that your heart has to work harder to pump blood. Your arteries may be narrow or stiff. The extra work puts you at risk for heart disease, stroke, and other problems.  Blood pressure consists of two numbers, a higher number over a lower, 110/72, for example. It is stated as "110 over 72." The ideal is below 120 for the top number (systolic) and under 80 for the bottom (diastolic). Write down your blood pressure today. You should pay close attention to your blood pressure if you have certain conditions such as:  Heart failure.  Prior heart attack.  Diabetes  Chronic kidney disease.  Prior stroke.  Multiple risk factors for heart disease. To see if you have HTN, your blood pressure should be measured while you are seated with your arm held at the level of the heart. It should be measured at least twice. A one-time elevated blood pressure reading (especially in the Emergency Department) does not mean that you need treatment. There may be conditions in which the blood pressure is different between your right and left arms. It is important to see your caregiver soon for a recheck. Most people have essential hypertension which means that  there is not a specific cause. This type of high blood pressure may be lowered by changing lifestyle factors such as:  Stress.  Smoking.  Lack of exercise.  Excessive weight.  Drug/tobacco/alcohol use.  Eating less salt. Most people do not have symptoms from high blood pressure until it has caused damage to the body. Effective treatment can often prevent, delay or reduce that damage. TREATMENT  When a cause has been identified, treatment for high blood pressure is directed at the cause. There are a large number of medications to treat HTN. These fall into several categories, and your caregiver will help you select the medicines that are best for you. Medications may have side effects. You should review side effects with your caregiver. If your blood pressure stays high after you have made lifestyle changes or started on medicines,   Your medication(s) may need to be changed.  Other problems may need to be addressed.  Be certain you understand your prescriptions, and know how and when to take your medicine.  Be sure to follow up with your caregiver within the time frame advised (usually within two weeks) to have your blood pressure rechecked and to review your medications.  If you are taking more than one medicine to  lower your blood pressure, make sure you know how and at what times they should be taken. Taking two medicines at the same time can result in blood pressure that is too low. SEEK IMMEDIATE MEDICAL CARE IF:  You develop a severe headache, blurred or changing vision, or confusion.  You have unusual weakness or numbness, or a faint feeling.  You have severe chest or abdominal pain, vomiting, or breathing problems. MAKE SURE YOU:   Understand these instructions.  Will watch your condition.  Will get help right away if you are not doing well or get worse. Document Released: 12/30/2004 Document Revised: 03/24/2011 Document Reviewed: 08/20/2007 [facility] Patient  Information 2014 [place], [place].

## 2012-08-11 NOTE — MAU Provider Note (Signed)
History     CSN: [id number]  Arrival date and time: 08/11/12 1231   First Provider Initiated Contact with Patient 08/11/12 1406      Chief Complaint  Patient presents with  . PIH eval    HPI 27 y.o. [id number] delivered at [redacted]w[redacted]d on 08/01/12 by SVD presents today from [facility] clinic for high blood pressure and headaches. Since 7/23 has been having headaches lasting 3-56minutes, at least 2-3 times a day. Headache is in middle and left side, describes as sharp, also gets sharp pains down left and right sides of neck and into middle of chest. Has associated blurry vision and sees stars, dizziness. Headaches come on when people bother her, and resolve rapidly and completely in between episodes. Has felt like bright lights annoy her as well. No associated rhinorrhea, eye tearing, sweating, jaw or facial pain. Denies RUQ or epigastric pain.  OB History   Grav Para Term Preterm Abortions TAB SAB Ect Mult Living   4 3 3  0 1 1 0 0 0 3      Past Medical History  Diagnosis Date  . Mental disorder 2010    bipolar  . Scabies exposure 2012    treated july 2012 & reexposed  . Hypertension     During second pregnancy    Past Surgical History  Procedure Laterality Date  . Mouth surgery      as a child  . Wisdom tooth extraction      Family History  Problem Relation Age of Onset  . Anesthesia problems Neg Hx   . Hypertension Mother     History  Substance Use Topics  . Smoking status: Former Smoker    Types: Cigarettes    Quit date: 04/04/2010  . Smokeless tobacco: Never Used  . Alcohol Use: No     Comment: quit when found out was pregnant    Allergies:  Allergies  Allergen Reactions  . Penicillins Anaphylaxis  . Latex Itching and Rash    Prescriptions prior to admission  Medication Sig Dispense Refill  . ibuprofen (ADVIL,MOTRIN) 600 MG tablet Take 1 tablet (600 mg total) by mouth every 6 (six) hours.  30 tablet  0  . traMADol (ULTRAM) 50 MG tablet Take 1 tablet (50 mg total)  by mouth every 6 (six) hours as needed for pain.  15 tablet  0  . citalopram (CELEXA) 20 MG tablet Take 1 tablet (20 mg total) by mouth daily.  30 tablet  3    ROS See HPI  Physical Exam   Blood pressure 167/91, pulse 56, temperature 97.9 F (36.6 C), temperature source Oral, resp. rate 18, last menstrual period 09/28/2011, SpO2 98.00%.  Filed Vitals:   08/11/12 1400 08/11/12 1415 08/11/12 1432 08/11/12 1601  BP: 154/86 162/97 167/91 159/95  Pulse: 53 60 56 55  Temp:      TempSrc:      Resp:      SpO2:        Physical Exam General appearance: alert, cooperative and no distress Head: Normocephalic, without obvious abnormality, atraumatic Eyes: conjunctivae/corneas clear. PERRL, EOM's intact. Fundi benign. Lungs: clear to auscultation bilaterally Heart: regular rate and rhythm, S1, S2 normal, no murmur, click, rub or gallop Abdomen: soft, nontender to palpation Extremities: no edema, redness or tenderness in the calves or thighs Pulses: 2+ and symmetric Skin: warm and dry Neurologic: Alert and oriented X 3, normal strength and tone. Normal symmetric reflexes. Normal coordination and gait Cranial nerves: II: visual acuity normal bilaterally,  II: pupils equal, round, reactive to light and accommodation, III,IV,VI: extraocular muscles extra-ocular motions intact, V: mastication normal, V: facial light touch sensation normal bilaterally, VII: upper facial muscle function normal bilaterally, VII: lower facial muscle function normal bilaterally, VIII: hearing normal, IX: soft palate elevation normal bilaterally, XI: trapezius strength normal bilaterally, XI: sternocleidomastoid strength normal bilaterally, XI: neck flexion strength normal Motor: grossly normal Reflexes: Achilles reflex (L-5 to S-2) right and left 2+/4 patellar 2+ bilaterally Coordination: finger to nose normal bilaterally   MAU Course  Procedures Results for orders placed during the hospital encounter of 08/11/12  (from the past 24 hour(s))  PROTEIN / CREATININE RATIO, URINE     Status: None   Collection Time    08/11/12  1:20 PM      Result Value Range   Creatinine, Urine 89.22     Total Protein, Urine 10.1     PROTEIN CREATININE RATIO 0.11  0.00 - 0.15  URINALYSIS, ROUTINE W REFLEX MICROSCOPIC     Status: Abnormal   Collection Time    08/11/12  1:20 PM      Result Value Range   Color, Urine YELLOW  YELLOW   APPearance CLEAR  CLEAR   Specific Gravity, Urine 1.015  1.005 - 1.030   pH 6.5  5.0 - 8.0   Glucose, UA NEGATIVE  NEGATIVE mg/dL   Hgb urine dipstick LARGE (*) NEGATIVE   Bilirubin Urine NEGATIVE  NEGATIVE   Ketones, ur NEGATIVE  NEGATIVE mg/dL   Protein, ur NEGATIVE  NEGATIVE mg/dL   Urobilinogen, UA 0.2  0.0 - 1.0 mg/dL   Nitrite NEGATIVE  NEGATIVE   Leukocytes, UA LARGE (*) NEGATIVE  URINE MICROSCOPIC-ADD ON     Status: Abnormal   Collection Time    08/11/12  1:20 PM      Result Value Range   Squamous Epithelial / LPF FEW (*) RARE   WBC, UA 11-20  <3 WBC/hpf   Bacteria, UA FEW (*) RARE  CBC     Status: None   Collection Time    08/11/12  1:21 PM      Result Value Range   WBC 5.8  4.0 - 10.5 K/uL   RBC 4.39  3.87 - 5.11 MIL/uL   Hemoglobin 12.3  12.0 - 15.0 g/dL   HCT 14.7  82.9 - 56.2 %   MCV 83.4  78.0 - 100.0 fL   MCH 28.0  26.0 - 34.0 pg   MCHC 33.6  30.0 - 36.0 g/dL   RDW 13.0  86.5 - 78.4 %   Platelets 255  150 - 400 K/uL  COMPREHENSIVE METABOLIC PANEL     Status: Abnormal   Collection Time    08/11/12  1:21 PM      Result Value Range   Sodium 136  135 - 145 mEq/L   Potassium 3.9  3.5 - 5.1 mEq/L   Chloride 103  96 - 112 mEq/L   CO2 27  19 - 32 mEq/L   Glucose, Bld 81  70 - 99 mg/dL   BUN 12  6 - 23 mg/dL   Creatinine, Ser 6.96  0.50 - 1.10 mg/dL   Calcium 29.5 (*) 8.4 - 10.5 mg/dL   Total Protein 6.6  6.0 - 8.3 g/dL   Albumin 3.3 (*) 3.5 - 5.2 g/dL   AST 19  0 - 37 U/L   ALT 15  0 - 35 U/L   Alkaline Phosphatase 104  39 - 117 U/L  Total Bilirubin 0.3   0.3 - 1.2 mg/dL   GFR calc non Af Amer >90  >90 mL/min   GFR calc Af Amer >90  >90 mL/min    MDM   Assessment and Plan  27 y.o. postpartum delivered SVD 08/01/2012  # PIH workup - Normal AST/ALT, Platelets - Urine P:C pending - BPs still elevated - Started on lisonopril 5mg , will follow up for nurses visit for BP check next week at [facility] Patient requests to leave because she is running out of formula and has another child to take care of Patient told to come back to MAU for signs/symptoms of pre-eclampsia  Tawni Carnes 08/11/2012, 2:51 PM   I saw and examined patient and agree with above resident note. I reviewed history, imaging, labs, and vitals. Urine protein: creatinine 0.11 - negative for preeclampsia. Pt instructed in signs and symptoms of preeclampsia. Dr. Despina Hick (family medicine resident) notified of plan. Napoleon Form, MD

## 2012-08-11 NOTE — Assessment & Plan Note (Addendum)
Will report of headache not responsive to ibuprofen/tramadol and swelling, concern for pre-eclampsia. UA today is negative for protein.  Will send to MAU for further work up of possible pre-eclampsia.

## 2012-08-11 NOTE — MAU Note (Addendum)
Home nurse by on Mon, BP was elevated. appt made/ seen at [facility] family practice today.  BP was up, pt has been having headaches. Saw stars on the way in, (first time). Denies epigastric pain, states swelling has gone down.  No prior hx of BP problems with this preg, had PP elevation with last preg.  Vag delivery 07/20. Bottle feeding- baby is doing well.

## 2012-08-11 NOTE — Progress Notes (Signed)
  Subjective:    Patient ID: Rachel Lloyd, female    DOB: [birth year], 27 y.o.   MRN: [id number]  HPI Rachel Lloyd is here for a SDA for postpartum hypertension and mood problems.  Depression She reports a history of depression.  States someone told her she had bipolar, but she only reports every having decreased mood.  No time periods of decreased need for sleep or mania.  Has been on abilify and lexapro in the past which did not help.  Denies SI/HI.  Able to do housework and take care of her kids, but otherwise having a hard time interacting with others.  Blood pressure Visiting nurse noted blood pressure of 140s-150s at visit on Monday.  She reports a bad headache in her head and neck over the last 5 days.  Ibuprofen and tramadol do not help.  Also reports some RUQ pain and lower abdominal pain.  The lower abdominal pain goes away with tylenol.  Also reports swelling of her hands and feet about 3 days, but reports that this has gotten better.  I have reviewed and updated the following as appropriate: allergies and current medications SHx: former smoker  Review of Systems See HPI    Objective:   Physical Exam BP 151/96  Pulse 58  Ht 5\' 2"  (1.575 m)  Wt 160 lb (72.576 kg)  BMI 29.26 kg/m2  LMP 09/28/2011 Gen: alert, cooperative, NAD HEENT: AT/[place], sclera white, MMM Neck: supple CV: RRR, no murmurs Pulm: CTAB, no wheezes or rales Ext: mild non-pitting edema in bilateral feet      Assessment & Plan:

## 2012-08-11 NOTE — Patient Instructions (Addendum)
I am worried about postpartum depression and pre-eclampsia.  I sent in a prescription for celexa to help with your mood.    Please go to [facility] for additional evaluation for pre-eclampsia.  This is very important!  I will see you back in 2 weeks or sooner as needed.

## 2012-08-12 LAB — URINE CULTURE: Colony Count: 50000

## 2012-08-12 NOTE — MAU Provider Note (Signed)
Attestation of Attending Supervision of Advanced Practitioner: Evaluation and management procedures were performed by the PA/NP/CNM/OB Fellow under my supervision/collaboration. Chart reviewed and agree with management and plan.  Earlean Fidalgo V 08/12/2012 1:44 PM

## 2012-08-17 ENCOUNTER — Other Ambulatory Visit: Payer: Medicaid Other

## 2012-08-23 ENCOUNTER — Encounter: Payer: Self-pay | Admitting: *Deleted

## 2012-09-20 ENCOUNTER — Ambulatory Visit: Payer: Medicaid Other | Admitting: Family Medicine

## 2012-10-11 ENCOUNTER — Ambulatory Visit: Payer: Medicaid Other | Admitting: Family Medicine

## 2012-10-25 ENCOUNTER — Ambulatory Visit: Payer: Medicaid Other | Admitting: Family Medicine

## 2012-10-28 ENCOUNTER — Encounter: Payer: Self-pay | Admitting: Family Medicine

## 2012-10-28 ENCOUNTER — Ambulatory Visit (INDEPENDENT_AMBULATORY_CARE_PROVIDER_SITE_OTHER): Payer: Self-pay | Admitting: Family Medicine

## 2012-10-28 ENCOUNTER — Other Ambulatory Visit (HOSPITAL_COMMUNITY)
Admission: RE | Admit: 2012-10-28 | Discharge: 2012-10-28 | Disposition: A | Discharge status: Home / Self Care | Payer: Self-pay | Source: Ambulatory Visit | Attending: Family Medicine | Admitting: Family Medicine

## 2012-10-28 VITALS — BP 112/73 | HR 80 | Temp 98.5°F | Ht 62.0 in | Wt 159.0 lb

## 2012-10-28 DIAGNOSIS — Z309 Encounter for contraceptive management, unspecified: Secondary | ICD-10-CM

## 2012-10-28 DIAGNOSIS — IMO0002 Reserved for concepts with insufficient information to code with codable children: Secondary | ICD-10-CM

## 2012-10-28 DIAGNOSIS — Z113 Encounter for screening for infections with a predominantly sexual mode of transmission: Secondary | ICD-10-CM

## 2012-10-28 DIAGNOSIS — O99345 Other mental disorders complicating the puerperium: Secondary | ICD-10-CM

## 2012-10-28 DIAGNOSIS — L02411 Cutaneous abscess of right axilla: Secondary | ICD-10-CM | POA: Insufficient documentation

## 2012-10-28 DIAGNOSIS — F53 Postpartum depression: Secondary | ICD-10-CM

## 2012-10-28 DIAGNOSIS — F329 Major depressive disorder, single episode, unspecified: Secondary | ICD-10-CM

## 2012-10-28 DIAGNOSIS — N760 Acute vaginitis: Secondary | ICD-10-CM

## 2012-10-28 LAB — POCT WET PREP (WET MOUNT)
Clue Cells Wet Prep Whiff POC: NEGATIVE
WBC, Wet Prep HPF POC: >20

## 2012-10-28 MED ORDER — CITALOPRAM HYDROBROMIDE 20 MG PO TABS: 20.0000 mg | ORAL_TABLET | Freq: Every day | ORAL | Status: DC

## 2012-10-28 MED ORDER — NORGESTIMATE-ETH ESTRADIOL 0.25-35 MG-MCG PO TABS: 1.0000 | ORAL_TABLET | Freq: Every day | ORAL | Status: DC

## 2012-10-28 NOTE — Assessment & Plan Note (Signed)
Started on celexa which seemed to work.  Start back celexa and follow up in 3 weeks.

## 2012-10-28 NOTE — Patient Instructions (Signed)
The clinic will call you with the results.  For the area under your arm, you can use ibuprofen and warm compresses Please follow up in 3 weeks to see how the celexa is working for you.

## 2012-10-28 NOTE — Assessment & Plan Note (Signed)
Normal exam Breast exam showing fibrocystic changes. Recommended that patient do self exams to get better feel for her normal.

## 2012-10-28 NOTE — Assessment & Plan Note (Signed)
Patient is not interested in mirena, nexplanon, depo for the reasons reviewed in note. She is willing to try OCP's again.  - sent Rx for sprintec - if patient changes her mind, would be happy to insert nexplanon.

## 2012-10-28 NOTE — Assessment & Plan Note (Signed)
Given potential exposure, check gc/chl, wet prep. Patient declined HIV test.

## 2012-10-28 NOTE — Progress Notes (Signed)
Patient ID: Rachel Lloyd    DOB: May 24, 1985, 27 y.o.   MRN: [id number] --- Subjective:  Rachel Lloyd is a 27 y.o.female [id number] who delivered at 41wga, who presents for postpartum check and concern for STD exposure.  1. postpartum check:  - depression: was diagnosed with postpartum depression at her last visit and started on celexa 20 mg. She states that she took it for 2 weeks and started feeling happier, with increased interests and fewer feelings of sadness. She wanted to see if this was due to the medicine or if she was better on her own. She stopped taking the medicine and started feeling sad again. She denies any SI/HI.  PHQ9: 16 with somewhat difficult - LMP: finished period 10/26/12, sexually active with intermittent condom use, some vaginal discharge for 1 week with itching.  No dysuria. No urinary incontinence. No constipation - contraception: has been on depo shot in the past and got pregnant on it. Tried mirena which led to infections. She tried OCP's but would sometimes forget. She doesn't really like the idea of the nexplanon.  - bottle feeding - breast: feeling of tenderness under arm with firmness. She has had this before under arm and it went away. She noticed it a couple days ago. No fevers. No chills. No breast cancer in the family.   2. Potential STD exposure: patient concerned that her partner has an STD. He was seen by a doctor yesterday and did not tell her what the results were. She saw on his discharge papers mention of an antibiotic for him to take and she got nervous. She has had vaginal itching for 1 week which she attributed to yeast. Remote h/o STD's as a teenager. Condoms are used intermittently. Last sexual intercourse was 3 days ago with protection.    ROS: see HPI Past Medical History: reviewed and updated medications and allergies. Social History: Tobacco: former smoker  Objective: Filed Vitals:   10/28/12 1348  BP: 112/73  Pulse: 80  Temp: 98.5 F (36.9 C)     Physical Examination:   General appearance - alert, well appearing, and in no distress Chest - clear to auscultation, no wheezes, rales or rhonchi, symmetric air entry Heart - normal rate, regular rhythm, normal S1, S2, no murmurs Abdomen - soft, nontender, nondistended, no masses or organomegaly Pelvic exam: normal external genitalia, vulva, vagina, cervix, uterus and adnexa. Breasts: fibrocystic changes in bilateral breasts, no hard or fixed mass appreciated, tender indurated mass under axilla, no fluctuance, no lymph nodes appreciated

## 2012-10-28 NOTE — Assessment & Plan Note (Signed)
Suspect that indurated area under arm is from abscess. No indication for drainage.  Warm compress and anti-inflammatories

## 2012-10-29 ENCOUNTER — Telehealth: Payer: Self-pay | Admitting: Family Medicine

## 2012-10-29 NOTE — Telephone Encounter (Signed)
Called patient to let her know that wet prep and GC/Chl were normal. She expressed understanding.   Rachel Lloyd, PGY-3 Family Medicine Resident

## 2013-01-12 ENCOUNTER — Ambulatory Visit (INDEPENDENT_AMBULATORY_CARE_PROVIDER_SITE_OTHER): Payer: Self-pay | Admitting: Family Medicine

## 2013-01-12 ENCOUNTER — Other Ambulatory Visit (HOSPITAL_COMMUNITY)
Admission: RE | Admit: 2013-01-12 | Discharge: 2013-01-12 | Disposition: A | Discharge status: Home / Self Care | Payer: BC Managed Care – PPO | Source: Ambulatory Visit | Attending: Family Medicine | Admitting: Family Medicine

## 2013-01-12 ENCOUNTER — Encounter: Payer: Self-pay | Admitting: Family Medicine

## 2013-01-12 VITALS — BP 119/76 | HR 66 | Temp 98.1°F | Ht 62.0 in | Wt 157.0 lb

## 2013-01-12 DIAGNOSIS — A499 Bacterial infection, unspecified: Secondary | ICD-10-CM

## 2013-01-12 DIAGNOSIS — N760 Acute vaginitis: Secondary | ICD-10-CM

## 2013-01-12 DIAGNOSIS — B9689 Other specified bacterial agents as the cause of diseases classified elsewhere: Secondary | ICD-10-CM

## 2013-01-12 DIAGNOSIS — R300 Dysuria: Secondary | ICD-10-CM

## 2013-01-12 DIAGNOSIS — Z113 Encounter for screening for infections with a predominantly sexual mode of transmission: Secondary | ICD-10-CM | POA: Insufficient documentation

## 2013-01-12 DIAGNOSIS — N898 Other specified noninflammatory disorders of vagina: Secondary | ICD-10-CM | POA: Insufficient documentation

## 2013-01-12 DIAGNOSIS — N949 Unspecified condition associated with female genital organs and menstrual cycle: Secondary | ICD-10-CM

## 2013-01-12 LAB — POCT URINALYSIS DIPSTICK
Bilirubin, UA: NEGATIVE
Blood, UA: NEGATIVE
Glucose, UA: NEGATIVE
Ketones, UA: NEGATIVE
Leukocytes, UA: NEGATIVE
Nitrite, UA: NEGATIVE
Protein, UA: NEGATIVE
Spec Grav, UA: >=1.03
Urobilinogen, UA: 0.2
pH, UA: 6.5

## 2013-01-12 LAB — POCT WET PREP (WET MOUNT)
Clue Cells Wet Prep Whiff POC: POSITIVE
WBC, Wet Prep HPF POC: >20

## 2013-01-12 MED ORDER — METRONIDAZOLE 500 MG PO TABS: 500.0000 mg | ORAL_TABLET | Freq: Two times a day (BID) | ORAL | Status: DC

## 2013-01-12 NOTE — Progress Notes (Signed)
   Subjective:    Patient ID: Rachel Lloyd, female    DOB: 10/16/1985, 27 y.o.   MRN: [id number]  HPI  CC: bad odor  # Concern for STD - She saw papers stating that her partner was being treated for STD, she isn't sure what one; her partner never told her about being treated.  - Has noticed foul odor starting 1 week ago - Small amount of yellow discharge, "yeast like" - Tingling sensation starting yesterday when she pees, but no burning and no increased frequency - Denies having any pain - Uses condoms on and off, but has not used them recently - has a distant history of chlamydia infection - she is upset by the possibility of her partner being unfaithful since she has not had any other sexual partners  Review of Systems No fevers/chills, nausea/vomiting.     Objective:   Physical Exam BP 119/76  Pulse 66  Temp(Src) 98.1 F (36.7 C) (Oral)  Ht 5\' 2"  (1.575 m)  Wt 157 lb (71.215 kg)  BMI 28.71 kg/m2  General: tearful sitting on exam table GU: Speculum: NAVM, thin yellow-white discharge, strong odor. Bimanual: normal sized ovaries, no tenderness or cervical motion tenderness     Assessment & Plan:  See Problem List documentation

## 2013-01-12 NOTE — Patient Instructions (Signed)
I will call you with the test results later today. If you need to be treated, the medications will be sent to the pharmacy and you can pick them up later today.  I still strongly suggest that you also get tested for HIV and syphilis (blood tests). If and when you would like this done please call the clinic.

## 2013-01-12 NOTE — Assessment & Plan Note (Addendum)
Concern for STD given reported history of partner being treated. Wet prep, GC, UA done, patient declined HIV/RPR. Wet prep shows BV infection, UA is normal. Plan on treating with course of flagyl, once GC results come back add additional treatment as needed.

## 2013-02-04 ENCOUNTER — Encounter: Payer: Self-pay | Admitting: Family Medicine

## 2013-03-23 ENCOUNTER — Ambulatory Visit: Payer: Self-pay | Admitting: Family Medicine

## 2013-03-30 ENCOUNTER — Other Ambulatory Visit (HOSPITAL_COMMUNITY)
Admission: RE | Admit: 2013-03-30 | Discharge: 2013-03-30 | Disposition: A | Discharge status: Home / Self Care | Payer: BC Managed Care – PPO | Source: Ambulatory Visit | Attending: Family Medicine | Admitting: Family Medicine

## 2013-03-30 ENCOUNTER — Ambulatory Visit (INDEPENDENT_AMBULATORY_CARE_PROVIDER_SITE_OTHER): Payer: BC Managed Care – PPO | Admitting: Family Medicine

## 2013-03-30 ENCOUNTER — Encounter: Payer: Self-pay | Admitting: Family Medicine

## 2013-03-30 VITALS — BP 128/87 | HR 77 | Temp 97.5°F | Ht 62.0 in | Wt 146.0 lb

## 2013-03-30 DIAGNOSIS — R300 Dysuria: Secondary | ICD-10-CM

## 2013-03-30 DIAGNOSIS — B354 Tinea corporis: Secondary | ICD-10-CM

## 2013-03-30 DIAGNOSIS — N912 Amenorrhea, unspecified: Secondary | ICD-10-CM

## 2013-03-30 DIAGNOSIS — Z113 Encounter for screening for infections with a predominantly sexual mode of transmission: Secondary | ICD-10-CM | POA: Insufficient documentation

## 2013-03-30 DIAGNOSIS — N898 Other specified noninflammatory disorders of vagina: Secondary | ICD-10-CM

## 2013-03-30 LAB — POCT URINALYSIS DIPSTICK
Blood, UA: NEGATIVE
Glucose, UA: NEGATIVE
Leukocytes, UA: NEGATIVE
Nitrite, UA: NEGATIVE
Protein, UA: 30
Spec Grav, UA: >=1.03
Urobilinogen, UA: 0.2
pH, UA: 5.5

## 2013-03-30 LAB — POCT UA - MICROSCOPIC ONLY

## 2013-03-30 LAB — POCT WET PREP (WET MOUNT): Clue Cells Wet Prep Whiff POC: POSITIVE

## 2013-03-30 LAB — POCT URINE PREGNANCY: Preg Test, Ur: NEGATIVE

## 2013-03-30 MED ORDER — CLOTRIMAZOLE 1 % EX CREA: 1.0000 "application " | TOPICAL_CREAM | Freq: Two times a day (BID) | CUTANEOUS | Status: DC

## 2013-03-30 NOTE — Progress Notes (Signed)
Patient ID: Lisabeth Devoidshley D Cunning    DOB: Mar 27, 1985, 28 y.o.   MRN: [id number] --- Subjective:  Morrie Sheldonshley is a 28 y.o.female who presents for evaluation of vaginal discharge and itching.  - vaginal discharge associated with odor. Started a couple of days ago. Vaginal itching today. No vaginal burning. White discharge present. Had sex with different sexual partner and she is not sure if she might have caught something from him. NO abdominal pain.  She also reports some mild stinging with urination yesterday.   - skin rash: started 2 weeks ago. Started like small dots and became larger patches. Located under her upper right arm. Lower back and lower abdomen. Itchy. No change in soap or detergents. No new meds. No fevers or chills.   ROS: see HPI Past Medical History: reviewed and updated medications and allergies. Social History: Tobacco: former smoker  Objective: Filed Vitals:   03/30/13 1450  BP: 128/87  Pulse: 77  Temp: 97.5 F (36.4 C)    Physical Examination:   General appearance - alert, well appearing, and in no distress Pelvic exam: normal external genitalia, vulva, vagina, cervix, uterus and adnexa, whitish discharge present. Skin - scaly round patches on right arm: 3 patches around 1cm in diameter each, left flank: scaly patch 2cmx0.5cm. 2 on abdomen: 1.2x0.7cm and 1x0.5cm.

## 2013-03-30 NOTE — Patient Instructions (Addendum)
We will call you with the results.   Return for blood work.   If the rash doesn't get better with the medicine, return to clinic.   Body Ringworm Ringworm (tinea corporis) is a fungal infection of the skin on the body. This infection is not caused by worms, but is actually caused by a fungus. Fungus normally lives on the top of your skin and can be useful. However, in the case of ringworms, the fungus grows out of control and causes a skin infection. It can involve any area of skin on the body and can spread easily from one person to another (contagious). Ringworm is a common problem for children, but it can affect adults as well. Ringworm is also often found in athletes, especially wrestlers who share equipment and mats.  CAUSES  Ringworm of the body is caused by a fungus called dermatophyte. It can spread by:  Touchingother people who are infected.  Touchinginfected pets.  Touching or sharingobjects that have been in contact with the infected person or pet (hats, combs, towels, clothing, sports equipment). SYMPTOMS   Itchy, raised red spots and bumps on the skin.  Ring-shaped rash.  Redness near the border of the rash with a clear center.  Dry and scaly skin on or around the rash. Not every person develops a ring-shaped rash. Some develop only the red, scaly patches. DIAGNOSIS  Most often, ringworm can be diagnosed by performing a skin exam. Your caregiver may choose to take a skin scraping from the affected area. The sample will be examined under the microscope to see if the fungus is present.  TREATMENT  Body ringworm may be treated with a topical antifungal cream or ointment. Sometimes, an antifungal shampoo that can be used on your body is prescribed. You may be prescribed antifungal medicines to take by mouth if your ringworm is severe, keeps coming back, or lasts a long time.  HOME CARE INSTRUCTIONS   Only take over-the-counter or prescription medicines as directed by your  caregiver.  Wash the infected area and dry it completely before applying yourcream or ointment.  When using antifungal shampoo to treat the ringworm, leave the shampoo on the body for 3 5 minutes before rinsing.   Wear loose clothing to stop clothes from rubbing and irritating the rash.  Wash or change your bed sheets every night while you have the rash.  Have your pet treated by your veterinarian if it has the same infection. To prevent ringworm:   Practice good hygiene.  Wear sandals or shoes in public places and showers.  Do not share personal items with others.  Avoid touching red patches of skin on other people.  Avoid touching pets that have bald spots or wash your hands after doing so. SEEK MEDICAL CARE IF:   Your rash continues to spread after 7 days of treatment.  Your rash is not gone in 4 weeks.  The area around your rash becomes red, warm, tender, and swollen. Document Released: 12/28/1999 Document Revised: 09/24/2011 Document Reviewed: 07/14/2011 [facility] Patient Information 2014 [place], [place].

## 2013-03-31 ENCOUNTER — Telehealth: Payer: Self-pay | Admitting: Family Medicine

## 2013-03-31 DIAGNOSIS — B354 Tinea corporis: Secondary | ICD-10-CM | POA: Insufficient documentation

## 2013-03-31 LAB — CERVICOVAGINAL ANCILLARY ONLY
Chlamydia: NEGATIVE
Neisseria Gonorrhea: NEGATIVE

## 2013-03-31 MED ORDER — METRONIDAZOLE 500 MG PO TABS: 500.0000 mg | ORAL_TABLET | Freq: Two times a day (BID) | ORAL | Status: DC

## 2013-03-31 NOTE — Assessment & Plan Note (Signed)
Vaginal discharge and odor more consistent with BV.  - check wet prep - check GC/Chl - strongly recommended that patient get HIV and RPR which she was on board getting but when she realized this was going to involve a blood draw, she decided to postpone to another time. Future order for HIV/RPR entered.

## 2013-03-31 NOTE — Assessment & Plan Note (Signed)
Rash consitent in pattern to tinea corporis. Did not scrape sample since I would have treated skin the same even if sample had been negative, given high clinical suspicion for tinea.  - Rx for topical clotrimazole. If not better, consider scraping at that point to better define diagnosis.

## 2013-03-31 NOTE — Telephone Encounter (Signed)
Patient states she received a call from our office after hours yesterday, no notes. Please call patient.

## 2013-03-31 NOTE — Telephone Encounter (Signed)
Attempted to call patient again but no answer. Sent flagyl to pharmacy. Will try calling again.   Marena ChancyStephanie Zyra Parrillo, PGY-3 Family Medicine Resident

## 2013-03-31 NOTE — Telephone Encounter (Signed)
Tried calling patient yesterday and today to let her know of the results. Voicemail box not set up yet.  If patient calls back, please let her know that she has a bacterial vaginosis and that I am sending flagyl antibiotics to take for one week.  The rest of her labs were normal, including GC/Chl and urine.   Thank you!  Marena ChancyStephanie Allesandra Huebsch, PGY-3 Family Medicine Resident

## 2013-03-31 NOTE — Telephone Encounter (Signed)
Will fwd to Dr.Losq .Arlyss RepressSlade, Kejuan Bekker

## 2013-03-31 NOTE — Telephone Encounter (Signed)
Attempted phone call.  No answer and no VM set up.   Rachel Lloyd, Darlyne RussianKristen L, CMA

## 2013-04-01 NOTE — Telephone Encounter (Signed)
Called patient and informed her of results.   Rachel ChancyStephanie Tolulope Lloyd, PGY-3 Family Medicine Resident

## 2013-06-09 ENCOUNTER — Telehealth: Payer: Self-pay | Admitting: *Deleted

## 2013-06-09 NOTE — Telephone Encounter (Signed)
Pt called stating she has a "constant pain behind her belly button and discharge".  Pt stated the discharge is white with an odor.  Appt scheduled for tomorrow 06/10/2013.  Pt advised to keep appt and take Ibuprofen with food to help with pain until appt. Pt stated also stated she was recently seen in clinic and treated for bacterial vaginosis.  Pt educated about bacterial vaginosis.  Not to douche, try to not to use perfume soaps or lotion around vaginal area,  try voiding before and after sex and limiting sex partners.  Pt stated understanding.  Clovis Pu, RN

## 2013-06-10 ENCOUNTER — Encounter: Payer: Self-pay | Admitting: Family Medicine

## 2013-06-10 ENCOUNTER — Ambulatory Visit (INDEPENDENT_AMBULATORY_CARE_PROVIDER_SITE_OTHER): Payer: BC Managed Care – PPO | Admitting: Family Medicine

## 2013-06-10 ENCOUNTER — Other Ambulatory Visit (HOSPITAL_COMMUNITY)
Admission: RE | Admit: 2013-06-10 | Discharge: 2013-06-10 | Disposition: A | Discharge status: Home / Self Care | Payer: BC Managed Care – PPO | Source: Ambulatory Visit | Attending: Family Medicine | Admitting: Family Medicine

## 2013-06-10 VITALS — BP 112/70 | HR 101 | Temp 98.5°F | Ht 62.0 in | Wt 145.9 lb

## 2013-06-10 DIAGNOSIS — Z113 Encounter for screening for infections with a predominantly sexual mode of transmission: Secondary | ICD-10-CM | POA: Insufficient documentation

## 2013-06-10 DIAGNOSIS — Z3480 Encounter for supervision of other normal pregnancy, unspecified trimester: Secondary | ICD-10-CM

## 2013-06-10 DIAGNOSIS — N898 Other specified noninflammatory disorders of vagina: Secondary | ICD-10-CM

## 2013-06-10 DIAGNOSIS — I10 Essential (primary) hypertension: Secondary | ICD-10-CM

## 2013-06-10 DIAGNOSIS — R109 Unspecified abdominal pain: Secondary | ICD-10-CM

## 2013-06-10 LAB — POCT URINALYSIS DIPSTICK
Bilirubin, UA: NEGATIVE
Blood, UA: NEGATIVE
Glucose, UA: NEGATIVE
Ketones, UA: NEGATIVE
Nitrite, UA: NEGATIVE
Spec Grav, UA: 1.025
Urobilinogen, UA: 0.2
pH, UA: 7

## 2013-06-10 LAB — POCT WET PREP (WET MOUNT): Clue Cells Wet Prep Whiff POC: NEGATIVE

## 2013-06-10 LAB — POCT UA - MICROSCOPIC ONLY

## 2013-06-10 LAB — POCT URINE PREGNANCY: Preg Test, Ur: POSITIVE

## 2013-06-10 MED ORDER — NITROFURANTOIN MONOHYD MACRO 100 MG PO CAPS: 100.0000 mg | ORAL_CAPSULE | Freq: Two times a day (BID) | ORAL | Status: DC

## 2013-06-10 MED ORDER — ONDANSETRON HCL 4 MG PO TABS: 4.0000 mg | ORAL_TABLET | Freq: Three times a day (TID) | ORAL | Status: DC | PRN

## 2013-06-10 MED ORDER — RANITIDINE HCL 150 MG PO TABS: 150.0000 mg | ORAL_TABLET | Freq: Two times a day (BID) | ORAL | Status: DC

## 2013-06-10 NOTE — Assessment & Plan Note (Addendum)
Periumbilical discomfort possibly from increased uterine pressure. No reproducible symptoms on exam.  Given description of nausea with eating, could be reflux - trial of ranitidine - zofran for nausea in acute setting although pryridoxine and doxylamine may be better option if nausea and vomiting persist.  - UA checked and showing some leuks, no nitrites. Patient asymptomatic, but given pregnancy, sent to culture. Will empirically treat with nitrofurantoin: keflex not possible since patient has anaphalaxis to penicillins. By estimated LMP, patient should be in 2nd trimester making nitrofurantoin safe.  - return to care if not better

## 2013-06-10 NOTE — Assessment & Plan Note (Addendum)
Wet prep obtained and negative.  F/u GC/Chl

## 2013-06-10 NOTE — Assessment & Plan Note (Addendum)
Patient diagnosed with pregnancy today which could be explanation of the feeling of abdominal pressure and discomfort she is describing.  Now [id number]. Dating unclear, but approximately 15 weeks given LMP around February 14th.  - checked GC/Chl, wet prep - will get OB ultrasound to confirm viability and dating  - will also schedule patient for initial OB appointment and for prenatal labs prior to appointment.

## 2013-06-10 NOTE — Progress Notes (Signed)
Patient ID: [name]    DOB: 1985-10-01, 28 y.o.   MRN: [id number] --- Subjective:  Rachel Lloyd is a 28 y.o.female who presents with concern of abdominal pain and vaginal discharge.  Patient states that she has been having mid umbilical discomfort for 2 weeks, non worsening symptoms. Pain is a nauseous like discomfort. Doesn't radiate. Worst with eating. This morning she had an episode of non bloody emesis. She has nausea.  Vaginal discharge has been present for 2 weeks. White. Initially itchy but she used monistat and itchiness resolved. Persistent discharge.  She also reports discomfort of the left groin with movement or lifting. This has been present since the birth of her daughter in July 2014.    LMP: sometime in February. Patient not on birth control  ROS: see HPI Past Medical History: reviewed and updated medications and allergies. Social History: Tobacco: former smoker  Objective: Filed Vitals:   06/10/13 1051  BP: 112/70  Pulse: 101  Temp: 98.5 F (36.9 C)    Physical Examination:   General appearance - alert, well appearing, and in no distress Mouth - mucous membranes moist, pharynx normal without lesions Chest - clear to auscultation, no wheezes, rales or rhonchi, symmetric air entry Heart - normal rate, regular rhythm, normal S1, S2, no murmurs Abdomen - no significant tenderness to palpation, present bowel sounds, no rebound, no guarding, negative Murphy's, no CVA tenderness Pelvic exam: normal external genitalia, vulva, vagina, cervix, uterus enlarged, whitish thin discharge present   UPT: positive

## 2013-06-10 NOTE — Patient Instructions (Signed)
We are going to get an ultrasound to get better dating.   I will call you with the results of the wet prep.

## 2013-06-11 LAB — HIV ANTIBODY (ROUTINE TESTING W REFLEX): HIV 1&2 Ab, 4th Generation: NONREACTIVE

## 2013-06-12 LAB — URINE CULTURE: Colony Count: 15000

## 2013-06-13 ENCOUNTER — Telehealth: Payer: Self-pay | Admitting: Family Medicine

## 2013-06-13 NOTE — Telephone Encounter (Signed)
Called patient to let her know of the negative urine culture. She had not been taking nitrofurantoin. She has been taking zofran and zantac.  She states that she started having nasal congestion and rhinorrhea. No fever but headache behind her eyes. Recommended that she take tylenol. If not better though, recommended that she be seen in clinic. She agress and expressed understanding.   Marena Chancy, PGY-3 Family Medicine Resident

## 2013-06-15 ENCOUNTER — Telehealth: Payer: Self-pay | Admitting: Family Medicine

## 2013-06-15 NOTE — Telephone Encounter (Signed)
Left message for pt to call back for results. Per Dr. Gwenlyn Saran, GC/CHL was normal. MS

## 2013-06-15 NOTE — Telephone Encounter (Signed)
Please let patient know that the GC/Chl testing was normal. Thank you!  Marena Chancy, PGY-3 Family Medicine Resident

## 2013-06-15 NOTE — Telephone Encounter (Signed)
Patient informed. 

## 2013-06-16 ENCOUNTER — Ambulatory Visit (HOSPITAL_COMMUNITY): Payer: BC Managed Care – PPO | Attending: Family Medicine

## 2013-06-22 ENCOUNTER — Inpatient Hospital Stay (HOSPITAL_COMMUNITY): Payer: BC Managed Care – PPO

## 2013-06-22 ENCOUNTER — Inpatient Hospital Stay (HOSPITAL_COMMUNITY)
Admission: AD | Admit: 2013-06-22 | Discharge: 2013-06-22 | Disposition: A | Discharge status: Home / Self Care | Payer: BC Managed Care – PPO | Source: Ambulatory Visit | Attending: Obstetrics & Gynecology | Admitting: Obstetrics & Gynecology

## 2013-06-22 ENCOUNTER — Encounter (HOSPITAL_COMMUNITY): Payer: Self-pay

## 2013-06-22 DIAGNOSIS — O210 Mild hyperemesis gravidarum: Secondary | ICD-10-CM | POA: Insufficient documentation

## 2013-06-22 DIAGNOSIS — O9989 Other specified diseases and conditions complicating pregnancy, childbirth and the puerperium: Secondary | ICD-10-CM

## 2013-06-22 DIAGNOSIS — R51 Headache: Secondary | ICD-10-CM | POA: Insufficient documentation

## 2013-06-22 DIAGNOSIS — M549 Dorsalgia, unspecified: Secondary | ICD-10-CM | POA: Insufficient documentation

## 2013-06-22 DIAGNOSIS — O99891 Other specified diseases and conditions complicating pregnancy: Secondary | ICD-10-CM | POA: Insufficient documentation

## 2013-06-22 DIAGNOSIS — R109 Unspecified abdominal pain: Secondary | ICD-10-CM

## 2013-06-22 DIAGNOSIS — Z87891 Personal history of nicotine dependence: Secondary | ICD-10-CM | POA: Insufficient documentation

## 2013-06-22 DIAGNOSIS — O26899 Other specified pregnancy related conditions, unspecified trimester: Secondary | ICD-10-CM

## 2013-06-22 HISTORY — DX: Chlamydial infection, unspecified: A74.9

## 2013-06-22 HISTORY — DX: Pneumonia, unspecified organism: J18.9

## 2013-06-22 LAB — URINALYSIS, ROUTINE W REFLEX MICROSCOPIC
Bilirubin Urine: NEGATIVE
Glucose, UA: NEGATIVE mg/dL
Hgb urine dipstick: NEGATIVE
Ketones, ur: NEGATIVE mg/dL
Leukocytes, UA: NEGATIVE
Nitrite: NEGATIVE
Protein, ur: NEGATIVE mg/dL
Specific Gravity, Urine: 1.02 (ref 1.005–1.030)
Urobilinogen, UA: 0.2 mg/dL (ref 0.0–1.0)
pH: 7.5 (ref 5.0–8.0)

## 2013-06-22 LAB — CBC
HCT: 35.4 % — ABNORMAL LOW (ref 36.0–46.0)
Hemoglobin: 12 g/dL (ref 12.0–15.0)
MCH: 30.6 pg (ref 26.0–34.0)
MCHC: 33.9 g/dL (ref 30.0–36.0)
MCV: 90.3 fL (ref 78.0–100.0)
Platelets: 251 10*3/uL (ref 150–400)
RBC: 3.92 MIL/uL (ref 3.87–5.11)
RDW: 12.8 % (ref 11.5–15.5)
WBC: 8.6 10*3/uL (ref 4.0–10.5)

## 2013-06-22 LAB — HCG, QUANTITATIVE, PREGNANCY: hCG, Beta Chain, Quant, S: 111238 m[IU]/mL — ABNORMAL HIGH (ref <5)

## 2013-06-22 MED ORDER — PROMETHAZINE HCL 12.5 MG PO TABS: 12.5000 mg | ORAL_TABLET | Freq: Four times a day (QID) | ORAL | Status: DC | PRN

## 2013-06-22 NOTE — Discharge Instructions (Signed)
Pregnancy, First Trimester °The first trimester is the first 3 months your baby is growing inside you. It is important to follow your doctor's instructions. °HOME CARE  °· Do not smoke. °· Do not drink alcohol. °· Only take medicine as told by your doctor. °· Exercise. °· Eat healthy foods. Eat regular, well-balanced meals. °· You can have sex (intercourse) if there are no other problems with the pregnancy. °· Things that help with morning sickness: °· Eat soda crackers before getting up in the morning. °· Eat 4 to 5 small meals rather than 3 large meals. °· Drink liquids between meals, not during meals. °· Go to all appointments as told. °· Take all vitamins or supplements as told by your doctor. °GET HELP RIGHT AWAY IF:  °· You develop a fever. °· You have a bad smelling fluid that is leaking from your vagina. °· There is bleeding from the vagina. °· You develop severe belly (abdominal) or back pain. °· You throw up (vomit) blood. It may look like coffee grounds. °· You lose more than 2 pounds in a week. °· You gain 5 pounds or more in a week. °· You gain more than 2 pounds in a week and you see puffiness (swelling) in your feet, ankles, or legs. °· You have severe dizziness or pass out (faint). °· You are around people who have German measles, chickenpox, or fifth disease. °· You have a headache, watery poop (diarrhea), pain with peeing (urinating), or cannot breath right. °Document Released: 06/18/2007 Document Revised: 03/24/2011 Document Reviewed: 06/18/2007 °ExitCare® Patient Information ©2014 [employer] LLC. ° °

## 2013-06-22 NOTE — MAU Provider Note (Signed)
History     CSN: [id number]  Arrival date and time: 06/22/13 9390   First Provider Initiated Contact with Patient 06/22/13 [phone]      Chief Complaint  Patient presents with  . Abdominal Pain  . Back Pain   HPI Rachel Lloyd 28 y.o. [id number] presents with headache, lower back pain, and midabdominal pain rated 7/10, ongoing for 2 weeks. + UPT on 5/29 at [facility], patient unsure of LMP but believes it to be February 14th. Has been having nausea and vomiting. Taking Tylenol, zofran and Zantac without relief. Had some minimal spotting this morning but is gone now. Reports white, thin vaginal disharge without itching. Had gone previously to [facility] on 5/29 and told all results from discharge were normal. States she has been feeling hot, but has not taken temperature. Denies urinary frequency and dysuria.   OB History   Grav Para Term Preterm Abortions TAB SAB Ect Mult Living   5 3 3  0 1 1 0 0 0 3      Past Medical History  Diagnosis Date  . Mental disorder 2010    bipolar  . Scabies exposure 2012    treated july 2012 & reexposed  . Hypertension     During second pregnancy  . Pneumonia   . Chlamydia     Past Surgical History  Procedure Laterality Date  . Mouth surgery      as a child  . Wisdom tooth extraction    . Induced abortion      Family History  Problem Relation Age of Onset  . Anesthesia problems Neg Hx   . Hypertension Mother   . Cancer Mother   . Fibroids Mother   . ADD / ADHD Brother   . Hypertension Maternal Grandmother     History  Substance Use Topics  . Smoking status: Former Smoker    Types: Cigarettes    Quit date: 04/04/2010  . Smokeless tobacco: Never Used  . Alcohol Use: No     Comment: quit when found out was pregnant    Allergies:  Allergies  Allergen Reactions  . Penicillins Anaphylaxis  . Latex Itching and Rash    Prescriptions prior to admission  Medication Sig Dispense Refill  . nitrofurantoin,  macrocrystal-monohydrate, (MACROBID) 100 MG capsule Take 1 capsule (100 mg total) by mouth 2 (two) times daily. Take for 5 days  10 capsule  0  . ondansetron (ZOFRAN) 4 MG tablet Take 1 tablet (4 mg total) by mouth every 8 (eight) hours as needed for nausea or vomiting.  20 tablet  0  . ranitidine (ZANTAC) 150 MG tablet Take 1 tablet (150 mg total) by mouth 2 (two) times daily.  60 tablet  0    Review of Systems  Constitutional: Positive for malaise/fatigue. Negative for fever and chills.  Eyes: Negative.   Respiratory: Negative.   Cardiovascular: Negative.   Genitourinary: Negative for urgency.  Musculoskeletal: Positive for back pain.  Neurological: Positive for headaches.  Psychiatric/Behavioral: Negative.    Physical Exam   Blood pressure 118/76, pulse 94, temperature 99.4 F (37.4 C), temperature source Oral, resp. rate 16, height 5' (1.524 m), weight 67.314 kg (148 lb 6.4 oz), SpO2 98.00%.  Physical Exam  Nursing note and vitals reviewed. Constitutional: She is oriented to person, place, and time. She appears well-developed and well-nourished.  HENT:  Head: Normocephalic and atraumatic.  Neck: Normal range of motion.  Cardiovascular: Normal rate and regular rhythm.   Respiratory: Effort normal.  GI: Soft. Bowel sounds are normal. She exhibits no distension and no mass. There is tenderness (very slight tenderness to palpation in umbilical area). There is no rebound and no guarding.  Genitourinary:  External and Vagina: Small amount of white discharge present. No blood noted. Cervix: 1 cm fluid-filled sac noted Bimanual: Firm sac palpated.  Musculoskeletal: Normal range of motion.  Neurological: She is alert and oriented to person, place, and time.  Skin: Skin is warm and dry.  Psychiatric: She has a normal mood and affect. Her behavior is normal. Judgment and thought content normal.   No FHT by Doppler Results for orders placed during the hospital encounter of 06/22/13  (from the past 48 hour(s))  URINALYSIS, ROUTINE W REFLEX MICROSCOPIC     Status: None   Collection Time    06/22/13  8:51 AM      Result Value Ref Range   Color, Urine YELLOW  YELLOW   APPearance CLEAR  CLEAR   Specific Gravity, Urine 1.020  1.005 - 1.030   pH 7.5  5.0 - 8.0   Glucose, UA NEGATIVE  NEGATIVE mg/dL   Hgb urine dipstick NEGATIVE  NEGATIVE   Bilirubin Urine NEGATIVE  NEGATIVE   Ketones, ur NEGATIVE  NEGATIVE mg/dL   Protein, ur NEGATIVE  NEGATIVE mg/dL   Urobilinogen, UA 0.2  0.0 - 1.0 mg/dL   Nitrite NEGATIVE  NEGATIVE   Leukocytes, UA NEGATIVE  NEGATIVE   Comment: MICROSCOPIC NOT DONE ON URINES WITH NEGATIVE PROTEIN, BLOOD, LEUKOCYTES, NITRITE, OR GLUCOSE <1000 mg/dL.  CBC     Status: Abnormal   Collection Time    06/22/13  9:04 AM      Result Value Ref Range   WBC 8.6  4.0 - 10.5 K/uL   RBC 3.92  3.87 - 5.11 MIL/uL   Hemoglobin 12.0  12.0 - 15.0 g/dL   HCT 16.135.4 (*) 09.636.0 - 04.546.0 %   MCV 90.3  78.0 - 100.0 fL   MCH 30.6  26.0 - 34.0 pg   MCHC 33.9  30.0 - 36.0 g/dL   RDW 40.912.8  81.111.5 - 91.415.5 %   Platelets 251  150 - 400 K/uL  HCG, QUANTITATIVE, PREGNANCY     Status: Abnormal   Collection Time    06/22/13  9:04 AM      Result Value Ref Range   hCG, Beta Chain, Sharene ButtersQuant, S [id number] (*) <5 mIU/mL   Comment:              GEST. AGE      CONC.  (mIU/mL)       <=1 WEEK        5 - 50         2 WEEKS       50 - 500         3 WEEKS       100 - 10,000         4 WEEKS     1,000 - 30,000         5 WEEKS     3,500 - 115,000       6-8 WEEKS     12,000 - 270,000        12 WEEKS     15,000 - 220,000                FEMALE AND NON-PREGNANT FEMALE:         LESS THAN 5 mIU/mL    [place] Ob Comp Less 14  Wks  06/22/2013   CLINICAL DATA:  Lower abdominal pain, pregnant ; no quantitative beta HCG available at this time for correlation ; irregular cycles  EXAM: OBSTETRIC <14 WK ULTRASOUND  TECHNIQUE: Transabdominal ultrasound was performed for evaluation of the gestation as well as the  maternal uterus and adnexal regions.  COMPARISON:  None for this gestational  FINDINGS: Intrauterine gestational sac: Visualized/normal in shape.  Yolk sac:  Present  Embryo:  Present  Cardiac Activity: Present  Heart Rate: 153 bpm  CRL:   12.7  mm   7 w 4 d                  [place] EDC: 02/04/2014  Maternal uterus/adnexae:  No subchorionic hemorrhage.  RIGHT ovary normal size and morphology, 2.4 x 3.3 x 1.7 cm.  LEFT ovary normal size and morphology, 3.9 x 2.5 x 2.4 cm containing a small corpus luteum.  No adnexal masses or free pelvic fluid.  IMPRESSION: Single live early intrauterine gestational measured at 7 weeks 4 days EGA by crown-rump length.  No acute abnormalities.   Electronically Signed   By: Ulyses Southward M.D.   On: 06/22/2013 09:47   MAU Course  Procedures  MDM CBC, Quant hCG, US OB  Assessment and Plan  A: SIUP Pregnancy at [redacted]w[redacted]d ? Cervical Polyp    P: Precautions reviewed with patient: Return to MAU for severe pain, bleeding, nausea/vomiting or fever Continue Tylenol and Zantac as needed Start patient on phenergan 12.5 to 25 mg q6h as needed for nausea and vomiting Establish initial prenatal appointment with [facility]     Pecola Lawless, Lauren L 06/22/2013, 8:48 AM   I have seen and evaluated the patient with the NP student. I agree with the assessment and plan as written above.   Freddi Starr, PA-C 06/22/2013 2:31 PM

## 2013-06-22 NOTE — MAU Note (Signed)
Patient states she has been having mid abdominal, right side and back pain since last night. Had spotting this am but none now, no discharge. Has nausea with occasional vomiting.

## 2013-07-14 ENCOUNTER — Ambulatory Visit (HOSPITAL_COMMUNITY): Payer: BC Managed Care – PPO

## 2013-08-05 ENCOUNTER — Ambulatory Visit (INDEPENDENT_AMBULATORY_CARE_PROVIDER_SITE_OTHER): Payer: Self-pay | Admitting: Family Medicine

## 2013-08-05 ENCOUNTER — Encounter: Payer: Self-pay | Admitting: Family Medicine

## 2013-08-05 VITALS — BP 112/76 | HR 76 | Temp 98.1°F | Ht 62.0 in | Wt 145.8 lb

## 2013-08-05 DIAGNOSIS — Z9889 Other specified postprocedural states: Secondary | ICD-10-CM | POA: Insufficient documentation

## 2013-08-05 DIAGNOSIS — A499 Bacterial infection, unspecified: Secondary | ICD-10-CM

## 2013-08-05 DIAGNOSIS — B3731 Acute candidiasis of vulva and vagina: Secondary | ICD-10-CM

## 2013-08-05 DIAGNOSIS — N760 Acute vaginitis: Secondary | ICD-10-CM

## 2013-08-05 DIAGNOSIS — Z8742 Personal history of other diseases of the female genital tract: Secondary | ICD-10-CM

## 2013-08-05 DIAGNOSIS — B9689 Other specified bacterial agents as the cause of diseases classified elsewhere: Secondary | ICD-10-CM

## 2013-08-05 DIAGNOSIS — B373 Candidiasis of vulva and vagina: Secondary | ICD-10-CM

## 2013-08-05 MED ORDER — FLUCONAZOLE 150 MG PO TABS: 150.0000 mg | ORAL_TABLET | Freq: Once | ORAL | Status: DC

## 2013-08-05 MED ORDER — METRONIDAZOLE 500 MG PO TABS: 500.0000 mg | ORAL_TABLET | Freq: Three times a day (TID) | ORAL | Status: DC

## 2013-08-05 NOTE — Progress Notes (Signed)
   Subjective:    Patient ID: Rachel Lloyd, female    DOB: May 19, 1985, 28 y.o.   MRN: [id number]  HPI: Pt presents to clinic for SDA for concern for vaginal infection. She had an abortion on 13 June and has had vaginal discharge (thin, light, whitish, without blood) with odor. She has had no belly pain, fever, nausea or vomiting. She has no urinary symptoms. She has had a menstrual cycle in the past few weeks (around July 15). She is sexually active since the abortion, and her last intercourse was two days ago (protected, with no new partner). Otherwise, she feels okay.  Review of Systems: As above.     Objective:   Physical Exam BP 112/76  Pulse 76  Temp(Src) 98.1 F (36.7 C) (Oral)  Ht 5\' 2"  (1.575 m)  Wt 145 lb 12.8 oz (66.134 kg)  BMI 26.66 kg/m2  LMP 06/27/2013  Breastfeeding? Unknown Gen: well-appearing adult female in NAD Cardio: RRR, no murmur appreciated Pulm: CTAB, no wheezes Abd: soft, nontender, BS+ Ext: warm, well-perfused, no LE edema GU: pt declined exam     Assessment & Plan:  28yo s/p recent elective abortion, with vaginal discharge consistent by history alone with yeast +/- BV - pt DECLINED pelvic exam; prefers female providers and per history symptoms are mild / self-limited - pt otherwise well with stable vital signs, no systemic symptoms / signs of infection, and otherwise young / healthy  Plan: empiric Diflucan and metronidazole for yeast / BV - strongly advised close f/u with PCP (Dr. Leonides Schanzorsey; pt previously saw Dr. Gwenlyn SaranLosq) - discussed red flags at length, including worse discharge, pain / fever, vomiting, etc, that would prompt immediate return to care - f/u in 2 weeks to meet new PCP, or sooner if needed  Bobbye Mortonhristopher M Street, MD PGY-3, [facility] 08/05/2013, 12:13 PM

## 2013-08-05 NOTE — Patient Instructions (Signed)
Thank you for coming in, today!  I think your symptoms could be yeast or BV, like you said. I think it's okay to treat for these and then do a full pelvic exam and infection check if things don't improve.  You should take Diflucan (fluconazole) 150 mg, one pill one time to treat for any yeast. You should take Flagyl (metronidazole) 500 mg, one pill three times a day for 7 days, to treat for BV.  Come back in about two weeks to see Dr. Leonides Schanzorsey (your new doctor, now that Dr. Gwenlyn SaranLosq is gone). If you have worse pain or discharge, fever, vomiting, or unusual bleeding, call the clinic or go to the emergency room. Please feel free to call with any questions or concerns at any time, at [phone]. --Dr. Casper HarrisonStreet

## 2013-08-18 ENCOUNTER — Telehealth: Payer: Self-pay

## 2013-08-18 MED ORDER — METRONIDAZOLE 500 MG PO TABS: 500.0000 mg | ORAL_TABLET | Freq: Three times a day (TID) | ORAL | Status: DC

## 2013-08-18 MED ORDER — FLUCONAZOLE 150 MG PO TABS: 150.0000 mg | ORAL_TABLET | Freq: Once | ORAL | Status: DC
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
## 2013-08-18 NOTE — Telephone Encounter (Signed)
Rx's were for Diflucan and metronidazole, so feel comfortable re-sending these, but pt will have to come back in to be re-evaluated if she has further issues or needs new Rx's. [facility] [address] Thanks. --CMS

## 2013-10-12 ENCOUNTER — Ambulatory Visit (INDEPENDENT_AMBULATORY_CARE_PROVIDER_SITE_OTHER): Payer: Self-pay | Admitting: Family Medicine

## 2013-10-12 ENCOUNTER — Encounter: Payer: Self-pay | Admitting: Family Medicine

## 2013-10-12 VITALS — BP 113/61 | HR 95 | Temp 98.1°F | Wt 139.7 lb

## 2013-10-12 DIAGNOSIS — R109 Unspecified abdominal pain: Secondary | ICD-10-CM

## 2013-10-12 DIAGNOSIS — B309 Viral conjunctivitis, unspecified: Secondary | ICD-10-CM | POA: Insufficient documentation

## 2013-10-12 LAB — POCT UA - MICROSCOPIC ONLY

## 2013-10-12 LAB — POCT URINALYSIS DIPSTICK
Bilirubin, UA: NEGATIVE
Blood, UA: NEGATIVE
Glucose, UA: NEGATIVE
Ketones, UA: NEGATIVE
Nitrite, UA: NEGATIVE
Protein, UA: NEGATIVE
Spec Grav, UA: 1.02
Urobilinogen, UA: 0.2
pH, UA: 7.5

## 2013-10-12 LAB — POCT URINE PREGNANCY: Preg Test, Ur: NEGATIVE

## 2013-10-12 MED ORDER — FLUCONAZOLE 150 MG PO TABS
150.0000 mg | ORAL_TABLET | Freq: Once | ORAL | Status: DC
Start: 2013-10-12 — End: 2014-04-01

## 2013-10-12 MED ORDER — TETRAHYDROZOLINE HCL 0.05 % OP SOLN: 1.0000 [drp] | Freq: Two times a day (BID) | OPHTHALMIC | Status: DC

## 2013-10-12 NOTE — Patient Instructions (Signed)
Viral Conjunctivitis Conjunctivitis is an irritation (inflammation) of the clear membrane that covers the white part of the eye (the conjunctiva). The irritation can also happen on the underside of the eyelids. Conjunctivitis makes the eye red or pink in color. This is what is commonly known as pink eye. Viral conjunctivitis can spread easily (contagious). CAUSES   Infection from virus on the surface of the eye.  Infection from the irritation or injury of nearby tissues such as the eyelids or cornea.  More serious inflammation or infection on the inside of the eye.  Other eye diseases.  The use of certain eye medications. SYMPTOMS  The normally white color of the eye or the underside of the eyelid is usually pink or red in color. The pink eye is usually associated with irritation, tearing and some sensitivity to light. Viral conjunctivitis is often associated with a clear, watery discharge. If a discharge is present, there may also be some blurred vision in the affected eye. DIAGNOSIS  Conjunctivitis is diagnosed by an eye exam. The eye specialist looks for changes in the surface tissues of the eye which take on changes characteristic of the specific types of conjunctivitis. A sample of any discharge may be collected on a Q-Tip (sterile swap). The sample will be sent to a lab to see whether or not the inflammation is caused by bacterial or viral infection. TREATMENT  Viral conjunctivitis will not respond to medicines that kill germs (antibiotics). Treatment is aimed at stopping a bacterial infection on top of the viral infection. The goal of treatment is to relieve symptoms (such as itching) with antihistamine drops or other eye medications.  HOME CARE INSTRUCTIONS   To ease discomfort, apply a cool, clean wash cloth to your eye for 10 to 20 minutes, 3 to 4 times a day.  Gently wipe away any drainage from the eye with a warm, wet washcloth or a cotton ball.  Wash your hands often with soap  and use paper towels to dry.  Do not share towels or washcloths. This may spread the infection.  Change or wash your pillowcase every day.  You should not use eye make-up until the infection is gone.  Stop using contacts lenses. Ask your eye professional how to sterilize or replace them before using again. This depends on the type of contact lenses used.  Do not touch the edge of the eyelid with the eye drop bottle or ointment tube when applying medications to the affected eye. This will stop you from spreading the infection to the other eye or to others. SEEK IMMEDIATE MEDICAL CARE IF:   The infection has not improved within 3 days of beginning treatment.  A watery discharge from the eye develops.  Pain in the eye increases.  The redness is spreading.  Vision becomes blurred.  An oral temperature above 102 F (38.9 C) develops, or as your caregiver suggests.  Facial pain, redness or swelling develops.  Any problems that may be related to the prescribed medicine develop. MAKE SURE YOU:   Understand these instructions.  Will watch your condition.  Will get help right away if you are not doing well or get worse. Document Released: 12/30/2004 Document Revised: 03/24/2011 Document Reviewed: 08/19/2007 ExitCare Patient Information 2015 [employer]. This information is not intended to replace advice given to you by your health care provider. Make sure you discuss any questions you have with your health care provider.  

## 2013-10-12 NOTE — Assessment & Plan Note (Signed)
Does not appear bacterial. Will recommend eye drops. Red flags reviewed.

## 2013-10-12 NOTE — Progress Notes (Signed)
    Subjective   Rachel Lloyd is a 28 y.o. female that presents for a same day visit  1. Red eye: Symptoms started 4 days ago. Left contact lens in her eye overnight. Took entire contact out the next day but eye was red. Redness has worsened. Has associated pain. Vision is blurry. No fevers, chills, nausea or vomiting. Has a sick contact with similar symptoms. Her boyfriend developed symptoms today.  History  Substance Use Topics  . Smoking status: Former Smoker    Types: Cigarettes    Quit date: 04/04/2010  . Smokeless tobacco: Never Used  . Alcohol Use: No     Comment: quit when found out was pregnant    ROS Per HPI  Objective   BP 113/61  Pulse 95  Temp(Src) 98.1 F (36.7 C) (Oral)  Wt 139 lb 11.2 oz (63.368 kg)  General: Well appearing in no distress HEENT: Left eye with conjunctivitis. PERRLA. EOMI. No discharge. Fluorescein stain revealed no abnormalities  Vision: 20/20 bilaterally  Assessment and Plan   Please refer to problem based charting of assessment and plan

## 2013-11-14 ENCOUNTER — Encounter: Payer: Self-pay | Admitting: Family Medicine

## 2014-01-04 ENCOUNTER — Emergency Department (INDEPENDENT_AMBULATORY_CARE_PROVIDER_SITE_OTHER)
Admission: EM | Admit: 2014-01-04 | Discharge: 2014-01-04 | Disposition: A | Discharge status: Home / Self Care | Payer: Self-pay | Source: Home / Self Care | Attending: Emergency Medicine | Admitting: Emergency Medicine

## 2014-01-04 ENCOUNTER — Other Ambulatory Visit (HOSPITAL_COMMUNITY)
Admission: RE | Admit: 2014-01-04 | Discharge: 2014-01-04 | Disposition: A | Discharge status: Home / Self Care | Payer: Self-pay | Source: Ambulatory Visit | Attending: Emergency Medicine | Admitting: Emergency Medicine

## 2014-01-04 ENCOUNTER — Encounter (HOSPITAL_COMMUNITY): Payer: Self-pay | Admitting: *Deleted

## 2014-01-04 DIAGNOSIS — Z113 Encounter for screening for infections with a predominantly sexual mode of transmission: Secondary | ICD-10-CM | POA: Insufficient documentation

## 2014-01-04 DIAGNOSIS — N760 Acute vaginitis: Secondary | ICD-10-CM

## 2014-01-04 LAB — POCT URINALYSIS DIP (DEVICE)
Bilirubin Urine: NEGATIVE
Glucose, UA: NEGATIVE mg/dL
Ketones, ur: NEGATIVE mg/dL
Nitrite: NEGATIVE
Protein, ur: NEGATIVE mg/dL
Specific Gravity, Urine: 1.025 (ref 1.005–1.030)
Urobilinogen, UA: 0.2 mg/dL (ref 0.0–1.0)
pH: 5.5 (ref 5.0–8.0)

## 2014-01-04 LAB — POCT PREGNANCY, URINE: Preg Test, Ur: NEGATIVE

## 2014-01-04 MED ORDER — AZITHROMYCIN 250 MG PO TABS
ORAL_TABLET | ORAL | Status: AC
  Filled 2014-01-04: qty 4

## 2014-01-04 MED ORDER — AZITHROMYCIN 250 MG PO TABS
1000.0000 mg | ORAL_TABLET | Freq: Once | ORAL | Status: AC
  Administered 2014-01-04: 1000 mg via ORAL

## 2014-01-04 MED ORDER — CEFTRIAXONE SODIUM 250 MG IJ SOLR
INTRAMUSCULAR | Status: AC
  Filled 2014-01-04: qty 250

## 2014-01-04 MED ORDER — LIDOCAINE HCL (PF) 1 % IJ SOLN
INTRAMUSCULAR | Status: AC
  Filled 2014-01-04: qty 5

## 2014-01-04 MED ORDER — CEFTRIAXONE SODIUM 250 MG IJ SOLR
250.0000 mg | Freq: Once | INTRAMUSCULAR | Status: AC
  Administered 2014-01-04: 250 mg via INTRAMUSCULAR

## 2014-01-04 NOTE — ED Notes (Signed)
Pt   Being  Observed  For   Possible  reaction

## 2014-01-04 NOTE — ED Notes (Signed)
No signs   Of  Any  Reaction  To the  rocephen

## 2014-01-04 NOTE — ED Provider Notes (Signed)
CSN: [id number]     Arrival date & time 01/04/14  1440 History   First MD Initiated Contact with Patient 01/04/14 1517     Chief Complaint  Patient presents with  . Vaginal Discharge   (Consider location/radiation/quality/duration/timing/severity/associated sxs/prior Treatment) HPI Comments: Partner notified her she should be tested for STIs. 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 female presenting with vaginal discharge. The history is provided by the patient.  Vaginal Discharge Quality:  Malodorous Onset quality:  Gradual Duration:  10 days Timing:  Constant Progression:  Unchanged Chronicity:  New Associated symptoms: dysuria   Associated symptoms: no abdominal pain, no dyspareunia, no fever, no genital lesions, no nausea, no rash, no urinary frequency, no urinary hesitancy, no urinary incontinence, no vaginal itching and no vomiting   Associated symptoms comment:  +suprapubic pressure   Past Medical History  Diagnosis Date  . Mental disorder 2010    bipolar  . Scabies exposure 2012    treated july 2012 & reexposed  . Hypertension     During second pregnancy  . Pneumonia   . Chlamydia    Past Surgical History  Procedure Laterality Date  . Mouth surgery      as a child  . Wisdom tooth extraction    . Induced abortion     Family History  Problem Relation Age of Onset  . Anesthesia problems Neg Hx   . Hypertension Mother   . Cancer Mother   . Fibroids Mother   . ADD / ADHD Brother   . Hypertension Maternal Grandmother    History  Substance Use Topics  . Smoking status: Former Smoker    Types: Cigarettes    Quit date: 04/04/2010  . Smokeless tobacco: Never Used  . Alcohol Use: No     Comment: quit when found out was pregnant   OB History    Gravida Para Term Preterm AB TAB SAB Ectopic Multiple Living   5 3 3  0 1 1 0 0 0 3     Review of Systems  Constitutional: Negative for fever.  Gastrointestinal: Negative for nausea, vomiting and abdominal  pain.  Genitourinary: Positive for dysuria and vaginal discharge. Negative for bladder incontinence, hesitancy and dyspareunia.  All other systems reviewed and are negative.   Allergies  Penicillins and Latex  Home Medications   Prior to Admission medications   Medication Sig Start Date End Date Taking? Authorizing Provider  acetaminophen (TYLENOL) 325 MG tablet Take 325 mg by mouth every 6 (six) hours as needed for moderate pain.    Historical Provider, MD  fluconazole (DIFLUCAN) 150 MG tablet Take 1 tablet (150 mg total) by mouth once. For yeast infection 10/12/13   Jacquelin Hawkingalph Nettey, MD  metroNIDAZOLE (FLAGYL) 500 MG tablet Take 1 tablet (500 mg total) by mouth 3 (three) times daily. For bacterial vaginosis 08/18/13   Stephanie Couphristopher M Street, MD  Multiple Vitamin (MULTIVITAMIN WITH MINERALS) TABS tablet Take 1 tablet by mouth daily.    Historical Provider, MD  promethazine (PHENERGAN) 12.5 MG tablet Take 1 tablet (12.5 mg total) by mouth every 6 (six) hours as needed for nausea or vomiting. 06/22/13   Marny LowensteinJulie N Wenzel, PA-C  promethazine (PHENERGAN) 25 MG tablet Take 1 tablet (25 mg total) by mouth every 6 (six) hours as needed for nausea. 08/16/10 08/23/10  Jean RosenthalSusan P Lineberry, NP  ranitidine (ZANTAC) 150 MG tablet Take 1 tablet (150 mg total) by mouth 2 (two) times daily. 06/10/13   Leafy KindleStephanie E  Losq, MD  tetrahydrozoline (VISINE) 0.05 % ophthalmic solution Place 1 drop into the left eye 2 (two) times daily. 10/12/13   Jacquelin Hawkingalph Nettey, MD   BP 124/78 mmHg  Pulse 78  Temp(Src) 98.6 F (37 C) (Oral)  Resp 18  SpO2 100%  LMP 11/27/2013 Physical Exam  Constitutional: She is oriented to person, place, and time. She appears well-developed and well-nourished. No distress.  HENT:  Head: Normocephalic and atraumatic.  Eyes: Conjunctivae are normal.  Cardiovascular: Normal rate.   Pulmonary/Chest: Effort normal.  Genitourinary: Vagina normal and uterus normal. Pelvic exam was performed with patient supine.  There is no rash, tenderness or lesion on the right labia. There is no rash, tenderness or lesion on the left labia. Cervix exhibits no motion tenderness, no discharge and no friability. Right adnexum displays no mass, no tenderness and no fullness. Left adnexum displays no mass, no tenderness and no fullness.  Neurological: She is alert and oriented to person, place, and time.  Skin: Skin is warm and dry. No rash noted. No erythema.  Psychiatric: She has a normal mood and affect. Her behavior is normal.  Nursing note and vitals reviewed.   ED Course  Procedures (including critical care time) Labs Review Labs Reviewed  POCT URINALYSIS DIP (DEVICE) - Abnormal; Notable for the following:    Hgb urine dipstick TRACE (*)    Leukocytes, UA TRACE (*)    All other components within normal limits  URINE CULTURE  POCT PREGNANCY, URINE  CERVICOVAGINAL ANCILLARY ONLY    Imaging Review No results found.   MDM   1. Vaginitis    UPT negative UA unremarkable Will treat empirically with rocephin 250 mg IM and azithromycin 1000[facility]  [facility] follow up if symptoms do not improve. No sex x 2 weeks and only if symptoms have resolved.    Ria ClockJennifer Lee H Kiondre Grenz, [place] 01/04/14 [phone]

## 2014-01-04 NOTE — ED Notes (Signed)
Pt reports     Symptoms     Of  Vaginal discharge       With foul odor  As   Well  As        Burning on  Urination  As  Well      Symptoms  Since  Last  Week   Pt  Also reports  Some low abd pain  Although  She  Is walking  Upright  With a slow steady  Gait

## 2014-01-04 NOTE — Discharge Instructions (Signed)
Vaginitis °Vaginitis is an inflammation of the vagina. It is most often caused by a change in the normal balance of the bacteria and yeast that live in the vagina. This change in balance causes an overgrowth of certain bacteria or yeast, which causes the inflammation. There are different types of vaginitis, but the most common types are: °· Bacterial vaginosis. °· Yeast infection (candidiasis). °· Trichomoniasis vaginitis. This is a sexually transmitted infection (STI). °· Viral vaginitis. °· Atropic vaginitis. °· Allergic vaginitis. °CAUSES  °The cause depends on the type of vaginitis. Vaginitis can be caused by: °· Bacteria (bacterial vaginosis). °· Yeast (yeast infection). °· A parasite (trichomoniasis vaginitis) °· A virus (viral vaginitis). °· Low hormone levels (atrophic vaginitis). Low hormone levels can occur during pregnancy, breastfeeding, or after menopause. °· Irritants, such as bubble baths, scented tampons, and feminine sprays (allergic vaginitis). °Other factors can change the normal balance of the yeast and bacteria that live in the vagina. These include: °· Antibiotic medicines. °· Poor hygiene. °· Diaphragms, vaginal sponges, spermicides, birth control pills, and intrauterine devices (IUD). °· Sexual intercourse. °· Infection. °· Uncontrolled diabetes. °· A weakened immune system. °SYMPTOMS  °Symptoms can vary depending on the cause of the vaginitis. Common symptoms include: °· Abnormal vaginal discharge. °· The discharge is white, gray, or yellow with bacterial vaginosis. °· The discharge is thick, white, and cheesy with a yeast infection. °· The discharge is frothy and yellow or greenish with trichomoniasis. °· A bad vaginal odor. °· The odor is fishy with bacterial vaginosis. °· Vaginal itching, pain, or swelling. °· Painful intercourse. °· Pain or burning when urinating. °Sometimes, there are no symptoms. °TREATMENT  °Treatment will vary depending on the type of infection.  °· Bacterial  vaginosis and trichomoniasis are often treated with antibiotic creams or pills. °· Yeast infections are often treated with antifungal medicines, such as vaginal creams or suppositories. °· Viral vaginitis has no cure, but symptoms can be treated with medicines that relieve discomfort. Your sexual partner should be treated as well. °· Atrophic vaginitis may be treated with an estrogen cream, pill, suppository, or vaginal ring. If vaginal dryness occurs, lubricants and moisturizing creams may help. You may be told to avoid scented soaps, sprays, or douches. °· Allergic vaginitis treatment involves quitting the use of the product that is causing the problem. Vaginal creams can be used to treat the symptoms. °HOME CARE INSTRUCTIONS  °· Take all medicines as directed by your caregiver. °· Keep your genital area clean and dry. Avoid soap and only rinse the area with water. °· Avoid douching. It can remove the healthy bacteria in the vagina. °· Do not use tampons or have sexual intercourse until your vaginitis has been treated. Use sanitary pads while you have vaginitis. °· Wipe from front to back. This avoids the spread of bacteria from the rectum to the vagina. °· Let air reach your genital area. °¨ Wear cotton underwear to decrease moisture buildup. °¨ Avoid wearing underwear while you sleep until your vaginitis is gone. °¨ Avoid tight pants and underwear or nylons without a cotton panel. °¨ Take off wet clothing (especially bathing suits) as soon as possible. °· Use mild, non-scented products. Avoid using irritants, such as: °¨ Scented feminine sprays. °¨ Fabric softeners. °¨ Scented detergents. °¨ Scented tampons. °¨ Scented soaps or bubble baths. °· Practice safe sex and use condoms. Condoms may prevent the spread of trichomoniasis and viral vaginitis. °SEEK MEDICAL CARE IF:  °· You have abdominal pain. °· You   have a fever or persistent symptoms for more than 2-3 days. °· You have a fever and your symptoms suddenly  get worse. °Document Released: 10/27/2006 Document Revised: 09/24/2011 Document Reviewed: 06/12/2011 °ExitCare® Patient Information ©2015 [employer]. This information is not intended to replace advice given to you by your health care provider. Make sure you discuss any questions you have with your health care provider. ° °Sexually Transmitted Disease °A sexually transmitted disease (STD) is a disease or infection that may be passed (transmitted) from person to person, usually during sexual activity. This may happen by way of saliva, semen, blood, vaginal mucus, or urine. Common STDs include:  °· Gonorrhea.   °· Chlamydia.   °· Syphilis.   °· HIV and AIDS.   °· Genital herpes.   °· Hepatitis B and C.   °· Trichomonas.   °· Human papillomavirus (HPV).   °· Pubic lice.   °· Scabies. °· Mites. °· Bacterial vaginosis. °WHAT ARE CAUSES OF STDs? °An STD may be caused by bacteria, a virus, or parasites. STDs are often transmitted during sexual activity if one person is infected. However, they may also be transmitted through nonsexual means. STDs may be transmitted after:  °· Sexual intercourse with an infected person.   °· Sharing sex toys with an infected person.   °· Sharing needles with an infected person or using unclean piercing or tattoo needles. °· Having intimate contact with the genitals, mouth, or rectal areas of an infected person.   °· Exposure to infected fluids during birth. °WHAT ARE THE SIGNS AND SYMPTOMS OF STDs? °Different STDs have different symptoms. Some people may not have any symptoms. If symptoms are present, they may include:  °· Painful or bloody urination.   °· Pain in the pelvis, abdomen, vagina, anus, throat, or eyes.   °· A skin rash, itching, or irritation. °· Growths, ulcerations, blisters, or sores in the genital and anal areas. °· Abnormal vaginal discharge with or without bad odor.   °· Penile discharge in men.   °· Fever.   °· Pain or bleeding during sexual intercourse.   °· Swollen  glands in the groin area.   °· Yellow skin and eyes (jaundice). This is seen with hepatitis.   °· Swollen testicles. °· Infertility. °· Sores and blisters in the mouth. °HOW ARE STDs DIAGNOSED? °To make a diagnosis, your health care provider may:  °· Take a medical history.   °· Perform a physical exam.   °· Take a sample of any discharge to examine. °· Swab the throat, cervix, opening to the penis, rectum, or vagina for testing. °· Test a sample of your first morning urine.   °· Perform blood tests.   °· Perform a Pap test, if this applies.   °· Perform a colposcopy.   °· Perform a laparoscopy.   °HOW ARE STDs TREATED? ° Treatment depends on the STD. Some STDs may be treated but not cured.  °· Chlamydia, gonorrhea, trichomonas, and syphilis can be cured with antibiotic medicine.   °· Genital herpes, hepatitis, and HIV can be treated, but not cured, with prescribed medicines. The medicines lessen symptoms.   °· Genital warts from HPV can be treated with medicine or by freezing, burning (electrocautery), or surgery. Warts may come back.   °· HPV cannot be cured with medicine or surgery. However, abnormal areas may be removed from the cervix, vagina, or vulva.   °· If your diagnosis is confirmed, your recent sexual partners need treatment. This is true even if they are symptom-free or have a negative culture or evaluation. They should not have sex until their health care providers say it is okay. °HOW CAN I REDUCE MY   RISK OF GETTING AN STD? °Take these steps to reduce your risk of getting an STD: °· Use latex condoms, dental dams, and water-soluble lubricants during sexual activity. Do not use petroleum jelly or oils. °· Avoid having multiple sex partners. °· Do not have sex with someone who has other sex partners. °· Do not have sex with anyone you do not know or who is at high risk for an STD. °· Avoid risky sex practices that can break your skin. °· Do not have sex if you have open sores on your mouth or  skin. °· Avoid drinking too much alcohol or taking illegal drugs. Alcohol and drugs can affect your judgment and put you in a vulnerable position. °· Avoid engaging in oral and anal sex acts. °· Get vaccinated for HPV and hepatitis. If you have not received these vaccines in the past, talk to your health care provider about whether one or both might be right for you.   °· If you are at risk of being infected with HIV, it is recommended that you take a prescription medicine daily to prevent HIV infection. This is called pre-exposure prophylaxis (PrEP). You are considered at risk if: °¨ You are a man who has sex with other men (MSM). °¨ You are a heterosexual man or woman and are sexually active with more than one partner. °¨ You take drugs by injection. °¨ You are sexually active with a partner who has HIV. °· Talk with your health care provider about whether you are at high risk of being infected with HIV. If you choose to begin PrEP, you should first be tested for HIV. You should then be tested every 3 months for as long as you are taking PrEP.   °WHAT SHOULD I DO IF I THINK I HAVE AN STD? °· See your health care provider.   °· Tell your sexual partner(s). They should be tested and treated for any STDs. °· Do not have sex until your health care provider says it is okay.  °WHEN SHOULD I GET IMMEDIATE MEDICAL CARE? °Contact your health care provider right away if:  °· You have severe abdominal pain. °· You are a man and notice swelling or pain in your testicles. °· You are a woman and notice swelling or pain in your vagina. °Document Released: 03/22/2002 Document Revised: 01/04/2013 Document Reviewed: 07/20/2012 °ExitCare® Patient Information ©2015 [employer]. This information is not intended to replace advice given to you by your health care provider. Make sure you discuss any questions you have with your health care provider. ° °

## 2014-01-05 ENCOUNTER — Telehealth (HOSPITAL_COMMUNITY): Payer: Self-pay | Admitting: *Deleted

## 2014-01-05 LAB — CERVICOVAGINAL ANCILLARY ONLY
Chlamydia: POSITIVE — AB
Neisseria Gonorrhea: NEGATIVE
Wet Prep (BD Affirm): NEGATIVE
Wet Prep (BD Affirm): NEGATIVE
Wet Prep (BD Affirm): POSITIVE — AB

## 2014-01-05 MED ORDER — NITROFURANTOIN MONOHYD MACRO 100 MG PO CAPS: 100.0000 mg | ORAL_CAPSULE | Freq: Two times a day (BID) | ORAL | Status: DC

## 2014-01-05 NOTE — Progress Notes (Signed)
01/05/2014: Urine culture prelim result consistent with 100,000 colonies of E. Coli. Sensitivities pend. Prescription for Macrobid 100 mg po BID x 7 days sent electronically to patient's pharmacy of record. RN to notify patient of results and new prescription.

## 2014-01-05 NOTE — ED Notes (Signed)
GC neg., Chlamydia pos., Affirm: Candida and Trich neg., Gardnerella pos., Urine culture: prelim E. Coli.  Pt. adequately treated with Zithromax for STD and Flagyl for bacterial vaginosis.  Narda BondsLee Presson PA e-prescribed Macrobid to pt.'s pharmacy today for UTI. I called pt. Pt. verified x 2 and given results.  Pt. told she needs Macrobid for UTI and where to pick up her Rx.  Pt. instructed to notify her partner, no sex for 1 week and to practice safe sex. [facility] by appointment. Vassie MoselleYork, Joseeduardo Brix M 01/05/2014

## 2014-01-07 LAB — URINE CULTURE
Colony Count: >=100000
Special Requests: NORMAL

## 2014-01-09 ENCOUNTER — Telehealth (HOSPITAL_COMMUNITY): Payer: Self-pay | Admitting: Family Medicine

## 2014-01-09 MED ORDER — AZITHROMYCIN 250 MG PO TABS: 1000.0000 mg | ORAL_TABLET | Freq: Once | ORAL | Status: DC

## 2014-01-09 NOTE — ED Notes (Signed)
Patient was seen in the clinic on December 23 and diagnosed with chlamydia. She was treated with ceftriaxone and azithromycin. She vomited the azithromycin when she returned home. Plan to re-prescribe 1 g of azithromycin by mouth. Return to clinic as needed.  Rodolph BongEvan S My Rinke, MD 01/09/14 [phone]

## 2014-01-09 NOTE — ED Notes (Signed)
1  Rodolph BongEvan S Jakavion Bilodeau, MD 01/09/14 [phone]

## 2014-01-10 NOTE — ED Notes (Signed)
[facility] form completed and faxed to the [facility]. Desarae Placide M 01/10/2014  

## 2014-01-16 ENCOUNTER — Emergency Department (HOSPITAL_COMMUNITY): Payer: Self-pay

## 2014-01-16 ENCOUNTER — Emergency Department (HOSPITAL_COMMUNITY)
Admission: EM | Admit: 2014-01-16 | Discharge: 2014-01-16 | Disposition: A | Discharge status: Home / Self Care | Payer: Self-pay | Attending: Emergency Medicine | Admitting: Emergency Medicine

## 2014-01-16 ENCOUNTER — Encounter (HOSPITAL_COMMUNITY): Payer: Self-pay | Admitting: *Deleted

## 2014-01-16 DIAGNOSIS — Z8619 Personal history of other infectious and parasitic diseases: Secondary | ICD-10-CM | POA: Insufficient documentation

## 2014-01-16 DIAGNOSIS — Z87891 Personal history of nicotine dependence: Secondary | ICD-10-CM | POA: Insufficient documentation

## 2014-01-16 DIAGNOSIS — S199XXA Unspecified injury of neck, initial encounter: Secondary | ICD-10-CM | POA: Insufficient documentation

## 2014-01-16 DIAGNOSIS — Z8659 Personal history of other mental and behavioral disorders: Secondary | ICD-10-CM | POA: Insufficient documentation

## 2014-01-16 DIAGNOSIS — Y9389 Activity, other specified: Secondary | ICD-10-CM | POA: Insufficient documentation

## 2014-01-16 DIAGNOSIS — Z3202 Encounter for pregnancy test, result negative: Secondary | ICD-10-CM | POA: Insufficient documentation

## 2014-01-16 DIAGNOSIS — Y998 Other external cause status: Secondary | ICD-10-CM | POA: Insufficient documentation

## 2014-01-16 DIAGNOSIS — Z79899 Other long term (current) drug therapy: Secondary | ICD-10-CM | POA: Insufficient documentation

## 2014-01-16 DIAGNOSIS — Z880 Allergy status to penicillin: Secondary | ICD-10-CM | POA: Insufficient documentation

## 2014-01-16 DIAGNOSIS — Z792 Long term (current) use of antibiotics: Secondary | ICD-10-CM | POA: Insufficient documentation

## 2014-01-16 DIAGNOSIS — Z91040 Latex allergy status: Secondary | ICD-10-CM | POA: Insufficient documentation

## 2014-01-16 DIAGNOSIS — Y9289 Other specified places as the place of occurrence of the external cause: Secondary | ICD-10-CM | POA: Insufficient documentation

## 2014-01-16 DIAGNOSIS — Z8701 Personal history of pneumonia (recurrent): Secondary | ICD-10-CM | POA: Insufficient documentation

## 2014-01-16 LAB — CBC
HCT: 36.4 % (ref 36.0–46.0)
Hemoglobin: 12.4 g/dL (ref 12.0–15.0)
MCH: 30.2 pg (ref 26.0–34.0)
MCHC: 34.1 g/dL (ref 30.0–36.0)
MCV: 88.8 fL (ref 78.0–100.0)
Platelets: 271 10*3/uL (ref 150–400)
RBC: 4.1 MIL/uL (ref 3.87–5.11)
RDW: 12.4 % (ref 11.5–15.5)
WBC: 7.7 10*3/uL (ref 4.0–10.5)

## 2014-01-16 LAB — BASIC METABOLIC PANEL
Anion gap: 7 (ref 5–15)
BUN: 9 mg/dL (ref 6–23)
CO2: 23 mmol/L (ref 19–32)
Calcium: 9.3 mg/dL (ref 8.4–10.5)
Chloride: 109 mEq/L (ref 96–112)
Creatinine, Ser: 0.87 mg/dL (ref 0.50–1.10)
GFR calc Af Amer: >90 mL/min (ref >90)
GFR calc non Af Amer: 90 mL/min — ABNORMAL LOW (ref >90)
Glucose, Bld: 96 mg/dL (ref 70–99)
Potassium: 3.8 mmol/L (ref 3.5–5.1)
Sodium: 139 mmol/L (ref 135–145)

## 2014-01-16 LAB — POC URINE PREG, ED: Preg Test, Ur: NEGATIVE

## 2014-01-16 MED ORDER — IOHEXOL 350 MG/ML SOLN
50.0000 mL | Freq: Once | INTRAVENOUS | Status: AC | PRN
  Administered 2014-01-16: 50 mL via INTRAVENOUS

## 2014-01-16 MED ORDER — ACETAMINOPHEN 500 MG PO TABS
1000.0000 mg | ORAL_TABLET | Freq: Once | ORAL | Status: AC
  Administered 2014-01-16: 1000 mg via ORAL
  Filled 2014-01-16: qty 2
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
## 2014-03-27 ENCOUNTER — Telehealth: Payer: Self-pay | Admitting: Family Medicine

## 2014-03-27 NOTE — Telephone Encounter (Signed)
Spoke with patient, explained, typical symptoms of bed bugs. Patient expressed understanding. Will schedule an appointment should any symptoms arise.

## 2014-03-27 NOTE — Telephone Encounter (Signed)
Has questions about bed bugs Please call

## 2014-04-01 ENCOUNTER — Emergency Department (INDEPENDENT_AMBULATORY_CARE_PROVIDER_SITE_OTHER)
Admission: EM | Admit: 2014-04-01 | Discharge: 2014-04-01 | Disposition: A | Discharge status: Home / Self Care | Payer: Self-pay | Source: Home / Self Care | Attending: Family Medicine | Admitting: Family Medicine

## 2014-04-01 ENCOUNTER — Other Ambulatory Visit (HOSPITAL_COMMUNITY)
Admission: RE | Admit: 2014-04-01 | Discharge: 2014-04-01 | Disposition: A | Discharge status: Home / Self Care | Payer: Self-pay | Source: Ambulatory Visit | Attending: Family Medicine | Admitting: Family Medicine

## 2014-04-01 ENCOUNTER — Encounter (HOSPITAL_COMMUNITY): Payer: Self-pay | Admitting: Emergency Medicine

## 2014-04-01 DIAGNOSIS — Z113 Encounter for screening for infections with a predominantly sexual mode of transmission: Secondary | ICD-10-CM | POA: Insufficient documentation

## 2014-04-01 DIAGNOSIS — N760 Acute vaginitis: Secondary | ICD-10-CM | POA: Insufficient documentation

## 2014-04-01 DIAGNOSIS — N390 Urinary tract infection, site not specified: Secondary | ICD-10-CM

## 2014-04-01 DIAGNOSIS — N898 Other specified noninflammatory disorders of vagina: Secondary | ICD-10-CM

## 2014-04-01 LAB — POCT URINALYSIS DIP (DEVICE)
Bilirubin Urine: NEGATIVE
Glucose, UA: NEGATIVE mg/dL
Ketones, ur: NEGATIVE mg/dL
Nitrite: NEGATIVE
Protein, ur: NEGATIVE mg/dL
Specific Gravity, Urine: 1.02 (ref 1.005–1.030)
Urobilinogen, UA: 0.2 mg/dL (ref 0.0–1.0)
pH: 6 (ref 5.0–8.0)

## 2014-04-01 LAB — POCT PREGNANCY, URINE: Preg Test, Ur: NEGATIVE

## 2014-04-01 MED ORDER — NITROFURANTOIN MONOHYD MACRO 100 MG PO CAPS: 100.0000 mg | ORAL_CAPSULE | Freq: Two times a day (BID) | ORAL | Status: DC

## 2014-04-01 MED ORDER — CEFTRIAXONE SODIUM 250 MG IJ SOLR
INTRAMUSCULAR | Status: AC
  Filled 2014-04-01: qty 250

## 2014-04-01 MED ORDER — METRONIDAZOLE 500 MG PO TABS: 500.0000 mg | ORAL_TABLET | Freq: Two times a day (BID) | ORAL | Status: DC

## 2014-04-01 MED ORDER — AZITHROMYCIN 250 MG PO TABS
ORAL_TABLET | ORAL | Status: AC
  Filled 2014-04-01: qty 4

## 2014-04-01 MED ORDER — LIDOCAINE HCL (PF) 1 % IJ SOLN
INTRAMUSCULAR | Status: AC
  Filled 2014-04-01: qty 5

## 2014-04-01 MED ORDER — CEFTRIAXONE SODIUM 250 MG IJ SOLR
250.0000 mg | Freq: Once | INTRAMUSCULAR | Status: AC
  Administered 2014-04-01: 250 mg via INTRAMUSCULAR

## 2014-04-01 MED ORDER — AZITHROMYCIN 250 MG PO TABS
1000.0000 mg | ORAL_TABLET | Freq: Once | ORAL | Status: AC
  Administered 2014-04-01: 1000 mg via ORAL

## 2014-04-01 NOTE — ED Provider Notes (Signed)
Rachel Lloyd is a 29 y.o. female who presents to Urgent Care today for vaginal discharge, and urinary frequency urgency and dysuria. Symptoms present about a week. Patient has unprotected sex and is concerned she may have an STD. She's tried some Monistat which did not help. She took some leftover antibiotics from a urinary infection which also didn't help. No fevers or chills vomiting or diarrhea.   Past Medical History  Diagnosis Date  . Mental disorder 2010    bipolar  . Scabies exposure 2012    treated july 2012 & reexposed  . Hypertension     During second pregnancy  . Pneumonia   . Chlamydia    Past Surgical History  Procedure Laterality Date  . Mouth surgery      as a child  . Wisdom tooth extraction    . Induced abortion     History  Substance Use Topics  . Smoking status: Former Smoker    Types: Cigarettes    Quit date: 04/04/2010  . Smokeless tobacco: Never Used  . Alcohol Use: No     Comment: quit when found out was pregnant   ROS as above Medications: Current Facility-Administered Medications  Medication Dose Route Frequency Provider Last Rate Last Dose  . azithromycin (ZITHROMAX) tablet 1,000 mg  1,000 mg Oral Once Rodolph Bong, MD      . cefTRIAXone (ROCEPHIN) injection 250 mg  250 mg Intramuscular Once Rodolph Bong, MD       Current Outpatient Prescriptions  Medication Sig Dispense Refill  . acetaminophen (TYLENOL) 325 MG tablet Take 325 mg by mouth every 6 (six) hours as needed for moderate pain.    Marland Kitchen azithromycin (ZITHROMAX) 250 MG tablet Take 4 tablets (1,000 mg total) by mouth once. (Patient not taking: Reported on 01/16/2014) 4 tablet 0  . fluconazole (DIFLUCAN) 150 MG tablet Take 1 tablet (150 mg total) by mouth once. For yeast infection (Patient not taking: Reported on 01/16/2014) 1 tablet 2  . metroNIDAZOLE (FLAGYL) 500 MG tablet Take 1 tablet (500 mg total) by mouth 3 (three) times daily. For bacterial vaginosis (Patient not taking: Reported on  01/16/2014) 21 tablet 0  . nitrofurantoin, macrocrystal-monohydrate, (MACROBID) 100 MG capsule Take 1 capsule (100 mg total) by mouth 2 (two) times daily. 14 capsule 0  . promethazine (PHENERGAN) 12.5 MG tablet Take 1 tablet (12.5 mg total) by mouth every 6 (six) hours as needed for nausea or vomiting. (Patient not taking: Reported on 01/16/2014) 30 tablet 0  . promethazine (PHENERGAN) 25 MG tablet Take 1 tablet (25 mg total) by mouth every 6 (six) hours as needed for nausea. 30 tablet 0  . ranitidine (ZANTAC) 150 MG tablet Take 1 tablet (150 mg total) by mouth 2 (two) times daily. (Patient not taking: Reported on 01/16/2014) 60 tablet 0  . tetrahydrozoline (VISINE) 0.05 % ophthalmic solution Place 1 drop into the left eye 2 (two) times daily. (Patient not taking: Reported on 01/16/2014) 15 mL 0   Allergies  Allergen Reactions  . Penicillins Anaphylaxis  . Latex Itching and Rash     Exam:  BP 117/81 mmHg  Pulse 64  Temp(Src) 98.2 F (36.8 C) (Oral)  Resp 18  SpO2 100%  LMP 03/20/2014 Gen: Well NAD HEENT: EOMI,  MMM Lungs: Normal work of breathing. CTABL Heart: RRR no MRG Abd: NABS, Soft. Nondistended, Nontender no CVA tenderness to percussion Exts: Brisk capillary refill, warm and well perfused.  GYN: Normal external genitalia. Vaginal canal with thin  white discharge normal-appearing cervix.  Results for orders placed or performed during the hospital encounter of 04/01/14 (from the past 24 hour(s))  POCT urinalysis dip (device)     Status: Abnormal   Collection Time: 04/01/14 11:06 AM  Result Value Ref Range   Glucose, UA NEGATIVE NEGATIVE mg/dL   Bilirubin Urine NEGATIVE NEGATIVE   Ketones, ur NEGATIVE NEGATIVE mg/dL   Specific Gravity, Urine 1.020 1.005 - 1.030   Hgb urine dipstick SMALL (A) NEGATIVE   pH 6.0 5.0 - 8.0   Protein, ur NEGATIVE NEGATIVE mg/dL   Urobilinogen, UA 0.2 0.0 - 1.0 mg/dL   Nitrite NEGATIVE NEGATIVE   Leukocytes, UA SMALL (A) NEGATIVE  Pregnancy, urine POC      Status: None   Collection Time: 04/01/14 11:09 AM  Result Value Ref Range   Preg Test, Ur NEGATIVE NEGATIVE   No results found.  Assessment and Plan: 29 y.o. female with  1) vaginitis: Patient was given 1 g oral azithromycin, and 250 mg of IM ceftriaxone. Vaginal cytology for gonorrhea and chlamydia trichomonas BV and yeast as well as serology for HIV and syphilis pending. Treat with medicines as above as well as one week of Flagyl. 2) UTI: Culture pending treat with nitrofurantoin  Discussed warning signs or symptoms. Please see discharge instructions. Patient expresses understanding.     Rodolph BongEvan S Corey, MD 04/01/14 [phone]

## 2014-04-01 NOTE — Discharge Instructions (Signed)
Thank you for coming in today. If your belly pain worsens, or you have high fever, bad vomiting, blood in your stool or black tarry stool go to the Emergency Room.  I will call if anything comes back positive.   Urinary Tract Infection Urinary tract infections (UTIs) can develop anywhere along your urinary tract. Your urinary tract is your body's drainage system for removing wastes and extra water. Your urinary tract includes two kidneys, two ureters, a bladder, and a urethra. Your kidneys are a pair of bean-shaped organs. Each kidney is about the size of your fist. They are located below your ribs, one on each side of your spine. CAUSES Infections are caused by microbes, which are microscopic organisms, including fungi, viruses, and bacteria. These organisms are so small that they can only be seen through a microscope. Bacteria are the microbes that most commonly cause UTIs. SYMPTOMS  Symptoms of UTIs may vary by age and gender of the patient and by the location of the infection. Symptoms in young women typically include a frequent and intense urge to urinate and a painful, burning feeling in the bladder or urethra during urination. Older women and men are more likely to be tired, shaky, and weak and have muscle aches and abdominal pain. A fever may mean the infection is in your kidneys. Other symptoms of a kidney infection include pain in your back or sides below the ribs, nausea, and vomiting. DIAGNOSIS To diagnose a UTI, your caregiver will ask you about your symptoms. Your caregiver also will ask to provide a urine sample. The urine sample will be tested for bacteria and white blood cells. White blood cells are made by your body to help fight infection. TREATMENT  Typically, UTIs can be treated with medication. Because most UTIs are caused by a bacterial infection, they usually can be treated with the use of antibiotics. The choice of antibiotic and length of treatment depend on your symptoms and  the type of bacteria causing your infection. HOME CARE INSTRUCTIONS  If you were prescribed antibiotics, take them exactly as your caregiver instructs you. Finish the medication even if you feel better after you have only taken some of the medication.  Drink enough water and fluids to keep your urine clear or pale yellow.  Avoid caffeine, tea, and carbonated beverages. They tend to irritate your bladder.  Empty your bladder often. Avoid holding urine for long periods of time.  Empty your bladder before and after sexual intercourse.  After a bowel movement, women should cleanse from front to back. Use each tissue only once. SEEK MEDICAL CARE IF:   You have back pain.  You develop a fever.  Your symptoms do not begin to resolve within 3 days. SEEK IMMEDIATE MEDICAL CARE IF:   You have severe back pain or lower abdominal pain.  You develop chills.  You have nausea or vomiting.  You have continued burning or discomfort with urination. MAKE SURE YOU:   Understand these instructions.  Will watch your condition.  Will get help right away if you are not doing well or get worse. Document Released: 10/09/2004 Document Revised: 07/01/2011 Document Reviewed: 02/07/2011 [facility]ExitCare Patient Information 2015 [employer] [place]. This information is not intended to replace advice given to you by your health care provider. Make sure you discuss any questions you have with your health care provider.   Vaginitis Vaginitis is an inflammation of the vagina. It is most often caused by a change in the normal balance of the bacteria  and yeast that live in the vagina. This change in balance causes an overgrowth of certain bacteria or yeast, which causes the inflammation. There are different types of vaginitis, but the most common types are:  Bacterial vaginosis.  Yeast infection (candidiasis).  Trichomoniasis vaginitis. This is a sexually transmitted infection (STI).  Viral vaginitis.  Atropic  vaginitis.  Allergic vaginitis. CAUSES  The cause depends on the type of vaginitis. Vaginitis can be caused by:  Bacteria (bacterial vaginosis).  Yeast (yeast infection).  A parasite (trichomoniasis vaginitis)  A virus (viral vaginitis).  Low hormone levels (atrophic vaginitis). Low hormone levels can occur during pregnancy, breastfeeding, or after menopause.  Irritants, such as bubble baths, scented tampons, and feminine sprays (allergic vaginitis). Other factors can change the normal balance of the yeast and bacteria that live in the vagina. These include:  Antibiotic medicines.  Poor hygiene.  Diaphragms, vaginal sponges, spermicides, birth control pills, and intrauterine devices (IUD).  Sexual intercourse.  Infection.  Uncontrolled diabetes.  A weakened immune system. SYMPTOMS  Symptoms can vary depending on the cause of the vaginitis. Common symptoms include:  Abnormal vaginal discharge.  The discharge is white, gray, or yellow with bacterial vaginosis.  The discharge is thick, white, and cheesy with a yeast infection.  The discharge is frothy and yellow or greenish with trichomoniasis.  A bad vaginal odor.  The odor is fishy with bacterial vaginosis.  Vaginal itching, pain, or swelling.  Painful intercourse.  Pain or burning when urinating. Sometimes, there are no symptoms. TREATMENT  Treatment will vary depending on the type of infection.   Bacterial vaginosis and trichomoniasis are often treated with antibiotic creams or pills.  Yeast infections are often treated with antifungal medicines, such as vaginal creams or suppositories.  Viral vaginitis has no cure, but symptoms can be treated with medicines that relieve discomfort. Your sexual partner should be treated as well.  Atrophic vaginitis may be treated with an estrogen cream, pill, suppository, or vaginal ring. If vaginal dryness occurs, lubricants and moisturizing creams may help. You may be  told to avoid scented soaps, sprays, or douches.  Allergic vaginitis treatment involves quitting the use of the product that is causing the problem. Vaginal creams can be used to treat the symptoms. HOME CARE INSTRUCTIONS   Take all medicines as directed by your caregiver.  Keep your genital area clean and dry. Avoid soap and only rinse the area with water.  Avoid douching. It can remove the healthy bacteria in the vagina.  Do not use tampons or have sexual intercourse until your vaginitis has been treated. Use sanitary pads while you have vaginitis.  Wipe from front to back. This avoids the spread of bacteria from the rectum to the vagina.  Let air reach your genital area.  Wear cotton underwear to decrease moisture buildup.  Avoid wearing underwear while you sleep until your vaginitis is gone.  Avoid tight pants and underwear or nylons without a cotton panel.  Take off wet clothing (especially bathing suits) as soon as possible.  Use mild, non-scented products. Avoid using irritants, such as:  Scented feminine sprays.  Fabric softeners.  Scented detergents.  Scented tampons.  Scented soaps or bubble baths.  Practice safe sex and use condoms. Condoms may prevent the spread of trichomoniasis and viral vaginitis. SEEK MEDICAL CARE IF:   You have abdominal pain.  You have a fever or persistent symptoms for more than 2-3 days.  You have a fever and your symptoms suddenly get worse.  Document Released: 10/27/2006 Document Revised: 09/24/2011 Document Reviewed: 06/12/2011 [facility] Patient Information 2015 [place], [place]. This information is not intended to replace advice given to you by your health care provider. Make sure you discuss any questions you have with your health care provider.

## 2014-04-01 NOTE — ED Notes (Signed)
Pt states that she saw information that her sexual partner possibly had a STD.

## 2014-04-03 LAB — CERVICOVAGINAL ANCILLARY ONLY
Chlamydia: NEGATIVE
Neisseria Gonorrhea: NEGATIVE
Wet Prep (BD Affirm): NEGATIVE
Wet Prep (BD Affirm): NEGATIVE
Wet Prep (BD Affirm): POSITIVE — AB

## 2014-04-03 LAB — HIV ANTIBODY (ROUTINE TESTING W REFLEX): HIV Screen 4th Generation wRfx: NONREACTIVE

## 2014-04-03 LAB — RPR: RPR Ser Ql: NONREACTIVE

## 2014-04-05 NOTE — ED Notes (Signed)
GC/Chlamydia neg., Affirm: Candida and Trich neg., Gardnerella pos., HIV/RPR non-reactive.  Pt. adequately treated with Flagyl. Vassie MoselleYork, Tabithia Stroder M 04/05/2014

## 2014-05-08 ENCOUNTER — Inpatient Hospital Stay (HOSPITAL_COMMUNITY): Payer: Self-pay

## 2014-05-08 ENCOUNTER — Inpatient Hospital Stay (HOSPITAL_COMMUNITY)
Admission: AD | Admit: 2014-05-08 | Discharge: 2014-05-08 | Disposition: A | Discharge status: Home / Self Care | Payer: Self-pay | Source: Ambulatory Visit | Attending: Obstetrics & Gynecology | Admitting: Obstetrics & Gynecology

## 2014-05-08 ENCOUNTER — Encounter (HOSPITAL_COMMUNITY): Payer: Self-pay | Admitting: *Deleted

## 2014-05-08 DIAGNOSIS — Z3A01 Less than 8 weeks gestation of pregnancy: Secondary | ICD-10-CM | POA: Insufficient documentation

## 2014-05-08 DIAGNOSIS — O26899 Other specified pregnancy related conditions, unspecified trimester: Secondary | ICD-10-CM

## 2014-05-08 DIAGNOSIS — O23591 Infection of other part of genital tract in pregnancy, first trimester: Secondary | ICD-10-CM | POA: Insufficient documentation

## 2014-05-08 DIAGNOSIS — R109 Unspecified abdominal pain: Secondary | ICD-10-CM | POA: Insufficient documentation

## 2014-05-08 DIAGNOSIS — B9689 Other specified bacterial agents as the cause of diseases classified elsewhere: Secondary | ICD-10-CM

## 2014-05-08 DIAGNOSIS — N760 Acute vaginitis: Secondary | ICD-10-CM | POA: Insufficient documentation

## 2014-05-08 DIAGNOSIS — Z87891 Personal history of nicotine dependence: Secondary | ICD-10-CM | POA: Insufficient documentation

## 2014-05-08 DIAGNOSIS — A499 Bacterial infection, unspecified: Secondary | ICD-10-CM

## 2014-05-08 LAB — CBC
HCT: 34 % — ABNORMAL LOW (ref 36.0–46.0)
Hemoglobin: 11.6 g/dL — ABNORMAL LOW (ref 12.0–15.0)
MCH: 30.6 pg (ref 26.0–34.0)
MCHC: 34.1 g/dL (ref 30.0–36.0)
MCV: 89.7 fL (ref 78.0–100.0)
Platelets: 243 10*3/uL (ref 150–400)
RBC: 3.79 MIL/uL — ABNORMAL LOW (ref 3.87–5.11)
RDW: 12.9 % (ref 11.5–15.5)
WBC: 7.3 10*3/uL (ref 4.0–10.5)

## 2014-05-08 LAB — HCG, QUANTITATIVE, PREGNANCY: hCG, Beta Chain, Quant, S: 36390 m[IU]/mL — ABNORMAL HIGH (ref <5)

## 2014-05-08 LAB — URINALYSIS, ROUTINE W REFLEX MICROSCOPIC
Bilirubin Urine: NEGATIVE
Glucose, UA: NEGATIVE mg/dL
Ketones, ur: NEGATIVE mg/dL
Leukocytes, UA: NEGATIVE
Nitrite: NEGATIVE
Protein, ur: NEGATIVE mg/dL
Specific Gravity, Urine: 1.02 (ref 1.005–1.030)
Urobilinogen, UA: 0.2 mg/dL (ref 0.0–1.0)
pH: 6 (ref 5.0–8.0)

## 2014-05-08 LAB — POCT PREGNANCY, URINE: Preg Test, Ur: POSITIVE — AB

## 2014-05-08 LAB — WET PREP, GENITAL
Trich, Wet Prep: NONE SEEN
Yeast Wet Prep HPF POC: NONE SEEN

## 2014-05-08 LAB — URINE MICROSCOPIC-ADD ON

## 2014-05-08 MED ORDER — METRONIDAZOLE 500 MG PO TABS: 500.0000 mg | ORAL_TABLET | Freq: Two times a day (BID) | ORAL | Status: DC

## 2014-05-08 MED ORDER — PROMETHAZINE HCL 25 MG PO TABS
12.5000 mg | ORAL_TABLET | Freq: Four times a day (QID) | ORAL | Status: DC | PRN
Start: 2014-05-08 — End: 2014-05-18

## 2014-05-08 NOTE — MAU Note (Signed)
Thinks she has a yeast infection, been itching really bad.  Has been having low back and cramping in lower abd.  +HPT, been throwing up yellow stuff for 2 days.

## 2014-05-08 NOTE — Discharge Instructions (Signed)
Prenatal Care Providers °[facility]    [facility] ° Phone- [phone]     Phone: [phone] °         °[facility]                      [facility] ° @[place]     Phone: [phone] ° Phone: [phone] °        [facility] °[facility]     Phone: [phone] ° Phone: [phone]   °        [facility] °[facility] [place]                hone: [phone] ° Phone: [phone] °        [facility] °Dr. Bernard Marshall      Phone: [phone] ° Phone: [phone] °        [facility] °[facility]                Phone: [phone] ° Women’s Health  ° Phone:[phone]    [facility] °         Phone: [phone] °[facility] °  Phone: [phone] °Safe Medications in Pregnancy  ° °Acne: °Benzoyl Peroxide °Salicylic Acid ° °Backache/Headache: °Tylenol: 2 regular strength every 4 hours OR °             2 Extra strength every 6 hours ° °Colds/Coughs/Allergies: °Benadryl (alcohol free) 25 mg every 6 hours as needed °Breath right strips °Claritin °Cepacol throat lozenges °Chloraseptic throat spray °Cold-Eeze- up to three times per day °Cough drops, alcohol free °Flonase (by prescription only) °Guaifenesin °Mucinex °Robitussin DM (plain only, alcohol free) °Saline nasal spray/drops °Sudafed (pseudoephedrine) & Actifed ** use only after [redacted] weeks gestation and if you do not have high blood pressure °Tylenol °Vicks Vaporub °Zinc lozenges °Zyrtec  ° °Constipation: °Colace °Ducolax suppositories °Fleet enema °Glycerin suppositories °Metamucil °Milk of magnesia °Miralax °Senokot °Smooth move tea ° °Diarrhea: °Kaopectate °Imodium A-D ° °*NO pepto Bismol ° °Hemorrhoids: °Anusol °Anusol HC °Preparation H °Tucks ° °Indigestion: °Tums °Maalox °Mylanta °Zantac  °Pepcid ° °Insomnia: °Benadryl (alcohol free) 25mg every 6 hours as needed °Tylenol  PM °Unisom, no Gelcaps ° °Leg Cramps: °Tums °MagGel ° °Nausea/Vomiting:  °Bonine °Dramamine °Emetrol °Ginger extract °Sea bands °Meclizine  °Nausea medication to take during pregnancy:  °Unisom (doxylamine succinate 25 mg tablets) Take one tablet daily at bedtime. If symptoms are not adequately controlled, the dose can be increased to a maximum recommended dose of two tablets daily (1/2 tablet in the morning, 1/2 tablet mid-afternoon and one at bedtime). °Vitamin B6 100mg tablets. Take one tablet twice a day (up to 200 mg per day). ° °Skin Rashes: °Aveeno products °Benadryl cream or 25mg every 6 hours as needed °Calamine Lotion °1% cortisone cream ° °Yeast infection: °Gyne-lotrimin 7 °Monistat 7 ° ° °**If taking multiple medications, please check labels to avoid duplicating the same active ingredients °**take medication as directed on the label °** Do not exceed 4000 mg of tylenol in 24 hours °**Do not take medications that contain aspirin or ibuprofen ° ° °First Trimester of Pregnancy °The first trimester of pregnancy is from week 1 until the end of week 12 (months 1 through 3). A week after a sperm fertilizes an egg, the egg will implant on the wall of the uterus. This embryo will   begin to develop into a baby. Genes from you and your partner are forming the baby. The female genes determine whether the baby is a boy or a girl. At 6-8 weeks, the eyes and face are formed, and the heartbeat can be seen on ultrasound. At the end of 12 weeks, all the baby's organs are formed.  °Now that you are pregnant, you will want to do everything you can to have a healthy baby. Two of the most important things are to get good prenatal care and to follow your health care provider's instructions. Prenatal care is all the medical care you receive before the baby's birth. This care will help prevent, find, and treat any problems during the pregnancy and childbirth. °BODY CHANGES °Your body goes through many changes during pregnancy. The  changes vary from woman to woman.  °· You may gain or lose a couple of pounds at first. °· You may feel sick to your stomach (nauseous) and throw up (vomit). If the vomiting is uncontrollable, call your health care provider. °· You may tire easily. °· You may develop headaches that can be relieved by medicines approved by your health care provider. °· You may urinate more often. Painful urination may mean you have a bladder infection. °· You may develop heartburn as a result of your pregnancy. °· You may develop constipation because certain hormones are causing the muscles that push waste through your intestines to slow down. °· You may develop hemorrhoids or swollen, bulging veins (varicose veins). °· Your breasts may begin to grow larger and become tender. Your nipples may stick out more, and the tissue that surrounds them (areola) may become darker. °· Your gums may bleed and may be sensitive to brushing and flossing. °· Dark spots or blotches (chloasma, mask of pregnancy) may develop on your face. This will likely fade after the baby is born. °· Your menstrual periods will stop. °· You may have a loss of appetite. °· You may develop cravings for certain kinds of food. °· You may have changes in your emotions from day to day, such as being excited to be pregnant or being concerned that something may go wrong with the pregnancy and baby. °· You may have more vivid and strange dreams. °· You may have changes in your hair. These can include thickening of your hair, rapid growth, and changes in texture. Some women also have hair loss during or after pregnancy, or hair that feels dry or thin. Your hair will most likely return to normal after your baby is born. °WHAT TO EXPECT AT YOUR PRENATAL VISITS °During a routine prenatal visit: °· You will be weighed to make sure you and the baby are growing normally. °· Your blood pressure will be taken. °· Your abdomen will be measured to track your baby's growth. °· The fetal  heartbeat will be listened to starting around week 10 or 12 of your pregnancy. °· Test results from any previous visits will be discussed. °Your health care provider may ask you: °· How you are feeling. °· If you are feeling the baby move. °· If you have had any abnormal symptoms, such as leaking fluid, bleeding, severe headaches, or abdominal cramping. °· If you have any questions. °Other tests that may be performed during your first trimester include: °· Blood tests to find your blood type and to check for the presence of any previous infections. They will also be used to check for low iron levels (anemia) and Rh antibodies. Later   in the pregnancy, blood tests for diabetes will be done along with other tests if problems develop. °· Urine tests to check for infections, diabetes, or protein in the urine. °· An ultrasound to confirm the proper growth and development of the baby. °· An amniocentesis to check for possible genetic problems. °· Fetal screens for spina bifida and Down syndrome. °· You may need other tests to make sure you and the baby are doing well. °HOME CARE INSTRUCTIONS  °Medicines °· Follow your health care provider's instructions regarding medicine use. Specific medicines may be either safe or unsafe to take during pregnancy. °· Take your prenatal vitamins as directed. °· If you develop constipation, try taking a stool softener if your health care provider approves. °Diet °· Eat regular, well-balanced meals. Choose a variety of foods, such as meat or vegetable-based protein, fish, milk and low-fat dairy products, vegetables, fruits, and whole grain breads and cereals. Your health care provider will help you determine the amount of weight gain that is right for you. °· Avoid raw meat and uncooked cheese. These carry germs that can cause birth defects in the baby. °· Eating four or five small meals rather than three large meals a day may help relieve nausea and vomiting. If you start to feel nauseous,  eating a few soda crackers can be helpful. Drinking liquids between meals instead of during meals also seems to help nausea and vomiting. °· If you develop constipation, eat more high-fiber foods, such as fresh vegetables or fruit and whole grains. Drink enough fluids to keep your urine clear or pale yellow. °Activity and Exercise °· Exercise only as directed by your health care provider. Exercising will help you: °¨ Control your weight. °¨ Stay in shape. °¨ Be prepared for labor and delivery. °· Experiencing pain or cramping in the lower abdomen or low back is a good sign that you should stop exercising. Check with your health care provider before continuing normal exercises. °· Try to avoid standing for long periods of time. Move your legs often if you must stand in one place for a long time. °· Avoid heavy lifting. °· Wear low-heeled shoes, and practice good posture. °· You may continue to have sex unless your health care provider directs you otherwise. °Relief of Pain or Discomfort °· Wear a good support bra for breast tenderness.   °· Take warm sitz baths to soothe any pain or discomfort caused by hemorrhoids. Use hemorrhoid cream if your health care provider approves.   °· Rest with your legs elevated if you have leg cramps or low back pain. °· If you develop varicose veins in your legs, wear support hose. Elevate your feet for 15 minutes, 3-4 times a day. Limit salt in your diet. °Prenatal Care °· Schedule your prenatal visits by the twelfth week of pregnancy. They are usually scheduled monthly at first, then more often in the last 2 months before delivery. °· Write down your questions. Take them to your prenatal visits. °· Keep all your prenatal visits as directed by your health care provider. °Safety °· Wear your seat belt at all times when driving. °· Make a list of emergency phone numbers, including numbers for family, friends, the hospital, and police and fire departments. °General Tips °· Ask your  health care provider for a referral to a local prenatal education class. Begin classes no later than at the beginning of month 6 of your pregnancy. °· Ask for help if you have counseling or nutritional needs during pregnancy. Your   health care provider can offer advice or refer you to specialists for help with various needs. °· Do not use hot tubs, steam rooms, or saunas. °· Do not douche or use tampons or scented sanitary pads. °· Do not cross your legs for long periods of time. °· Avoid cat litter boxes and soil used by cats. These carry germs that can cause birth defects in the baby and possibly loss of the fetus by miscarriage or stillbirth. °· Avoid all smoking, herbs, alcohol, and medicines not prescribed by your health care provider. Chemicals in these affect the formation and growth of the baby. °· Schedule a dentist appointment. At home, brush your teeth with a soft toothbrush and be gentle when you floss. °SEEK MEDICAL CARE IF:  °· You have dizziness. °· You have mild pelvic cramps, pelvic pressure, or nagging pain in the abdominal area. °· You have persistent nausea, vomiting, or diarrhea. °· You have a bad smelling vaginal discharge. °· You have pain with urination. °· You notice increased swelling in your face, hands, legs, or ankles. °SEEK IMMEDIATE MEDICAL CARE IF:  °· You have a fever. °· You are leaking fluid from your vagina. °· You have spotting or bleeding from your vagina. °· You have severe abdominal cramping or pain. °· You have rapid weight gain or loss. °· You vomit blood or material that looks like coffee grounds. °· You are exposed to German measles and have never had them. °· You are exposed to fifth disease or chickenpox. °· You develop a severe headache. °· You have shortness of breath. °· You have any kind of trauma, such as from a fall or a car accident. °Document Released: 12/24/2000 Document Revised: 05/16/2013 Document Reviewed: 11/09/2012 °ExitCare® Patient Information ©2015  [employer]. This information is not intended to replace advice given to you by your health care provider. Make sure you discuss any questions you have with your health care provider. ° °

## 2014-05-08 NOTE — MAU Provider Note (Signed)
History     CSN: [id number]  Arrival date and time: 05/08/14 1727   First Provider Initiated Contact with Patient 05/08/14 1834      Chief Complaint  Patient presents with  . Abdominal Pain  . Vaginal Itching  . Emesis  . Possible Pregnancy   HPI   [name] is a 29 y.o. female [phone] at [redacted]w[redacted]d who presents with abdominal pain that started over the weekend; 2 days ago. The pain is located in her left lower side of her abdomen and in her lower back. The pain comes and goes. She has tried tylenol with some relief. She currently rates her pain 8/10. The last time she took tylenol was 3 days ago.   She also complains of vaginal itching that started 3-4 days ago. The itching is constant. She was recently treated for BV and UTI and stopped treatment because of positive pregnancy test. She feels the symptoms started after the antibiotics. She has not tried any over the counter treatment for yeast.   OB History    Gravida Para Term Preterm AB TAB SAB Ectopic Multiple Living   7 3 3  0 2 2 0 0 0 3      Past Medical History  Diagnosis Date  . Mental disorder 2010    bipolar  . Scabies exposure 2012    treated july 2012 & reexposed  . Hypertension     During second pregnancy  . Pneumonia   . Chlamydia     Past Surgical History  Procedure Laterality Date  . Mouth surgery      as a child  . Wisdom tooth extraction    . Induced abortion      Family History  Problem Relation Age of Onset  . Anesthesia problems Neg Hx   . Hypertension Mother   . Cancer Mother   . Fibroids Mother   . ADD / ADHD Brother   . Hypertension Maternal Grandmother     History  Substance Use Topics  . Smoking status: Former Smoker    Types: Cigarettes    Quit date: 04/04/2010  . Smokeless tobacco: Never Used  . Alcohol Use: No     Comment: quit when found out was pregnant    Allergies:  Allergies  Allergen Reactions  . Penicillins Anaphylaxis  . Latex Itching and Rash     Prescriptions prior to admission  Medication Sig Dispense Refill Last Dose  . acetaminophen (TYLENOL) 325 MG tablet Take 325 mg by mouth every 6 (six) hours as needed for moderate pain.   more than one month  . metroNIDAZOLE (FLAGYL) 500 MG tablet Take 1 tablet (500 mg total) by mouth 2 (two) times daily. (Patient not taking: Reported on 05/08/2014) 14 tablet 0   . nitrofurantoin, macrocrystal-monohydrate, (MACROBID) 100 MG capsule Take 1 capsule (100 mg total) by mouth 2 (two) times daily. (Patient not taking: Reported on 05/08/2014) 10 capsule 0     Results for orders placed or performed during the hospital encounter of 05/08/14 (from the past 48 hour(s))  Urinalysis, Routine w reflex microscopic     Status: Abnormal   Collection Time: 05/08/14  5:45 PM  Result Value Ref Range   Color, Urine YELLOW YELLOW   APPearance CLEAR CLEAR   Specific Gravity, Urine 1.020 1.005 - 1.030   pH 6.0 5.0 - 8.0   Glucose, UA NEGATIVE NEGATIVE mg/dL   Hgb urine dipstick SMALL (A) NEGATIVE   Bilirubin Urine NEGATIVE NEGATIVE   Ketones,  ur NEGATIVE NEGATIVE mg/dL   Protein, ur NEGATIVE NEGATIVE mg/dL   Urobilinogen, UA 0.2 0.0 - 1.0 mg/dL   Nitrite NEGATIVE NEGATIVE   Leukocytes, UA NEGATIVE NEGATIVE  Urine microscopic-add on     Status: Abnormal   Collection Time: 05/08/14  5:45 PM  Result Value Ref Range   Squamous Epithelial / LPF MANY (A) RARE   WBC, UA 0-2 <3 WBC/hpf   RBC / HPF 3-6 <3 RBC/hpf   Bacteria, UA FEW (A) RARE  Pregnancy, urine POC     Status: Abnormal   Collection Time: 05/08/14  5:50 PM  Result Value Ref Range   Preg Test, Ur POSITIVE (A) NEGATIVE    Comment:        THE SENSITIVITY OF THIS METHODOLOGY IS >24 mIU/mL   Wet prep, genital     Status: Abnormal   Collection Time: 05/08/14  6:45 PM  Result Value Ref Range   Yeast Wet Prep HPF POC NONE SEEN NONE SEEN   Trich, Wet Prep NONE SEEN NONE SEEN   Clue Cells Wet Prep HPF POC MODERATE (A) NONE SEEN   WBC, Wet Prep HPF  POC MODERATE (A) NONE SEEN    Comment: BACTERIA- TOO NUMEROUS TO COUNT  CBC     Status: Abnormal   Collection Time: 05/08/14  7:05 PM  Result Value Ref Range   WBC 7.3 4.0 - 10.5 K/uL   RBC 3.79 (L) 3.87 - 5.11 MIL/uL   Hemoglobin 11.6 (L) 12.0 - 15.0 g/dL   HCT 13.034.0 (L) 86.536.0 - 78.446.0 %   MCV 89.7 78.0 - 100.0 fL   MCH 30.6 26.0 - 34.0 pg   MCHC 34.1 30.0 - 36.0 g/dL   RDW 69.612.9 29.511.5 - 28.415.5 %   Platelets 243 150 - 400 K/uL     Review of Systems  Constitutional: Negative for fever and chills.  Gastrointestinal: Positive for nausea and abdominal pain.  Genitourinary: Negative for dysuria, urgency, frequency and hematuria.       Denies vaginal bleeding   Musculoskeletal: Positive for back pain.   Physical Exam   Blood pressure 101/54, pulse 95, temperature 98.1 F (36.7 C), temperature source Oral, resp. rate 18, weight 62.143 kg (137 lb), last menstrual period 03/28/2014, unknown if currently breastfeeding.  Physical Exam  Constitutional: She is oriented to person, place, and time. She appears well-developed and well-nourished. No distress.  HENT:  Head: Normocephalic.  Eyes: Pupils are equal, round, and reactive to light.  Neck: Neck supple.  Respiratory: Effort normal.  GI: Soft. There is tenderness in the suprapubic area. There is no rigidity and no guarding.  Genitourinary:  Speculum exam: Vagina - Small amount of creamy discharge, strong odor Cervix - No contact bleeding Bimanual exam: Cervix closed, no CMT  Uterus non tender, normal size Adnexa non tender, no masses bilaterally GC/Chlam, wet prep done Chaperone present for exam.  Musculoskeletal: Normal range of motion.  Neurological: She is alert and oriented to person, place, and time.  Skin: Skin is warm. She is not diaphoretic.  Psychiatric: Her behavior is normal.   Results for orders placed or performed during the hospital encounter of 05/08/14 (from the past 24 hour(s))  Urinalysis, Routine w reflex  microscopic     Status: Abnormal   Collection Time: 05/08/14  5:45 PM  Result Value Ref Range   Color, Urine YELLOW YELLOW   APPearance CLEAR CLEAR   Specific Gravity, Urine 1.020 1.005 - 1.030   pH 6.0 5.0 - 8.0  Glucose, UA NEGATIVE NEGATIVE mg/dL   Hgb urine dipstick SMALL (A) NEGATIVE   Bilirubin Urine NEGATIVE NEGATIVE   Ketones, ur NEGATIVE NEGATIVE mg/dL   Protein, ur NEGATIVE NEGATIVE mg/dL   Urobilinogen, UA 0.2 0.0 - 1.0 mg/dL   Nitrite NEGATIVE NEGATIVE   Leukocytes, UA NEGATIVE NEGATIVE  Urine microscopic-add on     Status: Abnormal   Collection Time: 05/08/14  5:45 PM  Result Value Ref Range   Squamous Epithelial / LPF MANY (A) RARE   WBC, UA 0-2 <3 WBC/hpf   RBC / HPF 3-6 <3 RBC/hpf   Bacteria, UA FEW (A) RARE  Pregnancy, urine POC     Status: Abnormal   Collection Time: 05/08/14  5:50 PM  Result Value Ref Range   Preg Test, Ur POSITIVE (A) NEGATIVE  Wet prep, genital     Status: Abnormal   Collection Time: 05/08/14  6:45 PM  Result Value Ref Range   Yeast Wet Prep HPF POC NONE SEEN NONE SEEN   Trich, Wet Prep NONE SEEN NONE SEEN   Clue Cells Wet Prep HPF POC MODERATE (A) NONE SEEN   WBC, Wet Prep HPF POC MODERATE (A) NONE SEEN  CBC     Status: Abnormal   Collection Time: 05/08/14  7:05 PM  Result Value Ref Range   WBC 7.3 4.0 - 10.5 K/uL   RBC 3.79 (L) 3.87 - 5.11 MIL/uL   Hemoglobin 11.6 (L) 12.0 - 15.0 g/dL   HCT 69.6 (L) 29.5 - 28.4 %   MCV 89.7 78.0 - 100.0 fL   MCH 30.6 26.0 - 34.0 pg   MCHC 34.1 30.0 - 36.0 g/dL   RDW 13.2 44.0 - 10.2 %   Platelets 243 150 - 400 K/uL  hCG, quantitative, pregnancy     Status: Abnormal   Collection Time: 05/08/14  7:05 PM  Result Value Ref Range   hCG, Beta Chain, Quant, S 36390 (H) <5 mIU/mL   US Ob Comp Less 14 Wks  05/08/2014   CLINICAL DATA:  Cramping, low back pain, pregnant  EXAM: OBSTETRIC <14 WK [place] AND TRANSVAGINAL OB US  TECHNIQUE: Both transabdominal and transvaginal ultrasound examinations were  performed for complete evaluation of the gestation as well as the maternal uterus, adnexal regions, and pelvic cul-de-sac. Transvaginal technique was performed to assess early pregnancy.  COMPARISON:  None.  FINDINGS: Intrauterine gestational sac: Visualized/normal in shape.  Yolk sac:  Present  Embryo:  Not visualized  MSD: 14.8  mm   6 w   2  d  Maternal uterus/adnexae: No subchronic hemorrhage.  Right ovary is within normal limits, noting a 2.9 x 2.7 x 2.1 cm simple corpus luteal cyst.  Left ovary is within normal limits.  No free fluid.  IMPRESSION: Single intrauterine gestational sac with yolk sac, measuring 6 weeks 2 days by mean sac diameter. No fetal pole is visualized.  Serial beta HCG is suggested. Consider follow-up pelvic ultrasound in 10-14 days to confirm viability as clinically warranted.   Electronically Signed   By: Charline Bills M.D.   On: 05/08/2014 19:55   US Ob Transvaginal  05/08/2014   CLINICAL DATA:  Cramping, low back pain, pregnant  EXAM: OBSTETRIC <14 WK [place] AND TRANSVAGINAL OB US  TECHNIQUE: Both transabdominal and transvaginal ultrasound examinations were performed for complete evaluation of the gestation as well as the maternal uterus, adnexal regions, and pelvic cul-de-sac. Transvaginal technique was performed to assess early pregnancy.  COMPARISON:  None.  FINDINGS: Intrauterine gestational  sac: Visualized/normal in shape.  Yolk sac:  Present  Embryo:  Not visualized  MSD: 14.8  mm   6 w   2  d  Maternal uterus/adnexae: No subchronic hemorrhage.  Right ovary is within normal limits, noting a 2.9 x 2.7 x 2.1 cm simple corpus luteal cyst.  Left ovary is within normal limits.  No free fluid.  IMPRESSION: Single intrauterine gestational sac with yolk sac, measuring 6 weeks 2 days by mean sac diameter. No fetal pole is visualized.  Serial beta HCG is suggested. Consider follow-up pelvic ultrasound in 10-14 days to confirm viability as clinically warranted.   Electronically Signed    By: Charline Bills M.D.   On: 05/08/2014 19:55     MAU Course  Procedures  None  MDM  Wet prep GC CBC  [place]  Urine culture pending   Report given to Thressa Sheller CNM who resumes care of the patient.    Assessment and Plan  A:  1. Abdominal pain in pregnancy, antepartum   2. BV (bacterial vaginosis)    P:  DC home RX flagyl for BV Phenergan for nausea/vomiting First trimester precautions reviewed List of OB providers given Pregnancy verification letter given List of safe meds given  Follow-up Information    Follow up with [facility] [place].   Contact information:   [address] [facility] [place] [address] [phone]     Tawnya Crook  8:13 PM 05/08/2014    Duane Lope, NP 05/08/2014 6:44 PM

## 2014-05-08 NOTE — MAU Note (Addendum)
Was on antibiotics for yeast infection and BV, stopped when she had + preg test.  +left CVA tenderness

## 2014-05-09 LAB — URINE CULTURE
Colony Count: 25000
Special Requests: NORMAL

## 2014-05-09 LAB — GC/CHLAMYDIA PROBE AMP (~~LOC~~) NOT AT ARMC
Chlamydia: NEGATIVE
Neisseria Gonorrhea: NEGATIVE

## 2014-05-09 LAB — HIV ANTIBODY (ROUTINE TESTING W REFLEX): HIV Screen 4th Generation wRfx: NONREACTIVE

## 2014-05-15 ENCOUNTER — Observation Stay (HOSPITAL_COMMUNITY)
Admission: EM | Admit: 2014-05-15 | Discharge: 2014-05-17 | Disposition: A | Discharge status: Home / Self Care | Payer: Medicaid Other | Source: Intra-hospital | Attending: Psychiatry | Admitting: Psychiatry

## 2014-05-15 ENCOUNTER — Inpatient Hospital Stay (HOSPITAL_COMMUNITY): Payer: Medicaid Other

## 2014-05-15 ENCOUNTER — Encounter (HOSPITAL_COMMUNITY): Payer: Self-pay | Admitting: *Deleted

## 2014-05-15 ENCOUNTER — Inpatient Hospital Stay (HOSPITAL_COMMUNITY)
Admission: AD | Admit: 2014-05-15 | Discharge: 2014-05-15 | Disposition: A | Discharge status: Other Acute Inpatient Hospital | Payer: Medicaid Other | Source: Ambulatory Visit | Attending: Obstetrics & Gynecology | Admitting: Obstetrics & Gynecology

## 2014-05-15 DIAGNOSIS — Z87891 Personal history of nicotine dependence: Secondary | ICD-10-CM | POA: Diagnosis not present

## 2014-05-15 DIAGNOSIS — F332 Major depressive disorder, recurrent severe without psychotic features: Secondary | ICD-10-CM | POA: Insufficient documentation

## 2014-05-15 DIAGNOSIS — F319 Bipolar disorder, unspecified: Secondary | ICD-10-CM | POA: Insufficient documentation

## 2014-05-15 DIAGNOSIS — Z3A01 Less than 8 weeks gestation of pregnancy: Secondary | ICD-10-CM | POA: Diagnosis not present

## 2014-05-15 DIAGNOSIS — Z91040 Latex allergy status: Secondary | ICD-10-CM | POA: Insufficient documentation

## 2014-05-15 DIAGNOSIS — R45851 Suicidal ideations: Secondary | ICD-10-CM | POA: Insufficient documentation

## 2014-05-15 DIAGNOSIS — O210 Mild hyperemesis gravidarum: Secondary | ICD-10-CM | POA: Diagnosis not present

## 2014-05-15 DIAGNOSIS — Z880 Allergy status to penicillin: Secondary | ICD-10-CM | POA: Diagnosis not present

## 2014-05-15 DIAGNOSIS — E109 Type 1 diabetes mellitus without complications: Secondary | ICD-10-CM

## 2014-05-15 DIAGNOSIS — O98811 Other maternal infectious and parasitic diseases complicating pregnancy, first trimester: Secondary | ICD-10-CM | POA: Diagnosis not present

## 2014-05-15 DIAGNOSIS — R109 Unspecified abdominal pain: Secondary | ICD-10-CM | POA: Diagnosis present

## 2014-05-15 DIAGNOSIS — O26891 Other specified pregnancy related conditions, first trimester: Secondary | ICD-10-CM | POA: Diagnosis not present

## 2014-05-15 DIAGNOSIS — I10 Essential (primary) hypertension: Secondary | ICD-10-CM | POA: Insufficient documentation

## 2014-05-15 DIAGNOSIS — R443 Hallucinations, unspecified: Secondary | ICD-10-CM

## 2014-05-15 DIAGNOSIS — R319 Hematuria, unspecified: Secondary | ICD-10-CM | POA: Diagnosis not present

## 2014-05-15 DIAGNOSIS — O9989 Other specified diseases and conditions complicating pregnancy, childbirth and the puerperium: Secondary | ICD-10-CM | POA: Insufficient documentation

## 2014-05-15 LAB — CBC WITH DIFFERENTIAL/PLATELET
Basophils Absolute: 0 10*3/uL (ref 0.0–0.1)
Basophils Relative: 0 % (ref 0–1)
Eosinophils Absolute: 0.1 10*3/uL (ref 0.0–0.7)
Eosinophils Relative: 1 % (ref 0–5)
HCT: 36.6 % (ref 36.0–46.0)
Hemoglobin: 13.3 g/dL (ref 12.0–15.0)
Lymphocytes Relative: 21 % (ref 12–46)
Lymphs Abs: 2 10*3/uL (ref 0.7–4.0)
MCH: 31.3 pg (ref 26.0–34.0)
MCHC: 36.3 g/dL — ABNORMAL HIGH (ref 30.0–36.0)
MCV: 86.1 fL (ref 78.0–100.0)
Monocytes Absolute: 0.5 10*3/uL (ref 0.1–1.0)
Monocytes Relative: 6 % (ref 3–12)
Neutro Abs: 6.8 10*3/uL (ref 1.7–7.7)
Neutrophils Relative %: 72 % (ref 43–77)
Platelets: 270 10*3/uL (ref 150–400)
RBC: 4.25 MIL/uL (ref 3.87–5.11)
RDW: 12.6 % (ref 11.5–15.5)
WBC: 9.4 10*3/uL (ref 4.0–10.5)

## 2014-05-15 LAB — URINE MICROSCOPIC-ADD ON

## 2014-05-15 LAB — URINALYSIS, ROUTINE W REFLEX MICROSCOPIC
Glucose, UA: NEGATIVE mg/dL
Ketones, ur: 40 mg/dL — AB
Leukocytes, UA: NEGATIVE
Nitrite: NEGATIVE
Protein, ur: NEGATIVE mg/dL
Specific Gravity, Urine: 1.025 (ref 1.005–1.030)
Urobilinogen, UA: 1 mg/dL (ref 0.0–1.0)
pH: 6.5 (ref 5.0–8.0)

## 2014-05-15 LAB — COMPREHENSIVE METABOLIC PANEL
ALT: 12 U/L — ABNORMAL LOW (ref 14–54)
AST: 17 U/L (ref 15–41)
Albumin: 4.1 g/dL (ref 3.5–5.0)
Alkaline Phosphatase: 46 U/L (ref 38–126)
Anion gap: 8 (ref 5–15)
BUN: 10 mg/dL (ref 6–20)
CO2: 21 mmol/L — ABNORMAL LOW (ref 22–32)
Calcium: 9.1 mg/dL (ref 8.9–10.3)
Chloride: 103 mmol/L (ref 101–111)
Creatinine, Ser: 0.6 mg/dL (ref 0.44–1.00)
GFR calc Af Amer: >60 mL/min (ref >60)
GFR calc non Af Amer: >60 mL/min (ref >60)
Glucose, Bld: 118 mg/dL — ABNORMAL HIGH (ref 70–99)
Potassium: 3.5 mmol/L (ref 3.5–5.1)
Sodium: 132 mmol/L — ABNORMAL LOW (ref 135–145)
Total Bilirubin: 0.6 mg/dL (ref 0.3–1.2)
Total Protein: 7.3 g/dL (ref 6.5–8.1)

## 2014-05-15 LAB — LIPASE, BLOOD: Lipase: 23 U/L (ref 22–51)

## 2014-05-15 LAB — TSH: TSH: 0.292 u[IU]/mL — ABNORMAL LOW (ref 0.350–4.500)

## 2014-05-15 LAB — RAPID URINE DRUG SCREEN, HOSP PERFORMED
Amphetamines: NOT DETECTED
Barbiturates: NOT DETECTED
Benzodiazepines: NOT DETECTED
Cocaine: NOT DETECTED
Opiates: NOT DETECTED
Tetrahydrocannabinol: POSITIVE — AB

## 2014-05-15 LAB — AMYLASE: Amylase: 103 U/L — ABNORMAL HIGH (ref 28–100)

## 2014-05-15 MED ORDER — HYDROMORPHONE HCL 1 MG/ML IJ SOLN
0.5000 mg | Freq: Once | INTRAMUSCULAR | Status: AC
  Administered 2014-05-15: 0.5 mg via INTRAVENOUS
  Filled 2014-05-15: qty 1

## 2014-05-15 MED ORDER — MAGNESIUM HYDROXIDE 400 MG/5ML PO SUSP: 30.0000 mL | Freq: Every day | ORAL | Status: DC | PRN

## 2014-05-15 MED ORDER — SODIUM CHLORIDE 0.9 % IV SOLN
25.0000 mg | Freq: Once | INTRAVENOUS | Status: AC
  Administered 2014-05-15: 25 mg via INTRAVENOUS
  Filled 2014-05-15: qty 1

## 2014-05-15 MED ORDER — DIPHENHYDRAMINE HCL 50 MG PO CAPS: 50.0000 mg | ORAL_CAPSULE | Freq: Every evening | ORAL | Status: DC | PRN

## 2014-05-15 MED ORDER — ACETAMINOPHEN 650 MG RE SUPP
650.0000 mg | Freq: Once | RECTAL | Status: AC
  Administered 2014-05-15: 650 mg via RECTAL
  Filled 2014-05-15: qty 1

## 2014-05-15 MED ORDER — OXYCODONE-ACETAMINOPHEN 5-325 MG PO TABS
2.0000 | ORAL_TABLET | Freq: Once | ORAL | Status: AC
  Administered 2014-05-15: 2 via ORAL
  Filled 2014-05-15: qty 2

## 2014-05-15 MED ORDER — ACETAMINOPHEN 325 MG PO TABS
650.0000 mg | ORAL_TABLET | Freq: Four times a day (QID) | ORAL | Status: DC | PRN
  Administered 2014-05-16: 650 mg via ORAL
  Filled 2014-05-15: qty 2

## 2014-05-15 MED ORDER — ALUM & MAG HYDROXIDE-SIMETH 200-200-20 MG/5ML PO SUSP: 30.0000 mL | ORAL | Status: DC | PRN

## 2014-05-15 NOTE — MAU Note (Signed)
Pt presents to MAU with complaints of nausea, vomiting and lower abdominal pain for 3 days. States she was evaluated here on the 25th and was given medication for BV but is not able to keep that down. Denies any vaginal bleeding

## 2014-05-15 NOTE — MAU Note (Signed)
TTS assessment taking place right now in room via telepsych service.

## 2014-05-15 NOTE — Progress Notes (Signed)
[facility] INPATIENT:  Family/Significant Other Suicide Prevention Education  Suicide Prevention Education:  Patient Refusal for Family/Significant Other Suicide Prevention Education: The patient Rachel Lloyd has refused to provide written consent for family/significant other to be provided Family/Significant Other Suicide Prevention Education during admission and/or prior to discharge.  Physician notified.  Celene KrasRobinson, Kendel Bessey G 05/15/2014, 11:17 PM

## 2014-05-15 NOTE — MAU Note (Signed)
States that she is spitting because she "can't swallow her spit"; states that she has been cramping in her lower abdomen and lower back for past week; has not been able to take her antibiotics that she was prescribed at last visit to MAU; states that she has had 3 terminations due to hyperemesis;

## 2014-05-15 NOTE — MAU Note (Signed)
TTS called to report pt's information and will call back and perform assessment.

## 2014-05-15 NOTE — Progress Notes (Signed)
[facility] Observation Crisis Plan  Reason for Crisis Plan:  Crisis Stabilization   Plan of Care:  Outpatient referral  Family Support:    Lottie DawsonFatima Hayes mother [phone]  Current Living Environment:  Living Arrangements: Alone, Children  Insurance:   Hospital Account    Name Acct ID Class Status Primary Coverage   Lisabeth DevoidWelch, Natayla D [id number] BEHAVIORAL HEALTH OBSERVATION Open None        Guarantor Account (for Hospital Account [id number])    Name Relation to Pt Service Area Active? Acct Type   Lisabeth DevoidWelch, Jaeanna D [facility]   Address Phone       [address] [place] [phone](H)          Coverage Information (for Hospital Account [id number])    Not on file      Legal Guardian:     Primary Care Provider:  Joanna Pufforsey, Crystal S, MD  Current Outpatient Providers:  none  Psychiatrist:   none  Counselor/Therapist:   none  Compliant with Medications:  none  Additional Information:   Celene KrasRobinson, Karmon Andis G 5/2/201611:17 PM

## 2014-05-15 NOTE — MAU Provider Note (Signed)
O: Spoke with behavioral health pt is to be admitted for 24hr obs to their unit. A: N and V of preg with suicide ideations P: transfer to behavioral health
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
## 2014-05-16 ENCOUNTER — Encounter (HOSPITAL_COMMUNITY): Payer: Self-pay | Admitting: Advanced Practice Midwife

## 2014-05-16 DIAGNOSIS — R7989 Other specified abnormal findings of blood chemistry: Secondary | ICD-10-CM | POA: Insufficient documentation

## 2014-05-16 DIAGNOSIS — R45851 Suicidal ideations: Secondary | ICD-10-CM

## 2014-05-16 DIAGNOSIS — O26891 Other specified pregnancy related conditions, first trimester: Secondary | ICD-10-CM | POA: Diagnosis not present

## 2014-05-16 DIAGNOSIS — F332 Major depressive disorder, recurrent severe without psychotic features: Secondary | ICD-10-CM

## 2014-05-16 DIAGNOSIS — R748 Abnormal levels of other serum enzymes: Secondary | ICD-10-CM | POA: Insufficient documentation
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
## 2014-05-16 NOTE — Progress Notes (Signed)
[facility] Assessment Progress Note  [facility] for [facility].   Marcelle SmilingSamantha Merline Perkin, MS Counselor

## 2014-05-16 NOTE — Progress Notes (Signed)
Patient ID: Rachel Lloyd, female   DOB: 1985/11/13, 29 y.o.   MRN: [id number] Patient up this AM vomiting.  Stated that she has not been eating for several days due to nausea brought on by pregnancy. States she is [redacted] weeks pregnant and does not want to be pregnant. Stated she has been pregnant since 2013 and is done having children as she has 3 already. States that she wanted her tubes tied last time but that the MD did not do it because it wasn't scheduled. Patient reported she had post partum depression with last baby.  Patient states she is not suicidal. Also denies HI and AVH. Patient is depressed with flat affect. Patient had several conversations with female on the phone. Patient able to eat cereal and milk this AM without vomiting. Will continue to monitor patient for safety and will encourage patient to eat and drink fluids.

## 2014-05-16 NOTE — Progress Notes (Signed)
Patient ID: Rachel Lloyd, female   DOB: Dec 25, 1985, 29 y.o.   MRN: [id number] Patient continues to complain of nausea and is vomiting.  Continues to complain of left flank pain. Heat pack provided.

## 2014-05-16 NOTE — Progress Notes (Signed)
  [facility] Assessment Progress Note   Counselor spoke with pt about discharge planning. Pt indicates that she has stable housing and steady income. She says that she has a car, but is not sure who has it, at present. She denies SI/HI/AVH, but maintains a flat affect. When asked about help she thinks she needs, pt is unsure what she needs, but does agree that she needs help. She verifies that she has PPD and Bipolar disorder and is not taking any medications to help mitigate symptoms. She also indicates that her symptoms have gotten worse since becoming pregnant.   Pt admits to having a chaotic home life at present with no plan or idea of how to fix the problems. She admits to the problems including certain people in her life, but did not appear willing to elaborate. Pt says that [facility] help her stabilize her mood.   Marcelle SmilingSamantha Laydon Martis, MS Counselor

## 2014-05-16 NOTE — Progress Notes (Addendum)
Pt presents to Observation Unit alert and cooperative. Pt adamantly denies SI/HI, verbally contracts for safety. Pt reports being nauseated and having a 'bad' taste in her mouth causing her to not eat adequately since pregnant. Currently denies A/[place]. Reported history of 3 abortions, feelings of guilt and hearing voice of  baby crying. Pt does have 3 children and is presently [redacted] weeks pregnant. Pt admits to depression, anxiety, worrying, feeling hopeless and helpless. Reported stressors are being a single mother, arguing with boyfriend over women, working 2 jobs and being pregnant again. Emotional support and encouragement given. Will monitor closely and evaluate for stabilization.

## 2014-05-16 NOTE — Progress Notes (Signed)
B.Demontrez Rindfleisch, MHT providing monitoring while on OB's unit she was awake and oriented during this observation. She complains of morning sickness due to pregnancy. She was unable to hold down her breakfast and requested gator aide. After words she requested frosted flakes which was provided but did not eat all of it. She denies SI, HI, no symptoms of psychosis present. State she does not why she is here. She was given access to phone to make contact with family and has been sleeping since off and on. Will remain continue monitoring.

## 2014-05-17 DIAGNOSIS — O26891 Other specified pregnancy related conditions, first trimester: Secondary | ICD-10-CM | POA: Diagnosis not present
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
## 2014-05-17 NOTE — BHH Counselor (Addendum)
Writer gave pt info re: [facility] and encouraged pt to go to the agency early in am in order to get walk in screening appt.  Rachel Lloyd, [place] Therapeutic Triage Specialist   Writer spoke with pt. Pt [facility] [place] [facility] at 8:30 am. Pt says this was the first available appt. Pt then asks Clinical research associatewriter for info re: housing in [place], so she could move to a safer location away from "an individual". Writer provides pt with housing info obtained from [employer] website. Pt sts she has time to think while in OBS unit and realized she needs to stay away from that certain individual.  Rachel Lloyd, [facility] Hospital[facility]WA Therapeutic Triage Specialist [phone]

## 2014-05-17 NOTE — Progress Notes (Signed)
Pt alert and cooperative. Affect/ mood sad and depressed. Denies SI/HI, verbally contracts for safety. -A/[place]. Pt continues to complain of left flank pain and heat pack given per request. Pt made multiple calls to boyfriend and family to check on her children. Emotional support and encouragement given. Will continue to monitor closely and evaluate for stabilization.

## 2014-05-17 NOTE — Progress Notes (Signed)
Patient ID: Rachel Lloyd, female   DOB: [birth year], 29 y.o.   MRN: [id number]   Writer reviewed pt discharge instructions with pt including follow up care and crisis intervention. Pt acknowledged understanding of instructions and states that she has no reservations [facility] at this [facility]. Pt denies SI/HI and AVH. Pt mood and affect are appropriate to the situation. Writer returned pt belongings from locker, and the pt is released into her own care.

## 2014-05-17 NOTE — Progress Notes (Signed)
Rachel Lloyd is a 29 y.o. female patient. No diagnosis found. Past Medical History  Diagnosis Date  . Mental disorder 2010    bipolar  . Scabies exposure 2012    treated july 2012 & reexposed  . Hypertension     During second pregnancy  . Pneumonia   . Chlamydia    Current Facility-Administered Medications  Medication Dose Route Frequency Provider Last Rate Last Dose  . acetaminophen (TYLENOL) tablet 650 mg  650 mg Oral Q6H PRN Kerry HoughSpencer E Simon, PA-C   650 mg at 05/16/14 1157  . alum & mag hydroxide-simeth (MAALOX/MYLANTA) 200-200-20 MG/5ML suspension 30 mL  30 mL Oral Q4H PRN Kerry HoughSpencer E Simon, PA-C      . diphenhydrAMINE (BENADRYL) capsule 50 mg  50 mg Oral QHS PRN Kerry HoughSpencer E Simon, PA-C      . magnesium hydroxide (MILK OF MAGNESIA) suspension 30 mL  30 mL Oral Daily PRN Kerry HoughSpencer E Simon, PA-C       Allergies  Allergen Reactions  . Penicillins Anaphylaxis  . Latex Itching and Rash   Principal Problem:   MDD (major depressive disorder), recurrent severe, without psychosis  Blood pressure 125/77, pulse 74, temperature 98.6 F (37 C), temperature source Oral, resp. rate 18, height 5' (1.524 m), weight 61.236 kg (135 lb), last menstrual period 03/28/2014, unknown if currently breastfeeding.  Subjective Pt is awake in bed this AM. Pt mood and affect are appropriate and she is open and conversational with staff. Objective Pt denies SI/HI and AVH and has eaten 2 hardboiled eggs and yogurt without NV. Pt states that she wants to go home and [facility].They will also provide pt with a referal for tuboligation. Pt has made several phone calls and she has a calm attitude and demeanor Assessment & Plan In progress  Shaarav Ripple Shari Prowsvan 05/17/2014

## 2014-05-23 ENCOUNTER — Telehealth: Payer: Self-pay | Admitting: *Deleted

## 2014-05-23 NOTE — Telephone Encounter (Signed)
-----   Message from Joanna Puffrystal S Dorsey, MD sent at 05/22/2014  9:31 AM EDT ----- Can you please contact this patient and have her schedule and appt with me.  Thanks, [employer] Corporation[employer]rystal Dorsey

## 2014-05-23 NOTE — Telephone Encounter (Signed)
Tried calling patient... All numbers in chart currently disconnected. Will send letter.

## 2014-09-01 ENCOUNTER — Inpatient Hospital Stay (HOSPITAL_COMMUNITY)
Admission: AD | Admit: 2014-09-01 | Discharge: 2014-09-01 | Disposition: A | Discharge status: Home / Self Care | Payer: Medicaid Other | Source: Ambulatory Visit | Attending: Family Medicine | Admitting: Family Medicine

## 2014-09-01 ENCOUNTER — Encounter (HOSPITAL_COMMUNITY): Payer: Self-pay

## 2014-09-01 DIAGNOSIS — N3000 Acute cystitis without hematuria: Secondary | ICD-10-CM | POA: Diagnosis not present

## 2014-09-01 DIAGNOSIS — F319 Bipolar disorder, unspecified: Secondary | ICD-10-CM | POA: Insufficient documentation

## 2014-09-01 DIAGNOSIS — B9689 Other specified bacterial agents as the cause of diseases classified elsewhere: Secondary | ICD-10-CM

## 2014-09-01 DIAGNOSIS — O26892 Other specified pregnancy related conditions, second trimester: Secondary | ICD-10-CM | POA: Insufficient documentation

## 2014-09-01 DIAGNOSIS — N760 Acute vaginitis: Secondary | ICD-10-CM | POA: Diagnosis not present

## 2014-09-01 DIAGNOSIS — Z3A22 22 weeks gestation of pregnancy: Secondary | ICD-10-CM | POA: Insufficient documentation

## 2014-09-01 DIAGNOSIS — A499 Bacterial infection, unspecified: Secondary | ICD-10-CM

## 2014-09-01 LAB — URINALYSIS, ROUTINE W REFLEX MICROSCOPIC
Bilirubin Urine: NEGATIVE
Glucose, UA: NEGATIVE mg/dL
Ketones, ur: NEGATIVE mg/dL
Nitrite: POSITIVE — AB
Protein, ur: NEGATIVE mg/dL
Specific Gravity, Urine: 1.02 (ref 1.005–1.030)
Urobilinogen, UA: 0.2 mg/dL (ref 0.0–1.0)
pH: 5.5 (ref 5.0–8.0)

## 2014-09-01 LAB — WET PREP, GENITAL
Trich, Wet Prep: NONE SEEN
Yeast Wet Prep HPF POC: NONE SEEN

## 2014-09-01 LAB — POCT PREGNANCY, URINE: Preg Test, Ur: NEGATIVE

## 2014-09-01 LAB — URINE MICROSCOPIC-ADD ON

## 2014-09-01 LAB — CBC
HCT: 39.4 % (ref 36.0–46.0)
Hemoglobin: 13.1 g/dL (ref 12.0–15.0)
MCH: 30.3 pg (ref 26.0–34.0)
MCHC: 33.2 g/dL (ref 30.0–36.0)
MCV: 91.2 fL (ref 78.0–100.0)
Platelets: 255 10*3/uL (ref 150–400)
RBC: 4.32 MIL/uL (ref 3.87–5.11)
RDW: 12.8 % (ref 11.5–15.5)
WBC: 9.8 10*3/uL (ref 4.0–10.5)

## 2014-09-01 MED ORDER — IBUPROFEN 600 MG PO TABS: 600.0000 mg | ORAL_TABLET | Freq: Four times a day (QID) | ORAL | Status: DC | PRN

## 2014-09-01 MED ORDER — CIPROFLOXACIN HCL 500 MG PO TABS: 500.0000 mg | ORAL_TABLET | Freq: Two times a day (BID) | ORAL | Status: DC

## 2014-09-01 MED ORDER — KETOROLAC TROMETHAMINE 60 MG/2ML IM SOLN
60.0000 mg | Freq: Once | INTRAMUSCULAR | Status: AC
  Administered 2014-09-01: 60 mg via INTRAMUSCULAR
  Filled 2014-09-01: qty 2

## 2014-09-01 MED ORDER — METRONIDAZOLE 500 MG PO TABS: 500.0000 mg | ORAL_TABLET | Freq: Two times a day (BID) | ORAL | Status: DC

## 2014-09-01 NOTE — MAU Note (Signed)
Pt states here for lower abd pain that radiates down her legs. Took abortion pill 06/07/2014, did not bleed a lot. Saw two larger clots at that time. Unsure if she is pregnant again. Took home upt's that were negative. Having dark colored odorous discharge since past 3 days. Pain has increased past 2 days.

## 2014-09-01 NOTE — Discharge Instructions (Signed)
Urinary Tract Infection A urinary tract infection (UTI) can occur any place along the urinary tract. The tract includes the kidneys, ureters, bladder, and urethra. A type of germ called bacteria often causes a UTI. UTIs are often helped with antibiotic medicine.  HOME CARE   If given, take antibiotics as told by your doctor. Finish them even if you start to feel better.  Drink enough fluids to keep your pee (urine) clear or pale yellow.  Avoid tea, drinks with caffeine, and bubbly (carbonated) drinks.  Pee often. Avoid holding your pee in for a long time.  Pee before and after having sex (intercourse).  Wipe from front to back after you poop (bowel movement) if you are a woman. Use each tissue only once. GET HELP RIGHT AWAY IF:   You have back pain.  You have lower belly (abdominal) pain.  You have chills.  You feel sick to your stomach (nauseous).  You throw up (vomit).  Your burning or discomfort with peeing does not go away.  You have a fever.  Your symptoms are not better in 3 days. MAKE SURE YOU:   Understand these instructions.  Will watch your condition.  Will get help right away if you are not doing well or get worse. Document Released: 06/18/2007 Document Revised: 09/24/2011 Document Reviewed: 07/31/2011 [facility] Patient Information 2015 [place], [place]. This information is not intended to replace advice given to you by your health care provider. Make sure you discuss any questions you have with your health care provider.   Antibiotic Medication Antibiotic medicine helps fight germs. Germs cause infections. This type of medicine will not work for colds, flu, or other viral infections. Tell your doctor if you:  Are allergic to any medicines.  Are pregnant or are trying to get pregnant.  Are taking other medicines.  Have other medical problems. HOME CARE  Take your medicine with a glass of water or food as told by your doctor.  Take the medicine as  told. Finish them even if you start to feel better.  Do not give your medicine to other people.  Do not use your medicine in the future for a different infection.  Ask your doctor about which side effects to watch for.  Try not to miss any doses. If you miss a dose, take it as soon as possible. If it is almost time for your next dose, and your dosing schedule is:  Two doses a day, take the missed dose and the next dose 5 to 6 hours later.  Three or more doses a day, take the missed dose and the next dose 2 to 4 hours later, or double your next dose.  Then go back to your normal schedule. GET HELP RIGHT AWAY IF:   You get worse or do not get better within a few days.  The medicine makes you sick.  You develop a rash or any other side effects.  You have questions or concerns. MAKE SURE YOU:  Understand these instructions.  Will watch your condition.  Will get help right away if you are not doing well or get worse. Document Released: 10/09/2007 Document Revised: 03/24/2011 Document Reviewed: 12/05/2008 [facility] Patient Information 2015 [place], [place]. This information is not intended to replace advice given to you by your health care provider. Make sure you discuss any questions you have with your health care provider.     Bacterial Vaginosis Bacterial vaginosis is a vaginal infection that occurs when the normal balance of bacteria in  the vagina is disrupted. It results from an overgrowth of certain bacteria. This is the most common vaginal infection in women of childbearing age. Treatment is important to prevent complications, especially in pregnant women, as it can cause a premature delivery. CAUSES  Bacterial vaginosis is caused by an increase in harmful bacteria that are normally present in smaller amounts in the vagina. Several different kinds of bacteria can cause bacterial vaginosis. However, the reason that the condition develops is not fully understood. RISK  FACTORS Certain activities or behaviors can put you at an increased risk of developing bacterial vaginosis, including:  Having a new sex partner or multiple sex partners.  Douching.  Using an intrauterine device (IUD) for contraception. Women do not get bacterial vaginosis from toilet seats, bedding, swimming pools, or contact with objects around them. SIGNS AND SYMPTOMS  Some women with bacterial vaginosis have no signs or symptoms. Common symptoms include:  Grey vaginal discharge.  A fishlike odor with discharge, especially after sexual intercourse.  Itching or burning of the vagina and vulva.  Burning or pain with urination. DIAGNOSIS  Your health care provider will take a medical history and examine the vagina for signs of bacterial vaginosis. A sample of vaginal fluid may be taken. Your health care provider will look at this sample under a microscope to check for bacteria and abnormal cells. A vaginal pH test may also be done.  TREATMENT  Bacterial vaginosis may be treated with antibiotic medicines. These may be given in the form of a pill or a vaginal cream. A second round of antibiotics may be prescribed if the condition comes back after treatment.  HOME CARE INSTRUCTIONS   Only take over-the-counter or prescription medicines as directed by your health care provider.  If antibiotic medicine was prescribed, take it as directed. Make sure you finish it even if you start to feel better.  Do not have sex until treatment is completed.  Tell all sexual partners that you have a vaginal infection. They should see their health care provider and be treated if they have problems, such as a mild rash or itching.  Practice safe sex by using condoms and only having one sex partner. SEEK MEDICAL CARE IF:   Your symptoms are not improving after 3 days of treatment.  You have increased discharge or pain.  You have a fever. MAKE SURE YOU:   Understand these instructions.  Will watch  your condition.  Will get help right away if you are not doing well or get worse. FOR MORE INFORMATION  Centers for Disease Control and Prevention, Division of STD Prevention: [employer] American Sexual Health Association (ASHA): www.ashastd.org  Document Released: 12/30/2004 Document Revised: 10/20/2012 Document Reviewed: 08/11/2012 [facility] Patient Information 2015 [place], [place]. This information is not intended to replace advice given to you by your health care provider. Make sure you discuss any questions you have with your health care provider.

## 2014-09-01 NOTE — MAU Provider Note (Signed)
History     CSN: [id number]  Arrival date and time: 09/01/14 1059   First Provider Initiated Contact with Patient 09/01/14 1209      Chief Complaint  Patient presents with  . Abdominal Pain  . Vaginal Odor   . Vaginal Discharge   HPI Rachel Lloyd is a 29 y.o. [id number] at [redacted]w[redacted]d who presents with lower abdominal pain and vaginal discharge.  -Lower abdominal pain started 2 days ago. Describes as sharp & cramp like, radiates to upper thighs. Rates 10/10. Took tylenol with no relief. Reports cloudy urine with a "UTI odor". Denies flank pain, fever, hematuria.  -Vaginal discharge x 2 months. Foul odor worsening over time. Some vaginal itching. Discharge has been white & brown at times. Denies dyspareunia.    Past Medical History  Diagnosis Date  . Mental disorder 2010    bipolar  . Scabies exposure 2012    treated july 2012 & reexposed  . Hypertension     During second pregnancy  . Pneumonia   . Chlamydia     Past Surgical History  Procedure Laterality Date  . Mouth surgery      as a child  . Wisdom tooth extraction    . Induced abortion      Family History  Problem Relation Age of Onset  . Anesthesia problems Neg Hx   . Hypertension Mother   . Cancer Mother   . Fibroids Mother   . ADD / ADHD Brother   . Hypertension Maternal Grandmother     Social History  Substance Use Topics  . Smoking status: Former Smoker    Types: Cigarettes    Quit date: 04/04/2010  . Smokeless tobacco: Never Used  . Alcohol Use: No     Comment: quit when found out was pregnant    Allergies:  Allergies  Allergen Reactions  . Penicillins Anaphylaxis  . Latex Itching and Rash    Prescriptions prior to admission  Medication Sig Dispense Refill Last Dose  . acetaminophen (TYLENOL) 325 MG tablet Take 325 mg by mouth every 6 (six) hours as needed for moderate pain.   08/31/2014 at Unknown time    Review of Systems  Constitutional: Negative.  Negative for fever and chills.   Gastrointestinal: Positive for nausea and abdominal pain. Negative for vomiting, diarrhea and constipation.  Genitourinary: Positive for dysuria. Negative for hematuria and flank pain.       Positive - vaginal discharge Negative - vaginal bleeding Denies dyspareunia    I have reviewed the past Medical Hx, Surgical Hx, Social Hx, Allergies and Medications.   Physical Exam   Blood pressure 113/74, pulse 80, resp. rate 16, height 5\' 1"  (1.549 m), weight 127 lb 9.6 oz (57.879 kg), last menstrual period 03/28/2014, unknown if currently breastfeeding.  Physical Exam  Constitutional: She is oriented to person, place, and time. She appears well-developed and well-nourished. No distress.  HENT:  Head: Normocephalic and atraumatic.  Cardiovascular: Normal rate, regular rhythm and normal heart sounds.   No murmur heard. Respiratory: Effort normal and breath sounds normal. No respiratory distress. She has no wheezes.  GI: Soft. Bowel sounds are normal. She exhibits no distension and no mass. There is tenderness (LLQ with deep palpation). There is no rebound, no guarding and no CVA tenderness.  Genitourinary: Uterus normal. Cervix exhibits discharge. Cervix exhibits no motion tenderness. Right adnexum displays no mass, no tenderness and no fullness. Left adnexum displays no mass, no tenderness and no fullness. Vaginal discharge found.  Moderate amount of thin whitish discharge. Strong odor.   Neurological: She is alert and oriented to person, place, and time.  Skin: Skin is warm and dry. She is not diaphoretic.  Psychiatric: She has a normal mood and affect. Her behavior is normal. Judgment and thought content normal.    MAU Course  Procedures Results for orders placed or performed during the hospital encounter of 09/01/14 (from the past 24 hour(s))  Urinalysis, Routine w reflex microscopic (not at [facility])     Status: Abnormal   Collection Time: 09/01/14 11:20 AM  Result Value Ref Range   Color,  Urine YELLOW YELLOW   APPearance CLOUDY (A) CLEAR   Specific Gravity, Urine 1.020 1.005 - 1.030   pH 5.5 5.0 - 8.0   Glucose, UA NEGATIVE NEGATIVE mg/dL   Hgb urine dipstick LARGE (A) NEGATIVE   Bilirubin Urine NEGATIVE NEGATIVE   Ketones, ur NEGATIVE NEGATIVE mg/dL   Protein, ur NEGATIVE NEGATIVE mg/dL   Urobilinogen, UA 0.2 0.0 - 1.0 mg/dL   Nitrite POSITIVE (A) NEGATIVE   Leukocytes, UA LARGE (A) NEGATIVE  Urine microscopic-add on     Status: Abnormal   Collection Time: 09/01/14 11:20 AM  Result Value Ref Range   Squamous Epithelial / LPF MANY (A) RARE   WBC, UA 21-50 <3 WBC/hpf   Bacteria, UA FEW (A) RARE  Pregnancy, urine POC     Status: None   Collection Time: 09/01/14 12:01 PM  Result Value Ref Range   Preg Test, Ur NEGATIVE NEGATIVE  CBC     Status: None   Collection Time: 09/01/14 12:24 PM  Result Value Ref Range   WBC 9.8 4.0 - 10.5 K/uL   RBC 4.32 3.87 - 5.11 MIL/uL   Hemoglobin 13.1 12.0 - 15.0 g/dL   HCT 16.1 09.6 - 04.5 %   MCV 91.2 78.0 - 100.0 fL   MCH 30.3 26.0 - 34.0 pg   MCHC 33.2 30.0 - 36.0 g/dL   RDW 40.9 81.1 - 91.4 %   Platelets 255 150 - 400 K/uL  Wet prep, genital     Status: Abnormal   Collection Time: 09/01/14  1:10 PM  Result Value Ref Range   Yeast Wet Prep HPF POC NONE SEEN NONE SEEN   Trich, Wet Prep NONE SEEN NONE SEEN   Clue Cells Wet Prep HPF POC FEW (A) NONE SEEN   WBC, Wet Prep HPF POC MODERATE (A) NONE SEEN    MDM Pain relieved with toradol IM GC/CT, wet prep, CBC, HIV UPT negative Assessment and Plan  A: 1. Acute cystitis without hematuria   2. Bacterial vaginosis    P: Discharge home in stable condition Rx flagyl, cipro, & ibuprofen; pt is self pay Discussed reasons to return to MAU or go to ED GC/CT pending  Judeth Horn 09/01/2014, 12:11 PM

## 2014-09-02 LAB — HIV ANTIBODY (ROUTINE TESTING W REFLEX): HIV Screen 4th Generation wRfx: NONREACTIVE

## 2014-09-04 LAB — GC/CHLAMYDIA PROBE AMP (~~LOC~~) NOT AT ARMC
Chlamydia: NEGATIVE
Neisseria Gonorrhea: NEGATIVE

## 2014-10-25 ENCOUNTER — Ambulatory Visit: Payer: Self-pay | Admitting: Family Medicine

## 2014-11-27 ENCOUNTER — Encounter: Payer: Self-pay | Admitting: Family Medicine

## 2014-11-27 ENCOUNTER — Ambulatory Visit (INDEPENDENT_AMBULATORY_CARE_PROVIDER_SITE_OTHER): Payer: Self-pay | Admitting: Family Medicine

## 2014-11-27 ENCOUNTER — Other Ambulatory Visit (HOSPITAL_COMMUNITY)
Admission: RE | Admit: 2014-11-27 | Discharge: 2014-11-27 | Disposition: A | Discharge status: Home / Self Care | Payer: Medicaid Other | Source: Ambulatory Visit | Attending: Family Medicine | Admitting: Family Medicine

## 2014-11-27 VITALS — BP 118/77 | HR 77 | Temp 98.3°F | Wt 131.0 lb

## 2014-11-27 DIAGNOSIS — Z113 Encounter for screening for infections with a predominantly sexual mode of transmission: Secondary | ICD-10-CM

## 2014-11-27 DIAGNOSIS — Z01419 Encounter for gynecological examination (general) (routine) without abnormal findings: Secondary | ICD-10-CM

## 2014-11-27 DIAGNOSIS — Z111 Encounter for screening for respiratory tuberculosis: Secondary | ICD-10-CM

## 2014-11-27 DIAGNOSIS — Z30013 Encounter for initial prescription of injectable contraceptive: Secondary | ICD-10-CM

## 2014-11-27 DIAGNOSIS — Z124 Encounter for screening for malignant neoplasm of cervix: Secondary | ICD-10-CM

## 2014-11-27 LAB — POCT WET PREP (WET MOUNT): Clue Cells Wet Prep Whiff POC: POSITIVE

## 2014-11-27 LAB — POCT URINE PREGNANCY: Preg Test, Ur: NEGATIVE

## 2014-11-27 NOTE — Patient Instructions (Signed)
Health Maintenance, Female Adopting a healthy lifestyle and getting preventive care can go a long way to promote health and wellness. Talk with your health care provider about what schedule of regular examinations is right for you. This is a good chance for you to check in with your provider about disease prevention and staying healthy. In between checkups, there are plenty of things you can do on your own. Experts have done a lot of research about which lifestyle changes and preventive measures are most likely to keep you healthy. Ask your health care provider for more information. WEIGHT AND DIET  Eat a healthy diet  Be sure to include plenty of vegetables, fruits, low-fat dairy products, and lean protein.  Do not eat a lot of foods high in solid fats, added sugars, or salt.  Get regular exercise. This is one of the most important things you can do for your health.  Most adults should exercise for at least 150 minutes each week. The exercise should increase your heart rate and make you sweat (moderate-intensity exercise).  Most adults should also do strengthening exercises at least twice a week. This is in addition to the moderate-intensity exercise.  Maintain a healthy weight  Body mass index (BMI) is a measurement that can be used to identify possible weight problems. It estimates body fat based on height and weight. Your health care provider can help determine your BMI and help you achieve or maintain a healthy weight.  For females 20 years of age and older:   A BMI below 18.5 is considered underweight.  A BMI of 18.5 to 24.9 is normal.  A BMI of 25 to 29.9 is considered overweight.  A BMI of 30 and above is considered obese.  Watch levels of cholesterol and blood lipids  You should start having your blood tested for lipids and cholesterol at 29 years of age, then have this test every 5 years.  You may need to have your cholesterol levels checked more often if:  Your lipid  or cholesterol levels are high.  You are older than 29 years of age.  You are at high risk for heart disease.  CANCER SCREENING   Lung Cancer  Lung cancer screening is recommended for adults 55-80 years old who are at high risk for lung cancer because of a history of smoking.  A yearly low-dose CT scan of the lungs is recommended for people who:  Currently smoke.  Have quit within the past 15 years.  Have at least a 30-pack-year history of smoking. A pack year is smoking an average of one pack of cigarettes a day for 1 year.  Yearly screening should continue until it has been 15 years since you quit.  Yearly screening should stop if you develop a health problem that would prevent you from having lung cancer treatment.  Breast Cancer  Practice breast self-awareness. This means understanding how your breasts normally appear and feel.  It also means doing regular breast self-exams. Let your health care provider know about any changes, no matter how small.  If you are in your 20s or 30s, you should have a clinical breast exam (CBE) by a health care provider every 1-3 years as part of a regular health exam.  If you are 40 or older, have a CBE every year. Also consider having a breast X-ray (mammogram) every year.  If you have a family history of breast cancer, talk to your health care provider about genetic screening.  If you   are at high risk for breast cancer, talk to your health care provider about having an MRI and a mammogram every year.  Breast cancer gene (BRCA) assessment is recommended for women who have family members with BRCA-related cancers. BRCA-related cancers include:  Breast.  Ovarian.  Tubal.  Peritoneal cancers.  Results of the assessment will determine the need for genetic counseling and BRCA1 and BRCA2 testing. Cervical Cancer Your health care provider may recommend that you be screened regularly for cancer of the pelvic organs (ovaries, uterus, and  vagina). This screening involves a pelvic examination, including checking for microscopic changes to the surface of your cervix (Pap test). You may be encouraged to have this screening done every 3 years, beginning at age 21.  For women ages 30-65, health care providers may recommend pelvic exams and Pap testing every 3 years, or they may recommend the Pap and pelvic exam, combined with testing for human papilloma virus (HPV), every 5 years. Some types of HPV increase your risk of cervical cancer. Testing for HPV may also be done on women of any age with unclear Pap test results.  Other health care providers may not recommend any screening for nonpregnant women who are considered low risk for pelvic cancer and who do not have symptoms. Ask your health care provider if a screening pelvic exam is right for you.  If you have had past treatment for cervical cancer or a condition that could lead to cancer, you need Pap tests and screening for cancer for at least 20 years after your treatment. If Pap tests have been discontinued, your risk factors (such as having a new sexual partner) need to be reassessed to determine if screening should resume. Some women have medical problems that increase the chance of getting cervical cancer. In these cases, your health care provider may recommend more frequent screening and Pap tests. Colorectal Cancer  This type of cancer can be detected and often prevented.  Routine colorectal cancer screening usually begins at 29 years of age and continues through 29 years of age.  Your health care provider may recommend screening at an earlier age if you have risk factors for colon cancer.  Your health care provider may also recommend using home test kits to check for hidden blood in the stool.  A small camera at the end of a tube can be used to examine your colon directly (sigmoidoscopy or colonoscopy). This is done to check for the earliest forms of colorectal  cancer.  Routine screening usually begins at age 50.  Direct examination of the colon should be repeated every 5-10 years through 29 years of age. However, you may need to be screened more often if early forms of precancerous polyps or small growths are found. Skin Cancer  Check your skin from head to toe regularly.  Tell your health care provider about any new moles or changes in moles, especially if there is a change in a mole's shape or color.  Also tell your health care provider if you have a mole that is larger than the size of a pencil eraser.  Always use sunscreen. Apply sunscreen liberally and repeatedly throughout the day.  Protect yourself by wearing long sleeves, pants, a wide-brimmed hat, and sunglasses whenever you are outside. HEART DISEASE, DIABETES, AND HIGH BLOOD PRESSURE   High blood pressure causes heart disease and increases the risk of stroke. High blood pressure is more likely to develop in:  People who have blood pressure in the high end   of the normal range (130-139/85-89 mm Hg).  People who are overweight or obese.  People who are African American.  If you are 18-39 years of age, have your blood pressure checked every 3-5 years. If you are 40 years of age or older, have your blood pressure checked every year. You should have your blood pressure measured twice--once when you are at a hospital or clinic, and once when you are not at a hospital or clinic. Record the average of the two measurements. To check your blood pressure when you are not at a hospital or clinic, you can use:  An automated blood pressure machine at a pharmacy.  A home blood pressure monitor.  If you are between 55 years and 79 years old, ask your health care provider if you should take aspirin to prevent strokes.  Have regular diabetes screenings. This involves taking a blood sample to check your fasting blood sugar level.  If you are at a normal weight and have a low risk for diabetes,  have this test once every three years after 29 years of age.  If you are overweight and have a high risk for diabetes, consider being tested at a younger age or more often. PREVENTING INFECTION  Hepatitis B  If you have a higher risk for hepatitis B, you should be screened for this virus. You are considered at high risk for hepatitis B if:  You were born in a country where hepatitis B is common. Ask your health care provider which countries are considered high risk.  Your parents were born in a high-risk country, and you have not been immunized against hepatitis B (hepatitis B vaccine).  You have HIV or AIDS.  You use needles to inject street drugs.  You live with someone who has hepatitis B.  You have had sex with someone who has hepatitis B.  You get hemodialysis treatment.  You take certain medicines for conditions, including cancer, organ transplantation, and autoimmune conditions. Hepatitis C  Blood testing is recommended for:  Everyone born from 1945 through 1965.  Anyone with known risk factors for hepatitis C. Sexually transmitted infections (STIs)  You should be screened for sexually transmitted infections (STIs) including gonorrhea and chlamydia if:  You are sexually active and are younger than 29 years of age.  You are older than 29 years of age and your health care provider tells you that you are at risk for this type of infection.  Your sexual activity has changed since you were last screened and you are at an increased risk for chlamydia or gonorrhea. Ask your health care provider if you are at risk.  If you do not have HIV, but are at risk, it may be recommended that you take a prescription medicine daily to prevent HIV infection. This is called pre-exposure prophylaxis (PrEP). You are considered at risk if:  You are sexually active and do not regularly use condoms or know the HIV status of your partner(s).  You take drugs by injection.  You are sexually  active with a partner who has HIV. Talk with your health care provider about whether you are at high risk of being infected with HIV. If you choose to begin PrEP, you should first be tested for HIV. You should then be tested every 3 months for as long as you are taking PrEP.  PREGNANCY   If you are premenopausal and you may become pregnant, ask your health care provider about preconception counseling.  If you may   become pregnant, take 400 to 800 micrograms (mcg) of folic acid every day.  If you want to prevent pregnancy, talk to your health care provider about birth control (contraception). OSTEOPOROSIS AND MENOPAUSE   Osteoporosis is a disease in which the bones lose minerals and strength with aging. This can result in serious bone fractures. Your risk for osteoporosis can be identified using a bone density scan.  If you are 65 years of age or older, or if you are at risk for osteoporosis and fractures, ask your health care provider if you should be screened.  Ask your health care provider whether you should take a calcium or vitamin D supplement to lower your risk for osteoporosis.  Menopause may have certain physical symptoms and risks.  Hormone replacement therapy may reduce some of these symptoms and risks. Talk to your health care provider about whether hormone replacement therapy is right for you.  HOME CARE INSTRUCTIONS   Schedule regular health, dental, and eye exams.  Stay current with your immunizations.   Do not use any tobacco products including cigarettes, chewing tobacco, or electronic cigarettes.  If you are pregnant, do not drink alcohol.  If you are breastfeeding, limit how much and how often you drink alcohol.  Limit alcohol intake to no more than 1 drink per day for nonpregnant women. One drink equals 12 ounces of beer, 5 ounces of wine, or 1 ounces of hard liquor.  Do not use street drugs.  Do not share needles.  Ask your health care provider for help if  you need support or information about quitting drugs.  Tell your health care provider if you often feel depressed.  Tell your health care provider if you have ever been abused or do not feel safe at home.   This information is not intended to replace advice given to you by your health care provider. Make sure you discuss any questions you have with your health care provider.   Document Released: 07/15/2010 Document Revised: 01/20/2014 Document Reviewed: 12/01/2012 Elsevier Interactive Patient Education 2016 [employer]  

## 2014-11-27 NOTE — Assessment & Plan Note (Signed)
Wants to start depo. Urine pregnancy test negative today but patient with active unprotected intercourse, as recently as this morning. Discussed options with patient, including starting depo today with understanding that pregnancy test may not be accurate. -offered emergency contraception, patient declined -no unprotected intercourse for 2 weeks, then return for depo

## 2014-11-27 NOTE — Progress Notes (Signed)
Patient ID: Rachel Lloyd, female   DOB: 11/27/1985, 29 y.o.   MRN: [id number] Date of Visit: 11/27/2014   HPI:  Patient presents today for a well woman gynecological exam and STD screen.   Concerns today: none Periods: LMP early October. Gets monthly periods.  Contraception: none presently. Wants to start depo. Last unprotected sex this morning.  Pelvic symptoms: discharge that is white, with itching and odor. No pelvic pain Sexual activity: currently sexually active STD Screening: would like full STD screening today Pap smear status: due today Exercise: none Smoking: smokes 4-5 cigarettes per day Alcohol: social drinking Drugs: marijuana use 2-3 times per week, no other drugs  ROS: See HPI  PMFSH:  Cancers in family: mom with history of throat cancer (pt did not know type)  PHYSICAL EXAM: BP 118/77 mmHg  Pulse 77  Temp(Src) 98.3 F (36.8 C) (Oral)  Wt 131 lb (59.421 kg)  LMP 10/28/2014 (Approximate) Gen: NAD, pleasant, cooperative HEENT: normocephalic, atraumatic, well appearing Neuro: grossly nonfocal, speech normal GU: normal appearing external genitalia without lesions. Vagina is moist with white discharge. Cervix normal in appearance. No cervical motion tenderness or tenderness on bimanual exam. No adnexal masses.   ASSESSMENT/PLAN:  Health maintenance:  -STD screening: will check gc/chlamydia/trichomonas, HIV, RPR, acute hepatitis panel today  -pap smear: done today -immunizations: declined flu shot today -handout given on health maintenance topics  Contraception management Wants to start depo. Urine pregnancy test negative today but patient with active unprotected intercourse, as recently as this morning. Discussed options with patient, including starting depo today with understanding that pregnancy test may not be accurate. -offered emergency contraception, patient declined -no unprotected intercourse for 2 weeks, then return for depo    FOLLOW UP: F/u in 2  weeks for depo shot  GrenadaBrittany J. Pollie MeyerMcIntyre, MD [facility]

## 2014-11-28 ENCOUNTER — Telehealth: Payer: Self-pay | Admitting: Family Medicine

## 2014-11-28 LAB — RPR

## 2014-11-28 LAB — CYTOLOGY - PAP

## 2014-11-28 LAB — HEPATITIS PANEL, ACUTE
HCV Ab: NEGATIVE
Hep A IgM: NONREACTIVE
Hep B C IgM: NONREACTIVE
Hepatitis B Surface Ag: NEGATIVE

## 2014-11-28 LAB — HIV ANTIBODY (ROUTINE TESTING W REFLEX): HIV 1&2 Ab, 4th Generation: NONREACTIVE

## 2014-11-28 MED ORDER — METRONIDAZOLE 500 MG PO TABS: 500.0000 mg | ORAL_TABLET | Freq: Two times a day (BID) | ORAL | Status: DC

## 2014-11-28 NOTE — Telephone Encounter (Signed)
Called patient to inform of negative pap & STD results but positive BV. rx for flagyl sent in for patient. She was appreciative.  Latrelle DodrillBrittany J Stephine Langbehn, MD

## 2014-11-29 ENCOUNTER — Encounter: Payer: Self-pay | Admitting: *Deleted

## 2014-11-29 ENCOUNTER — Ambulatory Visit (INDEPENDENT_AMBULATORY_CARE_PROVIDER_SITE_OTHER): Payer: Self-pay | Admitting: *Deleted

## 2014-11-29 DIAGNOSIS — Z111 Encounter for screening for respiratory tuberculosis: Secondary | ICD-10-CM

## 2014-11-29 LAB — TB SKIN TEST
Induration: 0 mm
TB Skin Test: NEGATIVE

## 2014-12-28 ENCOUNTER — Inpatient Hospital Stay (HOSPITAL_COMMUNITY)
Admission: AD | Admit: 2014-12-28 | Discharge: 2014-12-28 | Disposition: A | Discharge status: Home / Self Care | Payer: Self-pay | Source: Ambulatory Visit | Attending: Family Medicine | Admitting: Family Medicine

## 2014-12-28 ENCOUNTER — Encounter (HOSPITAL_COMMUNITY): Payer: Self-pay | Admitting: *Deleted

## 2014-12-28 DIAGNOSIS — O219 Vomiting of pregnancy, unspecified: Secondary | ICD-10-CM

## 2014-12-28 DIAGNOSIS — Z3A08 8 weeks gestation of pregnancy: Secondary | ICD-10-CM | POA: Insufficient documentation

## 2014-12-28 DIAGNOSIS — Z3201 Encounter for pregnancy test, result positive: Secondary | ICD-10-CM

## 2014-12-28 DIAGNOSIS — O210 Mild hyperemesis gravidarum: Secondary | ICD-10-CM | POA: Insufficient documentation

## 2014-12-28 DIAGNOSIS — R1012 Left upper quadrant pain: Secondary | ICD-10-CM | POA: Insufficient documentation

## 2014-12-28 LAB — URINALYSIS, ROUTINE W REFLEX MICROSCOPIC
Bilirubin Urine: NEGATIVE
Glucose, UA: NEGATIVE mg/dL
Ketones, ur: 15 mg/dL — AB
Leukocytes, UA: NEGATIVE
Nitrite: NEGATIVE
Protein, ur: NEGATIVE mg/dL
Specific Gravity, Urine: 1.02 (ref 1.005–1.030)
pH: 6.5 (ref 5.0–8.0)

## 2014-12-28 LAB — CBC WITH DIFFERENTIAL/PLATELET
Basophils Absolute: 0 10*3/uL (ref 0.0–0.1)
Basophils Relative: 0 %
Eosinophils Absolute: 0.1 10*3/uL (ref 0.0–0.7)
Eosinophils Relative: 1 %
HCT: 34.9 % — ABNORMAL LOW (ref 36.0–46.0)
Hemoglobin: 11.9 g/dL — ABNORMAL LOW (ref 12.0–15.0)
Lymphocytes Relative: 25 %
Lymphs Abs: 2.4 10*3/uL (ref 0.7–4.0)
MCH: 30.4 pg (ref 26.0–34.0)
MCHC: 34.1 g/dL (ref 30.0–36.0)
MCV: 89.3 fL (ref 78.0–100.0)
Monocytes Absolute: 0.7 10*3/uL (ref 0.1–1.0)
Monocytes Relative: 8 %
Neutro Abs: 6.4 10*3/uL (ref 1.7–7.7)
Neutrophils Relative %: 66 %
Platelets: 258 10*3/uL (ref 150–400)
RBC: 3.91 MIL/uL (ref 3.87–5.11)
RDW: 12.7 % (ref 11.5–15.5)
WBC: 9.7 10*3/uL (ref 4.0–10.5)

## 2014-12-28 LAB — COMPREHENSIVE METABOLIC PANEL
ALT: 26 U/L (ref 14–54)
AST: 35 U/L (ref 15–41)
Albumin: 4 g/dL (ref 3.5–5.0)
Alkaline Phosphatase: 49 U/L (ref 38–126)
Anion gap: 7 (ref 5–15)
BUN: 14 mg/dL (ref 6–20)
CO2: 23 mmol/L (ref 22–32)
Calcium: 9.2 mg/dL (ref 8.9–10.3)
Chloride: 104 mmol/L (ref 101–111)
Creatinine, Ser: 0.87 mg/dL (ref 0.44–1.00)
GFR calc Af Amer: >60 mL/min (ref >60)
GFR calc non Af Amer: >60 mL/min (ref >60)
Glucose, Bld: 90 mg/dL (ref 65–99)
Potassium: 4.7 mmol/L (ref 3.5–5.1)
Sodium: 134 mmol/L — ABNORMAL LOW (ref 135–145)
Total Bilirubin: 0.5 mg/dL (ref 0.3–1.2)
Total Protein: 6.8 g/dL (ref 6.5–8.1)

## 2014-12-28 LAB — POCT PREGNANCY, URINE: Preg Test, Ur: POSITIVE — AB

## 2014-12-28 LAB — URINE MICROSCOPIC-ADD ON

## 2014-12-28 LAB — AMYLASE: Amylase: 89 U/L (ref 28–100)

## 2014-12-28 LAB — LIPASE, BLOOD: Lipase: 23 U/L (ref 11–51)

## 2014-12-28 MED ORDER — PROMETHAZINE HCL 25 MG/ML IJ SOLN
25.0000 mg | Freq: Once | INTRAVENOUS | Status: AC
  Administered 2014-12-28: 25 mg via INTRAVENOUS
  Filled 2014-12-28: qty 1

## 2014-12-28 MED ORDER — LACTATED RINGERS IV BOLUS (SEPSIS)
1000.0000 mL | Freq: Once | INTRAVENOUS | Status: AC
  Administered 2014-12-28: 1000 mL via INTRAVENOUS

## 2014-12-28 MED ORDER — PROMETHAZINE HCL 12.5 MG PO TABS: 12.5000 mg | ORAL_TABLET | Freq: Four times a day (QID) | ORAL | Status: DC | PRN

## 2014-12-28 MED ORDER — METOCLOPRAMIDE HCL 5 MG/ML IJ SOLN
10.0000 mg | Freq: Once | INTRAMUSCULAR | Status: AC
  Administered 2014-12-28: 10 mg via INTRAVENOUS
  Filled 2014-12-28: qty 2

## 2014-12-28 MED ORDER — METOCLOPRAMIDE HCL 10 MG PO TABS: 10.0000 mg | ORAL_TABLET | Freq: Four times a day (QID) | ORAL | Status: DC

## 2014-12-28 NOTE — MAU Provider Note (Signed)
History     CSN: [id number]  Arrival date and time: 12/28/14 1731   First Provider Initiated Contact with Patient 12/28/14 1807      Chief Complaint  Patient presents with  . Possible Pregnancy  . Emesis   HPI Rachel Lloyd is a 29 y.o. [phone] at [redacted]w[redacted]d who presents to MAU today with complaint of nausea and vomiting x 2 days. The patient states recent +HPT. She denies fever, diarrhea or vaginal bleeding. She endorses LUQ abdominal pain that sometimes radiates to her upper back. The pain is intermittent and rated at 5/10 at the worst. She states 4-5 episodes of emesis today and unable to keep anything down.   OB History    Gravida Para Term Preterm AB TAB SAB Ectopic Multiple Living   0 3 3 0 0 0 3      Past Medical History  Diagnosis Date  . Mental disorder 2010    bipolar  . Scabies exposure 2012    treated july 2012 & reexposed  . Hypertension     During second pregnancy  . Pneumonia   . Chlamydia     Past Surgical History  Procedure Laterality Date  . Mouth surgery      as a child  . Wisdom tooth extraction    . Induced abortion      Family History  Problem Relation Age of Onset  . Anesthesia problems Neg Hx   . Hypertension Mother   . Cancer Mother   . Fibroids Mother   . ADD / ADHD Brother   . Hypertension Maternal Grandmother     Social History  Substance Use Topics  . Smoking status: Former Smoker    Types: Cigarettes    Quit date: 04/04/2010  . Smokeless tobacco: Never Used  . Alcohol Use: No     Comment: quit when found out was pregnant    Allergies:  Allergies  Allergen Reactions  . Penicillins Anaphylaxis    Has patient had a PCN reaction causing immediate rash, facial/tongue/throat swelling, SOB or lightheadedness with hypotension: Yes Has patient had a PCN reaction causing severe rash involving mucus membranes or skin necrosis: Yes Has patient had a PCN reaction that required hospitalization Yes Has patient had a PCN  reaction occurring within the last 10 years: No If all of the above answers are "NO", then may proceed with Cephalosporin use.  . Latex Itching and Rash    Prescriptions prior to admission  Medication Sig Dispense Refill Last Dose  . metroNIDAZOLE (FLAGYL) 500 MG tablet Take 1 tablet (500 mg total) by mouth 2 (two) times daily. For 7 days. Do not drink alcohol while taking this medication. (Patient not taking: Reported on 12/28/2014) 14 tablet 0 Completed Course at Unknown time    Review of Systems  Constitutional: Negative for fever and malaise/fatigue.  Gastrointestinal: Positive for nausea, vomiting and abdominal pain. Negative for diarrhea and constipation.  Genitourinary:       Neg - vaginal bleeding   Physical Exam   Blood pressure 117/81, pulse 91, temperature 98.4 F (36.9 C), temperature source Oral, resp. rate 20, height  (1.499 m), weight 135 lb 12.8 oz (61.598 kg), last menstrual period 10/28/2014, unknown if currently breastfeeding.  Physical Exam  Nursing note and vitals reviewed. Constitutional: She is oriented to person, place, and time. She appears well-developed and well-nourished. No distress.  HENT:  Head: Normocephalic and atraumatic.  Cardiovascular: Normal rate.   Respiratory: Effort normal.  GI: Soft. She exhibits no distension and no mass. There is no tenderness. There is no rebound and no guarding.  Neurological: She is alert and oriented to person, place, and time.  Skin: Skin is warm and dry. No erythema.  Psychiatric: She has a normal mood and affect.    Results for orders placed or performed during the hospital encounter of 12/28/14 (from the past 24 hour(s))  Urinalysis, Routine w reflex microscopic (not at [facility])     Status: Abnormal   Collection Time: 12/28/14  5:45 PM  Result Value Ref Range   Color, Urine YELLOW YELLOW   APPearance HAZY (A) CLEAR   Specific Gravity, Urine 1.020 1.005 - 1.030   pH 6.5 5.0 - 8.0   Glucose, UA NEGATIVE  NEGATIVE mg/dL   Hgb urine dipstick TRACE (A) NEGATIVE   Bilirubin Urine NEGATIVE NEGATIVE   Ketones, ur 15 (A) NEGATIVE mg/dL   Protein, ur NEGATIVE NEGATIVE mg/dL   Nitrite NEGATIVE NEGATIVE   Leukocytes, UA NEGATIVE NEGATIVE  Urine microscopic-add on     Status: Abnormal   Collection Time: 12/28/14  5:45 PM  Result Value Ref Range   Squamous Epithelial / LPF 6-30 (A) NONE SEEN   WBC, UA 0-5 0 - 5 WBC/hpf   RBC / HPF 0-5 0 - 5 RBC/hpf   Bacteria, UA RARE (A) NONE SEEN  Pregnancy, urine POC     Status: Abnormal   Collection Time: 12/28/14  5:53 PM  Result Value Ref Range   Preg Test, Ur POSITIVE (A) NEGATIVE  CBC with Differential/Platelet     Status: Abnormal   Collection Time: 12/28/14  6:35 PM  Result Value Ref Range   WBC 9.7 4.0 - 10.5 K/uL   RBC 3.91 3.87 - 5.11 MIL/uL   Hemoglobin 11.9 (L) 12.0 - 15.0 g/dL   HCT 16.1 (L) 09.6 - 04.5 %   MCV 89.3 78.0 - 100.0 fL   MCH 30.4 26.0 - 34.0 pg   MCHC 34.1 30.0 - 36.0 g/dL   RDW 40.9 81.1 - 91.4 %   Platelets 258 150 - 400 K/uL   Neutrophils Relative % 66 %   Neutro Abs 6.4 1.7 - 7.7 K/uL   Lymphocytes Relative 25 %   Lymphs Abs 2.4 0.7 - 4.0 K/uL   Monocytes Relative 8 %   Monocytes Absolute 0.7 0.1 - 1.0 K/uL   Eosinophils Relative 1 %   Eosinophils Absolute 0.1 0.0 - 0.7 K/uL   Basophils Relative 0 %   Basophils Absolute 0.0 0.0 - 0.1 K/uL  Comprehensive metabolic panel     Status: Abnormal   Collection Time: 12/28/14  6:35 PM  Result Value Ref Range   Sodium 134 (L) 135 - 145 mmol/L   Potassium 4.7 3.5 - 5.1 mmol/L   Chloride 104 101 - 111 mmol/L   CO2 23 22 - 32 mmol/L   Glucose, Bld 90 65 - 99 mg/dL   BUN 14 6 - 20 mg/dL   Creatinine, Ser 7.82 0.44 - 1.00 mg/dL   Calcium 9.2 8.9 - 95.6 mg/dL   Total Protein 6.8 6.5 - 8.1 g/dL   Albumin 4.0 3.5 - 5.0 g/dL   AST 35 15 - 41 U/L   ALT 26 14 - 54 U/L   Alkaline Phosphatase 49 38 - 126 U/L   Total Bilirubin 0.5 0.3 - 1.2 mg/dL   GFR calc non Af Amer >60 >60  mL/min   GFR calc Af Amer >60 >60 mL/min  Anion gap 7 5 - 15  Amylase     Status: None   Collection Time: 12/28/14  6:35 PM  Result Value Ref Range   Amylase 89 28 - 100 U/L  Lipase, blood     Status: None   Collection Time: 12/28/14  6:35 PM  Result Value Ref Range   Lipase 23 11 - 51 U/L    MAU Course  Procedures None  MDM +UPT UA today shows evidence of dehydration CBC, CMP, Amylase and Lipase today IV LR with 25 mg Phenergan given for N/V Patient states continued nausea and is "coughing" a lot feeling like she has to throw up 10 mg Reglan given with LR Patient is resting comfortably and has not had any actual emesis while in MAU Assessment and Plan  A: Positive pregnancy test Nausea and vomiting in pregnancy prior to [redacted] weeks gestation  P: Discharge home Rx for Reglan and Phenergan given to patient Diet for N/V and first trimester precautions discussed Patient advised to follow-up with OB provider of choice to start prenatal care ASAP Patient may return to MAU as needed or if her condition were to change or worsen   Marny LowensteinJulie N Shamarie Call, PA-C  12/28/2014, 8:53 PM

## 2014-12-28 NOTE — MAU Note (Signed)
Last 3 or days, been real sick on her stomach. +HPT 2 days ago.  Can't keep nothing down.

## 2014-12-28 NOTE — Discharge Instructions (Signed)
Morning Sickness °Morning sickness is when you feel sick to your stomach (nauseous) during pregnancy. You may feel sick to your stomach and throw up (vomit). You may feel sick in the morning, but you can feel this way any time of day. Some women feel very sick to their stomach and cannot stop throwing up (hyperemesis gravidarum). °HOME CARE °· Only take medicines as told by your doctor. °· Take multivitamins as told by your doctor. Taking multivitamins before getting pregnant can stop or lessen the harshness of morning sickness. °· Eat dry toast or unsalted crackers before getting out of bed. °· Eat 5 to 6 small meals a day. °· Eat dry and bland foods like rice and baked potatoes. °· Do not drink liquids with meals. Drink between meals. °· Do not eat greasy, fatty, or spicy foods. °· Have someone cook for you if the smell of food causes you to feel sick or throw up. °· If you feel sick to your stomach after taking prenatal vitamins, take them at night or with a snack. °· Eat protein when you need a snack (nuts, yogurt, cheese). °· Eat unsweetened gelatins for dessert. °· Wear a bracelet used for sea sickness (acupressure wristband). °· Go to a doctor that puts thin needles into certain body points (acupuncture) to improve how you feel. °· Do not smoke. °· Use a humidifier to keep the air in your house free of odors. °· Get lots of fresh air. °GET HELP IF: °· You need medicine to feel better. °· You feel dizzy or lightheaded. °· You are losing weight. °GET HELP RIGHT AWAY IF:  °· You feel very sick to your stomach and cannot stop throwing up. °· You pass out (faint). °MAKE SURE YOU: °· Understand these instructions. °· Will watch your condition. °· Will get help right away if you are not doing well or get worse. °  °This information is not intended to replace advice given to you by your health care provider. Make sure you discuss any questions you have with your health care provider. °  °Document Released: 02/07/2004  Document Revised: 01/20/2014 Document Reviewed: 06/16/2012 °Elsevier Interactive Patient Education ©2016 Elsevier Inc. °Eating Plan for Hyperemesis Gravidarum °Severe cases of hyperemesis gravidarum can lead to dehydration and malnutrition. The hyperemesis eating plan is one way to lessen the symptoms of nausea and vomiting. It is often used with prescribed medicines to control your symptoms.  °WHAT CAN I DO TO RELIEVE MY SYMPTOMS? °Listen to your body. Everyone is different and has different preferences. Find what works best for you. Some of the following things may help: °· Eat and drink slowly. °· Eat 5-6 small meals daily instead of 3 large meals.   °· Eat crackers before you get out of bed in the morning.   °· Starchy foods are usually well tolerated (such as cereal, toast, bread, potatoes, pasta, rice, and pretzels).   °· Ginger may help with nausea. Add ¼ tsp ground ginger to hot tea or choose ginger tea.   °· Try drinking 100% fruit juice or an electrolyte drink. °· Continue to take your prenatal vitamins as directed by your health care provider. If you are having trouble taking your prenatal vitamins, talk with your health care provider about different options. °· Include at least 1 serving of protein with your meals and snacks (such as meats or poultry, beans, nuts, eggs, or yogurt). Try eating a protein-rich snack before bed (such as cheese and crackers or a half turkey or peanut butter sandwich). °  WHAT THINGS SHOULD I AVOID TO REDUCE MY SYMPTOMS? The following things may help reduce your symptoms:  Avoid foods with strong smells. Try eating meals in well-ventilated areas that are free of odors.  Avoid drinking water or other beverages with meals. Try not to drink anything less than 30 minutes before and after meals.  Avoid drinking more than 1 cup of fluid at a time.  Avoid fried or high-fat foods, such as butter and cream sauces.  Avoid spicy foods.  Avoid skipping meals the best you can.  Nausea can be more intense on an empty stomach. If you cannot tolerate food at that time, do not force it. Try sucking on ice chips or other frozen items and make up the calories later.  Avoid lying down within 2 hours after eating.   This information is not intended to replace advice given to you by your health care provider. Make sure you discuss any questions you have with your health care provider.   Document Released: 10/27/2006 Document Revised: 01/04/2013 Document Reviewed: 11/03/2012 Elsevier Interactive Patient Education 2016 [employer]  Prenatal Care Providers [facility]    [facility]  [phone]     Phone: [phone]          [facility]                      [facility]  @[place]     Phone: [phone]  Phone: [phone]         [facility]     Phone: [phone]  Phone: [phone]           [facility] [place]                hone: [phone]  Phone: [phone]         [facility] [address]      Phone: [phone]  Phone: [phone]         [facility]                Phone: [phone]  [facility]   [address][phone]    [facility]          Phone: [phone] [facility]   Phone: [phone]

## 2015-01-10 ENCOUNTER — Encounter: Payer: Self-pay | Admitting: Family Medicine

## 2015-01-10 ENCOUNTER — Other Ambulatory Visit (HOSPITAL_COMMUNITY)
Admission: RE | Admit: 2015-01-10 | Discharge: 2015-01-10 | Disposition: A | Discharge status: Home / Self Care | Payer: Medicaid Other | Source: Ambulatory Visit | Attending: Family Medicine | Admitting: Family Medicine

## 2015-01-10 ENCOUNTER — Other Ambulatory Visit: Payer: Self-pay | Admitting: Family Medicine

## 2015-01-10 ENCOUNTER — Ambulatory Visit (INDEPENDENT_AMBULATORY_CARE_PROVIDER_SITE_OTHER): Payer: Self-pay | Admitting: Family Medicine

## 2015-01-10 VITALS — BP 105/71 | HR 104 | Temp 98.6°F | Wt 129.0 lb

## 2015-01-10 DIAGNOSIS — N898 Other specified noninflammatory disorders of vagina: Secondary | ICD-10-CM

## 2015-01-10 DIAGNOSIS — R1010 Upper abdominal pain, unspecified: Secondary | ICD-10-CM | POA: Insufficient documentation

## 2015-01-10 DIAGNOSIS — R1032 Left lower quadrant pain: Secondary | ICD-10-CM

## 2015-01-10 DIAGNOSIS — O219 Vomiting of pregnancy, unspecified: Secondary | ICD-10-CM

## 2015-01-10 DIAGNOSIS — R109 Unspecified abdominal pain: Secondary | ICD-10-CM | POA: Insufficient documentation

## 2015-01-10 DIAGNOSIS — O0000 Abdominal pregnancy without intrauterine pregnancy: Secondary | ICD-10-CM

## 2015-01-10 DIAGNOSIS — Z113 Encounter for screening for infections with a predominantly sexual mode of transmission: Secondary | ICD-10-CM | POA: Insufficient documentation

## 2015-01-10 LAB — POCT WET PREP (WET MOUNT): Clue Cells Wet Prep Whiff POC: POSITIVE

## 2015-01-10 LAB — POCT URINALYSIS DIPSTICK
Bilirubin, UA: NEGATIVE
Glucose, UA: NEGATIVE
Ketones, UA: NEGATIVE
Leukocytes, UA: NEGATIVE
Nitrite, UA: NEGATIVE
Protein, UA: NEGATIVE
Spec Grav, UA: >=1.03
Urobilinogen, UA: 1
pH, UA: 6

## 2015-01-10 LAB — POCT UA - MICROSCOPIC ONLY

## 2015-01-10 MED ORDER — METRONIDAZOLE 500 MG PO TABS: 500.0000 mg | ORAL_TABLET | Freq: Two times a day (BID) | ORAL | Status: DC

## 2015-01-10 MED ORDER — VITAMIN B-6 25 MG PO TABS: 25.0000 mg | ORAL_TABLET | Freq: Three times a day (TID) | ORAL | Status: DC

## 2015-01-10 NOTE — Patient Instructions (Signed)
We will give you medication for your nausea.  We will check swabs and your urine today. You should also have an ultrasound to make sure you don't have an ectopic pregnancy.  We will call you or send a letter with the results.  If your abdominal pain worsens or is not improving within the next few days, please let us know.  Take care,  Dr Jimmey RalphParker

## 2015-01-10 NOTE — Assessment & Plan Note (Addendum)
Wet prep consistent with BV. Will treat with flagyl. GC/CT pending, though doubt given low WBCs on wet prep.

## 2015-01-10 NOTE — Assessment & Plan Note (Signed)
Will start pyridoxine today.

## 2015-01-10 NOTE — Progress Notes (Signed)
Subjective:  Rachel Lloyd is a 29 y.o. female who [facility] with a chief complaint of abdominal pain.   HPI:  Abdominal Pain Patient presents with LLQ abdominal pain that started about 2-3 weeks ago. Pain is intermittent and described as achy. Patient has also had nausea and vomiting. She was seen in the MAU 2 weeks ago and found to have a positive pregnancy test. No constipation or diarrhea. Last BM this morning. Has not tried any medications. No fevers or chills. No hematemesis or hematochezia. Has had decreased appetite.   Vaginal Discharge Patient also presents with vaginal discharge over the past 2-3 weeks. Discharge is described as white and foul smelling. No medications tried.   ROS: Per HPI  Objective:  Physical Exam: BP 105/71 mmHg  Pulse 104  Temp(Src) 98.6 F (37 C) (Oral)  Wt 129 lb (58.514 kg)  LMP 10/28/2014 (Approximate)  Gen: NAD, resting comfortably CV: RRR with no murmurs appreciated Lungs: NWOB, CTAB with no crackles, wheezes, or rhonchi GI: Normal bowel sounds present. Mildly tender to palpation in LLQ. No rebound or guarding. No masses. GU: Normal external female genitalia. White discharge noted at vaginal opening. Speculum and bimanual exam deferred by patient. MSK: no edema, cyanosis, or clubbing noted Skin: warm, dry Neuro: grossly normal, moves all extremities Psych: Normal affect and thought content  Results for orders placed or performed in visit on 01/10/15 (from the past 72 hour(s))  POCT urinalysis dipstick     Status: Abnormal   Collection Time: 01/10/15  4:13 PM  Result Value Ref Range   Color, UA YELLOW    Clarity, UA CLEAR    Glucose, UA NEG    Bilirubin, UA NEG    Ketones, UA NEG    Spec Grav, UA >=1.030    Blood, UA SMALL    pH, UA 6.0    Protein, UA NEG    Urobilinogen, UA 1.0    Nitrite, UA NEG    Leukocytes, UA Negative Negative  POCT Wet Prep Mellody Drown(Wet [place])     Status: Abnormal   Collection Time: 01/10/15  4:13 PM  Result Value Ref Range   Source Wet Prep POC VAG    WBC, Wet Prep HPF POC 1-3    Bacteria Wet Prep HPF POC Many (A) None, Few, Too numerous to count   Clue Cells Wet Prep HPF POC Moderate (A) None, Too numerous to count   Clue Cells Wet Prep Whiff POC Positive Whiff    Yeast Wet Prep HPF POC None    Trichomonas Wet Prep HPF POC NONE   POCT UA - Microscopic Only     Status: None   Collection Time: 01/10/15  4:13 PM  Result Value Ref Range   WBC, Ur, HPF, POC NONE    RBC, urine, microscopic OCCASIONAL    Bacteria, U Microscopic 1+    Mucus, UA 1+    Epithelial cells, urine per micros 10-15     Comment: CLUE CELLS PRESENT     Assessment/Plan:  Abdominal pain Concern for ectopic pregnancy. No peritoneal signs or signs of rupture today. Will obtain ultrasound to rule out ectopic pregnancy. UA without frank signs of infection. Pain may also be related to vaginal discharge. Wet prep and GC/CT pending.   Vaginal discharge Wet prep consistent with BV. Will treat with flagyl. GC/CT pending, though doubt given low WBCs on wet prep.   Nausea and vomiting during pregnancy Will start  pyridoxine today.   Katina Degree. Jimmey Ralph, MD [facility] Resident PGY-2 01/10/2015 5:14 PM

## 2015-01-10 NOTE — Assessment & Plan Note (Addendum)
Concern for ectopic pregnancy. No peritoneal signs or signs of rupture today. Will obtain ultrasound to rule out ectopic pregnancy. UA without frank signs of infection. Pain may also be related to vaginal discharge. Wet prep and GC/CT pending.

## 2015-01-11 ENCOUNTER — Other Ambulatory Visit: Payer: Self-pay | Admitting: Family Medicine

## 2015-01-11 ENCOUNTER — Telehealth: Payer: Self-pay | Admitting: Family Medicine

## 2015-01-11 ENCOUNTER — Ambulatory Visit (HOSPITAL_COMMUNITY)
Admission: RE | Admit: 2015-01-11 | Discharge: 2015-01-11 | Disposition: A | Discharge status: Home / Self Care | Payer: Medicaid Other | Source: Ambulatory Visit | Attending: Family Medicine | Admitting: Family Medicine

## 2015-01-11 ENCOUNTER — Ambulatory Visit (HOSPITAL_COMMUNITY): Admission: RE | Admit: 2015-01-11 | Payer: Medicaid Other | Source: Ambulatory Visit

## 2015-01-11 ENCOUNTER — Encounter: Payer: Self-pay | Admitting: Family Medicine

## 2015-01-11 DIAGNOSIS — O26891 Other specified pregnancy related conditions, first trimester: Secondary | ICD-10-CM | POA: Insufficient documentation

## 2015-01-11 DIAGNOSIS — R1032 Left lower quadrant pain: Secondary | ICD-10-CM | POA: Insufficient documentation

## 2015-01-11 DIAGNOSIS — O0000 Abdominal pregnancy without intrauterine pregnancy: Secondary | ICD-10-CM

## 2015-01-11 DIAGNOSIS — O26899 Other specified pregnancy related conditions, unspecified trimester: Secondary | ICD-10-CM

## 2015-01-11 DIAGNOSIS — R109 Unspecified abdominal pain: Secondary | ICD-10-CM

## 2015-01-11 DIAGNOSIS — N8311 Corpus luteum cyst of right ovary: Secondary | ICD-10-CM | POA: Insufficient documentation

## 2015-01-11 DIAGNOSIS — Z3A08 8 weeks gestation of pregnancy: Secondary | ICD-10-CM | POA: Insufficient documentation

## 2015-01-11 DIAGNOSIS — O3481 Maternal care for other abnormalities of pelvic organs, first trimester: Secondary | ICD-10-CM | POA: Insufficient documentation

## 2015-01-11 LAB — URINE CULTURE
Colony Count: NO GROWTH
Organism ID, Bacteria: NO GROWTH

## 2015-01-11 NOTE — Telephone Encounter (Signed)
Pt is calling to get her lab results from yesterday. She was talking to the doctor then but her phone died. jw

## 2015-01-11 NOTE — Telephone Encounter (Signed)
Spoke with patient and informed her of BV results and that her medication was sent to the pharmacy.  Also that we would call her with results from her ultrasound once this was completed today. Khai Arrona,CMA

## 2015-01-13 LAB — GC/CHLAMYDIA PROBE AMP (~~LOC~~) NOT AT ARMC
Chlamydia: NEGATIVE
Neisseria Gonorrhea: NEGATIVE

## 2015-01-15 ENCOUNTER — Encounter: Payer: Self-pay | Admitting: Family Medicine

## 2015-01-24 ENCOUNTER — Encounter: Payer: Self-pay | Admitting: Family Medicine

## 2015-01-24 ENCOUNTER — Ambulatory Visit (INDEPENDENT_AMBULATORY_CARE_PROVIDER_SITE_OTHER): Payer: Self-pay | Admitting: Family Medicine

## 2015-01-24 VITALS — BP 110/76 | HR 97 | Temp 98.1°F | Wt 127.2 lb

## 2015-01-24 DIAGNOSIS — Z3491 Encounter for supervision of normal pregnancy, unspecified, first trimester: Secondary | ICD-10-CM

## 2015-01-24 MED ORDER — PRENATAL VITAMIN 27-0.8 MG PO TABS: 1.0000 | ORAL_TABLET | Freq: Every day | ORAL | Status: DC

## 2015-01-24 NOTE — Patient Instructions (Addendum)
For you sinus congestion you can use benadryl (can make you sleepy) or Zyrtec (daytime) to help with the headache you're having in your forehead  First Trimester of Pregnancy The first trimester of pregnancy is from week 1 until the end of week 12 (months 1 through 3). A week after a sperm fertilizes an egg, the egg will implant on the wall of the uterus. This embryo will begin to develop into a baby. Genes from you and your partner are forming the baby. The female genes determine whether the baby is a boy or a girl. At 6-8 weeks, the eyes and face are formed, and the heartbeat can be seen on ultrasound. At the end of 12 weeks, all the baby's organs are formed.  Now that you are pregnant, you will want to do everything you can to have a healthy baby. Two of the most important things are to get good prenatal care and to follow your health care provider's instructions. Prenatal care is all the medical care you receive before the baby's birth. This care will help prevent, find, and treat any problems during the pregnancy and childbirth. BODY CHANGES Your body goes through many changes during pregnancy. The changes vary from woman to woman.   You may gain or lose a couple of pounds at first.  You may feel sick to your stomach (nauseous) and throw up (vomit). If the vomiting is uncontrollable, call your health care provider.  You may tire easily.  You may develop headaches that can be relieved by medicines approved by your health care provider.  You may urinate more often. Painful urination may mean you have a bladder infection.  You may develop heartburn as a result of your pregnancy.  You may develop constipation because certain hormones are causing the muscles that push waste through your intestines to slow down.  You may develop hemorrhoids or swollen, bulging veins (varicose veins).  Your breasts may begin to grow larger and become tender. Your nipples may stick out more, and the tissue that  surrounds them (areola) may become darker.  Your gums may bleed and may be sensitive to brushing and flossing.  Dark spots or blotches (chloasma, mask of pregnancy) may develop on your face. This will likely fade after the baby is born.  Your menstrual periods will stop.  You may have a loss of appetite.  You may develop cravings for certain kinds of food.  You may have changes in your emotions from day to day, such as being excited to be pregnant or being concerned that something may go wrong with the pregnancy and baby.  You may have more vivid and strange dreams.  You may have changes in your hair. These can include thickening of your hair, rapid growth, and changes in texture. Some women also have hair loss during or after pregnancy, or hair that feels dry or thin. Your hair will most likely return to normal after your baby is born. WHAT TO EXPECT AT YOUR PRENATAL VISITS During a routine prenatal visit:  You will be weighed to make sure you and the baby are growing normally.  Your blood pressure will be taken.  Your abdomen will be measured to track your baby's growth.  The fetal heartbeat will be listened to starting around week 10 or 12 of your pregnancy.  Test results from any previous visits will be discussed. Your health care provider may ask you:  How you are feeling.  If you are feeling the baby move.  If you have had any abnormal symptoms, such as leaking fluid, bleeding, severe headaches, or abdominal cramping.  If you are using any tobacco products, including cigarettes, chewing tobacco, and electronic cigarettes.  If you have any questions. Other tests that may be performed during your first trimester include:  Blood tests to find your blood type and to check for the presence of any previous infections. They will also be used to check for low iron levels (anemia) and Rh antibodies. Later in the pregnancy, blood tests for diabetes will be done along with other  tests if problems develop.  Urine tests to check for infections, diabetes, or protein in the urine.  An ultrasound to confirm the proper growth and development of the baby.  An amniocentesis to check for possible genetic problems.  Fetal screens for spina bifida and Down syndrome.  You may need other tests to make sure you and the baby are doing well.  HIV (human immunodeficiency virus) testing. Routine prenatal testing includes screening for HIV, unless you choose not to have this test. HOME CARE INSTRUCTIONS  Medicines  Follow your health care provider's instructions regarding medicine use. Specific medicines may be either safe or unsafe to take during pregnancy.  Take your prenatal vitamins as directed.  If you develop constipation, try taking a stool softener if your health care provider approves. Diet  Eat regular, well-balanced meals. Choose a variety of foods, such as meat or vegetable-based protein, fish, milk and low-fat dairy products, vegetables, fruits, and whole grain breads and cereals. Your health care provider will help you determine the amount of weight gain that is right for you.  Avoid raw meat and uncooked cheese. These carry germs that can cause birth defects in the baby.  Eating four or five small meals rather than three large meals a day may help relieve nausea and vomiting. If you start to feel nauseous, eating a few soda crackers can be helpful. Drinking liquids between meals instead of during meals also seems to help nausea and vomiting.  If you develop constipation, eat more high-fiber foods, such as fresh vegetables or fruit and whole grains. Drink enough fluids to keep your urine clear or pale yellow. Activity and Exercise  Exercise only as directed by your health care provider. Exercising will help you:  Control your weight.  Stay in shape.  Be prepared for labor and delivery.  Experiencing pain or cramping in the lower abdomen or low back is a  good sign that you should stop exercising. Check with your health care provider before continuing normal exercises.  Try to avoid standing for long periods of time. Move your legs often if you must stand in one place for a long time.  Avoid heavy lifting.  Wear low-heeled shoes, and practice good posture.  You may continue to have sex unless your health care provider directs you otherwise. Relief of Pain or Discomfort  Wear a good support bra for breast tenderness.   Take warm sitz baths to soothe any pain or discomfort caused by hemorrhoids. Use hemorrhoid cream if your health care provider approves.   Rest with your legs elevated if you have leg cramps or low back pain.  If you develop varicose veins in your legs, wear support hose. Elevate your feet for 15 minutes, 3-4 times a day. Limit salt in your diet. Prenatal Care  Schedule your prenatal visits by the twelfth week of pregnancy. They are usually scheduled monthly at first, then more often in the last  2 months before delivery.  Write down your questions. Take them to your prenatal visits.  Keep all your prenatal visits as directed by your health care provider. Safety  Wear your seat belt at all times when driving.  Make a list of emergency phone numbers, including numbers for family, friends, the hospital, and police and fire departments. General Tips  Ask your health care provider for a referral to a local prenatal education class. Begin classes no later than at the beginning of month 6 of your pregnancy.  Ask for help if you have counseling or nutritional needs during pregnancy. Your health care provider can offer advice or refer you to specialists for help with various needs.  Do not use hot tubs, steam rooms, or saunas.  Do not douche or use tampons or scented sanitary pads.  Do not cross your legs for long periods of time.  Avoid cat litter boxes and soil used by cats. These carry germs that can cause birth  defects in the baby and possibly loss of the fetus by miscarriage or stillbirth.  Avoid all smoking, herbs, alcohol, and medicines not prescribed by your health care provider. Chemicals in these affect the formation and growth of the baby.  Do not use any tobacco products, including cigarettes, chewing tobacco, and electronic cigarettes. If you need help quitting, ask your health care provider. You may receive counseling support and other resources to help you quit.  Schedule a dentist appointment. At home, brush your teeth with a soft toothbrush and be gentle when you floss. SEEK MEDICAL CARE IF:   You have dizziness.  You have mild pelvic cramps, pelvic pressure, or nagging pain in the abdominal area.  You have persistent nausea, vomiting, or diarrhea.  You have a bad smelling vaginal discharge.  You have pain with urination.  You notice increased swelling in your face, hands, legs, or ankles. SEEK IMMEDIATE MEDICAL CARE IF:   You have a fever.  You are leaking fluid from your vagina.  You have spotting or bleeding from your vagina.  You have severe abdominal cramping or pain.  You have rapid weight gain or loss.  You vomit blood or material that looks like coffee grounds.  You are exposed to [place] measles and have never had them.  You are exposed to fifth disease or chickenpox.  You develop a severe headache.  You have shortness of breath.  You have any kind of trauma, such as from a fall or a car accident.   This information is not intended to replace advice given to you by your health care provider. Make sure you discuss any questions you have with your health care provider.   Document Released: 12/24/2000 Document Revised: 01/20/2014 Document Reviewed: 11/09/2012 Elsevier Interactive Patient Education [employer]2016 Elsevier Inc.

## 2015-01-24 NOTE — Progress Notes (Signed)
Rachel Lloyd is a 30 y.o. yo [phone] at 1651w6d by 8 week U/S (>2 weeks difference from approximately LMP) who presents for her initial prenatal visit. Pregnancy is not planned. Patient has had multiple abortions in the past, most recently 05/2014. When asked if she was going to continue with this pregnancy she states she "has not choice" as she cannot afford an abortion. She notes that she has wanted a BTL since her first pregnancy and has not yet gotten one due to not signing papers in time.  FOB (Tito) is not involved with pregnancy as he's currently incarcerated.  She reports nausea and vomiting, patient endorses difficulty keeping luquids down much of the day. Has not gone to pharmacy to get Reglan, Phenergan, or vit B-6 that were prescribed to her for this.. She  is not taking PNV, financial restraints (has family planning, applying for pregnancy Medicaid).   Patient continues to have vaginal discharge that is malodorous. Was diagnosed with BV on 12/28 and has not been over to the pharmacy to obtain prescription. See flow sheet for details.  PMH, POBH, FH, meds, allergies and Social Hx reviewed. Specifically: no current medications, no active health issues, former smoker (recently quit), no alcohol or recreational drugs.  Prenatal Exam: Gen: Well nourished, well developed.  No distress.  Vitals noted. HEENT: Normocephalic, atraumatic.  Neck supple without cervical lymphadenopathy, thyromegaly or thyroid nodules.  Fair dentition. CV: RRR no murmur, gallops or rubs Lungs: CTAB.  Normal respiratory effort without wheezes or rales. Abd: soft, NTND. +BS.  Uterus not appreciated above pelvis. GU: deferred as she recently had pelvic exam during this pregnancy. Ext: No clubbing, cyanosis or edema. Psych: Normal grooming and dress.  Not depressed or anxious appearing.  Normal thought content and process without flight of ideas or looseness of associations.  Assessment & Plan: 1) [age] y.o. yo [id number] at  451w6d via early ultrasound doing well- per patient report, if it were not for financial constraints she would have an elective abortion. Current pregnancy issues include: would like to have an abortion if financially feasible. Dating is reliable. Prenatal labs not yet done, ordered today- ordered without GC/chlamdyia (negative early in pregnancy on 12/28) and pap smear up to date.  Genetic screening offered: yes, declined. Early glucola is indicated.  Does not yet have Medicaid will need to complete pregnancy home once established.   Bleeding and pain precautions reviewed. Importance of prenatal vitamins reviewed.  Provided information on abortion sites and possible financial assistance a for this as well.  Follow up in 4 weeks. Discussed with Drs. Gwendolyn GrantWalden and Pollie MeyerMcIntyre.

## 2015-01-25 LAB — OBSTETRIC PANEL
Antibody Screen: NEGATIVE
Basophils Absolute: 0 10*3/uL (ref 0.0–0.1)
Basophils Relative: 0 % (ref 0–1)
Eosinophils Absolute: 0.1 10*3/uL (ref 0.0–0.7)
Eosinophils Relative: 1 % (ref 0–5)
HCT: 38 % (ref 36.0–46.0)
Hemoglobin: 13.3 g/dL (ref 12.0–15.0)
Hepatitis B Surface Ag: NEGATIVE
Lymphocytes Relative: 22 % (ref 12–46)
Lymphs Abs: 1.3 10*3/uL (ref 0.7–4.0)
MCH: 30.6 pg (ref 26.0–34.0)
MCHC: 35 g/dL (ref 30.0–36.0)
MCV: 87.4 fL (ref 78.0–100.0)
MPV: 9.4 fL (ref 8.6–12.4)
Monocytes Absolute: 0.4 10*3/uL (ref 0.1–1.0)
Monocytes Relative: 7 % (ref 3–12)
Neutro Abs: 4.2 10*3/uL (ref 1.7–7.7)
Neutrophils Relative %: 70 % (ref 43–77)
Platelets: 273 10*3/uL (ref 150–400)
RBC: 4.35 MIL/uL (ref 3.87–5.11)
RDW: 13 % (ref 11.5–15.5)
Rh Type: POSITIVE
Rubella: 6.89 Index — ABNORMAL HIGH (ref <0.90)
WBC: 6 10*3/uL (ref 4.0–10.5)

## 2015-01-25 LAB — HIV ANTIBODY (ROUTINE TESTING W REFLEX): HIV 1&2 Ab, 4th Generation: NONREACTIVE

## 2015-01-25 LAB — CULTURE, OB URINE: Colony Count: >=100000

## 2015-01-26 ENCOUNTER — Telehealth: Payer: Self-pay | Admitting: Family Medicine

## 2015-01-26 DIAGNOSIS — R300 Dysuria: Secondary | ICD-10-CM

## 2015-01-26 MED ORDER — AZITHROMYCIN 250 MG PO TABS: ORAL_TABLET | ORAL | Status: DC

## 2015-01-26 NOTE — Telephone Encounter (Signed)
Patient with Corynebacterium on urine culture. Noted abdominal pain and patient is pregnant. Has an anaphylaxis reaction to PCN. Cannot recall if she's taken cephalosporins. Spoke with inpatient pharmacist as this is unusual to see in urine. Will start a Z-pack. Patient called and voiced understanding.  Joanna Puffrystal S. Dorsey, MD [facility] Res[facility]  01/26/2015, 5:24 PM

## 2015-01-26 NOTE — Telephone Encounter (Signed)
ID pharmacist called back, noted that she would recommend re-culturing urine with sensitivities, prior to treatment due to concerns that if she is treated and doesn't respond, she may need hospitalization. Called and informed patient, also called and cancelled prescription. Urine culture re-ordered with instructions to run sensitivities.   Joanna Puffrystal S. Luis Sami, MD [facility] Res[facility]  01/26/2015, 5:35 PM

## 2015-01-30 ENCOUNTER — Other Ambulatory Visit: Payer: Medicaid Other

## 2015-01-30 DIAGNOSIS — R300 Dysuria: Secondary | ICD-10-CM

## 2015-01-30 NOTE — Progress Notes (Signed)
Ob urine culture done today [facility] Rachel Lloyd

## 2015-02-01 ENCOUNTER — Telehealth: Payer: Self-pay | Admitting: Family Medicine

## 2015-02-01 LAB — CULTURE, OB URINE
Colony Count: NO GROWTH
Organism ID, Bacteria: NO GROWTH

## 2015-02-01 NOTE — Telephone Encounter (Signed)
Repeat urine culture is negative. Called patient and let her know. Voiced understanding.  Joanna Puff, MD [facility] Resident  02/01/2015, 8:34 AM

## 2015-02-07 ENCOUNTER — Encounter: Payer: Self-pay | Admitting: *Deleted

## 2015-02-19 ENCOUNTER — Inpatient Hospital Stay (HOSPITAL_COMMUNITY)
Admission: AD | Admit: 2015-02-19 | Discharge: 2015-02-19 | Disposition: A | Discharge status: Home / Self Care | Payer: Medicaid Other | Source: Ambulatory Visit | Attending: Obstetrics and Gynecology | Admitting: Obstetrics and Gynecology

## 2015-02-19 DIAGNOSIS — O99341 Other mental disorders complicating pregnancy, first trimester: Secondary | ICD-10-CM | POA: Insufficient documentation

## 2015-02-19 DIAGNOSIS — F319 Bipolar disorder, unspecified: Secondary | ICD-10-CM | POA: Diagnosis not present

## 2015-02-19 DIAGNOSIS — M549 Dorsalgia, unspecified: Secondary | ICD-10-CM | POA: Diagnosis present

## 2015-02-19 DIAGNOSIS — O43892 Other placental disorders, second trimester: Secondary | ICD-10-CM

## 2015-02-19 DIAGNOSIS — Z3A13 13 weeks gestation of pregnancy: Secondary | ICD-10-CM | POA: Diagnosis not present

## 2015-02-19 DIAGNOSIS — O468X2 Other antepartum hemorrhage, second trimester: Secondary | ICD-10-CM

## 2015-02-19 DIAGNOSIS — Z87891 Personal history of nicotine dependence: Secondary | ICD-10-CM | POA: Insufficient documentation

## 2015-02-19 DIAGNOSIS — O4691 Antepartum hemorrhage, unspecified, first trimester: Secondary | ICD-10-CM | POA: Insufficient documentation

## 2015-02-19 DIAGNOSIS — N939 Abnormal uterine and vaginal bleeding, unspecified: Secondary | ICD-10-CM | POA: Diagnosis present

## 2015-02-19 DIAGNOSIS — O418X20 Other specified disorders of amniotic fluid and membranes, second trimester, not applicable or unspecified: Secondary | ICD-10-CM

## 2015-02-19 NOTE — Discharge Instructions (Signed)
Subchorionic Hematoma °A subchorionic hematoma is a gathering of blood between the outer wall of the placenta and the inner wall of the womb (uterus). The placenta is the organ that connects the fetus to the wall of the uterus. The placenta performs the feeding, breathing (oxygen to the fetus), and waste removal (excretory work) of the fetus.  °Subchorionic hematoma is the most common abnormality found on a result from ultrasonography done during the first trimester or early second trimester of pregnancy. If there has been little or no vaginal bleeding, early small hematomas usually shrink on their own and do not affect your baby or pregnancy. The blood is gradually absorbed over 1-2 weeks. When bleeding starts later in pregnancy or the hematoma is larger or occurs in an older pregnant woman, the outcome may not be as good. Larger hematomas may get bigger, which increases the chances for miscarriage. Subchorionic hematoma also increases the risk of premature detachment of the placenta from the uterus, preterm (premature) labor, and stillbirth. °HOME CARE INSTRUCTIONS °· Stay on bed rest if your health care provider recommends this. Although bed rest will not prevent more bleeding or prevent a miscarriage, your health care provider may recommend bed rest until you are advised otherwise. °· Avoid heavy lifting (more than 10 lb [4.5 kg]), exercise, sexual intercourse, or douching as directed by your health care provider. °· Keep track of the number of pads you use each day and how soaked (saturated) they are. Write down this information. °· Do not use tampons. °· Keep all follow-up appointments as directed by your health care provider. Your health care provider may ask you to have follow-up blood tests or ultrasound tests or both. °SEEK IMMEDIATE MEDICAL CARE IF: °· You have severe cramps in your stomach, back, abdomen, or pelvis. °· You have a fever. °· You pass large clots or tissue. Save any tissue for your health  care provider to look at. °· Your bleeding increases or you become lightheaded, feel weak, or have fainting episodes. °  °This information is not intended to replace advice given to you by your health care provider. Make sure you discuss any questions you have with your health care provider. °  °Document Released: 04/16/2006 Document Revised: 01/20/2014 Document Reviewed: 07/29/2012 °Elsevier Interactive Patient Education ©2016 Elsevier Inc. ° °

## 2015-02-19 NOTE — MAU Note (Signed)
Urine sent to lab 

## 2015-02-19 NOTE — MAU Note (Signed)
Pt c/o brownish discharge when wiping for the past few days. Today it is on underwear. Lower abdominal cramping that started with brown discharge but worse today. mostly on left side. Rates 7/10. Took tylenol yesterday-helped some.

## 2015-02-19 NOTE — MAU Provider Note (Signed)
History     CSN: [id number]  Arrival date and time: 02/19/15 2110   First Provider Initiated Contact with Patient 02/19/15 2218      Chief Complaint  Patient presents with  . Vaginal Bleeding  . Abdominal Cramping  . Back Pain   HPI Ms. Rachel Lloyd is a 30 y.o. [phone] at [redacted]w[redacted]d who presents to MAU today with complaint of vaginal bleeding x 3 days. The patient states brown spotting only with associated intermittent lower abdominal cramping. She denies bright red blood. She has only required a panty liner. She has tried Tylenol occasionally for pain. She is also currently being treated for BV with Flagyl.   OB History    Gravida Para Term Preterm AB TAB SAB Ectopic Multiple Living   0 3 3 0 0 0 3      Past Medical History  Diagnosis Date  . Mental disorder 2010    bipolar  . Scabies exposure 2012    treated july 2012 & reexposed  . Hypertension     During second pregnancy  . Pneumonia   . Chlamydia     Past Surgical History  Procedure Laterality Date  . Mouth surgery      as a child  . Wisdom tooth extraction    . Induced abortion      Family History  Problem Relation Age of Onset  . Anesthesia problems Neg Hx   . Hypertension Mother   . Cancer Mother   . Fibroids Mother   . ADD / ADHD Brother   . Hypertension Maternal Grandmother     Social History  Substance Use Topics  . Smoking status: Former Smoker    Types: Cigarettes    Quit date: 04/04/2010  . Smokeless tobacco: Never Used  . Alcohol Use: No     Comment: quit when found out was pregnant    Allergies:  Allergies  Allergen Reactions  . Penicillins Anaphylaxis    Has patient had a PCN reaction causing immediate rash, facial/tongue/throat swelling, SOB or lightheadedness with hypotension: Yes Has patient had a PCN reaction causing severe rash involving mucus membranes or skin necrosis: Yes Has patient had a PCN reaction that required hospitalization Yes Has patient had a PCN reaction  occurring within the last 10 years: No If all of the above answers are "NO", then may proceed with Cephalosporin use.  . Latex Itching and Rash    No prescriptions prior to admission    Review of Systems  Constitutional: Negative for fever and malaise/fatigue.  Gastrointestinal: Positive for abdominal pain. Negative for nausea, vomiting, diarrhea and constipation.  Genitourinary: Negative for dysuria, urgency and frequency.       + spotting   Physical Exam   Blood pressure 105/64, pulse 84, temperature 97.9 F (36.6 C), temperature source Oral, resp. rate 20, height  (1.626 m), weight 136 lb 12.8 oz (62.052 kg), last menstrual period 10/28/2014, SpO2 99 %, unknown if currently breastfeeding.  Physical Exam  Nursing note and vitals reviewed. Constitutional: She is oriented to person, place, and time. She appears well-developed and well-nourished. No distress.  HENT:  Head: Normocephalic and atraumatic.  Cardiovascular: Normal rate.   Respiratory: Effort normal.  GI: Soft. She exhibits no distension and no mass. There is no tenderness. There is no rebound and no guarding.  Genitourinary:  Scant brown on panty liner  Neurological: She is alert and oriented to person, place, and time.  Skin: Skin is warm and  dry. No erythema.  Psychiatric: She has a normal mood and affect.     MAU Course  Procedures  MDM Patient had viability US performed around [redacted] weeks GA showing SIUP with normal FHR and small SCH FHR unable to obtain with doppler Limited bedside US performed to assess FHR  +FHR appears normal on [place]. Dr. Audrea Muscat present for exam.   Assessment and Plan  A: SIUP at [redacted]w[redacted]d Vaginal bleeding in pregnancy, second trimester Small subchorionic hemorrhage  P: Discharge home Bleeding precautions discussed Pelvic rest Patient advised to follow-up with [facility] as scheduled for routine prenatal care or sooner Patient may return to MAU as needed or if her condition were to  change or worsen   Marny Lowenstein, PA-C  02/19/2015, 10:36 PM

## 2015-02-21 ENCOUNTER — Inpatient Hospital Stay (HOSPITAL_COMMUNITY): Payer: Medicaid Other

## 2015-02-21 ENCOUNTER — Encounter (HOSPITAL_COMMUNITY): Payer: Self-pay | Admitting: *Deleted

## 2015-02-21 ENCOUNTER — Inpatient Hospital Stay (HOSPITAL_COMMUNITY)
Admission: AD | Admit: 2015-02-21 | Discharge: 2015-02-22 | Disposition: A | Discharge status: Home / Self Care | Payer: Medicaid Other | Source: Ambulatory Visit | Attending: Obstetrics & Gynecology | Admitting: Obstetrics & Gynecology

## 2015-02-21 ENCOUNTER — Ambulatory Visit (INDEPENDENT_AMBULATORY_CARE_PROVIDER_SITE_OTHER): Payer: Medicaid Other | Admitting: Family Medicine

## 2015-02-21 ENCOUNTER — Encounter: Payer: Self-pay | Admitting: Family Medicine

## 2015-02-21 VITALS — BP 112/60 | HR 74 | Temp 98.7°F | Wt 132.0 lb

## 2015-02-21 DIAGNOSIS — Z3A13 13 weeks gestation of pregnancy: Secondary | ICD-10-CM | POA: Diagnosis not present

## 2015-02-21 DIAGNOSIS — Z880 Allergy status to penicillin: Secondary | ICD-10-CM | POA: Insufficient documentation

## 2015-02-21 DIAGNOSIS — O039 Complete or unspecified spontaneous abortion without complication: Secondary | ICD-10-CM | POA: Diagnosis not present

## 2015-02-21 DIAGNOSIS — O209 Hemorrhage in early pregnancy, unspecified: Secondary | ICD-10-CM

## 2015-02-21 DIAGNOSIS — Z91040 Latex allergy status: Secondary | ICD-10-CM | POA: Diagnosis not present

## 2015-02-21 DIAGNOSIS — Z8659 Personal history of other mental and behavioral disorders: Secondary | ICD-10-CM | POA: Diagnosis not present

## 2015-02-21 DIAGNOSIS — Z87891 Personal history of nicotine dependence: Secondary | ICD-10-CM | POA: Insufficient documentation

## 2015-02-21 LAB — CBC
HCT: 35.5 % — ABNORMAL LOW (ref 36.0–46.0)
Hemoglobin: 12.4 g/dL (ref 12.0–15.0)
MCH: 30.8 pg (ref 26.0–34.0)
MCHC: 34.9 g/dL (ref 30.0–36.0)
MCV: 88.1 fL (ref 78.0–100.0)
Platelets: 264 10*3/uL (ref 150–400)
RBC: 4.03 MIL/uL (ref 3.87–5.11)
RDW: 12.8 % (ref 11.5–15.5)
WBC: 8.4 10*3/uL (ref 4.0–10.5)

## 2015-02-21 MED ORDER — LACTATED RINGERS IV BOLUS (SEPSIS)
1000.0000 mL | Freq: Once | INTRAVENOUS | Status: AC
  Administered 2015-02-21: 1000 mL via INTRAVENOUS

## 2015-02-21 MED ORDER — MISOPROSTOL 200 MCG PO TABS: ORAL_TABLET | ORAL | Status: DC

## 2015-02-21 MED ORDER — OXYCODONE-ACETAMINOPHEN 5-325 MG PO TABS: 1.0000 | ORAL_TABLET | Freq: Four times a day (QID) | ORAL | Status: DC | PRN

## 2015-02-21 MED ORDER — OXYCODONE-ACETAMINOPHEN 5-325 MG PO TABS
2.0000 | ORAL_TABLET | Freq: Once | ORAL | Status: AC
  Administered 2015-02-21: 2 via ORAL
  Filled 2015-02-21: qty 2

## 2015-02-21 MED ORDER — HYDROMORPHONE HCL 2 MG/ML IJ SOLN
2.0000 mg | Freq: Once | INTRAMUSCULAR | Status: AC
  Administered 2015-02-21: 2 mg via INTRAVENOUS
  Filled 2015-02-21: qty 1

## 2015-02-21 NOTE — Patient Instructions (Signed)
Do to your continued bleeding, I recommend you go over to the women's admissions unit (MAU) at the [facility] hospital for additional evaluation and ultrasound.

## 2015-02-21 NOTE — MAU Note (Signed)
Pt stood up to get dressed for home. Some bleeding along with some tissue passed. Dr. Emelda Fear made aware and examined the POC. Specimen to be sent to pathology.

## 2015-02-21 NOTE — MAU Provider Note (Signed)
Chief Complaint: Vaginal Bleeding   First Provider Initiated Contact with Patient 02/21/15 1716      SUBJECTIVE HPI: Rachel Lloyd is a 30 y.o. [id number] at [redacted]w[redacted]d by LMP who presents to maternity admissions reporting increased vaginal bleeding and abdominal cramping today.  She was seen 2 days ago in MAU with spotting and had bedside US showing FHT present.  She started bleeding more heavily this morning and by this afternoon has required a pad with some small clots. She has not fully soaked a pad but has dark red bleeding on 1/2 pad every couple of hours.  Her pain is cramping lower abdominal pain that is intermittent. She has not taken any medications for pain. Nothing makes the pain better or worse.   She denies vaginal itching/burning, urinary symptoms, h/a, dizziness, n/v, or fever/chills.     HPI  Past Medical History  Diagnosis Date  . Mental disorder 2010    bipolar  . Scabies exposure 2012    treated july 2012 & reexposed  . Hypertension     During second pregnancy  . Pneumonia   . Chlamydia    Past Surgical History  Procedure Laterality Date  . Mouth surgery      as a child  . Wisdom tooth extraction    . Induced abortion     Social History   Social History  . Marital Status: Single    Spouse Name: N/A  . Number of Children: N/A  . Years of Education: N/A   Occupational History  . Not on file.   Social History Main Topics  . Smoking status: Former Smoker    Types: Cigarettes    Quit date: 04/04/2010  . Smokeless tobacco: Never Used  . Alcohol Use: No     Comment: quit when found out was pregnant  . Drug Use: No  . Sexual Activity: Yes    Birth Control/ Protection: None   Other Topics Concern  . Not on file   Social History Narrative   No current facility-administered medications on file prior to encounter.   Current Outpatient Prescriptions on File Prior to Encounter  Medication Sig Dispense Refill  . metroNIDAZOLE (FLAGYL) 500 MG tablet Take 1  tablet (500 mg total) by mouth 2 (two) times daily. (Patient taking differently: Take 500 mg by mouth 2 (two) times daily. 2-2 to 2-9) 14 tablet 0  . Prenatal Vit-Fe Fumarate-FA (PRENATAL VITAMIN) 27-0.8 MG TABS Take 1 tablet by mouth daily. 30 tablet 11  . promethazine (PHENERGAN) 12.5 MG tablet Take 1 tablet (12.5 mg total) by mouth every 6 (six) hours as needed for nausea or vomiting. 30 tablet 0  . azithromycin (ZITHROMAX) 250 MG tablet Use as indicated on pack for 5 days total. (Patient not taking: Reported on 02/21/2015) 6 each 0  . metoCLOPramide (REGLAN) 10 MG tablet Take 1 tablet (10 mg total) by mouth every 6 (six) hours. (Patient not taking: Reported on 02/21/2015) 30 tablet 0  . [DISCONTINUED] ranitidine (ZANTAC) 150 MG tablet Take 1 tablet (150 mg total) by mouth 2 (two) times daily. (Patient not taking: Reported on 01/16/2014) 60 tablet 0   Allergies  Allergen Reactions  . Penicillins Anaphylaxis    Has patient had a PCN reaction causing immediate rash, facial/tongue/throat swelling, SOB or lightheadedness with hypotension: Yes Has patient had a PCN reaction causing severe rash involving mucus membranes or skin necrosis: Yes Has patient had a PCN reaction that required hospitalization Yes Has patient had a PCN reaction  occurring within the last 10 years: No If all of the above answers are "NO", then may proceed with Cephalosporin use.  . Latex Itching and Rash    ROS:  Review of Systems  Constitutional: Negative for fever, chills and fatigue.  Respiratory: Negative for shortness of breath.   Cardiovascular: Negative for chest pain.  Gastrointestinal: Positive for abdominal pain.  Genitourinary: Positive for vaginal bleeding and pelvic pain. Negative for dysuria, flank pain, vaginal discharge, difficulty urinating and vaginal pain.  Neurological: Negative for dizziness and headaches.  Psychiatric/Behavioral: Negative.      I have reviewed patient's Past Medical Hx, Surgical Hx,  Family Hx, Social Hx, medications and allergies.   Physical Exam  Patient Vitals for the past 24 hrs:  BP Temp Temp src Pulse Resp  02/21/15 1709 117/68 mmHg 99.3 F (37.4 C) Oral 85 18   Constitutional: Well-developed, well-nourished female in no acute distress.  Cardiovascular: normal rate Respiratory: normal effort GI: Abd soft, non-tender. Pos BS x 4 MS: Extremities nontender, no edema, normal ROM Neurologic: Alert and oriented x 4.  GU: Neg CVAT.  PELVIC EXAM: Cervix pink, visually closed, without lesion, small to moderate amount dark red bleeding with small clots, vaginal walls and external genitalia normal Bimanual exam: Cervix 0/long/high, firm, anterior, neg CMT, uterus nontender, slightly enlarged, adnexa without tenderness, enlargement, or mass   LAB RESULTS Results for orders placed or performed during the hospital encounter of 02/21/15 (from the past 24 hour(s))  CBC     Status: Abnormal   Collection Time: 02/21/15  5:22 PM  Result Value Ref Range   WBC 8.4 4.0 - 10.5 K/uL   RBC 4.03 3.87 - 5.11 MIL/uL   Hemoglobin 12.4 12.0 - 15.0 g/dL   HCT 16.1 (L) 09.6 - 04.5 %   MCV 88.1 78.0 - 100.0 fL   MCH 30.8 26.0 - 34.0 pg   MCHC 34.9 30.0 - 36.0 g/dL   RDW 40.9 81.1 - 91.4 %   Platelets 264 150 - 400 K/uL    A/POS/-- (01/11 0950)  IMAGING US Ob Comp Less 14 Wks  02/21/2015  CLINICAL DATA:  Negative fetal heart tones. Known small subchorionic hemorrhage. EXAM: OBSTETRIC <14 WK ULTRASOUND TECHNIQUE: Transabdominal ultrasound was performed for evaluation of the gestation as well as the maternal uterus and adnexal regions. COMPARISON:  01/11/2015 [facility] sonogram. FINDINGS: Intrauterine gestational sac: Single. Yolk sac:  None. Embryo:  Present. Cardiac Activity: None. Heart Rate: Not applicable. CRL:   2.02 cm     8 w 4 d                  [place] EDC: 09/29/2015. Subchorionic hemorrhage:  None visualized. Maternal uterus/adnexae: Normal RIGHT ovary. LEFT ovary not  visualized. Patient declined transvaginal exam due to pain. No free fluid seen on transabdominal. IMPRESSION: Absence of a embryo with a heartbeat greater than 11 days after a previous scan which showed a gestational sac with a yolk sac and a visualized embryo with a heartbeat. Findings meet definitive criteria for failed pregnancy. This follows SRU consensus guidelines: Diagnostic Criteria for Nonviable Pregnancy Early in the First Trimester. [facility] [phone]. Electronically Signed   By: Elsie Stain M.D.   On: 02/21/2015 18:39    MAU Management/MDM: Ordered labs and reviewed results.  With [place] indicating loss measuring [redacted]w[redacted]d, not likely that heart tones were present 2 days ago in MAU.  Likely inevitable SAB.  Pt becoming more uncomfortable in MAU so Percocet  5/325 x 2 tabs given. No pain relief with pt pain increasing and bleeding continuing.  IV started and LR x 1000 ml and Dilaudid 2 mg IV given with significant reduction in pain.  Consult Dr Emelda Fear.  Plan to reexamine pt.    Dr Emelda Fear to bedside. Pt declined exam, as pain significantly improved with IV pain medicine.  Dr Emelda Fear discussed [place] results and answered pt and family questions.  Plan for pt to follow up at [facility] if desired.  D/C home with Cyototec 800 mcg oral dose x 1, follow up in office in 1 week.  Rx for Percocet for pain management and pt to take Phenergan for nausea as prescribed. Pt stable at time of discharge.  ASSESSMENT 1. SAB (spontaneous abortion)   2. Vaginal bleeding in pregnancy, first trimester     PLAN Discharge home with bleeding precautions Cytotec 800 mcg oral dose x 1 Percocet 5/325, take 1-2 Q 6 hours PRN x 15 tabs F/U at [facility] or [facility] in 1 week as desired Return to MAU as needed for emergencies    Medication List    STOP taking these medications        acetaminophen 500 MG tablet  Commonly known as:  TYLENOL     azithromycin 250 MG tablet  Commonly known as:  ZITHROMAX      metoCLOPramide 10 MG tablet  Commonly known as:  REGLAN     metroNIDAZOLE 500 MG tablet  Commonly known as:  FLAGYL      TAKE these medications        misoprostol 200 MCG tablet  Commonly known as:  CYTOTEC  Place all 4 tablets into your cheek at one time and let them dissolve.     oxyCODONE-acetaminophen 5-325 MG tablet  Commonly known as:  PERCOCET/ROXICET  Take 1-2 tablets by mouth every 6 (six) hours as needed.     Prenatal Vitamin 27-0.8 MG Tabs  Take 1 tablet by mouth daily.     promethazine 12.5 MG tablet  Commonly known as:  PHENERGAN  Take 1 tablet (12.5 mg total) by mouth every 6 (six) hours as needed for nausea or vomiting.           Follow-up Information    Follow up with [facility] In 1 week.   Specialty:  Family Medicine   Why:  Return to MAU as needed for emergencies   Contact information:   [address] [id number] mc [place] [address] [phone]      Sharen Counter Certified Nurse-Midwife 02/21/2015  8:34 PM

## 2015-02-21 NOTE — Assessment & Plan Note (Signed)
3 days of vaginal bleeding, now with abdominal cramping at [redacted] weeks gestation, concerning for threatened abortion. Unable to obtain FHT in clinic.  Recent ultrasound showing small subchorionic hemorrhage.  - Referred to [facility] for ultrasound to determine fetal viability

## 2015-02-21 NOTE — Progress Notes (Signed)
   Subjective:    Patient ID: Rachel Lloyd, female    DOB: 06/11/1985, 30 y.o.   MRN: [id number]  Seen for Same day visit for   CC: bleeding during pregnancy  She reports continue bleeding since her visit to maternal admission unit for pregnancy related bleeding 2 days ago.  Additionally, she reports abdominal and lower back cramps that started 2 days ago and have been getting stronger.  She denies any fevers, chills, dysuria.  Review of Systems   See HPI for ROS. Objective:  BP 112/60 mmHg  Pulse 74  Temp(Src) 98.7 F (37.1 C) (Oral)  Wt 132 lb (59.875 kg)  LMP 10/28/2014 (Approximate)  General: NAD Cardiac: RRR, normal heart sounds, no murmurs. 2+ radial and PT pulses bilaterally Abdomen: soft, nontender, nondistended Bowel sounds present.  Doppler: Unable to obtain FHT  Assessment & Plan:   Vaginal bleeding in pregnant patient at less than [redacted] weeks gestation 3 days of vaginal bleeding, now with abdominal cramping at [redacted] weeks gestation, concerning for threatened abortion. Unable to obtain FHT in clinic.  Recent ultrasound showing small subchorionic hemorrhage.  - Referred to [facility] for ultrasound to determine fetal viability

## 2015-02-21 NOTE — Discharge Instructions (Signed)
Miscarriage  A miscarriage is the sudden loss of an unborn baby (fetus) before the 20th week of pregnancy. Most miscarriages happen in the first 3 months of pregnancy. Sometimes, it happens before a woman even knows she is pregnant. A miscarriage is also called a "spontaneous miscarriage" or "early pregnancy loss." Having a miscarriage can be an emotional experience. Talk with your caregiver about any questions you may have about miscarrying, the grieving process, and your future pregnancy plans.  CAUSES    Problems with the fetal chromosomes that make it impossible for the baby to develop normally. Problems with the baby's genes or chromosomes are most often the result of errors that occur, by chance, as the embryo divides and grows. The problems are not inherited from the parents.   Infection of the cervix or uterus.    Hormone problems.    Problems with the cervix, such as having an incompetent cervix. This is when the tissue in the cervix is not strong enough to hold the pregnancy.    Problems with the uterus, such as an abnormally shaped uterus, uterine fibroids, or congenital abnormalities.    Certain medical conditions.    Smoking, drinking alcohol, or taking illegal drugs.    Trauma.   Often, the cause of a miscarriage is unknown.   SYMPTOMS    Vaginal bleeding or spotting, with or without cramps or pain.   Pain or cramping in the abdomen or lower back.   Passing fluid, tissue, or blood clots from the vagina.  DIAGNOSIS   Your caregiver will perform a physical exam. You may also have an ultrasound to confirm the miscarriage. Blood or urine tests may also be ordered.  TREATMENT    Sometimes, treatment is not necessary if you naturally pass all the fetal tissue that was in the uterus. If some of the fetus or placenta remains in the body (incomplete miscarriage), tissue left behind may become infected and must be removed. Usually, a dilation and curettage (D and C) procedure is performed.  During a D and C procedure, the cervix is widened (dilated) and any remaining fetal or placental tissue is gently removed from the uterus.   Antibiotic medicines are prescribed if there is an infection. Other medicines may be given to reduce the size of the uterus (contract) if there is a lot of bleeding.   If you have Rh negative blood and your baby was Rh positive, you will need a Rh immunoglobulin shot. This shot will protect any future baby from having Rh blood problems in future pregnancies.  HOME CARE INSTRUCTIONS    Your caregiver may order bed rest or may allow you to continue light activity. Resume activity as directed by your caregiver.   Have someone help with home and family responsibilities during this time.    Keep track of the number of sanitary pads you use each day and how soaked (saturated) they are. Write down this information.    Do not use tampons. Do not douche or have sexual intercourse until approved by your caregiver.    Only take over-the-counter or prescription medicines for pain or discomfort as directed by your caregiver.    Do not take aspirin. Aspirin can cause bleeding.    Keep all follow-up appointments with your caregiver.    If you or your partner have problems with grieving, talk to your caregiver or seek counseling to help cope with the pregnancy loss. Allow enough time to grieve before trying to get pregnant again.     SEEK IMMEDIATE MEDICAL CARE IF:    You have severe cramps or pain in your back or abdomen.   You have a fever.   You pass large blood clots (walnut-sized or larger) ortissue from your vagina. Save any tissue for your caregiver to inspect.    Your bleeding increases.    You have a thick, bad-smelling vaginal discharge.   You become lightheaded, weak, or you faint.    You have chills.   MAKE SURE YOU:   Understand these instructions.   Will watch your condition.   Will get help right away if you are not doing well or get worse.     This  information is not intended to replace advice given to you by your health care provider. Make sure you discuss any questions you have with your health care provider.     Document Released: 06/25/2000 Document Revised: 04/26/2012 Document Reviewed: 02/18/2011  Elsevier Interactive Patient Education 2016 [employer]

## 2015-02-21 NOTE — MAU Note (Signed)
Was seen in MAU recently with bleeding. States U/S was used to located FHR. States was seen today at [facility] and no heartbeat could be heard with doppler. States bleeding is more today, but currently there is no bleeding on her pad. Unable to hear FHT's with doppler in MAU today.

## 2015-02-22 ENCOUNTER — Telehealth: Payer: Self-pay | Admitting: Medical

## 2015-02-22 NOTE — Telephone Encounter (Signed)
Opened in error

## 2015-05-01 ENCOUNTER — Ambulatory Visit: Payer: Self-pay | Admitting: Family Medicine

## 2015-05-31 ENCOUNTER — Ambulatory Visit (INDEPENDENT_AMBULATORY_CARE_PROVIDER_SITE_OTHER): Payer: Medicaid Other | Admitting: Family Medicine

## 2015-05-31 ENCOUNTER — Encounter: Payer: Self-pay | Admitting: Family Medicine

## 2015-05-31 ENCOUNTER — Other Ambulatory Visit (HOSPITAL_COMMUNITY)
Admission: RE | Admit: 2015-05-31 | Discharge: 2015-05-31 | Disposition: A | Discharge status: Home / Self Care | Payer: Medicaid Other | Source: Ambulatory Visit | Attending: Family Medicine | Admitting: Family Medicine

## 2015-05-31 VITALS — BP 107/62 | HR 89 | Temp 98.7°F | Ht 64.0 in | Wt 132.8 lb

## 2015-05-31 DIAGNOSIS — N760 Acute vaginitis: Secondary | ICD-10-CM | POA: Diagnosis not present

## 2015-05-31 DIAGNOSIS — Z113 Encounter for screening for infections with a predominantly sexual mode of transmission: Secondary | ICD-10-CM

## 2015-05-31 DIAGNOSIS — N921 Excessive and frequent menstruation with irregular cycle: Secondary | ICD-10-CM

## 2015-05-31 DIAGNOSIS — Z8742 Personal history of other diseases of the female genital tract: Secondary | ICD-10-CM

## 2015-05-31 DIAGNOSIS — Z8759 Personal history of other complications of pregnancy, childbirth and the puerperium: Secondary | ICD-10-CM

## 2015-05-31 DIAGNOSIS — B9689 Other specified bacterial agents as the cause of diseases classified elsewhere: Secondary | ICD-10-CM

## 2015-05-31 DIAGNOSIS — A499 Bacterial infection, unspecified: Secondary | ICD-10-CM

## 2015-05-31 DIAGNOSIS — N898 Other specified noninflammatory disorders of vagina: Secondary | ICD-10-CM

## 2015-05-31 DIAGNOSIS — R350 Frequency of micturition: Secondary | ICD-10-CM

## 2015-05-31 LAB — POCT UA - MICROSCOPIC ONLY

## 2015-05-31 LAB — POCT URINALYSIS DIPSTICK
Bilirubin, UA: NEGATIVE
Glucose, UA: NEGATIVE
Ketones, UA: NEGATIVE
Nitrite, UA: NEGATIVE
Protein, UA: NEGATIVE
Spec Grav, UA: 1.02
Urobilinogen, UA: 1
pH, UA: 7

## 2015-05-31 LAB — POCT WET PREP (WET MOUNT)
Clue Cells Wet Prep Whiff POC: POSITIVE
WBC, Wet Prep HPF POC: >20

## 2015-05-31 LAB — POCT URINE PREGNANCY: Preg Test, Ur: NEGATIVE

## 2015-05-31 MED ORDER — METRONIDAZOLE 500 MG PO TABS: 500.0000 mg | ORAL_TABLET | Freq: Two times a day (BID) | ORAL | Status: DC

## 2015-05-31 NOTE — Progress Notes (Signed)
Subjective:    Patient ID: Rachel DevoidAshley D Murdock, female    DOB: 1985/12/01, 30 y.o.   MRN: [id number]  Rachel Devoidshley D Lloyd is a 30 y.o. female presenting on 05/31/2015 for Vaginal Discharge   Patient presents for a same day appointment.   HPI  Vaginal Discharge / Urinary Frequency / STD Screening: - Patient presents with vaginal discharge x 3-4 days without itching. Does admit to urinary frequency and some odor but without dysuria, for past 2 days. - Sexual history with 1 female partner (no new partners >6 months), no condom use, no contraception, no known STD exposure. Last STD testing GC/Chlamdyia 12/2014 negative, HIV/RPR non-reactive 01/2015 - Recent pregnancy history with miscarriage/SAB, see below  MENORRHAGIA, S/p SAB after >3 months gestation - History of G7P3043 with recent pregnancy ended with SAB in 02/2015 after [redacted]wk gestation, presented to MAU with heavy bleeding, abdominal cramping, had US confirming loss at >8w, inevitable SAB, given cytotec 800mcg x 1 dose, patient also reports "passing the fetus" status whole thing "came out". Per her report she did not follow-up as advised with OB/GYN. - She admits to having persistent heavier bleeding since this episode, about twice a month with irregular cycles now, still has cramps with bleeding. - Sexually active as above, requesting pregnancy test - Denies any fevers/chills, abdominal pain, dyspareunia, nausea vomiting   Social History  Substance Use Topics  . Smoking status: Former Smoker    Types: Cigarettes    Quit date: 04/04/2010  . Smokeless tobacco: Never Used  . Alcohol Use: No     Comment: quit when found out was pregnant    Review of Systems Per HPI unless specifically indicated above     Objective:    BP 107/62 mmHg  Pulse 89  Temp(Src) 98.7 F (37.1 C) (Oral)  Ht 5\' 4"  (1.626 m)  Wt 132 lb 12.8 oz (60.238 kg)  BMI 22.78 kg/m2  LMP 10/28/2014 (Approximate)  Wt Readings from Last 3 Encounters:  05/31/15 132 lb 12.8  oz (60.238 kg)  02/21/15 132 lb (59.875 kg)  02/19/15 136 lb 12.8 oz (62.052 kg)    Physical Exam  Constitutional: She appears well-developed and well-nourished. No distress.  Well-appearing, comfortable, cooperative  HENT:  Mouth/Throat: Oropharynx is clear and moist.  Cardiovascular: Normal rate, regular rhythm, normal heart sounds and intact distal pulses.   No murmur heard. Pulmonary/Chest: Effort normal and breath sounds normal. No respiratory distress. She has no wheezes. She has no rales.  Abdominal: Soft. Bowel sounds are normal. She exhibits no distension and no mass. There is tenderness (Mild suprapubic and Right lower quad discomfort). There is no rebound and no guarding.  Non-gravid uterus.  Genitourinary:  Normal external female genitalia. Vaginal canal without lesions. Normal appearing cervix, without lesions or bleeding. Mild increased discharge on exam. Bimanual exam without masses or cervical motion tenderness.  Pelvic Exam chaperoned by Maree Erieeseree Blount, CMA  Neurological: She is alert.  Skin: Skin is warm and dry. No rash noted. She is not diaphoretic.  Nursing note and vitals reviewed.  Results for orders placed or performed in visit on 05/31/15  POCT urinalysis dipstick  Result Value Ref Range   Color, UA YELLOW    Clarity, UA CLEAR    Glucose, UA NEG    Bilirubin, UA NEG    Ketones, UA NEG    Spec Grav, UA 1.020    Blood, UA SMALL    pH, UA 7.0    Protein, UA NEG  Urobilinogen, UA 1.0    Nitrite, UA NEG    Leukocytes, UA small (1+) (A) Negative  POCT urine pregnancy  Result Value Ref Range   Preg Test, Ur Negative Negative  POCT Wet Prep ([place]et [place])  Result Value Ref Range   Source Wet Prep POC VAG    WBC, Wet Prep HPF POC >20    Bacteria Wet Prep HPF POC Many (A) None, Few, Too numerous to count   Clue Cells Wet Prep HPF POC Many (A) None, Too numerous to count   Clue Cells Wet Prep Whiff POC Positive Whiff    Yeast Wet Prep HPF POC None     Trichomonas Wet Prep HPF POC NONE   POCT UA - Microscopic Only  Result Value Ref Range   WBC, Ur, HPF, POC 5-10    RBC, urine, microscopic 0-3    Bacteria, U Microscopic 2+    Epithelial cells, urine per micros 10-15       Assessment & Plan:   Problem List Items Addressed This Visit    Vaginal discharge - Primary    Secondary to BV - Treat with Flagyl      Relevant Orders   POCT Wet Prep [facility]) (Completed)   Cervicovaginal ancillary only   Screening for STD (sexually transmitted disease)    Check GC/Chlamydia per request, last negative 12/2014, same partner but no condom use. Opted out of HIV/RPR, last negative 01/2015 - Wet prep negative for trich      Relevant Orders   Cervicovaginal ancillary only   Menorrhagia with irregular cycle    Likely related to recent SAB, confirmed urine pregnancy test negative today. Complicated history with multiple pregnancies in past, prior surgical and medical abortions electively. Not on any contraception or hormone therapy. - Check Pelvic US evaluate any retained products of conception (seems unlikely given well and no evidence of infection, >3 mo) - May take more time to resume normal cycle, otherwise consider contraception options      Relevant Orders   US Transvaginal Non-OB   US Pelvis Complete   History of spontaneous abortion, not currently pregnant    Confirmed urine pregnancy test negative today. Complicated history with multiple pregnancies in past, prior surgical and medical abortions electively. Not on any contraception or hormone therapy. - Check Pelvic US evaluate any retained products of conception (seems unlikely given well and no evidence of infection, >3 mo) - May take more time to resume normal cycle, otherwise consider contraception options      Relevant Orders   POCT urine pregnancy (Completed)   US Transvaginal Non-OB   US Pelvis Complete    Other Visit Diagnoses    Urinary frequency        UA not indicative  of infection, considered urine culture, hold on antibiotics    Relevant Orders    POCT urinalysis dipstick (Completed)    POCT UA - Microscopic Only (Completed)    BV (bacterial vaginosis)        Confirmed on Wet prep. Likely etiology of her discharge symptoms and cramping, without condom use. Treat with Flagyl.    Relevant Medications    metroNIDAZOLE (FLAGYL) 500 MG tablet       Meds ordered this encounter  Medications  . metroNIDAZOLE (FLAGYL) 500 MG tablet    Sig: Take 1 tablet (500 mg total) by mouth 2 (two) times daily. Do not drink alcohol while taking this medicine.    Dispense:  14 tablet  Refill:  0      Follow up plan: Return in about 4 weeks (around 06/28/2015) for menorrhagia, irregular period.  Saralyn Pilar, DO [facility], PGY-3

## 2015-05-31 NOTE — Patient Instructions (Addendum)
Thank you for coming in to clinic today.  1. Your Urine pregnancy test is NEGATIVE - Urinalysis test looks normal - no significant sign of Urinary Tract infection, there is some blood in the urine - You did not have a lot of discharge on exam, no abnormalities that I could see - Waiting on results of microscope test for BV or Yeast - Ordered Pelvic ultrasound to see if any retained birth products from your recent miscarriage - Awaiting results of STD testing as requested, may take  afew days  Recommend to use condoms with intercourse. Discuss birth control options with primary doctor at next visit.  The bleeding may take longer to return to normal.  Please schedule a follow-up appointment with Dr Leonides Schanzorsey within 1 month to follow-up Pelvic US and Menorrhagia, may need to follow-up with GYN in future to discuss procedural options  If you have any other questions or concerns, please feel free to call the clinic to contact me. You may also schedule an earlier appointment if necessary.  However, if your symptoms get significantly worse, please go to the Emergency Department to seek immediate medical attention.  Saralyn PilarAlexander Karamalegos, DO [facility]

## 2015-06-01 ENCOUNTER — Telehealth: Payer: Self-pay | Admitting: Family Medicine

## 2015-06-01 DIAGNOSIS — Z8759 Personal history of other complications of pregnancy, childbirth and the puerperium: Secondary | ICD-10-CM | POA: Insufficient documentation

## 2015-06-01 DIAGNOSIS — A749 Chlamydial infection, unspecified: Secondary | ICD-10-CM | POA: Insufficient documentation

## 2015-06-01 DIAGNOSIS — N921 Excessive and frequent menstruation with irregular cycle: Secondary | ICD-10-CM | POA: Insufficient documentation

## 2015-06-01 LAB — CERVICOVAGINAL ANCILLARY ONLY
Chlamydia: POSITIVE — AB
Neisseria Gonorrhea: NEGATIVE

## 2015-06-01 MED ORDER — DOXYCYCLINE HYCLATE 100 MG PO CAPS: 100.0000 mg | ORAL_CAPSULE | Freq: Two times a day (BID) | ORAL | Status: DC

## 2015-06-01 NOTE — Addendum Note (Signed)
Addended by: Smitty CordsKARAMALEGOS, ALEXANDER J on: 06/01/2015 04:34 PM   Modules accepted: Orders

## 2015-06-01 NOTE — Assessment & Plan Note (Signed)
Confirmed urine pregnancy test negative today. Complicated history with multiple pregnancies in past, prior surgical and medical abortions electively. Not on any contraception or hormone therapy. - Check Pelvic US evaluate any retained products of conception (seems unlikely given well and no evidence of infection, >3 mo) - May take more time to resume normal cycle, otherwise consider contraception options

## 2015-06-01 NOTE — Telephone Encounter (Signed)
Patient advise of the msg below.

## 2015-06-01 NOTE — Telephone Encounter (Signed)
Last saw patient 5/18 for office visit, performed wet prep among other tests. Patient unable to stay for results. It showed positive Bacterial Vaginosis or BV.  Sent rx for Metronidazole antibiotic to her pharmacy, 1 pill twice a day for 7 days, not to drink alcohol on this medication. We had preemptively discussed this medication during visit prior to positive results.  Called patient today 5/19, to let her know that this test was positive and the above medication was sent.  Still pending other STD testing, Ultrasound. She is to follow-up with PCP for vaginal bleeding after US results within 3-4 weeks.  Rachel PilarAlexander Shaneika Rossa, DO [facility], PGY-3

## 2015-06-01 NOTE — Assessment & Plan Note (Signed)
Check GC/Chlamydia per request, last negative 12/2014, same partner but no condom use. Opted out of HIV/RPR, last negative 01/2015 - Wet prep negative for trich

## 2015-06-01 NOTE — Telephone Encounter (Signed)
UPDATE 06/01/15  Results on STD screening - Chlamydia POSITIVE, gonorrhea negative.  Called patient back, she was aware of prior wet prep results, had not picked up Metronidazole yet. I advised her of positive chlamydia test. Will send in Doxycycline 100mg  BID x 7 days, can take with Flagyl. Caution if GI upset or can't tolerate, will need to present to clinic for one dose treatment with Azithromycin 1g x 1. Avoid sexual intercourse for up to 2 weeks after start treatment. Partner needs to be [facility] or his doctor. Recommended use condoms.  Will forward result to Dorisann Framesamika Martin, RN to notify [facility] as usual.  Saralyn PilarAlexander Monserat Prestigiacomo, DO [facility], PGY-3

## 2015-06-01 NOTE — Assessment & Plan Note (Signed)
Secondary to BV - Treat with Flagyl
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
## 2015-06-05 NOTE — Telephone Encounter (Signed)
[facility]  Clovis PuMartin, Tamika L, RN

## 2015-06-06 ENCOUNTER — Ambulatory Visit (HOSPITAL_COMMUNITY)
Admission: RE | Admit: 2015-06-06 | Discharge: 2015-06-06 | Disposition: A | Discharge status: Home / Self Care | Payer: Medicaid Other | Source: Ambulatory Visit | Attending: Family Medicine | Admitting: Family Medicine

## 2015-06-06 ENCOUNTER — Telehealth: Payer: Self-pay | Admitting: Family Medicine

## 2015-06-06 DIAGNOSIS — N921 Excessive and frequent menstruation with irregular cycle: Secondary | ICD-10-CM | POA: Insufficient documentation

## 2015-06-06 DIAGNOSIS — Z8759 Personal history of other complications of pregnancy, childbirth and the puerperium: Secondary | ICD-10-CM

## 2015-06-06 DIAGNOSIS — Z8742 Personal history of other diseases of the female genital tract: Secondary | ICD-10-CM | POA: Insufficient documentation

## 2015-06-06 NOTE — Telephone Encounter (Signed)
Patient called back and is aware of ultrasound results.  Beautifull Cisar,CMA

## 2015-06-06 NOTE — Telephone Encounter (Signed)
Last saw patient office visit 05/31/15, see note for details. Since that visit, patient has already been contacted about positive BV and Chlamydia results, and on appropriate antibiotic treatment.  Today attempted to call patient about Pelvic US results, see below.  06/06/15 Transvaginal Pelvic Complete US IMPRESSION: 1. No uterine fibroids. 2. Bilayer endometrial thickness 6 mm. Trace fluid in the fundal endometrial cavity. No focal endometrial mass. No evidence of retained products of conception. 3. No suspicious ovarian or adnexal findings.  Did not reach patient, and did not leave voicemail. If patient calls back, please share the following results/message: 1. Pelvic US was normal. Did not show any retained "products of conception" from her prior miscarriage. Ovaries appear normal. No fibroids. No other obvious cause of her menstrual irregularity or bleeding.  She may follow-up with PCP after few weeks of finishing the recent antibiotics to discuss future contraception / hormonal options to help her menstrual irregularity, otherwise she may return to OB/GYN office to discuss other options.  Saralyn PilarAlexander Draken Farrior, DO [facility], PGY-3

## 2015-06-15 ENCOUNTER — Other Ambulatory Visit: Payer: Self-pay | Admitting: *Deleted

## 2015-06-15 DIAGNOSIS — A749 Chlamydial infection, unspecified: Secondary | ICD-10-CM

## 2015-06-15 DIAGNOSIS — N760 Acute vaginitis: Secondary | ICD-10-CM

## 2015-06-15 DIAGNOSIS — B9689 Other specified bacterial agents as the cause of diseases classified elsewhere: Secondary | ICD-10-CM

## 2015-06-15 MED ORDER — METRONIDAZOLE 500 MG PO TABS: 500.0000 mg | ORAL_TABLET | Freq: Two times a day (BID) | ORAL | Status: DC

## 2015-06-15 MED ORDER — DOXYCYCLINE HYCLATE 100 MG PO CAPS: 100.0000 mg | ORAL_CAPSULE | Freq: Two times a day (BID) | ORAL | Status: DC

## 2015-06-15 NOTE — Telephone Encounter (Signed)
Mailbox full unable to leave message for patient. [name],CMA

## 2015-06-15 NOTE — Telephone Encounter (Signed)
Patient states that she [place] her antibiotics while [place] the beach and would like to get a new script sent in for both the doxycycline and her flagyl.  She only took 4 days worth of medication and was also only taking 1 pill of both a day since it was making her nauseated.  Please advise. Rachel Lloyd,CMA

## 2015-06-16 ENCOUNTER — Encounter (HOSPITAL_COMMUNITY): Payer: Self-pay | Admitting: Emergency Medicine

## 2015-06-16 ENCOUNTER — Emergency Department (HOSPITAL_COMMUNITY)
Admission: EM | Admit: 2015-06-16 | Discharge: 2015-06-16 | Disposition: A | Discharge status: Home / Self Care | Payer: No Typology Code available for payment source | Attending: Emergency Medicine | Admitting: Emergency Medicine

## 2015-06-16 DIAGNOSIS — M545 Low back pain, unspecified: Secondary | ICD-10-CM

## 2015-06-16 DIAGNOSIS — Y999 Unspecified external cause status: Secondary | ICD-10-CM | POA: Diagnosis not present

## 2015-06-16 DIAGNOSIS — Y939 Activity, unspecified: Secondary | ICD-10-CM | POA: Diagnosis not present

## 2015-06-16 DIAGNOSIS — Z87891 Personal history of nicotine dependence: Secondary | ICD-10-CM | POA: Diagnosis not present

## 2015-06-16 DIAGNOSIS — Z91040 Latex allergy status: Secondary | ICD-10-CM | POA: Insufficient documentation

## 2015-06-16 DIAGNOSIS — Y92410 Unspecified street and highway as the place of occurrence of the external cause: Secondary | ICD-10-CM | POA: Insufficient documentation

## 2015-06-16 DIAGNOSIS — Z79899 Other long term (current) drug therapy: Secondary | ICD-10-CM | POA: Insufficient documentation
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
## 2015-08-01 ENCOUNTER — Encounter (HOSPITAL_COMMUNITY): Payer: Self-pay | Admitting: Emergency Medicine

## 2015-08-01 ENCOUNTER — Emergency Department (HOSPITAL_COMMUNITY)
Admission: EM | Admit: 2015-08-01 | Discharge: 2015-08-01 | Disposition: A | Discharge status: Home / Self Care | Payer: Medicaid Other | Attending: Emergency Medicine | Admitting: Emergency Medicine

## 2015-08-01 DIAGNOSIS — I10 Essential (primary) hypertension: Secondary | ICD-10-CM | POA: Diagnosis not present

## 2015-08-01 DIAGNOSIS — Z87891 Personal history of nicotine dependence: Secondary | ICD-10-CM | POA: Diagnosis not present

## 2015-08-01 DIAGNOSIS — K0889 Other specified disorders of teeth and supporting structures: Secondary | ICD-10-CM | POA: Diagnosis present

## 2015-08-01 DIAGNOSIS — K029 Dental caries, unspecified: Secondary | ICD-10-CM | POA: Insufficient documentation

## 2015-08-01 DIAGNOSIS — F319 Bipolar disorder, unspecified: Secondary | ICD-10-CM | POA: Insufficient documentation

## 2015-08-01 MED ORDER — NAPROXEN 500 MG PO TABS: 500.0000 mg | ORAL_TABLET | Freq: Two times a day (BID) | ORAL | Status: DC

## 2015-08-01 MED ORDER — LIDOCAINE VISCOUS 2 % MT SOLN: 15.0000 mL | OROMUCOSAL | Status: DC | PRN

## 2015-08-01 MED ORDER — BUPIVACAINE-EPINEPHRINE (PF) 0.5% -1:200000 IJ SOLN
1.8000 mL | Freq: Once | INTRAMUSCULAR | Status: AC
  Administered 2015-08-01: 1.8 mL
  Filled 2015-08-01: qty 1.8

## 2015-08-01 MED ORDER — CLINDAMYCIN HCL 150 MG PO CAPS: 450.0000 mg | ORAL_CAPSULE | Freq: Three times a day (TID) | ORAL | Status: DC

## 2015-08-01 NOTE — ED Provider Notes (Signed)
CSN: [id number]     Arrival date & time 08/01/15  1557 History  By signing my name below, I, Bridgette Habermann, attest that this documentation has been prepared under the direction and in the presence of Phoua Hoadley, PA-C. Electronically Signed: Bridgette Habermann, ED Scribe. 08/01/2015. 5:20 PM.     Chief Complaint  Patient presents with  . Dental Pain   The history is provided by the patient. No language interpreter was used.    HPI Comments: Rachel Lloyd is a 30 y.o. female who presents to the Emergency Department complaining of gradual onset, constant, worsening, 10/10 right lower dental pain onset 2 days ago. Pt has tried Orajel with no relief. Per pt, she was suppose to get the tooth pulled by her dentist 4 years ago but she has been pregnant every year. Pt is not currently pregnant or breast feeding. Has not seen a dentist for her issue today. Pt denies fever/chills, N/V, difficulty breathing or swallowing, or any other complaints.     Past Medical History  Diagnosis Date  . Mental disorder 2010    bipolar  . Scabies exposure 2012    treated july 2012 & reexposed  . Hypertension     During second pregnancy  . Pneumonia   . Chlamydia    Past Surgical History  Procedure Laterality Date  . Mouth surgery      as a child  . Wisdom tooth extraction    . Induced abortion     Family History  Problem Relation Age of Onset  . Anesthesia problems Neg Hx   . Hypertension Mother   . Cancer Mother   . Fibroids Mother   . ADD / ADHD Brother   . Hypertension Maternal Grandmother    Social History  Substance Use Topics  . Smoking status: Former Smoker    Types: Cigarettes    Quit date: 04/04/2010  . Smokeless tobacco: Never Used  . Alcohol Use: No     Comment: quit when found out was pregnant   OB History    Gravida Para Term Preterm AB TAB SAB Ectopic Multiple Living   0 3 3 0 0 0 3     Review of Systems  Constitutional: Negative for fever and chills.  HENT: Positive for dental  problem. Negative for facial swelling, sore throat, trouble swallowing and voice change.   Gastrointestinal: Negative for nausea and vomiting.  Neurological: Negative for headaches.      Allergies  Penicillins and Latex  Home Medications   Prior to Admission medications   Medication Sig Start Date End Date Taking? Authorizing Provider  clindamycin (CLEOCIN) 150 MG capsule Take 3 capsules (450 mg total) by mouth 3 (three) times daily. 08/01/15   Roslyn Else C Beaulah Romanek, PA-C  doxycycline (VIBRAMYCIN) 100 MG capsule Take 1 capsule (100 mg total) by mouth 2 (two) times daily. For 7 days 06/15/15   Smitty Cords, DO  ibuprofen (ADVIL,MOTRIN) 800 MG tablet Take 800 mg by mouth daily as needed for moderate pain.    Historical Provider, MD  lidocaine (XYLOCAINE) 2 % solution Use as directed 15 mLs in the mouth or throat as needed for mouth pain. 08/01/15   Seanmichael Salmons C Haralambos Yeatts, PA-C  metroNIDAZOLE (FLAGYL) 500 MG tablet Take 1 tablet (500 mg total) by mouth 2 (two) times daily. Do not drink alcohol while taking this medicine. 06/15/15   Smitty Cords, DO  naproxen (NAPROSYN) 500 MG tablet Take 1 tablet (500 mg total)  by mouth 2 (two) times daily. 08/01/15   Hector Venne C Deward Sebek, PA-C   BP 152/86 mmHg  Pulse 48  Temp(Src) 98.5 F (36.9 C) (Oral)  Resp 20  SpO2 95%  LMP 10/28/2014 (Approximate)  Breastfeeding? Unknown Physical Exam  Constitutional: She appears well-developed and well-nourished. No distress.  HENT:  Head: Normocephalic and atraumatic.  Significant dental carries to the right mandibular first molar. No discernable swelling. No area of fluctuance to suggest an abscess.  Eyes: Conjunctivae are normal.  Neck: Normal range of motion. Neck supple.  No swelling noted to the soft tissues of the neck.  Cardiovascular: Normal rate and regular rhythm.   Pulmonary/Chest: Effort normal. No respiratory distress.  Abdominal: She exhibits no distension.  Musculoskeletal: Normal range of motion.   Lymphadenopathy:    She has no cervical adenopathy.  Neurological: She is alert.  Skin: Skin is warm and dry. She is not diaphoretic.  Psychiatric: She has a normal mood and affect. Her behavior is normal.  Nursing note and vitals reviewed.   ED Course  .Nerve Block Date/Time: 08/01/2015 5:59 PM Performed by: Anselm PancoastJOY, Reynalda Canny C Authorized by: Anselm PancoastJOY, Myrtis Maille C Consent: Verbal consent obtained. Risks and benefits: risks, benefits and alternatives were discussed Consent given by: patient Patient understanding: patient states understanding of the procedure being performed Patient consent: the patient's understanding of the procedure matches consent given Procedure consent: procedure consent matches procedure scheduled Patient identity confirmed: verbally with patient and arm band Indications: pain relief Body area: face/mouth Nerve: inferior alveolar Laterality: right Patient sedated: no Patient position: supine Needle gauge: 25 G Location technique: anatomical landmarks Local anesthetic: bupivacaine 0.5% with epinephrine Anesthetic total: 1.8 ml Outcome: pain improved Patient tolerance: Patient tolerated the procedure well with no immediate complications    DIAGNOSTIC STUDIES: Oxygen Saturation is 95% on RA, poor by my interpretation.    COORDINATION OF CARE: 5:19 PM Discussed treatment plan with pt at bedside which includes numbing injection and Rx of antibiotics and pt agreed to plan.  MDM   Final diagnoses:  Pain due to dental caries    Rachel DevoidAshley D Lloyd presents with dental pain for the past two days.   No signs of ludwigs angioedema, sepsis, or drainable abscess. Pt to follow up with dentist. The patient was given instructions for home care as well as return precautions. Patient voices understanding of these instructions, accepts the plan, and is comfortable with discharge.  Filed Vitals:   08/01/15 1641 08/01/15 1807  BP: 152/86 134/77  Pulse: 48 50  Temp: 98.5 F (36.9  C) 98.5 F (36.9 C)  TempSrc: Oral Oral  Resp: 20 20  SpO2: 95% 100%     I personally performed the services described in this documentation, which was scribed in my presence. The recorded information has been reviewed and is accurate.   Anselm PancoastShawn C La Shehan, PA-C 08/02/15 1553   Cathren LaineKevin Steinl, MD 08/07/15 1311

## 2015-08-01 NOTE — Discharge Instructions (Signed)
You have been seen today for tooth pain. Follow up with a dentist as soon as possible. Referred to the resource guide below. The pain will not stop until a dentist treats the tooth. We are not able to prescribe narcotic pain medications for dental pain.    Dental Resource Guide  [facility] [address], [address] [place], [place] [address] [phone]  [facility] [address] [place], [place] [address] [phone]  [facility] [address] [place], [place] [address] [phone]  [facility] [address] [place], [place] [address] [phone]  [facility] [address] [place], [place] [address] [phone]  [facility] [email or web address]  [facility] [employer] [facility] [employer] [address] [place], [place] [address] [phone]  Website for free, low-income, or sliding scale dental services in [place]: www.freedental.us  To find a dentist in [place] and surrounding areas: [email or web address]  [facility] Test[employer].[email or web address]/[email or web address]/-missions-[email or web address]-mercy  [facility] Dentist http://[email or web address]://[email or web address]/find-[email or web address]/[email or web address]

## 2015-08-01 NOTE — ED Notes (Signed)
Patient with right lower dental pain for 2 days.  She said she was told to get the tooth pulled 4 years ago, but she has been pregnant every year.  Swelling present patient reports pain as over a 10.

## 2015-09-26 ENCOUNTER — Inpatient Hospital Stay (HOSPITAL_COMMUNITY): Payer: Medicaid Other

## 2015-09-26 ENCOUNTER — Inpatient Hospital Stay (HOSPITAL_COMMUNITY)
Admission: AD | Admit: 2015-09-26 | Discharge: 2015-09-26 | Disposition: A | Discharge status: Home / Self Care | Payer: Medicaid Other | Source: Ambulatory Visit | Attending: Family Medicine | Admitting: Family Medicine

## 2015-09-26 ENCOUNTER — Encounter (HOSPITAL_COMMUNITY): Payer: Self-pay | Admitting: *Deleted

## 2015-09-26 DIAGNOSIS — E860 Dehydration: Secondary | ICD-10-CM | POA: Diagnosis not present

## 2015-09-26 DIAGNOSIS — B3731 Acute candidiasis of vulva and vagina: Secondary | ICD-10-CM

## 2015-09-26 DIAGNOSIS — O210 Mild hyperemesis gravidarum: Secondary | ICD-10-CM | POA: Diagnosis present

## 2015-09-26 DIAGNOSIS — R102 Pelvic and perineal pain: Secondary | ICD-10-CM

## 2015-09-26 DIAGNOSIS — O26891 Other specified pregnancy related conditions, first trimester: Secondary | ICD-10-CM

## 2015-09-26 DIAGNOSIS — O23591 Infection of other part of genital tract in pregnancy, first trimester: Secondary | ICD-10-CM | POA: Insufficient documentation

## 2015-09-26 DIAGNOSIS — O219 Vomiting of pregnancy, unspecified: Secondary | ICD-10-CM | POA: Diagnosis not present

## 2015-09-26 DIAGNOSIS — O98811 Other maternal infectious and parasitic diseases complicating pregnancy, first trimester: Secondary | ICD-10-CM | POA: Insufficient documentation

## 2015-09-26 DIAGNOSIS — Z87891 Personal history of nicotine dependence: Secondary | ICD-10-CM | POA: Diagnosis not present

## 2015-09-26 DIAGNOSIS — Z3491 Encounter for supervision of normal pregnancy, unspecified, first trimester: Secondary | ICD-10-CM

## 2015-09-26 DIAGNOSIS — Z3A01 Less than 8 weeks gestation of pregnancy: Secondary | ICD-10-CM | POA: Insufficient documentation

## 2015-09-26 DIAGNOSIS — B373 Candidiasis of vulva and vagina: Secondary | ICD-10-CM

## 2015-09-26 DIAGNOSIS — O26899 Other specified pregnancy related conditions, unspecified trimester: Secondary | ICD-10-CM

## 2015-09-26 DIAGNOSIS — N760 Acute vaginitis: Secondary | ICD-10-CM | POA: Diagnosis not present

## 2015-09-26 DIAGNOSIS — B9689 Other specified bacterial agents as the cause of diseases classified elsewhere: Secondary | ICD-10-CM | POA: Diagnosis not present

## 2015-09-26 LAB — WET PREP, GENITAL
Clue Cells Wet Prep HPF POC: NONE SEEN
Sperm: NONE SEEN
Trich, Wet Prep: NONE SEEN

## 2015-09-26 LAB — CBC
HCT: 37 % (ref 36.0–46.0)
Hemoglobin: 13.1 g/dL (ref 12.0–15.0)
MCH: 30.4 pg (ref 26.0–34.0)
MCHC: 35.4 g/dL (ref 30.0–36.0)
MCV: 85.8 fL (ref 78.0–100.0)
Platelets: 302 10*3/uL (ref 150–400)
RBC: 4.31 MIL/uL (ref 3.87–5.11)
RDW: 12.8 % (ref 11.5–15.5)
WBC: 9.9 10*3/uL (ref 4.0–10.5)

## 2015-09-26 LAB — URINE MICROSCOPIC-ADD ON

## 2015-09-26 LAB — URINALYSIS, ROUTINE W REFLEX MICROSCOPIC
Bilirubin Urine: NEGATIVE
Glucose, UA: NEGATIVE mg/dL
Ketones, ur: 15 mg/dL — AB
Nitrite: NEGATIVE
Protein, ur: 100 mg/dL — AB
Specific Gravity, Urine: 1.02 (ref 1.005–1.030)
pH: 6 (ref 5.0–8.0)

## 2015-09-26 LAB — HIV ANTIBODY (ROUTINE TESTING W REFLEX): HIV Screen 4th Generation wRfx: NONREACTIVE

## 2015-09-26 LAB — HCG, QUANTITATIVE, PREGNANCY: hCG, Beta Chain, Quant, S: 103092 m[IU]/mL — ABNORMAL HIGH (ref <5)

## 2015-09-26 LAB — POCT PREGNANCY, URINE: Preg Test, Ur: POSITIVE — AB

## 2015-09-26 LAB — RPR: RPR Ser Ql: NONREACTIVE

## 2015-09-26 MED ORDER — FAMOTIDINE 20 MG PO TABS: 20.0000 mg | ORAL_TABLET | Freq: Every day | ORAL | 1 refills | Status: DC

## 2015-09-26 MED ORDER — PROMETHAZINE HCL 25 MG PO TABS: 25.0000 mg | ORAL_TABLET | Freq: Four times a day (QID) | ORAL | 1 refills | Status: DC | PRN

## 2015-09-26 MED ORDER — PROMETHAZINE HCL 25 MG/ML IJ SOLN
25.0000 mg | Freq: Once | INTRAVENOUS | Status: AC
  Administered 2015-09-26: 25 mg via INTRAVENOUS
  Filled 2015-09-26: qty 1

## 2015-09-26 MED ORDER — FAMOTIDINE IN NACL 20-0.9 MG/50ML-% IV SOLN
20.0000 mg | Freq: Once | INTRAVENOUS | Status: AC
  Administered 2015-09-26: 20 mg via INTRAVENOUS
  Filled 2015-09-26: qty 50

## 2015-09-26 MED ORDER — METRONIDAZOLE 500 MG PO TABS: 500.0000 mg | ORAL_TABLET | Freq: Two times a day (BID) | ORAL | 0 refills | Status: DC

## 2015-09-26 MED ORDER — TERCONAZOLE 0.4 % VA CREA: 1.0000 | TOPICAL_CREAM | Freq: Every day | VAGINAL | 0 refills | Status: DC

## 2015-09-26 NOTE — MAU Note (Signed)
Pt states she has been vomiting for three days now.  Cant keep even water down.  Pt states her urine has a very bad odor to it.  Last week had two positive UPT.  Lost her baby in Feb.  Also c/o left sided abd pain for three days as well.  Denies vaginal bleeding or abnormal discharge.

## 2015-09-26 NOTE — MAU Provider Note (Signed)
History     CSN: [id number]  Arrival date and time: 09/26/15 [id number]   First Provider Initiated Contact with Patient 09/26/15 [phone]      Chief Complaint  Patient presents with  . Emesis  . Abdominal Pain   [id number] @[redacted]w[redacted]d  by LMP c/o N/V x3 days. Unable to tolerate food or fluids. No fevers. She also c/o intermittent sharp lower abdominal pain x4-5 days. She denies dysuria but reports polyuria. She also reports malodorous vagina x4-5 days. She denies VB, vaginal discharge and itching.     OB History    Gravida Para Term Preterm AB Living   8 3 3  0 3 3   SAB TAB Ectopic Multiple Live Births   0 3 0 0 3      Past Medical History:  Diagnosis Date  . Chlamydia   . Hypertension    During second pregnancy  . Mental disorder 2010   bipolar  . Pneumonia   . Scabies exposure 2012   treated july 2012 & reexposed    Past Surgical History:  Procedure Laterality Date  . INDUCED ABORTION    . MOUTH SURGERY     as a child  . WISDOM TOOTH EXTRACTION      Family History  Problem Relation Age of Onset  . Hypertension Mother   . Cancer Mother   . Fibroids Mother   . ADD / ADHD Brother   . Hypertension Maternal Grandmother   . Anesthesia problems Neg Hx     Social History  Substance Use Topics  . Smoking status: Former Smoker    Types: Cigarettes    Quit date: 04/04/2010  . Smokeless tobacco: Never Used  . Alcohol use No     Comment: quit when found out was pregnant    Allergies:  Allergies  Allergen Reactions  . Penicillins Anaphylaxis    Has patient had a PCN reaction causing immediate rash, facial/tongue/throat swelling, SOB or lightheadedness with hypotension: Yes Has patient had a PCN reaction causing severe rash involving mucus membranes or skin necrosis: Yes Has patient had a PCN reaction that required hospitalization Yes Has patient had a PCN reaction occurring within the last 10 years: No If all of the above answers are "NO", then may proceed with Cephalosporin  use.  . Latex Itching and Rash    Prescriptions Prior to Admission  Medication Sig Dispense Refill Last Dose  . clindamycin (CLEOCIN) 150 MG capsule Take 3 capsules (450 mg total) by mouth 3 (three) times daily. 90 capsule 0 Past Month at Unknown time  . lidocaine (XYLOCAINE) 2 % solution Use as directed 15 mLs in the mouth or throat as needed for mouth pain. 100 mL 0 Past Month at Unknown time  . naproxen (NAPROSYN) 500 MG tablet Take 1 tablet (500 mg total) by mouth 2 (two) times daily. 30 tablet 0 Past Week at Unknown time  . doxycycline (VIBRAMYCIN) 100 MG capsule Take 1 capsule (100 mg total) by mouth 2 (two) times daily. For 7 days (Patient not taking: Reported on 09/26/2015) 14 capsule 0 Completed Course at Unknown time  . metroNIDAZOLE (FLAGYL) 500 MG tablet Take 1 tablet (500 mg total) by mouth 2 (two) times daily. Do not drink alcohol while taking this medicine. (Patient not taking: Reported on 09/26/2015) 14 tablet 0 Completed Course at Unknown time    Review of Systems  Constitutional: Negative for fever.  Gastrointestinal: Positive for abdominal pain, nausea and vomiting. Negative for diarrhea.  Genitourinary: Positive for frequency.  Negative for dysuria, flank pain, hematuria and urgency.   Physical Exam   Blood pressure 112/87, pulse 91, temperature 98.2 F (36.8 C), temperature source Oral, resp. rate 20, last menstrual period 08/09/2015, SpO2 100 %, not currently breastfeeding.  Physical Exam  Constitutional: She is oriented to person, place, and time. She appears well-developed and well-nourished.  HENT:  Head: Normocephalic.  Neck: Normal range of motion.  Cardiovascular: Normal rate.   Respiratory: Effort normal.  GI: Soft. She exhibits no distension and no mass. There is tenderness (mild, diffuse). There is no rebound and no guarding.  Genitourinary:  Genitourinary Comments: External: no lesions Vagina: rugated, parous, thin yellow malodorous discharge Uterus: non  enlarged, anteverted, non tender, no CMT Adnexae: no masses, no tenderness left, no tenderness right   Musculoskeletal: Normal range of motion.  Neurological: She is alert and oriented to person, place, and time.  Skin: Skin is warm and dry.  Psychiatric: She has a normal mood and affect.   Results for orders placed or performed during the hospital encounter of 09/26/15 (from the past 24 hour(s))  Urinalysis, Routine w reflex microscopic (not at [facility])     Status: Abnormal   Collection Time: 09/26/15  7:25 AM  Result Value Ref Range   Color, Urine YELLOW YELLOW   APPearance HAZY (A) CLEAR   Specific Gravity, Urine 1.020 1.005 - 1.030   pH 6.0 5.0 - 8.0   Glucose, UA NEGATIVE NEGATIVE mg/dL   Hgb urine dipstick MODERATE (A) NEGATIVE   Bilirubin Urine NEGATIVE NEGATIVE   Ketones, ur 15 (A) NEGATIVE mg/dL   Protein, ur 782 (A) NEGATIVE mg/dL   Nitrite NEGATIVE NEGATIVE   Leukocytes, UA LARGE (A) NEGATIVE  Urine microscopic-add on     Status: Abnormal   Collection Time: 09/26/15  7:25 AM  Result Value Ref Range   Squamous Epithelial / LPF 0-5 (A) NONE SEEN   WBC, UA TOO NUMEROUS TO COUNT 0 - 5 WBC/hpf   RBC / HPF 6-30 0 - 5 RBC/hpf   Bacteria, UA MANY (A) NONE SEEN   Urine-Other MUCOUS PRESENT   Pregnancy, urine POC     Status: Abnormal   Collection Time: 09/26/15  7:31 AM  Result Value Ref Range   Preg Test, Ur POSITIVE (A) NEGATIVE  CBC     Status: None   Collection Time: 09/26/15  8:17 AM  Result Value Ref Range   WBC 9.9 4.0 - 10.5 K/uL   RBC 4.31 3.87 - 5.11 MIL/uL   Hemoglobin 13.1 12.0 - 15.0 g/dL   HCT 95.6 21.3 - 08.6 %   MCV 85.8 78.0 - 100.0 fL   MCH 30.4 26.0 - 34.0 pg   MCHC 35.4 30.0 - 36.0 g/dL   RDW 57.8 46.9 - 62.9 %   Platelets 302 150 - 400 K/uL  hCG, quantitative, pregnancy     Status: Abnormal   Collection Time: 09/26/15  8:17 AM  Result Value Ref Range   hCG, Beta Chain, Quant, S 103,092 (H) <5 mIU/mL  Wet prep, genital     Status: Abnormal    Collection Time: 09/26/15  8:24 AM  Result Value Ref Range   Yeast Wet Prep HPF POC PRESENT (A) NONE SEEN   Trich, Wet Prep NONE SEEN NONE SEEN   Clue Cells Wet Prep HPF POC NONE SEEN NONE SEEN   WBC, Wet Prep HPF POC MANY (A) NONE SEEN   Sperm NONE SEEN    US Ob Comp Less 14 Wks  Result Date: 09/26/2015 CLINICAL DATA:  Pelvic pain.  Hyperemesis EXAM: OBSTETRIC <14 WK [place] AND TRANSVAGINAL OB US TECHNIQUE: Both transabdominal and transvaginal ultrasound examinations were performed for complete evaluation of the gestation as well as the maternal uterus, adnexal regions, and pelvic cul-de-sac. Transvaginal technique was performed to assess early pregnancy. COMPARISON:  None. FINDINGS: Intrauterine gestational sac: Single Yolk sac:  Visualized Embryo:  Visualized Cardiac Activity: Visualized Heart Rate: 124  bpm MSD:   mm    w     d CRL:  5.3 mm  mm   6 w   1 d                  [place] EDC: 05/21/2015 Subchorionic hemorrhage:  None visualized. Maternal uterus/adnexae: 7.7 x 8.2 x 3.1 cm hypoechoic area posterior to the uterus, felt to represent bowel. No free fluid. No adnexal masses. IMPRESSION: 6 week 1 day intrauterine pregnancy. Fetal heart rate 124 beats per minute. No acute maternal findings. Electronically Signed   By: Charlett Nose M.D.   On: 09/26/2015 11:07   US Ob Transvaginal  Result Date: 09/26/2015 CLINICAL DATA:  Pelvic pain.  Hyperemesis EXAM: OBSTETRIC <14 WK [place] AND TRANSVAGINAL OB US TECHNIQUE: Both transabdominal and transvaginal ultrasound examinations were performed for complete evaluation of the gestation as well as the maternal uterus, adnexal regions, and pelvic cul-de-sac. Transvaginal technique was performed to assess early pregnancy. COMPARISON:  None. FINDINGS: Intrauterine gestational sac: Single Yolk sac:  Visualized Embryo:  Visualized Cardiac Activity: Visualized Heart Rate: 124  bpm MSD:   mm    w     d CRL:  5.3 mm  mm   6 w   1 d                  [place] EDC: 05/21/2015 Subchorionic  hemorrhage:  None visualized. Maternal uterus/adnexae: 7.7 x 8.2 x 3.1 cm hypoechoic area posterior to the uterus, felt to represent bowel. No free fluid. No adnexal masses. IMPRESSION: 6 week 1 day intrauterine pregnancy. Fetal heart rate 124 beats per minute. No acute maternal findings. Electronically Signed   By: Charlett Nose M.D.   On: 09/26/2015 11:07    MAU Course  Procedures Phenergan 25 mg IV Pepcid 20 mg IV LR 1L bolus  MDM Labs ordered and reviewed. Pt tolerating po, nausea improved. Pain likely physiologic to pregnancy or BV related. Stable for discharge home.  Assessment and Plan   1. Yeast vaginitis   2. Pelvic pain in pregnancy, antepartum, first trimester   3. Bacterial vaginosis   4. Nausea/vomiting in pregnancy   5. Dehydration   6. Pelvic pain affecting pregnancy   7. Normal IUP (intrauterine pregnancy) on prenatal ultrasound, first trimester    Discharge home Phenergan 25 mg po q6 hrs prn #30, refill x1 Pepcid 20 mg po daily #30, refill x1 Terazol-7 vaginal cream Flagyl 500 mg po bid x7 days HEG diet [facility] to start [facility] in 1-2 weeks  Donette Larry, CNM 09/26/2015, 8:17 AM

## 2015-09-26 NOTE — Discharge Instructions (Signed)

## 2015-09-27 LAB — GC/CHLAMYDIA PROBE AMP (~~LOC~~) NOT AT ARMC
Chlamydia: NEGATIVE
Neisseria Gonorrhea: NEGATIVE

## 2015-09-28 LAB — CULTURE, OB URINE: Culture: >=100000 — AB

## 2015-09-29 ENCOUNTER — Telehealth: Payer: Self-pay | Admitting: Advanced Practice Midwife

## 2015-09-29 DIAGNOSIS — K117 Disturbances of salivary secretion: Secondary | ICD-10-CM

## 2015-09-29 DIAGNOSIS — O219 Vomiting of pregnancy, unspecified: Secondary | ICD-10-CM

## 2015-09-29 DIAGNOSIS — O2341 Unspecified infection of urinary tract in pregnancy, first trimester: Secondary | ICD-10-CM

## 2015-09-29 MED ORDER — GLYCOPYRROLATE 2 MG PO TABS: 2.0000 mg | ORAL_TABLET | Freq: Three times a day (TID) | ORAL | 3 refills | Status: DC

## 2015-09-29 MED ORDER — FOSFOMYCIN TROMETHAMINE 3 G PO PACK: 3.0000 g | PACK | Freq: Once | ORAL | 0 refills | Status: AC

## 2015-09-29 MED ORDER — PROMETHAZINE HCL 25 MG RE SUPP: 25.0000 mg | Freq: Four times a day (QID) | RECTAL | 2 refills | Status: DC | PRN

## 2015-09-29 NOTE — Telephone Encounter (Signed)
Called to inform patient of positive urine culture. Her consultation with Dr. Macon LargeAnyanwu and pharmacist we will need to treat with fosfomycin due to patient's antibiotic allergies and being in the first trimester pregnancy. Notified patient. Patient states she still vomiting and feels that the nausea and vomiting are aggravated by "having too much spit spit in her throat". Rx sent in for Phenergan suppositories and Robinul. Patient instructed to return to maternity admissions as she is unable to keep anything down for greater than 24 hours. Denies fever, flank pain.

## 2015-10-03 ENCOUNTER — Encounter (HOSPITAL_COMMUNITY): Payer: Self-pay | Admitting: *Deleted

## 2015-10-03 ENCOUNTER — Inpatient Hospital Stay (HOSPITAL_COMMUNITY)
Admission: AD | Admit: 2015-10-03 | Discharge: 2015-10-03 | Disposition: A | Discharge status: Home / Self Care | Payer: Medicaid Other | Source: Ambulatory Visit | Attending: Obstetrics and Gynecology | Admitting: Obstetrics and Gynecology

## 2015-10-03 DIAGNOSIS — Z91040 Latex allergy status: Secondary | ICD-10-CM | POA: Diagnosis not present

## 2015-10-03 DIAGNOSIS — O219 Vomiting of pregnancy, unspecified: Secondary | ICD-10-CM

## 2015-10-03 DIAGNOSIS — A599 Trichomoniasis, unspecified: Secondary | ICD-10-CM | POA: Insufficient documentation

## 2015-10-03 DIAGNOSIS — Z3A01 Less than 8 weeks gestation of pregnancy: Secondary | ICD-10-CM | POA: Diagnosis not present

## 2015-10-03 DIAGNOSIS — A5901 Trichomonal vulvovaginitis: Secondary | ICD-10-CM

## 2015-10-03 DIAGNOSIS — Z880 Allergy status to penicillin: Secondary | ICD-10-CM | POA: Insufficient documentation

## 2015-10-03 DIAGNOSIS — O2341 Unspecified infection of urinary tract in pregnancy, first trimester: Secondary | ICD-10-CM | POA: Diagnosis present

## 2015-10-03 DIAGNOSIS — O98311 Other infections with a predominantly sexual mode of transmission complicating pregnancy, first trimester: Secondary | ICD-10-CM | POA: Diagnosis not present

## 2015-10-03 LAB — URINALYSIS, ROUTINE W REFLEX MICROSCOPIC
Glucose, UA: NEGATIVE mg/dL
Ketones, ur: 15 mg/dL — AB
Nitrite: NEGATIVE
Protein, ur: 30 mg/dL — AB
Specific Gravity, Urine: 1.025 (ref 1.005–1.030)
pH: 6 (ref 5.0–8.0)

## 2015-10-03 LAB — URINE MICROSCOPIC-ADD ON

## 2015-10-03 MED ORDER — FAMOTIDINE IN NACL 20-0.9 MG/50ML-% IV SOLN
20.0000 mg | Freq: Once | INTRAVENOUS | Status: AC
  Administered 2015-10-03: 20 mg via INTRAVENOUS
  Filled 2015-10-03: qty 50

## 2015-10-03 MED ORDER — METOCLOPRAMIDE HCL 5 MG/ML IJ SOLN
10.0000 mg | Freq: Once | INTRAMUSCULAR | Status: AC
  Administered 2015-10-03: 10 mg via INTRAVENOUS
  Filled 2015-10-03: qty 2

## 2015-10-03 MED ORDER — METRONIDAZOLE 500 MG PO TABS
2000.0000 mg | ORAL_TABLET | Freq: Once | ORAL | Status: AC
  Administered 2015-10-03: 2000 mg via ORAL
  Filled 2015-10-03: qty 4

## 2015-10-03 MED ORDER — NITROFURANTOIN MONOHYD MACRO 100 MG PO CAPS: 100.0000 mg | ORAL_CAPSULE | Freq: Two times a day (BID) | ORAL | 0 refills | Status: DC

## 2015-10-03 MED ORDER — NITROFURANTOIN MONOHYD MACRO 100 MG PO CAPS
100.0000 mg | ORAL_CAPSULE | Freq: Once | ORAL | Status: AC
  Administered 2015-10-03: 100 mg via ORAL
  Filled 2015-10-03: qty 1

## 2015-10-03 MED ORDER — DEXTROSE 5 % IN LACTATED RINGERS IV BOLUS
1000.0000 mL | Freq: Once | INTRAVENOUS | Status: AC
  Administered 2015-10-03: 1000 mL via INTRAVENOUS

## 2015-10-03 NOTE — Discharge Instructions (Signed)
Morning Sickness Morning sickness is when you feel sick to your stomach (nauseous) during pregnancy. You may feel sick to your stomach and throw up (vomit). You may feel sick in the morning, but you can feel this way any time of day. Some women feel very sick to their stomach and cannot stop throwing up (hyperemesis gravidarum). HOME CARE  Only take medicines as told by your doctor.  Take multivitamins as told by your doctor. Taking multivitamins before getting pregnant can stop or lessen the harshness of morning sickness.  Eat dry toast or unsalted crackers before getting out of bed.  Eat 5 to 6 small meals a day.  Eat dry and bland foods like rice and baked potatoes.  Do not drink liquids with meals. Drink between meals.  Do not eat greasy, fatty, or spicy foods.  Have someone cook for you if the smell of food causes you to feel sick or throw up.  If you feel sick to your stomach after taking prenatal vitamins, take them at night or with a snack.  Eat protein when you need a snack (nuts, yogurt, cheese).  Eat unsweetened gelatins for dessert.  Wear a bracelet used for sea sickness (acupressure wristband).  Go to a doctor that puts thin needles into certain body points (acupuncture) to improve how you feel.  Do not smoke.  Use a humidifier to keep the air in your house free of odors.  Get lots of fresh air. GET HELP IF:  You need medicine to feel better.  You feel dizzy or lightheaded.  You are losing weight. GET HELP RIGHT AWAY IF:   You feel very sick to your stomach and cannot stop throwing up.  You pass out (faint). MAKE SURE YOU:  Understand these instructions.  Will watch your condition.  Will get help right away if you are not doing well or get worse.   This information is not intended to replace advice given to you by your health care provider. Make sure you discuss any questions you have with your health care provider.   Document Released: 02/07/2004  Document Revised: 01/20/2014 Document Reviewed: 06/16/2012 Elsevier Interactive Patient Education 2016 [employer]. Trichomoniasis Trichomoniasis is an infection caused by an organism called Trichomonas. The infection can affect both women and men. In women, the outer female genitalia and the vagina are affected. In men, the penis is mainly affected, but the prostate and other reproductive organs can also be involved. Trichomoniasis is a sexually transmitted infection (STI) and is most often passed to another person through sexual contact.  RISK FACTORS  Having unprotected sexual intercourse.  Having sexual intercourse with an infected partner. SIGNS AND SYMPTOMS  Symptoms of trichomoniasis in women include:  Abnormal gray-green frothy vaginal discharge.  Itching and irritation of the vagina.  Itching and irritation of the area outside the vagina. Symptoms of trichomoniasis in men include:   Penile discharge with or without pain.  Pain during urination. This results from inflammation of the urethra. DIAGNOSIS  Trichomoniasis may be found during a Pap test or physical exam. Your health care provider may use one of the following methods to help diagnose this infection:  Testing the pH of the vagina with a test tape.  Using a vaginal swab test that checks for the Trichomonas organism. A test is available that provides results within a few minutes.  Examining a urine sample.  Testing vaginal secretions. Your health care provider may test you for other STIs, including HIV. TREATMENT  You may be given medicine to fight the infection. Women should inform their health care provider if they could be or are pregnant. Some medicines used to treat the infection should not be taken during pregnancy.  Your health care provider may recommend over-the-counter medicines or creams to decrease itching or irritation.  Your sexual partner will need to be treated if infected.  Your health care  provider may test you for infection again 3 months after treatment. HOME CARE INSTRUCTIONS   Take medicines only as directed by your health care provider.  Take over-the-counter medicine for itching or irritation as directed by your health care provider.  Do not have sexual intercourse while you have the infection.  Women should not douche or wear tampons while they have the infection.  Discuss your infection with your partner. Your partner may have gotten the infection from you, or you may have gotten it from your partner.  Have your sex partner get examined and treated if necessary.  Practice safe, informed, and protected sex.  See your health care provider for other STI testing. SEEK MEDICAL CARE IF:   You still have symptoms after you finish your medicine.  You develop abdominal pain.  You have pain when you urinate.  You have bleeding after sexual intercourse.  You develop a rash.  Your medicine makes you sick or makes you throw up (vomit). MAKE SURE YOU:  Understand these instructions.  Will watch your condition.  Will get help right away if you are not doing well or get worse.   This information is not intended to replace advice given to you by your health care provider. Make sure you discuss any questions you have with your health care provider.   Document Released: 06/25/2000 Document Revised: 01/20/2014 Document Reviewed: 10/11/2012 Elsevier Interactive Patient Education [employer]2016 Elsevier Inc.

## 2015-10-03 NOTE — MAU Provider Note (Deleted)
S: Rachel Lloyd is a 30 y.o.black female [id number] at 5585w6d here in MAU with 2 days of sharp lower back pain R>L in the setting of untreated UTI diagnosed 7 days ago. She rates it a 9/10 for pain. Pt endorses lower pelvic pain but no dysuria, frequency, or urgency. She has not been febrile. She has had nausea and vomiting for >1 wk which she attributes to pregnancy. She has been eating and drinking very little. Pt was prescribed fosfomycin 4 days ago but was unable to fill the prescription due to cost. Pt states she is urinating less frequency than usual due to poor PO intake, and her last BM was one week ago.  O: Blood pressure 124/80, pulse 101, temperature 98.48F (36.9C), temperature source Oral, resp. rate 18, SpO2 100 %.  Physical Exam Constitutional: She is oriented to person, place, and time. She appears well-developedand well-nourished. No distress.  Lungs: CTAB Cv: RRR, no mrg GI: Soft. Normal appearance. Moderate suprapubic tenderness.There is no rigidity, no reboundand no guarding.  GU: Pelvic area tenderness to palpation. +CVA tenderness  A: --Untreated complicated UTI --Trich infection --Nausea in first trimester  P: --Macrobid 100 mg bid x7 days --Flagyl 2000 mg x1 --Pepcid and Reglan IV for N/V --Unisom and B6 for N/V after discharge --D5LR 1L bolus for rehydration

## 2015-10-03 NOTE — MAU Provider Note (Signed)
History     CSN: [id number]  Arrival date and time: 10/03/15 0901   First Provider Initiated Contact with Patient 10/03/15 [id number]      Chief Complaint  Patient presents with  . Urinary Tract Infection  . Nausea  . Emesis  . Back Pain   HPI   [name] is a 30 y.o. female [id number] @ 3840w6d here in MAU with N/V, and UTI. She was diagnosed with a UTI recently and was unable to fill the antibiotic due to cost. She has anaphylaxis reaction to PCN and was unsure what to do in order to treat the UTI.   She is taking phenergan at home for N/V however feels like it is not helping. She would like something else for the symptoms.   OB History    Gravida Para Term Preterm AB Living   8 3 3  0 3 3   SAB TAB Ectopic Multiple Live Births   0 3 0 0 3      Past Medical History:  Diagnosis Date  . Chlamydia   . Hypertension    During second pregnancy  . Mental disorder 2010   bipolar  . Pneumonia   . Scabies exposure 2012   treated july 2012 & reexposed    Past Surgical History:  Procedure Laterality Date  . INDUCED ABORTION    . MOUTH SURGERY     as a child  . WISDOM TOOTH EXTRACTION      Family History  Problem Relation Age of Onset  . Hypertension Mother   . Cancer Mother   . Fibroids Mother   . ADD / ADHD Brother   . Hypertension Maternal Grandmother   . Anesthesia problems Neg Hx     Social History  Substance Use Topics  . Smoking status: Former Smoker    Types: Cigarettes    Quit date: 04/04/2010  . Smokeless tobacco: Former NeurosurgeonUser  . Alcohol use No     Comment: quit when found out was pregnant    Allergies:  Allergies  Allergen Reactions  . Penicillins Anaphylaxis    Has patient had a PCN reaction causing immediate rash, facial/tongue/throat swelling, SOB or lightheadedness with hypotension: Yes Has patient had a PCN reaction causing severe rash involving mucus membranes or skin necrosis: Yes Has patient had a PCN reaction that required hospitalization  Yes Has patient had a PCN reaction occurring within the last 10 years: No If all of the above answers are "NO", then may proceed with Cephalosporin use.  . Latex Itching and Rash    Prescriptions Prior to Admission  Medication Sig Dispense Refill Last Dose  . famotidine (PEPCID) 20 MG tablet Take 1 tablet (20 mg total) by mouth daily. (Patient not taking: Reported on 10/03/2015) 30 tablet 1 Not Taking at Unknown time  . glycopyrrolate (ROBINUL) 2 MG tablet Take 1 tablet (2 mg total) by mouth 3 (three) times daily. (Patient not taking: Reported on 10/03/2015) 30 tablet 3 Not Taking at Unknown time  . metroNIDAZOLE (FLAGYL) 500 MG tablet Take 1 tablet (500 mg total) by mouth 2 (two) times daily. (Patient not taking: Reported on 10/03/2015) 14 tablet 0 Not Taking at Unknown time  . promethazine (PHENERGAN) 25 MG suppository Place 1 suppository (25 mg total) rectally every 6 (six) hours as needed for nausea or vomiting. (Patient not taking: Reported on 10/03/2015) 12 each 2 Not Taking at Unknown time  . promethazine (PHENERGAN) 25 MG tablet Take 1 tablet (25 mg total) by  mouth every 6 (six) hours as needed for nausea or vomiting. (Patient not taking: Reported on 10/03/2015) 30 tablet 1 Not Taking at Unknown time  . terconazole (TERAZOL 7) 0.4 % vaginal cream Place 1 applicator vaginally at bedtime. (Patient not taking: Reported on 10/03/2015) 45 g 0 Not Taking at Unknown time   Results for orders placed or performed during the hospital encounter of 10/03/15 (from the past 24 hour(s))  Urinalysis, Routine w reflex microscopic (not at [facility])     Status: Abnormal   Collection Time: 10/03/15  9:20 AM  Result Value Ref Range   Color, Urine YELLOW YELLOW   APPearance CLEAR CLEAR   Specific Gravity, Urine 1.025 1.005 - 1.030   pH 6.0 5.0 - 8.0   Glucose, UA NEGATIVE NEGATIVE mg/dL   Hgb urine dipstick MODERATE (A) NEGATIVE   Bilirubin Urine SMALL (A) NEGATIVE   Ketones, ur 15 (A) NEGATIVE mg/dL   Protein,  ur 30 (A) NEGATIVE mg/dL   Nitrite NEGATIVE NEGATIVE   Leukocytes, UA SMALL (A) NEGATIVE  Urine microscopic-add on     Status: Abnormal   Collection Time: 10/03/15  9:20 AM  Result Value Ref Range   Squamous Epithelial / LPF 6-30 (A) NONE SEEN   WBC, UA 6-30 0 - 5 WBC/hpf   RBC / HPF 0-5 0 - 5 RBC/hpf   Bacteria, UA FEW (A) NONE SEEN   Urine-Other TRICHOMONAS PRESENT    Review of Systems  Constitutional: Negative for chills and fever.  Gastrointestinal: Negative for nausea and vomiting.  Genitourinary: Negative for flank pain.  Musculoskeletal: Positive for back pain (Right side worse than left side. ).   Physical Exam   Blood pressure 124/80, pulse 101, temperature 98.5 F (36.9 C), temperature source Oral, resp. rate 18, last menstrual period 08/09/2015.  Physical Exam  Constitutional: She is oriented to person, place, and time. She appears well-developed and well-nourished. No distress.  HENT:  Head: Normocephalic.  Eyes: Pupils are equal, round, and reactive to light.  Respiratory: Effort normal.  GI: Soft. She exhibits no distension. There is no tenderness. There is no rebound and no CVA tenderness.  Musculoskeletal: Normal range of motion.  Neurological: She is alert and oriented to person, place, and time.  Skin: Skin is warm. She is not diaphoretic.  Psychiatric: Her behavior is normal.   MAU Course  Procedures  None  MDM  Flagyl 2 grams given  LR bolus X1 Reglan 10 mg IV Pepcid IV  X 1 Patient tolerating PO fluids   Assessment and Plan   A:  1. UTI (urinary tract infection) during pregnancy, first trimester   2. Trichomonas infection   3. Nausea and vomiting in pregnancy     P:  Discharge home in stable condition B6 and unisom recommended for N/V Partner needs treatment, no sex X 7 days Condoms always  Return to MAU if symptoms worsen    Duane Lope, NP 10/03/2015 1:44 PM

## 2015-10-03 NOTE — MAU Note (Signed)
Pt given rxs last week for UTI,BV and N&V but she has not filled the rxs because she  "can't afford the medicine"; She did not try the nausea medicine vaginally or rectally because "her stuff is all itchy and irritated;

## 2015-10-03 NOTE — MAU Note (Signed)
C/o back pain for past 2 days; still having  N&V; diagnosed with UTI last week but hasn't had a chance to get rx filled;

## 2015-10-15 ENCOUNTER — Encounter (HOSPITAL_COMMUNITY): Payer: Self-pay | Admitting: *Deleted

## 2015-10-18 ENCOUNTER — Inpatient Hospital Stay (HOSPITAL_COMMUNITY)
Admission: AD | Admit: 2015-10-18 | Discharge: 2015-10-18 | Disposition: A | Discharge status: Home / Self Care | Payer: Medicaid Other | Source: Ambulatory Visit | Attending: Family Medicine | Admitting: Family Medicine

## 2015-10-18 ENCOUNTER — Encounter (HOSPITAL_COMMUNITY): Payer: Self-pay | Admitting: Student

## 2015-10-18 ENCOUNTER — Inpatient Hospital Stay (HOSPITAL_COMMUNITY): Payer: Medicaid Other

## 2015-10-18 DIAGNOSIS — O418X10 Other specified disorders of amniotic fluid and membranes, first trimester, not applicable or unspecified: Secondary | ICD-10-CM

## 2015-10-18 DIAGNOSIS — B373 Candidiasis of vulva and vagina: Secondary | ICD-10-CM | POA: Diagnosis not present

## 2015-10-18 DIAGNOSIS — Z880 Allergy status to penicillin: Secondary | ICD-10-CM | POA: Diagnosis not present

## 2015-10-18 DIAGNOSIS — O2341 Unspecified infection of urinary tract in pregnancy, first trimester: Secondary | ICD-10-CM

## 2015-10-18 DIAGNOSIS — O209 Hemorrhage in early pregnancy, unspecified: Secondary | ICD-10-CM | POA: Diagnosis present

## 2015-10-18 DIAGNOSIS — Z3A10 10 weeks gestation of pregnancy: Secondary | ICD-10-CM | POA: Diagnosis not present

## 2015-10-18 DIAGNOSIS — O468X1 Other antepartum hemorrhage, first trimester: Secondary | ICD-10-CM

## 2015-10-18 DIAGNOSIS — B3731 Acute candidiasis of vulva and vagina: Secondary | ICD-10-CM

## 2015-10-18 DIAGNOSIS — O98811 Other maternal infectious and parasitic diseases complicating pregnancy, first trimester: Secondary | ICD-10-CM | POA: Insufficient documentation

## 2015-10-18 DIAGNOSIS — Z87891 Personal history of nicotine dependence: Secondary | ICD-10-CM | POA: Insufficient documentation

## 2015-10-18 HISTORY — DX: Trichomoniasis, unspecified: A59.9

## 2015-10-18 LAB — WET PREP, GENITAL
Clue Cells Wet Prep HPF POC: NONE SEEN
Sperm: NONE SEEN
Trich, Wet Prep: NONE SEEN
Yeast Wet Prep HPF POC: NONE SEEN

## 2015-10-18 LAB — URINE MICROSCOPIC-ADD ON

## 2015-10-18 LAB — URINALYSIS, ROUTINE W REFLEX MICROSCOPIC
Bilirubin Urine: NEGATIVE
Glucose, UA: NEGATIVE mg/dL
Ketones, ur: NEGATIVE mg/dL
Nitrite: POSITIVE — AB
Protein, ur: NEGATIVE mg/dL
Specific Gravity, Urine: 1.025 (ref 1.005–1.030)
pH: 6 (ref 5.0–8.0)

## 2015-10-18 MED ORDER — FLUCONAZOLE 150 MG PO TABS: 150.0000 mg | ORAL_TABLET | Freq: Every day | ORAL | 1 refills | Status: DC

## 2015-10-18 NOTE — Discharge Instructions (Signed)
Pregnancy and Urinary Tract Infection °A urinary tract infection (UTI) is a bacterial infection of the urinary tract. Infection of the urinary tract can include the ureters, kidneys (pyelonephritis), bladder (cystitis), and urethra (urethritis). All pregnant women should be screened for bacteria in the urinary tract. Identifying and treating a UTI will decrease the risk of preterm labor and developing more serious infections in both the mother and baby. °CAUSES °Bacteria germs cause almost all UTIs.  °RISK FACTORS °Many factors can increase your chances of getting a UTI during pregnancy. These include: °· Having a short urethra. °· Poor toilet and hygiene habits. °· Sexual intercourse. °· Blockage of urine along the urinary tract. °· Problems with the pelvic muscles or nerves. °· Diabetes. °· Obesity. °· Bladder problems after having several children. °· Previous history of UTI. °SIGNS AND SYMPTOMS  °· Pain, burning, or a stinging feeling when urinating. °· Suddenly feeling the need to urinate right away (urgency). °· Loss of bladder control (urinary incontinence). °· Frequent urination, more than is common with pregnancy. °· Lower abdominal or back discomfort. °· Cloudy urine. °· Blood in the urine (hematuria). °· Fever.  °When the kidneys are infected, the symptoms may be: °· Back pain. °· Flank pain on the right side more so than the left. °· Fever. °· Chills. °· Nausea. °· Vomiting. °DIAGNOSIS  °A urinary tract infection is usually diagnosed through urine tests. Additional tests and procedures are sometimes done. These may include: °· Ultrasound exam of the kidneys, ureters, bladder, and urethra. °· Looking in the bladder with a lighted tube (cystoscopy). °TREATMENT °Typically, UTIs can be treated with antibiotic medicines.  °HOME CARE INSTRUCTIONS  °· Only take over-the-counter or prescription medicines as directed by your health care provider. If you were prescribed antibiotics, take them as directed. Finish  them even if you start to feel better. °· Drink enough fluids to keep your urine clear or pale yellow. °· Do not have sexual intercourse until the infection is gone and your health care provider says it is okay. °· Make sure you are tested for UTIs throughout your pregnancy. These infections often come back.  °Preventing a UTI in the Future °· Practice good toilet habits. Always wipe from front to back. Use the tissue only once. °· Do not hold your urine. Empty your bladder as soon as possible when the urge comes. °· Do not douche or use deodorant sprays. °· Wash with soap and warm water around the genital area and the anus. °· Empty your bladder before and after sexual intercourse. °· Wear underwear with a cotton crotch. °· Avoid caffeine and carbonated drinks. They can irritate the bladder. °· Drink cranberry juice or take cranberry pills. This may decrease the risk of getting a UTI. °· Do not drink alcohol. °· Keep all your appointments and tests as scheduled.  °SEEK MEDICAL CARE IF:  °· Your symptoms get worse. °· You are still having fevers 2 or more days after treatment begins. °· You have a rash. °· You feel that you are having problems with medicines prescribed. °· You have abnormal vaginal discharge. °SEEK IMMEDIATE MEDICAL CARE IF:  °· You have back or flank pain. °· You have chills. °· You have blood in your urine. °· You have nausea and vomiting. °· You have contractions of your uterus. °· You have a gush of fluid from the vagina. °MAKE SURE YOU: °· Understand these instructions.   °· Will watch your condition.   °· Will get help right away if you are not doing   well or get worse.    This information is not intended to replace advice given to you by your health care provider. Make sure you discuss any questions you have with your health care provider.   Document Released: 04/26/2010 Document Revised: 10/20/2012 Document Reviewed: 07/29/2012 Elsevier Interactive Patient Education 2016 Elsevier  [employer]. Monilial Vaginitis Vaginitis in a soreness, swelling and redness (inflammation) of the vagina and vulva. Monilial vaginitis is not a sexually transmitted infection. CAUSES  Yeast vaginitis is caused by yeast (candida) that is normally found in your vagina. With a yeast infection, the candida has overgrown in number to a point that upsets the chemical balance. SYMPTOMS   White, thick vaginal discharge.  Swelling, itching, redness and irritation of the vagina and possibly the lips of the vagina (vulva).  Burning or painful urination.  Painful intercourse. DIAGNOSIS  Things that may contribute to monilial vaginitis are:  Postmenopausal and virginal states.  Pregnancy.  Infections.  Being tired, sick or stressed, especially if you had monilial vaginitis in the past.  Diabetes. Good control will help lower the chance.  Birth control pills.  Tight fitting garments.  Using bubble bath, feminine sprays, douches or deodorant tampons.  Taking certain medications that kill germs (antibiotics).  Sporadic recurrence can occur if you become ill. TREATMENT  Your caregiver will give you medication.  There are several kinds of anti monilial vaginal creams and suppositories specific for monilial vaginitis. For recurrent yeast infections, use a suppository or cream in the vagina 2 times a week, or as directed.  Anti-monilial or steroid cream for the itching or irritation of the vulva may also be used. Get your caregiver's permission.  Painting the vagina with methylene blue solution may help if the monilial cream does not work.  Eating yogurt may help prevent monilial vaginitis. HOME CARE INSTRUCTIONS   Finish all medication as prescribed.  Do not have sex until treatment is completed or after your caregiver tells you it is okay.  Take warm sitz baths.  Do not douche.  Do not use tampons, especially scented ones.  Wear cotton underwear.  Avoid tight pants and panty  hose.  Tell your sexual partner that you have a yeast infection. They should go to their caregiver if they have symptoms such as mild rash or itching.  Your sexual partner should be treated as well if your infection is difficult to eliminate.  Practice safer sex. Use condoms.  Some vaginal medications cause latex condoms to fail. Vaginal medications that harm condoms are:  Cleocin cream.  Butoconazole (Femstat).  Terconazole (Terazol) vaginal suppository.  Miconazole (Monistat) (may be purchased over the counter). SEEK MEDICAL CARE IF:   You have a temperature by mouth above 102 F (38.9 C).  The infection is getting worse after 2 days of treatment.  The infection is not getting better after 3 days of treatment.  You develop blisters in or around your vagina.  You develop vaginal bleeding, and it is not your menstrual period.  You have pain when you urinate.  You develop intestinal problems.  You have pain with sexual intercourse.   This information is not intended to replace advice given to you by your health care provider. Make sure you discuss any questions you have with your health care provider.   Document Released: 10/09/2004 Document Revised: 03/24/2011 Document Reviewed: 07/03/2014 Elsevier Interactive Patient Education 2016 Else[employer] Inc. Pelvic Rest Pelvic rest is sometimes recommended for women when:   The placenta is partially or completely covering  the opening of the cervix (placenta previa).  There is bleeding between the uterine wall and the amniotic sac in the first trimester (subchorionic hemorrhage).  The cervix begins to open without labor starting (incompetent cervix, cervical insufficiency).  The labor is too early (preterm labor). HOME CARE INSTRUCTIONS  Do not have sexual intercourse, stimulation, or an orgasm.  Do not use tampons, douche, or put anything in the vagina.  Do not lift anything over 10 pounds (4.5 kg).  Avoid strenuous  activity or straining your pelvic muscles. SEEK MEDICAL CARE IF:  You have any vaginal bleeding during pregnancy. Treat this as a potential emergency.  You have cramping pain felt low in the stomach (stronger than menstrual cramps).  You notice vaginal discharge (watery, mucus, or bloody).  You have a low, dull backache.  There are regular contractions or uterine tightening. SEEK IMMEDIATE MEDICAL CARE IF: You have vaginal bleeding and have placenta previa.    This information is not intended to replace advice given to you by your health care provider. Make sure you discuss any questions you have with your health care provider.   Document Released: 04/26/2010 Document Revised: 03/24/2011 Document Reviewed: 07/03/2014 Elsevier Interactive Patient Education 2016 [employer] Inc. Subchorionic Hematoma A subchorionic hematoma is a gathering of blood between the outer wall of the placenta and the inner wall of the womb (uterus). The placenta is the organ that connects the fetus to the wall of the uterus. The placenta performs the feeding, breathing (oxygen to the fetus), and waste removal (excretory work) of the fetus.  Subchorionic hematoma is the most common abnormality found on a result from ultrasonography done during the first trimester or early second trimester of pregnancy. If there has been little or no vaginal bleeding, early small hematomas usually shrink on their own and do not affect your baby or pregnancy. The blood is gradually absorbed over 1-2 weeks. When bleeding starts later in pregnancy or the hematoma is larger or occurs in an older pregnant woman, the outcome may not be as good. Larger hematomas may get bigger, which increases the chances for miscarriage. Subchorionic hematoma also increases the risk of premature detachment of the placenta from the uterus, preterm (premature) labor, and stillbirth. HOME CARE INSTRUCTIONS  Stay on bed rest if your health care provider recommends  this. Although bed rest will not prevent more bleeding or prevent a miscarriage, your health care provider may recommend bed rest until you are advised otherwise.  Avoid heavy lifting (more than 10 lb [4.5 kg]), exercise, sexual intercourse, or douching as directed by your health care provider.  Keep track of the number of pads you use each day and how soaked (saturated) they are. Write down this information.  Do not use tampons.  Keep all follow-up appointments as directed by your health care provider. Your health care provider may ask you to have follow-up blood tests or ultrasound tests or both. SEEK IMMEDIATE MEDICAL CARE IF:  You have severe cramps in your stomach, back, abdomen, or pelvis.  You have a fever.  You pass large clots or tissue. Save any tissue for your health care provider to look at.  Your bleeding increases or you become lightheaded, feel weak, or have fainting episodes.   This information is not intended to replace advice given to you by your health care provider. Make sure you discuss any questions you have with your health care provider.   Document Released: 04/16/2006 Document Revised: 01/20/2014 Document Reviewed: 07/29/2012 Elsevier Interactive Patient  Education ©2016 Elsevier Inc. ° °

## 2015-10-18 NOTE — MAU Provider Note (Signed)
History     CSN: [id number]  Arrival date and time: 10/18/15 1453   First Provider Initiated Contact with Patient 10/18/15 1530      Chief Complaint  Patient presents with  . Vaginal Bleeding  . Abdominal Pain   HPI Rachel Lloyd is a 30 y.o. [id number] at [redacted]w[redacted]d who presents with vaginal bleeding and abdominal cramping. Woke up this morning with vaginal bleeding that soaked through her underwear and onto her bed. States bleeding has decreased since then and last time she went to the bathroom in MAU didn't see blood. Also reports abdominal cramping since last MAU visit. Was diagnosed with UTI but didn't pick up antibiotics b/c she didn't have insurance yet. Rates pain 8/10. Has not treated. Nothing makes better or worse.  Denies n/v/d, flank pain, fever/chills, recent intercourse.  OB History    Gravida Para Term Preterm AB Living   8 3 3  0 4 3   SAB TAB Ectopic Multiple Live Births   1 3 0 0 3      Past Medical History:  Diagnosis Date  . Chlamydia   . Hypertension    During second pregnancy  . Mental disorder 2010   bipolar  . Pneumonia   . Trichimoniasis     Past Surgical History:  Procedure Laterality Date  . INDUCED ABORTION    . MOUTH SURGERY     as a child  . WISDOM TOOTH EXTRACTION      Family History  Problem Relation Age of Onset  . Hypertension Mother   . Cancer Mother   . Fibroids Mother   . ADD / ADHD Brother   . Hypertension Maternal Grandmother   . Anesthesia problems Neg Hx     Social History  Substance Use Topics  . Smoking status: Former Smoker    Types: Cigarettes    Quit date: 04/04/2010  . Smokeless tobacco: Former Neurosurgeon  . Alcohol use No     Comment: quit when found out was pregnant    Allergies:  Allergies  Allergen Reactions  . Penicillins Anaphylaxis    Has patient had a PCN reaction causing immediate rash, facial/tongue/throat swelling, SOB or lightheadedness with hypotension: Yes Has patient had a PCN reaction causing severe  rash involving mucus membranes or skin necrosis: Yes Has patient had a PCN reaction that required hospitalization Yes Has patient had a PCN reaction occurring within the last 10 years: No If all of the above answers are "NO", then may proceed with Cephalosporin use.  . Latex Itching and Rash    Prescriptions Prior to Admission  Medication Sig Dispense Refill Last Dose  . famotidine (PEPCID) 20 MG tablet Take 1 tablet (20 mg total) by mouth daily. (Patient not taking: Reported on 10/03/2015) 30 tablet 1 Not Taking at Unknown time  . glycopyrrolate (ROBINUL) 2 MG tablet Take 1 tablet (2 mg total) by mouth 3 (three) times daily. (Patient not taking: Reported on 10/03/2015) 30 tablet 3 Not Taking at Unknown time  . nitrofurantoin, macrocrystal-monohydrate, (MACROBID) 100 MG capsule Take 1 capsule (100 mg total) by mouth 2 (two) times daily. 14 capsule 0   . promethazine (PHENERGAN) 25 MG tablet Take 1 tablet (25 mg total) by mouth every 6 (six) hours as needed for nausea or vomiting. (Patient not taking: Reported on 10/03/2015) 30 tablet 1 Not Taking at Unknown time    Review of Systems  Constitutional: Negative for chills and fever.  Gastrointestinal: Positive for abdominal pain and constipation. Negative for  diarrhea, nausea and vomiting.  Genitourinary: Positive for dysuria. Negative for flank pain, frequency and hematuria.       + vaginal irritation + vaginal bleeding   Physical Exam   Blood pressure 114/78, pulse 96, temperature 98.4 F (36.9 C), temperature source Oral, resp. rate 18, last menstrual period 08/09/2015, SpO2 99 %.  Physical Exam  Nursing note and vitals reviewed. Constitutional: She is oriented to person, place, and time. She appears well-developed and well-nourished. No distress.  HENT:  Head: Normocephalic and atraumatic.  Eyes: Conjunctivae are normal. Right eye exhibits no discharge. Left eye exhibits no discharge. No scleral icterus.  Neck: Normal range of motion.   Respiratory: Effort normal. No respiratory distress.  GI: Soft. She exhibits no distension. There is no tenderness. There is no rebound and no guarding.  Genitourinary: There is rash and tenderness on the right labia. There is rash and tenderness on the left labia. Cervix exhibits no motion tenderness. There is erythema and bleeding (small amount of dark red blood) in the vagina.  Genitourinary Comments: Cervix closed  Neurological: She is alert and oriented to person, place, and time.  Skin: Skin is warm and dry. She is not diaphoretic.  Psychiatric: She has a normal mood and affect. Her behavior is normal. Judgment and thought content normal.    MAU Course  Procedures Results for orders placed or performed during the hospital encounter of 10/18/15 (from the past 24 hour(s))  Urinalysis, Routine w reflex microscopic (not at [facility])     Status: Abnormal   Collection Time: 10/18/15  2:55 PM  Result Value Ref Range   Color, Urine YELLOW YELLOW   APPearance HAZY (A) CLEAR   Specific Gravity, Urine 1.025 1.005 - 1.030   pH 6.0 5.0 - 8.0   Glucose, UA NEGATIVE NEGATIVE mg/dL   Hgb urine dipstick SMALL (A) NEGATIVE   Bilirubin Urine NEGATIVE NEGATIVE   Ketones, ur NEGATIVE NEGATIVE mg/dL   Protein, ur NEGATIVE NEGATIVE mg/dL   Nitrite POSITIVE (A) NEGATIVE   Leukocytes, UA MODERATE (A) NEGATIVE  Urine microscopic-add on     Status: Abnormal   Collection Time: 10/18/15  2:55 PM  Result Value Ref Range   Squamous Epithelial / LPF 0-5 (A) NONE SEEN   WBC, UA 6-30 0 - 5 WBC/hpf   RBC / HPF 0-5 0 - 5 RBC/hpf   Bacteria, UA MANY (A) NONE SEEN  Wet prep, genital     Status: Abnormal   Collection Time: 10/18/15  3:45 PM  Result Value Ref Range   Yeast Wet Prep HPF POC NONE SEEN NONE SEEN   Trich, Wet Prep NONE SEEN NONE SEEN   Clue Cells Wet Prep HPF POC NONE SEEN NONE SEEN   WBC, Wet Prep HPF POC FEW (A) NONE SEEN   Sperm NONE SEEN    [place] Ob Comp Less 14 Wks  Result Date:  10/18/2015 CLINICAL DATA:  Heavy vaginal bleeding this morning. By LMP patient is 10 weeks 0 days. EDC by LMP is 05/15/2016. EXAM: OBSTETRIC <14 WK ULTRASOUND TECHNIQUE: Transabdominal ultrasound was performed for evaluation of the gestation as well as the maternal uterus and adnexal regions. COMPARISON:  09/26/2015 FINDINGS: Intrauterine gestational sac: Present Yolk sac:  Present Embryo:  Present Cardiac Activity: Present Heart Rate: 171 bpm CRL:   31  mm   10 w 0 d                  US EDC: 05/15/2016 Subchorionic hemorrhage:  Small  subchorionic hemorrhage is present. Maternal uterus/adnexae: Ovaries are normal in appearance. No free pelvic fluid. IMPRESSION: 1. Single living intrauterine embryo. 2. Size and dates correlate well. Today's exam confirms clinical EDC of 05/15/2016. 3. Small subchorionic hemorrhage noted. Electronically Signed   By: Norva Pavlov M.D.   On: 10/18/2015 16:41    MDM A positive Cervix closed, scant blood on exam U/a + nitrites, urine culture sent Ultrasound shows SIUP with cardiac activity and small [facility] Assessment and Plan  A: 1. Urinary tract infection in mother during first trimester of pregnancy   2. Vaginal bleeding in pregnancy, first trimester   3. Subchorionic hematoma in first trimester, single or unspecified fetus   4. Vaginal yeast infection    P: Discharge home Pick up macrobid and take as prescribed Rx diflucan Pelvic rest Discussed reasons to return to MAU Start prenatal care  Judeth Horn 10/18/2015, 3:30 PM

## 2015-10-18 NOTE — MAU Note (Addendum)
Pt states she woke up with blood soaked panties and on bed.  Pt thinks it has stopped now but has been having a lot of cramping.  Pt hasn't taken anything for the pain.  Pt was prescribed Macrobid on 10-03-15 but hasn't had it filled due to not having her medicaid active.

## 2015-10-20 LAB — CULTURE, OB URINE: Culture: >=100000 — AB

## 2015-10-21 ENCOUNTER — Other Ambulatory Visit (HOSPITAL_COMMUNITY): Payer: Self-pay | Admitting: Obstetrics and Gynecology

## 2015-11-08 ENCOUNTER — Ambulatory Visit: Payer: Self-pay | Admitting: Internal Medicine

## 2015-11-08 ENCOUNTER — Encounter (HOSPITAL_COMMUNITY): Payer: Self-pay

## 2015-11-08 ENCOUNTER — Encounter: Payer: Self-pay | Admitting: Internal Medicine

## 2015-11-08 ENCOUNTER — Ambulatory Visit (INDEPENDENT_AMBULATORY_CARE_PROVIDER_SITE_OTHER): Payer: Medicaid Other | Admitting: Internal Medicine

## 2015-11-08 ENCOUNTER — Inpatient Hospital Stay (HOSPITAL_COMMUNITY)
Admission: AD | Admit: 2015-11-08 | Discharge: 2015-11-08 | Disposition: A | Discharge status: Home / Self Care | Payer: Medicaid Other | Source: Ambulatory Visit | Attending: Obstetrics and Gynecology | Admitting: Obstetrics and Gynecology

## 2015-11-08 ENCOUNTER — Other Ambulatory Visit (HOSPITAL_COMMUNITY)
Admission: RE | Admit: 2015-11-08 | Discharge: 2015-11-08 | Disposition: A | Discharge status: Home / Self Care | Payer: Medicaid Other | Source: Ambulatory Visit | Attending: Family Medicine | Admitting: Family Medicine

## 2015-11-08 VITALS — BP 98/76 | HR 102 | Temp 98.4°F | Ht 62.0 in | Wt 125.2 lb

## 2015-11-08 DIAGNOSIS — O2341 Unspecified infection of urinary tract in pregnancy, first trimester: Secondary | ICD-10-CM | POA: Diagnosis not present

## 2015-11-08 DIAGNOSIS — R51 Headache: Secondary | ICD-10-CM

## 2015-11-08 DIAGNOSIS — Z87891 Personal history of nicotine dependence: Secondary | ICD-10-CM | POA: Insufficient documentation

## 2015-11-08 DIAGNOSIS — N898 Other specified noninflammatory disorders of vagina: Secondary | ICD-10-CM | POA: Diagnosis not present

## 2015-11-08 DIAGNOSIS — O2301 Infections of kidney in pregnancy, first trimester: Secondary | ICD-10-CM | POA: Diagnosis present

## 2015-11-08 DIAGNOSIS — B3731 Acute candidiasis of vulva and vagina: Secondary | ICD-10-CM

## 2015-11-08 DIAGNOSIS — A599 Trichomoniasis, unspecified: Secondary | ICD-10-CM | POA: Diagnosis not present

## 2015-11-08 DIAGNOSIS — Z113 Encounter for screening for infections with a predominantly sexual mode of transmission: Secondary | ICD-10-CM | POA: Diagnosis not present

## 2015-11-08 DIAGNOSIS — N3000 Acute cystitis without hematuria: Secondary | ICD-10-CM

## 2015-11-08 DIAGNOSIS — Z880 Allergy status to penicillin: Secondary | ICD-10-CM | POA: Diagnosis not present

## 2015-11-08 DIAGNOSIS — R350 Frequency of micturition: Secondary | ICD-10-CM

## 2015-11-08 DIAGNOSIS — R1030 Lower abdominal pain, unspecified: Secondary | ICD-10-CM | POA: Diagnosis present

## 2015-11-08 DIAGNOSIS — Z3A13 13 weeks gestation of pregnancy: Secondary | ICD-10-CM | POA: Diagnosis not present

## 2015-11-08 DIAGNOSIS — B373 Candidiasis of vulva and vagina: Secondary | ICD-10-CM | POA: Diagnosis not present

## 2015-11-08 DIAGNOSIS — R519 Headache, unspecified: Secondary | ICD-10-CM

## 2015-11-08 LAB — POCT WET PREP (WET MOUNT): Clue Cells Wet Prep Whiff POC: POSITIVE

## 2015-11-08 LAB — POCT URINALYSIS DIPSTICK
Bilirubin, UA: NEGATIVE
Glucose, UA: NEGATIVE
Ketones, UA: NEGATIVE
Nitrite, UA: POSITIVE
Protein, UA: NEGATIVE
Spec Grav, UA: 1.025
Urobilinogen, UA: 0.2
pH, UA: 6

## 2015-11-08 LAB — POCT UA - MICROSCOPIC ONLY

## 2015-11-08 LAB — CBC WITH DIFFERENTIAL/PLATELET
Basophils Absolute: 0 10*3/uL (ref 0.0–0.1)
Basophils Relative: 0 %
Eosinophils Absolute: 0.1 10*3/uL (ref 0.0–0.7)
Eosinophils Relative: 2 %
HCT: 31 % — ABNORMAL LOW (ref 36.0–46.0)
Hemoglobin: 11 g/dL — ABNORMAL LOW (ref 12.0–15.0)
Lymphocytes Relative: 22 %
Lymphs Abs: 1.7 10*3/uL (ref 0.7–4.0)
MCH: 30.3 pg (ref 26.0–34.0)
MCHC: 35.5 g/dL (ref 30.0–36.0)
MCV: 85.4 fL (ref 78.0–100.0)
Monocytes Absolute: 0.4 10*3/uL (ref 0.1–1.0)
Monocytes Relative: 5 %
Neutro Abs: 5.4 10*3/uL (ref 1.7–7.7)
Neutrophils Relative %: 71 %
Platelets: 258 10*3/uL (ref 150–400)
RBC: 3.63 MIL/uL — ABNORMAL LOW (ref 3.87–5.11)
RDW: 12.2 % (ref 11.5–15.5)
WBC: 7.6 10*3/uL (ref 4.0–10.5)

## 2015-11-08 MED ORDER — METRONIDAZOLE 500 MG PO TABS
2000.0000 mg | ORAL_TABLET | Freq: Once | ORAL | Status: AC
  Administered 2015-11-08: 2000 mg via ORAL

## 2015-11-08 MED ORDER — NITROFURANTOIN MONOHYD MACRO 100 MG PO CAPS: 100.0000 mg | ORAL_CAPSULE | Freq: Two times a day (BID) | ORAL | 0 refills | Status: DC

## 2015-11-08 MED ORDER — DEXTROSE 5 % IV SOLN
7.0000 mg/kg | Freq: Once | INTRAVENOUS | Status: AC
  Administered 2015-11-08: 400 mg via INTRAVENOUS
  Filled 2015-11-08: qty 10

## 2015-11-08 MED ORDER — TERCONAZOLE 0.4 % VA CREA: 1.0000 | TOPICAL_CREAM | Freq: Every day | VAGINAL | 0 refills | Status: AC

## 2015-11-08 MED ORDER — SODIUM CHLORIDE 0.9 % IV SOLN
INTRAVENOUS | Status: DC
  Administered 2015-11-08: 21:00:00 via INTRAVENOUS

## 2015-11-08 NOTE — MAU Note (Signed)
Pt sent from doctors office concerned for a kidney infection. Pt c/ol left back that woke her up from her sleep. Was diagnosed with a UTI about a month ago and never got antibiotic because she did not have insurance. Pt  Dx with trich today. Pt thinks she has a yeast infection as well.

## 2015-11-08 NOTE — MAU Provider Note (Signed)
Chief Complaint: Back Pain   First Provider Initiated Contact with Patient 11/08/15 1952        SUBJECTIVE HPI: Rachel Lloyd is a 30 y.o. [id number] at [redacted]w[redacted]d by LMP who presents to maternity admissions reporting lower abdominal pain and bilateral flank pain.  Patient points to mid to lower back, not really where CVA is.  States it feels the same as when she had pyelonephritis before. . She denies vaginal bleeding, vaginal itching/burning, urinary symptoms, h/a, dizziness, vomiting, or fever/chills.  Does have some nausea.  Had Rx Phenergan but never got it filled.  Back Pain  This is a recurrent problem. The current episode started in the past 7 days. The problem occurs constantly. The problem is unchanged. Pain location: bilateral low and mid back. The quality of the pain is described as aching. The pain does not radiate. The pain is moderate. The pain is the same all the time. Stiffness is present all day. Associated symptoms include abdominal pain, dysuria and pelvic pain. Pertinent negatives include no fever or leg pain. She has tried nothing for the symptoms.   Note from Family Medicine today: UTI with concern for pyelonephritis:  UA with LE and nitrites. Patient also has some CVA tenderness. Is afebrile. With history of pyelonephritis and patient report of similar symptoms current and since she is pregnant, will sent to MAU for admission and for IV antibiotics. Called MAU to inform. Urine Culture obtained  Vaginal Discharge: Trichomoniasis - treated in clinic with Flagyl 2g x 1  - advised that partner gets treated - Gc/Chlamydia sent   Vulvovaginal candidiasis:  - Terconazole 0.4% vaginal cream x 7 days   Headaches: possibly migrainous in etiology although patient does not have history of migraines. Neuroexam is normal. No signs of meningitis.  - recommended staying hydrated - Tylenol PRN - return precautions discussed.    Palma Holter, MD PGY 2 [facility]  Past Medical History:  Diagnosis Date  . Chlamydia   . Hypertension    During second pregnancy  . Mental disorder 2010   bipolar  . Pneumonia   . Trichimoniasis    Past Surgical History:  Procedure Laterality Date  . INDUCED ABORTION    . MOUTH SURGERY     as a child  . WISDOM TOOTH EXTRACTION     Social History   Social History  . Marital status: Single    Spouse name: N/A  . Number of children: N/A  . Years of education: N/A   Occupational History  . Not on file.   Social History Main Topics  . Smoking status: Former Smoker    Types: Cigarettes    Quit date: 04/04/2010  . Smokeless tobacco: Former Neurosurgeon  . Alcohol use No     Comment: quit when found out was pregnant  . Drug use:     Types: Marijuana  . Sexual activity: Yes    Birth control/ protection: None   Other Topics Concern  . Not on file   Social History Narrative  . No narrative on file   No current facility-administered medications on file prior to encounter.    Current Outpatient Prescriptions on File Prior to Encounter  Medication Sig Dispense Refill  . acetaminophen (TYLENOL) 500 MG tablet Take 500 mg by mouth every 6 (six) hours as needed for mild pain.    . famotidine (PEPCID) 20 MG tablet Take 1 tablet (20 mg total) by mouth daily. (Patient not taking: Reported on  10/18/2015) 30 tablet 1  . glycopyrrolate (ROBINUL) 2 MG tablet Take 1 tablet (2 mg total) by mouth 3 (three) times daily. (Patient not taking: Reported on 10/18/2015) 30 tablet 3  . promethazine (PHENERGAN) 25 MG tablet Take 1 tablet (25 mg total) by mouth every 6 (six) hours as needed for nausea or vomiting. (Patient not taking: Reported on 10/18/2015) 30 tablet 1  . terconazole (TERAZOL 7) 0.4 % vaginal cream Place 1 applicator vaginally at bedtime. 90 g 0  . [DISCONTINUED] ranitidine (ZANTAC) 150 MG tablet Take 1 tablet (150 mg total) by mouth 2 (two) times daily. (Patient not taking: Reported on 01/16/2014) 60 tablet 0    Allergies  Allergen Reactions  . Penicillins Anaphylaxis    Has patient had a PCN reaction causing immediate rash, facial/tongue/throat swelling, SOB or lightheadedness with hypotension: Yes Has patient had a PCN reaction causing severe rash involving mucus membranes or skin necrosis: Yes Has patient had a PCN reaction that required hospitalization Yes Has patient had a PCN reaction occurring within the last 10 years: No If all of the above answers are "NO", then may proceed with Cephalosporin use.  . Latex Itching and Rash    I have reviewed patient's Past Medical Hx, Surgical Hx, Family Hx, Social Hx, medications and allergies.   ROS:  Review of Systems  Constitutional: Negative for chills and fever.  Respiratory: Negative for shortness of breath.   Gastrointestinal: Positive for abdominal pain. Negative for constipation, diarrhea, nausea and vomiting.  Genitourinary: Positive for dysuria and pelvic pain.  Musculoskeletal: Positive for back pain.   Review of Systems  Other systems negative   Physical Exam  Patient Vitals for the past 24 hrs:  BP Temp Pulse Resp  11/08/15 1843 125/70 98.7 F (37.1 C) 99 18   Physical Exam  Constitutional: Well-developed, well-nourished female in no acute distress.  Cardiovascular: normal rate Respiratory: normal effort GI: Abd soft, non-tender. Pos BS x 4 MS: Extremities nontender, no edema, normal ROM Neurologic: Alert and oriented x 4.  GU: Neg CVAT.  Does point to lower to middle of back (waistline to lower) as location of pain  PELVIC EXAM: Pelvic deferred. Patient declines to get undressed    LAB RESULTS Results for orders placed or performed during the hospital encounter of 11/08/15 (from the past 24 hour(s))  CBC with Differential/Platelet     Status: Abnormal   Collection Time: 11/08/15  7:44 PM  Result Value Ref Range   WBC 7.6 4.0 - 10.5 K/uL   RBC 3.63 (L) 3.87 - 5.11 MIL/uL   Hemoglobin 11.0 (L) 12.0 - 15.0 g/dL    HCT 16.1 (L) 09.6 - 46.0 %   MCV 85.4 78.0 - 100.0 fL   MCH 30.3 26.0 - 34.0 pg   MCHC 35.5 30.0 - 36.0 g/dL   RDW 04.5 40.9 - 81.1 %   Platelets 258 150 - 400 K/uL   Neutrophils Relative % 71 %   Neutro Abs 5.4 1.7 - 7.7 K/uL   Lymphocytes Relative 22 %   Lymphs Abs 1.7 0.7 - 4.0 K/uL   Monocytes Relative 5 %   Monocytes Absolute 0.4 0.1 - 1.0 K/uL   Eosinophils Relative 2 %   Eosinophils Absolute 0.1 0.0 - 0.7 K/uL   Basophils Relative 0 %   Basophils Absolute 0.0 0.0 - 0.1 K/uL  Results for ANTONIETTE, PEAKE (MRN [id number]) as of 11/08/2015 21:18  Ref. Range 11/08/2015 16:43 11/08/2015 17:07  Bacteria, U Microscopic Unknown 3+  Bilirubin, UA Unknown  neg  Casts, Ur, LPF, POC Unknown none   Clarity, UA Unknown  cloudy  Color, UA Unknown  yellow  Crystals, Ur, HPF, POC Unknown none   Epithelial cells, urine per micros Unknown 5-10   Glucose Unknown  neg  Ketones, UA Unknown  neg  Leukocytes, UA Latest Ref Range: Negative   Trace (A)  Nitrite, UA Unknown  pos  pH, UA Unknown  6.0  Protein, UA Unknown  neg  RBC, UA Unknown  small  RBC, urine, microscopic Unknown 0-2   Specific Gravity, UA Unknown  1.025  Urobilinogen, UA Unknown  0.2  WBC, Ur, HPF, POC Unknown 5-10   Yeast, UA Unknown none     A/POS/-- (01/11 0950)  IMAGING [place] Ob Comp Less 14 Wks  Result Date: 10/18/2015 CLINICAL DATA:  Heavy vaginal bleeding this morning. By LMP patient is 10 weeks 0 days. EDC by LMP is 05/15/2016. EXAM: OBSTETRIC <14 WK ULTRASOUND TECHNIQUE: Transabdominal ultrasound was performed for evaluation of the gestation as well as the maternal uterus and adnexal regions. COMPARISON:  09/26/2015 FINDINGS: Intrauterine gestational sac: Present Yolk sac:  Present Embryo:  Present Cardiac Activity: Present Heart Rate: 171 bpm CRL:   31  mm   10 w 0 d                  US EDC: 05/15/2016 Subchorionic hemorrhage:  Small subchorionic hemorrhage is present. Maternal uterus/adnexae: Ovaries are normal in  appearance. No free pelvic fluid. IMPRESSION: 1. Single living intrauterine embryo. 2. Size and dates correlate well. Today's exam confirms clinical EDC of 05/15/2016. 3. Small subchorionic hemorrhage noted. Electronically Signed   By: Norva PavlovElizabeth  Brown M.D.   On: 10/18/2015 16:41    MAU Management/MDM: Kenard Gowerrew CBC to determine if she has leukocytosis     WBC is normal with no left shift. Already treated for Trich at office Afebrile here. No report of fever at home Consulted Dr Alysia PennaErvin with presentation, exam findings and lab results He recommends outpatient treatment since she has no evidence of pyelonephritis Will give a dose of Gentamycin IV here prior to discharge (sensitive per last urine culture) Then will send home on Macrobid (already ordered)  ASSESSMENT SIUP at 4867w0d Urinary tract infection, persistent due to noncompliance with medication regimen No evidence of acute pyelonephritis, normal WBC and no fever.  Trichomonas, already treated  PLAN Discharge home Has Rx for Macrobid for 7 days  Pt stable at time of discharge. Encouraged to return here or to other Urgent Care/ED if she develops worsening of symptoms, increase in pain, fever, or other concerning symptoms.    Wynelle BourgeoisMarie Williams CNM, MSN Certified Nurse-Midwife 11/08/2015  7:52 PM

## 2015-11-08 NOTE — Progress Notes (Signed)
[facility] Phone: [phone]   Date of Visit: 11/08/2015   HPI:  Vaginal Discharge and Possible UTI:  - vaginal discharge started 3 days ago - thin white discharge with odor - skin in your inner thigh is red for about 1 week; has been using barrier cream which is helping  - no vaginal bleeding (since last seen in the MAU on 10/5 and found to have subchorionic hematoma)  - was diagnosed with UTI and yeast infection on 10/5 at [facility] hospital: prescribed Macrobid and Diflucan but did not get due to insurance coverage.  - unprotected sexual intercourse with the same partner; similar to symptoms to past  - no dysuria, notes of increased frequency and urgency, and odor to urine  - reports of left sided back pain prior to fall  - reports lower abdominal pain  - no fever or chills  - has history of pyelonephritis requiring admission in the past. Reports symptoms of back and abdominal pain are similar to that episode.   Fall:  - Walking down the steps (wooden)  And fell on bottom and lower back. Did not hit abdomen  - now has some soreness of her bottom and lower back .     Headaches:  - very painful HA with photophobia and phonophobia and blurred vision - symptoms for a week; squeezing in character  - frontal headache with radiation to the sides - occurs mainly at night and is intermittent  - reports it does wake her up from her sleep  - no prior history of HA, no history of migraines - no neck soreness - has been having emesis since becoming pregnant and this has not worsened; usually occurs once daily usually in the AM - Tylenol helped a little but makes her sleepy    ROS: See HPI.  PMFSH:  Subchorionic hematoma   PHYSICAL EXAM: BP 98/76 (BP Location: Left Arm, Patient Position: Sitting, Cuff Size: Normal)   Pulse (!) 102   Temp 98.4 F (36.9 C) (Oral)   Ht 5\' 2"  (1.575 m)   Wt 125 lb 3.2 oz (56.8 kg)   LMP 08/09/2015 (Approximate)   BMI  22.90 kg/m  GEN: NAD HEENT: somewhat dry MM CV: tachycardia, regular rhythm, no murmurs, rubs, or gallops PULM: CTAB, normal effort ABD: Soft, suprapubic and left lower quadrant tenderness, no guarding or rebound. Some CVA tenderness of the R side. Some tenderness to palpation of sacral region and upper buttock region. Tenderness of the paraspinal muscles of the lumbar spine.  GU: Female genitalia: erythema of the labia majora and mons pubis with some mild extension to the bilateral thigh consistent with cutaneous candidal infection;  Norma appearing vaginal and cervix. Copious white frothy discharge in the vaginal. Bimaual Exam: no adnexal mass/tenderness, no cervical motion tenderness. SKIN: No rash or cyanosis; warm and well-perfused PSYCH: Mood and affect euthymic, normal rate and volume of speech NEURO: Awake, alert, CN 2-12 intact, normal strength and sensation to light touch in upper and lower extremities bilaterally, normal speech. Normal gait.    ASSESSMENT/PLAN:  UTI with concern for pyelonephritis:  UA with LE and nitrites. Patient also has some CVA tenderness. Is afebrile. With history of pyelonephritis and patient report of similar symptoms current and since she is pregnant, will sent to MAU for admission and for IV antibiotics. Called MAU to inform. Urine Culture obtained  Vaginal Discharge: Trichomoniasis - treated in clinic with Flagyl 2g x 1  - advised that partner gets  treated - Gc/Chlamydia sent   Vulvovaginal candidiasis:  - Terconazole 0.4% vaginal cream x 7 days   Headaches: possibly migrainous in etiology although patient does not have history of migraines. Neuroexam is normal. No signs of meningitis.  - recommended staying hydrated - Tylenol PRN - return precautions discussed.    Rachel HolterKanishka G Kimberlyn Quiocho, MD PGY 2 [facility]

## 2015-11-08 NOTE — Discharge Instructions (Signed)

## 2015-11-08 NOTE — Patient Instructions (Addendum)
You have a UTI. We are concerned that you may have a kidney infection.   They will provide medication for your yeast infection as well.

## 2015-11-09 LAB — CERVICOVAGINAL ANCILLARY ONLY
Chlamydia: NEGATIVE
Neisseria Gonorrhea: NEGATIVE
Trichomonas: NEGATIVE

## 2015-11-11 LAB — CULTURE, OB URINE

## 2015-11-22 ENCOUNTER — Encounter (INDEPENDENT_AMBULATORY_CARE_PROVIDER_SITE_OTHER): Payer: Medicaid Other | Admitting: Licensed Clinical Social Worker

## 2015-11-22 ENCOUNTER — Other Ambulatory Visit (INDEPENDENT_AMBULATORY_CARE_PROVIDER_SITE_OTHER): Payer: Medicaid Other

## 2015-11-22 ENCOUNTER — Ambulatory Visit (INDEPENDENT_AMBULATORY_CARE_PROVIDER_SITE_OTHER): Payer: Medicaid Other | Admitting: Family Medicine

## 2015-11-22 ENCOUNTER — Encounter: Payer: Self-pay | Admitting: Family Medicine

## 2015-11-22 VITALS — BP 98/60 | HR 91 | Temp 98.6°F | Ht 62.0 in | Wt 125.8 lb

## 2015-11-22 DIAGNOSIS — F314 Bipolar disorder, current episode depressed, severe, without psychotic features: Secondary | ICD-10-CM

## 2015-11-22 DIAGNOSIS — R21 Rash and other nonspecific skin eruption: Secondary | ICD-10-CM | POA: Insufficient documentation

## 2015-11-22 DIAGNOSIS — Z3490 Encounter for supervision of normal pregnancy, unspecified, unspecified trimester: Secondary | ICD-10-CM | POA: Diagnosis not present

## 2015-11-22 LAB — POCT URINALYSIS DIPSTICK
Glucose, UA: NEGATIVE
Ketones, UA: 15
Leukocytes, UA: NEGATIVE
Nitrite, UA: NEGATIVE
Protein, UA: 30
Spec Grav, UA: >=1.03
Urobilinogen, UA: 0.2
pH, UA: 5.5

## 2015-11-22 LAB — POCT UA - MICROSCOPIC ONLY: Epithelial cells, urine per micros: >20

## 2015-11-22 MED ORDER — PERMETHRIN 5 % EX CREA: 1.0000 "application " | TOPICAL_CREAM | Freq: Once | CUTANEOUS | 0 refills | Status: AC

## 2015-11-22 MED ORDER — CETIRIZINE HCL 10 MG PO TABS: 10.0000 mg | ORAL_TABLET | Freq: Every day | ORAL | 1 refills | Status: DC

## 2015-11-22 NOTE — Progress Notes (Signed)
Patient ID: Rachel Devoidshley D Harries, female   DOB: July 27, 1985, 30 y.o.   MRN: [id number]  Warm handoff   SUBJECTIVE: Rachel Lloyd is a 30 y.o. female  referred by Dr. Leveda AnnaHensel for:  depression and homeless. Patient is currently expecting her 4th child.  [facility], patient appreciative of support offered, verbal consent received.  Pt. reports the following symptoms/concerns depression, fatigue, loss of interest in favorite activities and sleep disturbance.:   Duration of problem:  Several months since she lost housing Previous treatment: patient currently receiving therapy daily in treatment program. 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 What is important to pt (values): getting her children back, children currently living with their father.  THE FOLLOW WAS DISCUSSED:Current stressors, current and new coping skills, goals, care management services and community resources.   GOALS ADDRESSED:  Housing and managing depression.   INTERVENTIONS:   Reflective listening, and emotional support.    ASSESSMENT:  Pt currently experiencing depression. Symptoms  exacerbated by psychosocial stressors financial, no housing and children temp not with with her.  Patient states her depression is situational and she manages by going to her treatment program daily.  Patient declined all services offered as she says she has done everything at this time.    PLAN: 1.  Patient will continue to live at maternity house 2. She will continue to look for employment 3. Patient will continue with treatment at [facility]" 4. Will call LCSW if she needs additional support.   Sammuel Hineseborah Seena Ritacco, LCSW Licensed Clinical Social Worker [facility]   [phone] 10:22 AM

## 2015-11-22 NOTE — Patient Instructions (Signed)
I cannot be sure what is causing the rash.   It may be just an allergic or irritant reaction. Because of how you got it and because of the intense itching I am worried about the possibility of scabies.  There is no test for me to prove this. I sent prescriptions for the itching and to treat the possible scabies to the pharmacy.   I would assume that you are infectious until you are treated.

## 2015-11-22 NOTE — Assessment & Plan Note (Signed)
Situationally much worse.  Asked social worker to see about option and about integrative care counseling.

## 2015-11-22 NOTE — Assessment & Plan Note (Signed)
Given the story, I am quite concerned about scabies.  Permethrin in preg category B and will use. Also antihistamine for pruritis.

## 2015-11-22 NOTE — Addendum Note (Signed)
Addended by: Jennette BillBUSICK, ROBERT L on: 11/22/2015 11:23 AM   Modules accepted: Orders

## 2015-11-22 NOTE — Progress Notes (Signed)
   Subjective:    Patient ID: Rachel Lloyd, female    DOB: 15-Aug-1985, 30 y.o.   MRN: [id number]  HPI Sad story.  Patient is [redacted] weeks pregnant, has lost her job and is now homeless.  Of course, depressed about this.  No HI or SI but her PHQ 9 score is 24.   Recently evicted from her apartment over failure to pay rent.  [facility] which [facility] for homeless pregnant women.  After sleeping there in their blanket, developed intensely pruritic rash on arms.  Continues.      Review of Systems     Objective:   Physical Exam  Non descript lesions which could easily be bites.  A dozen or so lesions on each arm.  Nothing on torso, neck or face.        Assessment & Plan:

## 2015-11-23 LAB — OBSTETRIC PANEL
Antibody Screen: NEGATIVE
Basophils Absolute: 0 cells/uL (ref 0–200)
Basophils Relative: 0 %
Eosinophils Absolute: 71 cells/uL (ref 15–500)
Eosinophils Relative: 1 %
HCT: 35.7 % (ref 35.0–45.0)
Hemoglobin: 11.9 g/dL (ref 11.7–15.5)
Hepatitis B Surface Ag: NEGATIVE
Lymphocytes Relative: 21 %
Lymphs Abs: 1491 cells/uL (ref 850–3900)
MCH: 29.8 pg (ref 27.0–33.0)
MCHC: 33.3 g/dL (ref 32.0–36.0)
MCV: 89.5 fL (ref 80.0–100.0)
MPV: 8.8 fL (ref 7.5–12.5)
Monocytes Absolute: 426 cells/uL (ref 200–950)
Monocytes Relative: 6 %
Neutro Abs: 5112 cells/uL (ref 1500–7800)
Neutrophils Relative %: 72 %
Platelets: 256 10*3/uL (ref 140–400)
RBC: 3.99 MIL/uL (ref 3.80–5.10)
RDW: 13.2 % (ref 11.0–15.0)
Rh Type: POSITIVE
Rubella: 9.43 Index — ABNORMAL HIGH (ref <0.90)
WBC: 7.1 10*3/uL (ref 3.8–10.8)

## 2015-11-23 LAB — HIV ANTIBODY (ROUTINE TESTING W REFLEX): HIV 1&2 Ab, 4th Generation: NONREACTIVE

## 2015-11-24 LAB — CULTURE, OB URINE: Organism ID, Bacteria: NO GROWTH

## 2015-11-29 ENCOUNTER — Encounter: Payer: Self-pay | Admitting: Family Medicine

## 2015-12-24 ENCOUNTER — Ambulatory Visit (INDEPENDENT_AMBULATORY_CARE_PROVIDER_SITE_OTHER): Payer: Medicaid Other | Admitting: Family Medicine

## 2015-12-24 VITALS — BP 107/68 | Temp 98.2°F | Wt 131.0 lb

## 2015-12-24 DIAGNOSIS — R109 Unspecified abdominal pain: Secondary | ICD-10-CM

## 2015-12-24 MED ORDER — DOCUSATE SODIUM 100 MG PO CAPS: 100.0000 mg | ORAL_CAPSULE | Freq: Two times a day (BID) | ORAL | 0 refills | Status: DC

## 2015-12-24 MED ORDER — POLYETHYLENE GLYCOL 3350 17 GM/SCOOP PO POWD: 17.0000 g | Freq: Two times a day (BID) | ORAL | 1 refills | Status: DC | PRN

## 2015-12-24 NOTE — Progress Notes (Signed)
    Subjective:  Rachel Lloyd is a 30 y.o. female who presents to the [facility] today with a chief complaint of abdominal pain.   HPI:  LLQ Abdominal Pain Patient is approximately [redacted] weeks pregnant. She started having mild to moderate LLQ pain about two days ago. Patient thinks that it is due to her constipation. She last had a BM two days ago and said that the stool was hard and she had to strain to get it out. No vaginal discharge. No vaginal bleeding. Nothing that feels like contractions. No fevers. No nausea or vomiting. She has not tried any medications for this.   ROS: Per HPI  Objective:  Physical Exam: BP 107/68   Temp 98.2 F (36.8 C)   Wt 131 lb (59.4 kg)   LMP 08/09/2015 (Approximate)   BMI 23.96 kg/m   Gen: NAD, resting comfortably CV: RRR with no murmurs appreciated Pulm: NWOB, CTAB with no crackles, wheezes, or rhonchi GI: Obese, Soft. Nontender. Nondistended. No rebound or guarding.  MSK: no edema, cyanosis, or clubbing noted Skin: warm, dry Neuro: grossly normal, moves all extremities Psych: Normal affect and thought content  FHT: 150bpm  Assessment/Plan:  Abdominal Pain Likely secondary to constipation - will treat with colace and miralax. Additionally, recommended a high fiber diet. Patient may also have a component of round ligament pain that is contributing - recommended OTC tylenol as needed. Doubt ectopic given that patient is [redacted] weeks pregnant. Doubt uterine rupture or miscarriage as patient has not had any vaginal bleeding. Strict return precautions reviewed. Instructed patient to go to [facility]U if starts having severe pain or vaginal bleeding. Has an OB appointment next week.   Katina Degreealeb M. Jimmey RalphParker, MD [facility] Resident PGY-3 12/24/2015 3:45 PM

## 2015-12-24 NOTE — Patient Instructions (Signed)
Your baby's heart rate was normal today.  Your constipation could be causing abdominal pain. Please take the colace and miralax daily so that you have a soft BM everday.  You may also be having round ligament pain - you can take tylenol as needed for this.  Come back next week for your OB appointment.  Take care,  Dr Jimmey RalphParker

## 2015-12-25 ENCOUNTER — Encounter: Payer: Self-pay | Admitting: Family Medicine

## 2015-12-31 ENCOUNTER — Ambulatory Visit (INDEPENDENT_AMBULATORY_CARE_PROVIDER_SITE_OTHER): Payer: Medicaid Other | Admitting: Family Medicine

## 2015-12-31 ENCOUNTER — Other Ambulatory Visit (HOSPITAL_COMMUNITY)
Admission: RE | Admit: 2015-12-31 | Discharge: 2015-12-31 | Disposition: A | Discharge status: Home / Self Care | Payer: Medicaid Other | Source: Ambulatory Visit | Attending: Family Medicine | Admitting: Family Medicine

## 2015-12-31 VITALS — BP 106/84 | HR 91 | Temp 98.5°F | Wt 136.2 lb

## 2015-12-31 DIAGNOSIS — Z7251 High risk heterosexual behavior: Secondary | ICD-10-CM | POA: Diagnosis not present

## 2015-12-31 DIAGNOSIS — N898 Other specified noninflammatory disorders of vagina: Secondary | ICD-10-CM | POA: Diagnosis not present

## 2015-12-31 DIAGNOSIS — Z113 Encounter for screening for infections with a predominantly sexual mode of transmission: Secondary | ICD-10-CM | POA: Diagnosis present

## 2015-12-31 DIAGNOSIS — Z3A20 20 weeks gestation of pregnancy: Secondary | ICD-10-CM

## 2015-12-31 DIAGNOSIS — O26892 Other specified pregnancy related conditions, second trimester: Secondary | ICD-10-CM | POA: Diagnosis not present

## 2015-12-31 LAB — POCT WET PREP (WET MOUNT)
Clue Cells Wet Prep Whiff POC: POSITIVE
Trichomonas Wet Prep HPF POC: ABSENT

## 2015-12-31 MED ORDER — PRENATAL 27-0.8 MG PO TABS: 1.0000 | ORAL_TABLET | Freq: Every day | ORAL | 11 refills | Status: DC

## 2015-12-31 MED ORDER — METRONIDAZOLE 500 MG PO TABS: 500.0000 mg | ORAL_TABLET | Freq: Two times a day (BID) | ORAL | 0 refills | Status: DC

## 2015-12-31 NOTE — Progress Notes (Signed)
Rachel Lloyd is a 30 y.o. yo [phone] at 6w4dwho presents for her initial prenatal visit.  She has a very complicated social history. She recently lost her job, became homeless, and [facility]. She notes the FOB is not involved (she doesn't even like to discuss his name). She notes she has intermittently smoked cigarettes and marijuana this pregnancy due to stress. She has met with our CSW recently and does not wish to meet with her today as she feels she has all the resources again.   Pregnancy is not planned. She reports intermittently N/V. She  is not taking PNV- she was never given a Rx.  See flow sheet for details.  PMH, POBH, FH, meds, allergies and Social Hx reviewed. Specifically, no cat exposure. +tobacco and THC use. No medications. She was diagnosed with hypertension during one of her pregnancies but no other complications. No h/o GDM or family h/o DM.   Prenatal Exam: Gen: Well nourished, well developed.  No distress.  Vitals noted. HEENT: Normocephalic, atraumatic.  Neck supple without cervical lymphadenopathy, thyromegaly or thyroid nodules.  Poor dentition. CV: RRR no murmur, gallops or rubs Lungs: CTAB.  Normal respiratory effort without wheezes or rales. Abd: soft, NTND. +BS.  Uterus gravid.  GU: Normal external female genitalia without lesions.  Normal vaginal, well rugated without lesions. Moderate amount of white frothy discharge.  Bimanual exam: No adnexal mass or TTP. No CMT.  Ext: No clubbing, cyanosis or edema. Psych: Normal grooming and dress.  Not depressed or anxious appearing.  Normal thought content and process without flight of ideas or looseness of associations.  Assessment & Plan: 1) 30y.o. yo [id number] 220w4dia LMP, early ultrasound doing well.  Current pregnancy issues include THC and intermittent tobacco use, homelessness Dating is reliable. Prenatal labs reviewed, notable for  trichomonas in 10/2015, repeat testing with BV but negative trich. Repeat GC/chlamydia pending. Genetic screening: too late. Early glucola is not indicated.  Anatomy scan ordered Patient declines flu vaccine.  Initial U/S with small subchorionic hemorrhage noted, no reported bleeding.  Anatomy scan ordered.  H/o HTN per her report during a previous pregnancy but no pre-eclampsia, will monitor BP control. No indication for high risk OB referral at this time.  Patient desires a BTL, will need consent signed at next visit  Bleeding and pain precautions reviewed. Importance of prenatal vitamins reviewed, Rx sent in.  Rx for Flagyl prescribed.  Follow up in 4 weeks.

## 2015-12-31 NOTE — Patient Instructions (Addendum)
I prescribed you Flagyl for bacterial vaginosis. STOP smoking, it is not good for the baby or you. I have placed an order for you to get an anatomy ultrasound. START taking prenatal vitamins daily.  Second Trimester of Pregnancy The second trimester is from week 13 through week 28 (months 4 through 6). The second trimester is often a time when you feel your best. Your body has also adjusted to being pregnant, and you begin to feel better physically. Usually, morning sickness has lessened or quit completely, you may have more energy, and you may have an increase in appetite. The second trimester is also a time when the fetus is growing rapidly. At the end of the sixth month, the fetus is about 9 inches long and weighs about 1 pounds. You will likely begin to feel the baby move (quickening) between 18 and 20 weeks of the pregnancy. Body changes during your second trimester Your body continues to go through many changes during your second trimester. The changes vary from woman to woman.  Your weight will continue to increase. You will notice your lower abdomen bulging out.  You may begin to get stretch marks on your hips, abdomen, and breasts.  You may develop headaches that can be relieved by medicines. The medicines should be approved by your health care provider.  You may urinate more often because the fetus is pressing on your bladder.  You may develop or continue to have heartburn as a result of your pregnancy.  You may develop constipation because certain hormones are causing the muscles that push waste through your intestines to slow down.  You may develop hemorrhoids or swollen, bulging veins (varicose veins).  You may have back pain. This is caused by:  Weight gain.  Pregnancy hormones that are relaxing the joints in your pelvis.  A shift in weight and the muscles that support your balance.  Your breasts will continue to grow and they will continue to become tender.  Your  gums may bleed and may be sensitive to brushing and flossing.  Dark spots or blotches (chloasma, mask of pregnancy) may develop on your face. This will likely fade after the baby is born.  A dark line from your belly button to the pubic area (linea nigra) may appear. This will likely fade after the baby is born.  You may have changes in your hair. These can include thickening of your hair, rapid growth, and changes in texture. Some women also have hair loss during or after pregnancy, or hair that feels dry or thin. Your hair will most likely return to normal after your baby is born. What to expect at prenatal visits During a routine prenatal visit:  You will be weighed to make sure you and the fetus are growing normally.  Your blood pressure will be taken.  Your abdomen will be measured to track your baby's growth.  The fetal heartbeat will be listened to.  Any test results from the previous visit will be discussed. Your health care provider may ask you:  How you are feeling.  If you are feeling the baby move.  If you have had any abnormal symptoms, such as leaking fluid, bleeding, severe headaches, or abdominal cramping.  If you are using any tobacco products, including cigarettes, chewing tobacco, and electronic cigarettes.  If you have any questions. Other tests that may be performed during your second trimester include:  Blood tests that check for:  Low iron levels (anemia).  Gestational diabetes (between 65  and 28 weeks).  Rh antibodies. This is to check for a protein on red blood cells (Rh factor).  Urine tests to check for infections, diabetes, or protein in the urine.  An ultrasound to confirm the proper growth and development of the baby.  An amniocentesis to check for possible genetic problems.  Fetal screens for spina bifida and Down syndrome.  HIV (human immunodeficiency virus) testing. Routine prenatal testing includes screening for HIV, unless you choose  not to have this test. Follow these instructions at home: Eating and drinking  Continue to eat regular, healthy meals.  Avoid raw meat, uncooked cheese, cat litter boxes, and soil used by cats. These carry germs that can cause birth defects in the baby.  Take your prenatal vitamins.  Take 1500-2000 mg of calcium daily starting at the 20th week of pregnancy until you deliver your baby.  If you develop constipation:  Take over-the-counter or prescription medicines.  Drink enough fluid to keep your urine clear or pale yellow.  Eat foods that are high in fiber, such as fresh fruits and vegetables, whole grains, and beans.  Limit foods that are high in fat and processed sugars, such as fried and sweet foods. Activity  Exercise only as directed by your health care provider. Experiencing uterine cramps is a good sign to stop exercising.  Avoid heavy lifting, wear low heel shoes, and practice good posture.  Wear your seat belt at all times when driving.  Rest with your legs elevated if you have leg cramps or low back pain.  Wear a good support bra for breast tenderness.  Do not use hot tubs, steam rooms, or saunas. Lifestyle  Avoid all smoking, herbs, alcohol, and unprescribed drugs. These chemicals affect the formation and growth of the baby.  Do not use any products that contain nicotine or tobacco, such as cigarettes and e-cigarettes. If you need help quitting, ask your health care provider.  A sexual relationship may be continued unless your health care provider directs you otherwise. General instructions  Follow your health care provider's instructions regarding medicine use. There are medicines that are either safe or unsafe to take during pregnancy.  Take warm sitz baths to soothe any pain or discomfort caused by hemorrhoids. Use hemorrhoid cream if your health care provider approves.  If you develop varicose veins, wear support hose. Elevate your feet for 15 minutes,  3-4 times a day. Limit salt in your diet.  Visit your dentist if you have not gone yet during your pregnancy. Use a soft toothbrush to brush your teeth and be gentle when you floss.  Keep all follow-up prenatal visits as told by your health care provider. This is important. Contact a health care provider if:  You have dizziness.  You have mild pelvic cramps, pelvic pressure, or nagging pain in the abdominal area.  You have persistent nausea, vomiting, or diarrhea.  You have a bad smelling vaginal discharge.  You have pain with urination. Get help right away if:  You have a fever.  You are leaking fluid from your vagina.  You have spotting or bleeding from your vagina.  You have severe abdominal cramping or pain.  You have rapid weight gain or weight loss.  You have shortness of breath with chest pain.  You notice sudden or extreme swelling of your face, hands, ankles, feet, or legs.  You have not felt your baby move in over an hour.  You have severe headaches that do not go away with medicine.  You have vision changes. Summary  The second trimester is from week 13 through week 28 (months 4 through 6). It is also a time when the fetus is growing rapidly.  Your body goes through many changes during pregnancy. The changes vary from woman to woman.  Avoid all smoking, herbs, alcohol, and unprescribed drugs. These chemicals affect the formation and growth your baby.  Do not use any tobacco products, such as cigarettes, chewing tobacco, and e-cigarettes. If you need help quitting, ask your health care provider.  Contact your health care provider if you have any questions. Keep all prenatal visits as told by your health care provider. This is important. This information is not intended to replace advice given to you by your health care provider. Make sure you discuss any questions you have with your health care provider. Document Released: 12/24/2000 Document Revised:  06/07/2015 Document Reviewed: 03/02/2012 Elsevier Interactive Patient Education  2017 [employer]

## 2016-01-01 ENCOUNTER — Telehealth: Payer: Self-pay | Admitting: *Deleted

## 2016-01-01 DIAGNOSIS — Z3482 Encounter for supervision of other normal pregnancy, second trimester: Secondary | ICD-10-CM

## 2016-01-01 LAB — CERVICOVAGINAL ANCILLARY ONLY
Chlamydia: NEGATIVE
Neisseria Gonorrhea: NEGATIVE

## 2016-01-01 NOTE — Telephone Encounter (Signed)
MFM calling needing patients u/s order corrected. Order placed.

## 2016-01-02 ENCOUNTER — Telehealth: Payer: Self-pay | Admitting: Family Medicine

## 2016-01-02 ENCOUNTER — Ambulatory Visit (HOSPITAL_COMMUNITY): Payer: Medicaid Other

## 2016-01-02 ENCOUNTER — Ambulatory Visit (HOSPITAL_COMMUNITY)
Admission: RE | Admit: 2016-01-02 | Discharge: 2016-01-02 | Disposition: A | Discharge status: Home / Self Care | Payer: Medicaid Other | Source: Ambulatory Visit | Attending: Family Medicine | Admitting: Family Medicine

## 2016-01-02 ENCOUNTER — Other Ambulatory Visit (HOSPITAL_COMMUNITY): Payer: Self-pay | Admitting: *Deleted

## 2016-01-02 DIAGNOSIS — O365920 Maternal care for other known or suspected poor fetal growth, second trimester, not applicable or unspecified: Secondary | ICD-10-CM

## 2016-01-02 DIAGNOSIS — Z363 Encounter for antenatal screening for malformations: Secondary | ICD-10-CM | POA: Diagnosis present

## 2016-01-02 DIAGNOSIS — Z3A20 20 weeks gestation of pregnancy: Secondary | ICD-10-CM | POA: Insufficient documentation

## 2016-01-02 DIAGNOSIS — Z3482 Encounter for supervision of other normal pregnancy, second trimester: Secondary | ICD-10-CM

## 2016-01-02 NOTE — Addendum Note (Signed)
Encounter addended by: Marcellina MillinKelly L Kearah Gayden on: 01/02/2016  2:45 PM<BR>    Actions taken: Imaging Exam ended

## 2016-01-02 NOTE — Telephone Encounter (Signed)
Please call and let the patient know that gonorrhea and chlamydia are negative.  Thanks, Joanna Puffrystal S. Colie Josten, MD [facility] Resident  01/02/2016, 7:14 AM

## 2016-01-02 NOTE — Telephone Encounter (Signed)
Called patient no option to leave message. Will call again. Maryjean Mornempestt S Terrill Alperin, CMA

## 2016-01-09 ENCOUNTER — Telehealth: Payer: Self-pay | Admitting: Family Medicine

## 2016-01-09 DIAGNOSIS — O365990 Maternal care for other known or suspected poor fetal growth, unspecified trimester, not applicable or unspecified: Secondary | ICD-10-CM

## 2016-01-09 NOTE — Telephone Encounter (Signed)
Reviewed ultrasound result from 12/20, which shows growth restriction - EFW at <25th percentile (off by 2 weeks).   Ultrasound report signed by Dr. Ezzard StandingNewman recommends follow up evaluation in 2 weeks, review of prior dating criteria, and drawing labs (TORCH titers and NIPT to assess for aneuploidy). Patient has appointment scheduled with MFM ultrasound dept for f/u next week.   I called MFM office to ask about these recommendations and labs, as we don't routinely draw these [facility]. Was informed that Dr. Ezzard StandingNewman is not available to discuss recommendations until next week. Was told that [facility] can be drawn at [facility]FM office during that ultrasound appointment - NIPT would require a visit with genetic counseling. MFM supervisor advised that they are recommending that patient be managed at high risk OB clinic due to the severity of the growth restriction.  Attempted to contact patient to speak with her about this result and need for her to transfer care to high risk clinic. Called x3, with no answer and no voicemail set up. Will attempt another time. High risk OB referral placed. Will also need to attend follow up appointment as scheduled with MFM department, at which time this can all be followed up on.  Note - I am not sure why this result came to me - she is a patient of Dr. Derek Moundorsey's but it appears I was listed as the authorizing provider when the ultrasound was ordered by one of our clinic staff. Spoke with Dr. Leonides Schanzorsey, who said this result never came to her inbox. Dr. Leonides Schanzorsey is aware that patient is being referred to high risk OB clinic.  Latrelle DodrillBrittany J Marcel Sorter, MD

## 2016-01-10 ENCOUNTER — Telehealth: Payer: Self-pay

## 2016-01-10 ENCOUNTER — Other Ambulatory Visit: Payer: Self-pay | Admitting: Family Medicine

## 2016-01-10 DIAGNOSIS — O365990 Maternal care for other known or suspected poor fetal growth, unspecified trimester, not applicable or unspecified: Secondary | ICD-10-CM

## 2016-01-10 NOTE — Telephone Encounter (Signed)
Pt would like to talk to Dr. Leonides Schanzorsey. Phone number was changed and updated. [phone]). Very concerned about US results. Was told baby may have downs syndrome. Has a lot of questions. Please call today if possible. Sunday SpillersSharon T Ramere Downs, CMA

## 2016-01-10 NOTE — Telephone Encounter (Signed)
   Returned patient's call. She seemed confused on whether the man who came in to talk to her about her ultrasound said "to worry" or "not to worry."  She really doesn't want her baby to have Down syndrome. I discussed that we have placed a referral to OB/gyn for high risk pregnancies given the restricted growth. Discussed that this could be caused by several things, a few include infections like those they'll test her for and chromosomal abnormalities. Discussed not worrying too much until further evaluation can be done.   Patient then discussed lower abdominal pain in the left side for the last 3 days. She still has foul discharge. She also endorses nausea intermittently. She didn't take Flagyl that was prescribed previously as there was issues with the pharmacy she picked out. She denies vaginal bleeding.   I discussed that given abdominal pain, she should be evaluated by the MAU given current symptoms. Patient notes she would like to try the medication prior to going to be evaluated. I discussed strict precautions with her an again urged her to be seen at the MAU.  Rachel Puffrystal S. Romeka Scifres, MD [facility] Resident  01/10/2016, 10:38 AM

## 2016-01-11 ENCOUNTER — Encounter: Payer: Self-pay | Admitting: Family Medicine

## 2016-01-11 DIAGNOSIS — O365990 Maternal care for other known or suspected poor fetal growth, unspecified trimester, not applicable or unspecified: Secondary | ICD-10-CM | POA: Insufficient documentation

## 2016-01-11 NOTE — Telephone Encounter (Signed)
Note patient was contacted by Dr. Leonides Schanzorsey, see her phone note for details. Latrelle DodrillBrittany J McIntyre, MD

## 2016-01-12 ENCOUNTER — Encounter (HOSPITAL_COMMUNITY): Payer: Self-pay

## 2016-01-12 ENCOUNTER — Inpatient Hospital Stay (HOSPITAL_COMMUNITY)
Admission: AD | Admit: 2016-01-12 | Discharge: 2016-01-12 | Disposition: A | Discharge status: Home / Self Care | Payer: Medicaid Other | Source: Ambulatory Visit | Attending: Obstetrics and Gynecology | Admitting: Obstetrics and Gynecology

## 2016-01-12 ENCOUNTER — Telehealth: Payer: Self-pay | Admitting: Family Medicine

## 2016-01-12 DIAGNOSIS — O4692 Antepartum hemorrhage, unspecified, second trimester: Secondary | ICD-10-CM | POA: Insufficient documentation

## 2016-01-12 DIAGNOSIS — O26852 Spotting complicating pregnancy, second trimester: Secondary | ICD-10-CM | POA: Diagnosis present

## 2016-01-12 DIAGNOSIS — Z87891 Personal history of nicotine dependence: Secondary | ICD-10-CM | POA: Diagnosis not present

## 2016-01-12 DIAGNOSIS — O2342 Unspecified infection of urinary tract in pregnancy, second trimester: Secondary | ICD-10-CM

## 2016-01-12 DIAGNOSIS — Z3A22 22 weeks gestation of pregnancy: Secondary | ICD-10-CM | POA: Insufficient documentation

## 2016-01-12 DIAGNOSIS — N898 Other specified noninflammatory disorders of vagina: Secondary | ICD-10-CM

## 2016-01-12 DIAGNOSIS — O26892 Other specified pregnancy related conditions, second trimester: Secondary | ICD-10-CM

## 2016-01-12 LAB — WET PREP, GENITAL
Sperm: NONE SEEN
Trich, Wet Prep: NONE SEEN
Yeast Wet Prep HPF POC: NONE SEEN

## 2016-01-12 LAB — URINALYSIS, ROUTINE W REFLEX MICROSCOPIC
Bilirubin Urine: NEGATIVE
Bilirubin Urine: NEGATIVE
Glucose, UA: NEGATIVE mg/dL
Glucose, UA: NEGATIVE mg/dL
Ketones, ur: NEGATIVE mg/dL
Ketones, ur: NEGATIVE mg/dL
Nitrite: NEGATIVE
Nitrite: NEGATIVE
Protein, ur: 30 mg/dL — AB
Protein, ur: 30 mg/dL — AB
Specific Gravity, Urine: 1.023 (ref 1.005–1.030)
Specific Gravity, Urine: 1.021 (ref 1.005–1.030)
pH: 5 (ref 5.0–8.0)
pH: 5 (ref 5.0–8.0)

## 2016-01-12 MED ORDER — NITROFURANTOIN MONOHYD MACRO 100 MG PO CAPS: 100.0000 mg | ORAL_CAPSULE | Freq: Two times a day (BID) | ORAL | 0 refills | Status: DC

## 2016-01-12 NOTE — Discharge Instructions (Signed)
Vaginal Bleeding During Pregnancy, Second Trimester °A small amount of bleeding (spotting) from the vagina is common in pregnancy. Sometimes the bleeding is normal and is not a problem, and sometimes it is a sign of something serious. Be sure to tell your doctor about any bleeding from your vagina right away. °Follow these instructions at home: °· Watch your condition for any changes. °· Follow your doctor's instructions about how active you can be. °· If you are on bed rest: °¨ You may need to stay in bed and only get up to use the bathroom. °¨ You may be allowed to do some activities. °¨ If you need help, make plans for someone to help you. °· Write down: °¨ The number of pads you use each day. °¨ How often you change pads. °¨ How soaked (saturated) your pads are. °· Do not use tampons. °· Do not douche. °· Do not have sex or orgasms until your doctor says it is okay. °· If you pass any tissue from your vagina, save the tissue so you can show it to your doctor. °· Only take medicines as told by your doctor. °· Do not take aspirin because it can make you bleed. °· Do not exercise, lift heavy weights, or do any activities that take a lot of energy and effort unless your doctor says it is okay. °· Keep all follow-up visits as told by your doctor. °Contact a doctor if: °· You bleed from your vagina. °· You have cramps. °· You have labor pains. °· You have a fever that does not go away after you take medicine. °Get help right away if: °· You have very bad cramps in your back or belly (abdomen). °· You have contractions. °· You have chills. °· You pass large clots or tissue from your vagina. °· You bleed more. °· You feel light-headed or weak. °· You pass out (faint). °· You are leaking fluid or have a gush of fluid from your vagina. °This information is not intended to replace advice given to you by your health care provider. Make sure you discuss any questions you have with your health care provider. °Document  Released: 05/16/2013 Document Revised: 06/07/2015 Document Reviewed: 09/06/2012 °Elsevier Interactive Patient Education © 2017 Elsevier Inc. ° °

## 2016-01-12 NOTE — MAU Note (Signed)
Pt c/o lower abdominal and back cramping for several days and spotting that started tonight. Rates cramping 7/10. Took a warm shower and some tylenol around 1900-2000. States she fell asleep after that. Denies intercourse recently. Was told she has BV but has not filled RX yet. Was told she was high risk because she said baby was measuring small.

## 2016-01-12 NOTE — MAU Note (Signed)
My stomach keeps cramping and goes around to lower back and my private parts. Nausea. Symptoms present for 3 days. Spotting tonight bright red. No intercourse in last 24hrs

## 2016-01-12 NOTE — MAU Provider Note (Signed)
Chief Complaint:  Abdominal Cramping; Vaginal Bleeding; Back Pain; and Morning Sickness   First Provider Initiated Contact with Patient 01/12/16 0303      HPI: Rachel Lloyd is a 30 y.o. [id number] at 2246w2d who presents to maternity admissions reporting cramping, back pain, and spotting.  Starting 3 days ago, started cramping, comes/goes, feels like menstrual like cramping. Tylenol is not working. Pain wakes her out of sleep sometimes. Tried Ibuprofen helped some. Back pain started coming/going, located in low back. Also feels like menstrual cramps but sharper than the abdomen cramping. Bleeding started tonight, bright red spotting she noticed in her underwear. Then she showered, went to the bathroom, wiped, and had some streaks on the toilet paper as well. Denies clots, not heavy bleeding like a period. No intercourse recently. Had a recent BV infection, has not taken the meds yet due to her meds [facility]. +dysuria, +frequency, +urgency.   Has h/o STIs, trichomonas last month, TOC was neg last week. Years ago had GC/CT. She had a pregnancy loss due to pre-viable, preterm delivery a little over 20 weeks earlier this year (Jan or Feb).  Denies contractions but is having cramping, denies leakage of fluid. Good fetal movement.     Past Medical History: Past Medical History:  Diagnosis Date  . Chlamydia   . Hypertension    During second pregnancy  . Mental disorder 2010   bipolar  . Pneumonia   . Trichimoniasis     Past obstetric history: OB History  Gravida Para Term Preterm AB Living  8 3 3  0 4 3  SAB TAB Ectopic Multiple Live Births  1 3 0 0 3    # Outcome Date GA Lbr Len/2nd Weight Sex Delivery Anes PTL Lv  8 Current           7 TAB 05/2014          6 TAB 06/25/13          5 Term 08/01/12 751w6d 05:58 / 00:15 6 lb 12.4 oz (3.073 kg) F Vag-Spont EPI  LIV  4 Term 04/12/11 252w5d 12:00 / 04:38 8 lb 2.3 oz (3.695 kg) M Vag-Vacuum EPI  LIV     Birth  Comments: caput  3 TAB 01/30/06      None  DEC     Birth Comments: Pt reports she had a TAB at 24 weeks in [place].  Denies any fetal anomalies.    2 Term 04/22/04 3568w5d  6 lb 14 oz (3.118 kg) M Vag-Spont EPI N LIV  1 SAB               Past Surgical History: Past Surgical History:  Procedure Laterality Date  . INDUCED ABORTION    . MOUTH SURGERY     as a child  . WISDOM TOOTH EXTRACTION       Family History: Family History  Problem Relation Age of Onset  . Hypertension Mother   . Cancer Mother   . Fibroids Mother   . ADD / ADHD Brother   . Hypertension Maternal Grandmother   . Anesthesia problems Neg Hx     Social History: Social History  Substance Use Topics  . Smoking status: Former Smoker    Types: Cigarettes    Quit date: 04/04/2010  . Smokeless tobacco: Former NeurosurgeonUser  . Alcohol use No     Comment: quit when found out was pregnant    Allergies:  Allergies  Allergen Reactions  . Penicillins Anaphylaxis    Has patient had a PCN reaction causing immediate rash, facial/tongue/throat swelling, SOB or lightheadedness with hypotension: Yes Has patient had a PCN reaction causing severe rash involving mucus membranes or skin necrosis: Yes Has patient had a PCN reaction that required hospitalization Yes Has patient had a PCN reaction occurring within the last 10 years: No If all of the above answers are "NO", then may proceed with Cephalosporin use.  . Latex Itching and Rash    Meds:  Prescriptions Prior to Admission  Medication Sig Dispense Refill Last Dose  . acetaminophen (TYLENOL) 500 MG tablet Take 500 mg by mouth every 6 (six) hours as needed for mild pain.   01/11/2016 at Unknown time  . ibuprofen (ADVIL,MOTRIN) 800 MG tablet Take 800 mg by mouth every 8 (eight) hours as needed.   Past Week at Unknown time  . cetirizine (ZYRTEC) 10 MG tablet Take 1 tablet (10 mg total) by mouth daily. 14 tablet 1   . docusate sodium (COLACE) 100 MG capsule Take 1 capsule (100  mg total) by mouth 2 (two) times daily. 30 capsule 0   . famotidine (PEPCID) 20 MG tablet Take 1 tablet (20 mg total) by mouth daily. (Patient not taking: Reported on 10/18/2015) 30 tablet 1 Not Taking at Unknown time  . glycopyrrolate (ROBINUL) 2 MG tablet Take 1 tablet (2 mg total) by mouth 3 (three) times daily. (Patient not taking: Reported on 10/18/2015) 30 tablet 3 Not Taking at Unknown time  . metroNIDAZOLE (FLAGYL) 500 MG tablet Take 1 tablet (500 mg total) by mouth 2 (two) times daily. 14 tablet 0   . nitrofurantoin, macrocrystal-monohydrate, (MACROBID) 100 MG capsule Take 1 capsule (100 mg total) by mouth 2 (two) times daily. 10 capsule 0   . polyethylene glycol powder (GLYCOLAX/MIRALAX) powder Take 17 g by mouth 2 (two) times daily as needed. 3350 g 1   . Prenatal Vit-Fe Fumarate-FA (MULTIVITAMIN-PRENATAL) 27-0.8 MG TABS tablet Take 1 tablet by mouth daily at 12 noon. 30 each 11   . promethazine (PHENERGAN) 25 MG tablet Take 1 tablet (25 mg total) by mouth every 6 (six) hours as needed for nausea or vomiting. (Patient not taking: Reported on 10/18/2015) 30 tablet 1 Not Taking at Unknown time    I have reviewed patient's Past Medical Hx, Surgical Hx, Family Hx, Social Hx, medications and allergies.   ROS:  A comprehensive ROS was negative except per HPI.    Physical Exam  Patient Vitals for the past 24 hrs:  BP Temp Pulse Resp Height Weight  01/12/16 0216 109/68 98.1 F (36.7 C) 87 18 5\' 2"  (1.575 m) 135 lb 3.2 oz (61.3 kg)   Constitutional: Well-developed, well-nourished female in no acute distress.  Cardiovascular: normal rate Respiratory: normal effort GI: Abd soft, non-tender, gravid appropriate for gestational age. Pos BS x 4 MS: Extremities nontender, no edema, normal ROM Neurologic: Alert and oriented x 4.  GU: Neg CVAT. Pelvic: NEFG, large amounts of white frothy discharge minimally odorous, NO BLOOD in the vagina or from cervix, cervix clean. No CMT. SVE: Closed, thick,  long, high.  FHT:  Doppler 138   Labs: No results found for this or any previous visit (from the past 24 hour(s)).  Imaging:  [place] Mfm Ob Comp + 14 Wk  Result Date: 01/02/2016 OBSTETRICAL ULTRASOUND: This exam was performed within a [facility]. The OB US report was generated in the [facility] system, and faxed to the  ordering physician.  This report is available in the [employer]. See the [facility] Obstetric US report via the Image Link.   MAU Course: SSE: no bleeding, visually closed cervix, +discharge SVE: closed, thick, high Wet mount GC/CT UA - dirty catch x2, but blood seen, and symptoms  MDM: Plan of care reviewed with patient, including labs and tests ordered and medical treatment. Reviewed patient's chart and recent ultrasound, baby is measuring smaller than dates by about 2 weeks (dated by LMP c/w 6 wk and 10 wk [place]). Has appointment on 01/18/15 with MFM for genetic counseling, TORCH labs, NIPS and repeat US. Discussed with patient importance of following up with [facility] office, gave her phone number and location of [facility], as well as sent message to staff. Today, no signs of active bleeding or remnants of any recent bleeding, and no signs of labor with cervix closed and thick and high, as well as fetal heart tones heard on Doppler. Suspicious of UTI, will treat despite dirty catch, has had UTI frequently.    Assessment: 1. Vaginal bleeding in pregnancy, second trimester   2. Vaginal discharge during pregnancy in second trimester     Plan: Discharge home in stable condition.  Preterm labor precautions  Bleeding precautions    Allergies as of 01/12/2016      Reactions   Penicillins Anaphylaxis   Has patient had a PCN reaction causing immediate rash, facial/tongue/throat swelling, SOB or lightheadedness with hypotension: Yes Has patient had a PCN reaction causing severe rash involving mucus membranes or skin necrosis: Yes Has patient had a PCN reaction  that required hospitalization Yes Has patient had a PCN reaction occurring within the last 10 years: No If all of the above answers are "NO", then may proceed with Cephalosporin use.   Latex Itching, Rash      Medication List    TAKE these medications   acetaminophen 500 MG tablet Commonly known as:  TYLENOL Take 500 mg by mouth every 6 (six) hours as needed for mild pain.   cetirizine 10 MG tablet Commonly known as:  ZYRTEC Take 1 tablet (10 mg total) by mouth daily.   docusate sodium 100 MG capsule Commonly known as:  COLACE Take 1 capsule (100 mg total) by mouth 2 (two) times daily.   famotidine 20 MG tablet Commonly known as:  PEPCID Take 1 tablet (20 mg total) by mouth daily.   glycopyrrolate 2 MG tablet Commonly known as:  ROBINUL Take 1 tablet (2 mg total) by mouth 3 (three) times daily.   ibuprofen 800 MG tablet Commonly known as:  ADVIL,MOTRIN Take 800 mg by mouth every 8 (eight) hours as needed.   metroNIDAZOLE 500 MG tablet Commonly known as:  FLAGYL Take 1 tablet (500 mg total) by mouth 2 (two) times daily.   multivitamin-prenatal 27-0.8 MG Tabs tablet Take 1 tablet by mouth daily at 12 noon.   nitrofurantoin (macrocrystal-monohydrate) 100 MG capsule Commonly known as:  MACROBID Take 1 capsule (100 mg total) by mouth 2 (two) times daily. What changed:  Another medication with the same name was added. Make sure you understand how and when to take each.   nitrofurantoin (macrocrystal-monohydrate) 100 MG capsule Commonly known as:  MACROBID Take 1 capsule (100 mg total) by mouth 2 (two) times daily. What changed:  You were already taking a medication with the same name, and this prescription was added. Make sure you understand how and when to take each.   polyethylene glycol powder  powder Commonly known as:  GLYCOLAX/MIRALAX Take 17 g by mouth 2 (two) times daily as needed.   promethazine 25 MG tablet Commonly known as:  PHENERGAN Take 1 tablet (25 mg  total) by mouth every 6 (six) hours as needed for nausea or vomiting.       Jen MowElizabeth Marquis Diles, DO OB Fellow [facility] 01/12/2016 3:04 AM

## 2016-01-12 NOTE — Telephone Encounter (Signed)
Received call from patient on after hours line. Stated that she stated having vaginal bleeding and abdominal pain over the past day. Bleeding described as spotting. She is still having fetal movements. I advised patient to go to the MAU for further evaluation. Patient voiced understanding and had no further questions.  Katina Degreealeb M. Jimmey RalphParker, MD [facility] Resident PGY-3 01/12/2016 1:13 AM

## 2016-01-14 NOTE — L&D Delivery Note (Signed)
31 y.o. [id number] at [redacted]w[redacted]d delivered a vigorously crying viable SGA female infant in cephalic, ROA position. No nuchal cord. Left anterior shoulder delivered with ease. 60 sec delayed cord clamping. Cord clamped x2 and cut. Placenta delivered spontaneously intact, with 3VC. Fundus firm on exam with massage and pitocin. Good hemostasis noted.  Anesthesia: Epidural Laceration: NONE Suture: N/A Good hemostasis noted. EBL: 100cc  Mom and baby recovering in LDR.    Apgars: APGAR (1 MIN):   8 APGAR (5 MINS):   9 Weight: 2175g, 4#12oz  Placenta sent to pathology for IUGR.  Enzo Montgomery, DO PGY-3 Resident FM   OB FELLOW DELIVERY ATTESTATION  I was gloved and present for the delivery in its entirety, and I agree with the above resident's note.    Jen Mow, DO OB Fellow 6:16 PM

## 2016-01-15 LAB — GC/CHLAMYDIA PROBE AMP (~~LOC~~) NOT AT ARMC
Chlamydia: NEGATIVE
Neisseria Gonorrhea: NEGATIVE

## 2016-01-15 LAB — CULTURE, OB URINE: Culture: >=100000 — AB

## 2016-01-16 ENCOUNTER — Encounter: Payer: Self-pay | Admitting: Family Medicine

## 2016-01-16 DIAGNOSIS — O99820 Streptococcus B carrier state complicating pregnancy: Secondary | ICD-10-CM | POA: Insufficient documentation

## 2016-01-17 ENCOUNTER — Telehealth: Payer: Self-pay | Admitting: *Deleted

## 2016-01-17 NOTE — Telephone Encounter (Signed)
Patient called in asking what to do about son's ear pain. This was discussed in detail. Mom stated understanding then passed out while speaking with me. Patient's sister came to phone. Instructed to take patient to MAU or call EMS as patient is pregnant. Preceptor, Dr. Leveda AnnaHensel, made aware.  Kinnie FeilL. Carin Shipp, RN, BSN

## 2016-01-18 ENCOUNTER — Telehealth: Payer: Self-pay

## 2016-01-18 ENCOUNTER — Encounter (HOSPITAL_COMMUNITY): Payer: Self-pay

## 2016-01-18 ENCOUNTER — Ambulatory Visit (HOSPITAL_COMMUNITY)
Admission: RE | Admit: 2016-01-18 | Discharge: 2016-01-18 | Disposition: A | Discharge status: Home / Self Care | Payer: Medicaid Other | Source: Ambulatory Visit | Attending: Family Medicine | Admitting: Family Medicine

## 2016-01-18 ENCOUNTER — Other Ambulatory Visit (HOSPITAL_COMMUNITY): Payer: Self-pay | Admitting: *Deleted

## 2016-01-18 ENCOUNTER — Inpatient Hospital Stay (HOSPITAL_COMMUNITY): Payer: Medicaid Other

## 2016-01-18 ENCOUNTER — Inpatient Hospital Stay (HOSPITAL_COMMUNITY)
Admission: AD | Admit: 2016-01-18 | Discharge: 2016-01-18 | Disposition: A | Discharge status: Home / Self Care | Payer: Medicaid Other | Source: Ambulatory Visit | Attending: Obstetrics & Gynecology | Admitting: Obstetrics & Gynecology

## 2016-01-18 ENCOUNTER — Encounter (HOSPITAL_COMMUNITY): Payer: Self-pay | Admitting: *Deleted

## 2016-01-18 DIAGNOSIS — Z87891 Personal history of nicotine dependence: Secondary | ICD-10-CM | POA: Diagnosis not present

## 2016-01-18 DIAGNOSIS — O99322 Drug use complicating pregnancy, second trimester: Secondary | ICD-10-CM | POA: Diagnosis not present

## 2016-01-18 DIAGNOSIS — Z315 Encounter for genetic counseling: Secondary | ICD-10-CM | POA: Diagnosis present

## 2016-01-18 DIAGNOSIS — O365920 Maternal care for other known or suspected poor fetal growth, second trimester, not applicable or unspecified: Secondary | ICD-10-CM | POA: Diagnosis not present

## 2016-01-18 DIAGNOSIS — O26893 Other specified pregnancy related conditions, third trimester: Secondary | ICD-10-CM | POA: Diagnosis not present

## 2016-01-18 DIAGNOSIS — R55 Syncope and collapse: Secondary | ICD-10-CM | POA: Insufficient documentation

## 2016-01-18 DIAGNOSIS — K047 Periapical abscess without sinus: Secondary | ICD-10-CM | POA: Diagnosis not present

## 2016-01-18 DIAGNOSIS — Z3A23 23 weeks gestation of pregnancy: Secondary | ICD-10-CM | POA: Insufficient documentation

## 2016-01-18 DIAGNOSIS — O365990 Maternal care for other known or suspected poor fetal growth, unspecified trimester, not applicable or unspecified: Secondary | ICD-10-CM

## 2016-01-18 DIAGNOSIS — W19XXXA Unspecified fall, initial encounter: Secondary | ICD-10-CM

## 2016-01-18 DIAGNOSIS — Z880 Allergy status to penicillin: Secondary | ICD-10-CM | POA: Diagnosis not present

## 2016-01-18 DIAGNOSIS — R4020 Unspecified coma: Secondary | ICD-10-CM

## 2016-01-18 LAB — URINALYSIS, ROUTINE W REFLEX MICROSCOPIC
Bilirubin Urine: NEGATIVE
Glucose, UA: NEGATIVE mg/dL
Hgb urine dipstick: NEGATIVE
Ketones, ur: NEGATIVE mg/dL
Nitrite: NEGATIVE
Protein, ur: NEGATIVE mg/dL
Specific Gravity, Urine: 1.021 (ref 1.005–1.030)
pH: 6 (ref 5.0–8.0)

## 2016-01-18 LAB — RAPID URINE DRUG SCREEN, HOSP PERFORMED
Amphetamines: NOT DETECTED
Barbiturates: NOT DETECTED
Benzodiazepines: NOT DETECTED
Cocaine: NOT DETECTED
Opiates: NOT DETECTED
Tetrahydrocannabinol: POSITIVE — AB

## 2016-01-18 LAB — BASIC METABOLIC PANEL
Anion gap: 6 (ref 5–15)
BUN: 7 mg/dL (ref 6–20)
CO2: 23 mmol/L (ref 22–32)
Calcium: 8.7 mg/dL — ABNORMAL LOW (ref 8.9–10.3)
Chloride: 103 mmol/L (ref 101–111)
Creatinine, Ser: 0.58 mg/dL (ref 0.44–1.00)
GFR calc Af Amer: >60 mL/min (ref >60)
GFR calc non Af Amer: >60 mL/min (ref >60)
Glucose, Bld: 98 mg/dL (ref 65–99)
Potassium: 3.6 mmol/L (ref 3.5–5.1)
Sodium: 132 mmol/L — ABNORMAL LOW (ref 135–145)

## 2016-01-18 LAB — CBC
HCT: 34 % — ABNORMAL LOW (ref 36.0–46.0)
Hemoglobin: 11.7 g/dL — ABNORMAL LOW (ref 12.0–15.0)
MCH: 29.9 pg (ref 26.0–34.0)
MCHC: 34.4 g/dL (ref 30.0–36.0)
MCV: 87 fL (ref 78.0–100.0)
Platelets: 245 10*3/uL (ref 150–400)
RBC: 3.91 MIL/uL (ref 3.87–5.11)
RDW: 13.5 % (ref 11.5–15.5)
WBC: 8.9 10*3/uL (ref 4.0–10.5)

## 2016-01-18 LAB — MAGNESIUM: Magnesium: 2.4 mg/dL (ref 1.7–2.4)

## 2016-01-18 MED ORDER — SODIUM CHLORIDE 0.9 % IV BOLUS (SEPSIS): 1000.0000 mL | Freq: Once | INTRAVENOUS | Status: DC

## 2016-01-18 MED ORDER — ACETAMINOPHEN 325 MG PO TABS
650.0000 mg | ORAL_TABLET | Freq: Four times a day (QID) | ORAL | Status: DC | PRN
  Administered 2016-01-18: 650 mg via ORAL
  Filled 2016-01-18: qty 2

## 2016-01-18 NOTE — Telephone Encounter (Signed)
Per Dr.Mumaw, pt needs to start prenatal care in [facility] and to verify if pt has taken Macrobid for UTI.  Contacted the pt and pt informed me that she has not taken medication for the UTI.  I advised pt that she can go to her pharmacy, [facility] off [address], to pick up medication.  Pt stated understanding.  Pt then informs me that she hit her head on the floor last night, has a big knot on her head, and she is not feeling well.  I strongly advised pt to please go to MAU for evaluation and to please not drive herself.  Pt stated that she will find a ride and did not have any other questions.

## 2016-01-18 NOTE — MAU Provider Note (Signed)
Chief Complaint:  Loss of Consciousness   First Provider Initiated Contact with Patient 01/18/16 [phone]      HPI: Rachel Lloyd is a 31 y.o. [id number] at [redacted]w[redacted]d who presents to MAU with left sided temporal pain after a reported fall due to syncopal episode yesterday. She reported feelings of dizziness and palpitations and then stood up and the next thing she knew she was in bed and was told that she was unconscious for about an hour. She denied any CP at that time and has no history of seizures. She did not lose control of her bowels or bladder and did not bite her tongue. She denies drug use. She did have decreased PO intake over the last couple of days and has been urinating appropriately.  She does endorse some SOB and apparently started smoking 1 week ago and is not a previous smoker. Reports that similar episodes occurred during prior pregnancies. No seizure history. No cardiac history. 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 States has been very stressed over this.   Additionally, she has left facial pain and notes that she has been told to have bottom left molar removed a few years ago.  She has no signs or symptoms of infection. Denies fevers, chills or body aches.   Did not receive a flu-shot this year.     Denies contractions, leakage of fluid or vaginal bleeding. Good fetal movement.   Pregnancy Course:   Past Medical History: Past Medical History:  Diagnosis Date  . Chlamydia   . Hypertension    During second pregnancy  . Mental disorder 2010   bipolar  . Pneumonia   . Trichimoniasis     Past obstetric history: OB History  Gravida Para Term Preterm AB Living  8 3 3  0 4 3  SAB TAB Ectopic Multiple Live Births  1 3 0 0 3    # Outcome Date GA Lbr Len/2nd Weight Sex Delivery Anes PTL Lv  8 Current           7 TAB 05/2014          6 TAB  06/25/13          5 Term 08/01/12 [redacted]w[redacted]d 05:58 / 00:15 3.073 kg (6 lb 12.4 oz) F Vag-Spont EPI  LIV  4 Term 04/12/11 [redacted]w[redacted]d 12:00 / 04:38 3.695 kg (8 lb 2.3 oz) M Vag-Vacuum EPI  LIV     Birth Comments: caput  3 TAB 01/30/06      None  DEC     Birth Comments: Pt reports she had a TAB at 24 weeks in [place].  Denies any fetal anomalies.    2 Term 04/22/04 [redacted]w[redacted]d  3.118 kg (6 lb 14 oz) M Vag-Spont EPI N LIV  1 SAB               Past Surgical History: Past Surgical History:  Procedure Laterality Date  . INDUCED ABORTION    . MOUTH SURGERY     as a child  . WISDOM TOOTH EXTRACTION       Family History: Family History  Problem Relation Age of Onset  . Hypertension Mother   . Cancer Mother   . Fibroids Mother   . ADD / ADHD Brother   . Hypertension Maternal Grandmother   . Anesthesia problems Neg Hx  Social History: Social History  Substance Use Topics  . Smoking status: Former Smoker    Types: Cigarettes    Quit date: 04/04/2010  . Smokeless tobacco: Former Neurosurgeon  . Alcohol use No     Comment: quit when found out was pregnant    Allergies:  Allergies  Allergen Reactions  . Penicillins Anaphylaxis    Has patient had a PCN reaction causing immediate rash, facial/tongue/throat swelling, SOB or lightheadedness with hypotension: Yes Has patient had a PCN reaction causing severe rash involving mucus membranes or skin necrosis: Yes Has patient had a PCN reaction that required hospitalization Yes Has patient had a PCN reaction occurring within the last 10 years: No If all of the above answers are "NO", then may proceed with Cephalosporin use.  . Latex Itching and Rash    Meds:  Prescriptions Prior to Admission  Medication Sig Dispense Refill Last Dose  . cetirizine (ZYRTEC) 10 MG tablet Take 1 tablet (10 mg total) by mouth daily. (Patient not taking: Reported on 01/18/2016) 14 tablet 1 Not Taking at Unknown time  . docusate sodium (COLACE) 100 MG capsule Take 1 capsule  (100 mg total) by mouth 2 (two) times daily. (Patient not taking: Reported on 01/18/2016) 30 capsule 0 Not Taking at Unknown time  . famotidine (PEPCID) 20 MG tablet Take 1 tablet (20 mg total) by mouth daily. (Patient not taking: Reported on 01/18/2016) 30 tablet 1 Not Taking at Unknown time  . glycopyrrolate (ROBINUL) 2 MG tablet Take 1 tablet (2 mg total) by mouth 3 (three) times daily. (Patient not taking: Reported on 01/18/2016) 30 tablet 3 Not Taking at Unknown time  . metroNIDAZOLE (FLAGYL) 500 MG tablet Take 1 tablet (500 mg total) by mouth 2 (two) times daily. (Patient not taking: Reported on 01/18/2016) 14 tablet 0 Completed Course at Unknown time  . nitrofurantoin, macrocrystal-monohydrate, (MACROBID) 100 MG capsule Take 1 capsule (100 mg total) by mouth 2 (two) times daily. (Patient not taking: Reported on 01/18/2016) 10 capsule 0 Not Taking at Unknown time  . nitrofurantoin, macrocrystal-monohydrate, (MACROBID) 100 MG capsule Take 1 capsule (100 mg total) by mouth 2 (two) times daily. (Patient not taking: Reported on 01/18/2016) 14 capsule 0 Not Taking at Unknown time  . polyethylene glycol powder (GLYCOLAX/MIRALAX) powder Take 17 g by mouth 2 (two) times daily as needed. (Patient not taking: Reported on 01/18/2016) 3350 g 1 Not Taking at Unknown time  . Prenatal Vit-Fe Fumarate-FA (MULTIVITAMIN-PRENATAL) 27-0.8 MG TABS tablet Take 1 tablet by mouth daily at 12 noon. (Patient not taking: Reported on 01/18/2016) 30 each 11 Not Taking at Unknown time  . promethazine (PHENERGAN) 25 MG tablet Take 1 tablet (25 mg total) by mouth every 6 (six) hours as needed for nausea or vomiting. (Patient not taking: Reported on 01/18/2016) 30 tablet 1 Not Taking at Unknown time    I have reviewed patient's Past Medical Hx, Surgical Hx, Family Hx, Social Hx, medications and allergies.   ROS:  A comprehensive ROS was negative except per HPI.    Physical Exam   Patient Vitals for the past 24 hrs:  BP Temp Pulse Resp SpO2   01/18/16 0921 105/61 99 F (37.2 C) 91 18 99 %   Constitutional: Well-developed, well-nourished female in no acute distress.  HEENT: no abrasions or ecchymoses appreciated on head, does endorse mild tenderness at left temple, EOMI, PERRL, MMM, oropharynx clear, poor dentition with multiple infected teeth with bottom left molar showing purulent drainage Cardiovascular: RRR, no MRG  Respiratory: NWOB, CTABL, no wheezing or rhonchi GI: Abd soft, non-tender, gravid appropriate for gestational age.  MS: Extremities nontender, no edema, normal ROM Neurologic: CNII-X WNL, sensation intact, upper and lower extremities 5/5 strength GU: deferred Psych: normal mood and affect     FHT:  Difficult to pickup on monitor Contractions: none   Labs: Results for orders placed or performed during the hospital encounter of 01/18/16 (from the past 24 hour(s))  Urinalysis, Routine w reflex microscopic     Status: Abnormal   Collection Time: 01/18/16  9:25 AM  Result Value Ref Range   Color, Urine YELLOW YELLOW   APPearance CLOUDY (A) CLEAR   Specific Gravity, Urine 1.021 1.005 - 1.030   pH 6.0 5.0 - 8.0   Glucose, UA NEGATIVE NEGATIVE mg/dL   Hgb urine dipstick NEGATIVE NEGATIVE   Bilirubin Urine NEGATIVE NEGATIVE   Ketones, ur NEGATIVE NEGATIVE mg/dL   Protein, ur NEGATIVE NEGATIVE mg/dL   Nitrite NEGATIVE NEGATIVE   Leukocytes, UA LARGE (A) NEGATIVE   RBC / HPF 0-5 0 - 5 RBC/hpf   WBC, UA 6-30 0 - 5 WBC/hpf   Bacteria, UA RARE (A) NONE SEEN   Squamous Epithelial / LPF 6-30 (A) NONE SEEN   Mucous PRESENT   Basic metabolic panel     Status: Abnormal   Collection Time: 01/18/16 10:15 AM  Result Value Ref Range   Sodium 132 (L) 135 - 145 mmol/L   Potassium 3.6 3.5 - 5.1 mmol/L   Chloride 103 101 - 111 mmol/L   CO2 23 22 - 32 mmol/L   Glucose, Bld 98 65 - 99 mg/dL   BUN 7 6 - 20 mg/dL   Creatinine, Ser 0.450.58 0.44 - 1.00 mg/dL   Calcium 8.7 (L) 8.9 - 10.3 mg/dL   GFR calc non Af Amer >60 >60  mL/min   GFR calc Af Amer >60 >60 mL/min   Anion gap 6 5 - 15  CBC     Status: Abnormal   Collection Time: 01/18/16 10:15 AM  Result Value Ref Range   WBC 8.9 4.0 - 10.5 K/uL   RBC 3.91 3.87 - 5.11 MIL/uL   Hemoglobin 11.7 (L) 12.0 - 15.0 g/dL   HCT 40.934.0 (L) 81.136.0 - 91.446.0 %   MCV 87.0 78.0 - 100.0 fL   MCH 29.9 26.0 - 34.0 pg   MCHC 34.4 30.0 - 36.0 g/dL   RDW 78.213.5 95.611.5 - 21.315.5 %   Platelets 245 150 - 400 K/uL  Magnesium     Status: None   Collection Time: 01/18/16 10:15 AM  Result Value Ref Range   Magnesium 2.4 1.7 - 2.4 mg/dL    Imaging:  [place] Mfm Ob Comp + 14 Wk  Result Date: 01/02/2016 OBSTETRICAL ULTRASOUND: This exam was performed within a [facility]. The OB US report was generated in the AS system, and faxed to the ordering physician.  This report is available in the [facility] [employer] PACS. See the AS Obstetric US report via the Image Link.   MAU Course: BMP/CBC/mg2+/Ca2+: workup for seizure activity CT head: d/t loss of consciousness >1hr: pending UA: not clear catch, spec grav normal UDS: pending Pain control: w/ tylenol    MDM: Plan of care reviewed with patient, including labs and tests ordered and medical treatment.   Assessment: Single Intrauterine Pregnancy at 2242w1d  1. Loss of consciousness (HCC)   Differential includes vasovovagal syncope due to decreased PO intake, however UA showed normal spec grav and BMP  showed only mild hyponatremia and patient appears euvolemic on exam.  Seizure activity is possible given reported post-ictal state, but she denied any loss of bladder/bowel control or biting her tongue and electrolytes were normal.  She does have significant history of mental illness including bipolar, MDD and has had recent deaths in the family.  Neuro exam WNL.  - f/u CT HEAD non-contrast - f/u UDS - orthostatic vitals - continue to monitor - fluid bolus - tylenol PRN  Dental infections:  Left facial pain and poor dentition with  multiple infected teeth. Bottom left molar showing purluent drainage. Will definitely need extraction and possibly others as well.  No fevers, chills or leukocytosis. No signs of infection in gums.  - tylenol PRN - consider course of clindamycin - outpatient dental referral.   Plan:  Continue to monitor. See above for plan for individual problems. Transferring care to Wynelle Bourgeois, CNM.      Renne Musca, MD PGY-1 01/18/2016 11:14 AM  Assumed care Vital signs are stable Fetal heart not tracing due to very early gestational age,but reassuring No contractions No bleeding. Tylenol given for headache per request. Head CT ordered Ct Head Wo Contrast  Result Date: 01/18/2016 CLINICAL DATA:  Fall striking the right side of the head yesterday. Headache. Second trimester pregnancy, abdomen shielded. EXAM: CT HEAD WITHOUT CONTRAST TECHNIQUE: Contiguous axial images were obtained from the base of the skull through the vertex without intravenous contrast. COMPARISON:  01/16/2014 FINDINGS: Brain: The brainstem, cerebellum, cerebral peduncles, thalami, basal ganglia, basilar cisterns, and ventricular system appear within normal limits. No intracranial hemorrhage, mass lesion, or acute CVA. Vascular: Unremarkable Skull: Unremarkable Sinuses/Orbits: Unremarkable Other: No supplemental non-categorized findings. IMPRESSION: 1.  No significant abnormality identified. Electronically Signed   By: Gaylyn Rong M.D.   On: 01/18/2016 11:41   [place] Mfm Ob Comp + 14 Wk  Result Date: 01/02/2016 OBSTETRICAL ULTRASOUND: This exam was performed within a [facility]. The OB US report was generated in the AS system, and faxed to the ordering physician.  This report is available in the [employer] [employer]. See the AS Obstetric US report via the Image Link.  [place] Mfm Ob Follow Up  Result Date: 01/18/2016 OBSTETRICAL ULTRASOUND: This exam was performed within a [facility]. The OB  US report was generated in the AS system, and faxed to the ordering physician.  This report is available in the [employer] [employer]. See the AS Obstetric US report via the Image Link.  Head CT negative No bleeding.  Will discharge home Recommend reconsult with her neurologist this week Follow up in clinic for eval of further symptoms.  Aviva Signs, CNM

## 2016-01-18 NOTE — Discharge Instructions (Signed)
Near-Syncope °Introduction °Near-syncope is when you suddenly get weak or dizzy, or you feel like you might pass out (faint). During an episode of near-syncope, you may: °· Feel dizzy or light-headed. °· Feel sick to your stomach (nauseous). °· See all white or all black. °· Have cold, clammy skin. °If you passed out, get help right away.Call your local emergency services (911 in the [place] Do not drive yourself to the hospital. °Follow these instructions at home: °Pay attention to any changes in your symptoms. Take these actions to help with your condition: °· Have someone stay with you until you feel stable. °· Do not drive, use machinery, or play sports until your doctor says it is okay. °· Keep all follow-up visits as told by your doctor. This is important. °· If you start to feel like you might pass out, lie down right away and raise (elevate) your feet above the level of your heart. Breathe deeply and steadily. Wait until all of the symptoms are gone. °· Drink enough fluid to keep your pee (urine) clear or pale yellow. °· If you are taking blood pressure or heart medicine, get up slowly and spend many minutes getting ready to sit and then stand. This can help with dizziness. °· Take over-the-counter and prescription medicines only as told by your doctor. °Get help right away if: °· You have a very bad headache. °· You have unusual pain in your chest, tummy, or back. °· You are bleeding from your mouth or rectum. °· You have black or tarry poop (stool). °· You have a very fast or uneven heartbeat (palpitations). °· You pass out one time or more than once. °· You have jerky movements that you cannot control (seizure). °· You are confused. °· You have trouble walking. °· You are very weak. °· You have vision problems. °These symptoms may be an emergency. Do not wait to see if the symptoms will go away. Get medical help right away. Call your local emergency services (911 in the [place] Do not drive yourself to the  hospital.  °This information is not intended to replace advice given to you by your health care provider. Make sure you discuss any questions you have with your health care provider. °Document Released: 06/18/2007 Document Revised: 06/07/2015 Document Reviewed: 09/13/2014 °© 2017 Elsevier ° °

## 2016-01-18 NOTE — MAU Note (Addendum)
Pt states she passed out yesterday, was on the phone trying to make a dr appointment for her son around [id number].  Pt felt like she was choking, became very hot & passed out, was witnessed by a friend.  Pt states she fell & hit her head on the table & then on the floor. Pt's friend said she was passed out for approximately an hour.  Pt states she passed out again last night.  C/O knot on L side of head, knot not clearly visible.  Has HA L side of head, feels confused & sleepy this morning, hasn't eaten or drank anything.  C/O toothache on L side for the last week, is much worse since her fall.  Denies any contractions, bleeding or LOF.

## 2016-01-18 NOTE — Telephone Encounter (Signed)
Noted and agree. 

## 2016-01-18 NOTE — Progress Notes (Signed)
Genetic Counseling  High-Risk Gestation Note  Appointment Date:  01/18/2016 Referred By: Leeanne Rio, MD Date of Birth:  1985/02/18   Pregnancy History: [id number] Estimated Date of Delivery: 05/15/16 Estimated Gestational Age: 43w1dAttending: MRenella Cunas MD   I met with Ms. AAlcus Dadfor genetic counseling because of abnormal ultrasound findings.   In summary:  Discussed ultrasound findings in detail  Reviewed options for additional screening  NIPS- declined  TORCH titers- declined today  Ongoing ultrasound- follow- up scheduled for 02/01/16  Reviewed options for diagnostic testing, including risks, benefits, limitations and alternatives  Reviewed other explanations for ultrasound findings  Reviewed family history concerns  We began by reviewing the ultrasound in detail. Ms. WConnettwas previously seen for [facility], and fetal growth restriction was noted at that time. The baby measured 117w4dt 2061w1dstation. Follow-up ultrasound today also noted intrauterine growth restriction, with baby measuring 20w78w5d23w155w1dation. Complete ultrasound reports under separate cover. Visualized fetal anatomy was within normal limits.  The long bones do not have evidence of fractures or bowing, and the bone echogenicity is also normal.  The pregnancy is dated by early ultrasound.  The organ systems appear normally formed, with no evidence of fetal anomalies or soft markers.  There is considerable variability in the definition of what constitutes fetal growth restriction; however, the most commonly accepted definition is fetal/birth weight below the 10th percentile for gestational age.  We discussed that approximately 3-10% of all pregnancies have IUGR.  IUGR can result from a variety of causes, including chronic uteroplacental insufficiency, environmental exposures (drugs, alcohol, and other teratogens), congenital infections, or  genetic etiologies.    A detailed medical history was obtained;  Ms. AshleGERMAINE RIPPrted exposures early in the pregnancy given that she was not accepting of the pregnancy. 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 She reported that she has recently begun to take better care of herself in the pregnancy and denies any recent drug use. She reported that she also was not eating well earlier in the pregnancy and has recently improved this aspect as well. She reported drinking red wine two days ago. She reported smoking 1-2 cigarettes per day. We reviewed embryology, the all or none period, and the timing of organogenesis. cocaine crosses the placenta, enters fetal circulation, and is cleared more slowly from fetal circulation than adult circulation.  We discussed that literature varies as to whether cocaine use during pregnancy is associated with an increased risk for birth defects; however, there is literature that suggests that the risk for vascular disruptions, which may lead to heart, limb, intestinal, and genitourinary anomalies.  Growth delays can also be associated with prenatal cocaine use.  We discussed that tobacco use during pregnancy may increase the level of carbon monoxide levels in the blood, which can decrease the amount of oxygen the baby receives, and may affect the growth of the baby.   Prenatal alcohol exposure can increase the risk for growth delays, small head size, heart defects, eye and facial differences, as well as behavior problems and learning disabilities. The risk of these to occur tends to increase with the amount of alcohol consumed. However, because there is no identified safe amount of alcohol in pregnancy, it is recommended to completely avoid alcohol in pregnancy.   Ms. AshleELERI RUBEN not have a history of specific maternal illnesses and denies known exposure to infections.  Maternal serum TORCH titers were offered, and the  patient declined pursuing this at today's appointment.    We discussed that uteroplacental insufficiency or poor placentation is a common cause of IUGR.  When the growth restriction is due to nutritional compromise, the head growth tends to be spared, resulting in asymmetrical growth restriction.  Ultrasound showed that the fetal head circumference is also less than the 3rd percentile for gestational age.    Regarding genetic etiologies, we discussed the increased risk for specific chromosome conditions including fetal aneuploidy.  We reviewed chromosomes, nondisjunction, and the common features of trisomy 42 and triploidy.  In addition, there is a slight increase in risk for other chromosome aberrations including uniparental disomy (chromosomes 7 and 14), microdeletions, duplications, insertions, and translocations.  However, we reviewed that there were no additional markers of aneuploidy, and no structural abnormalities were visualized.     Ms. MALANI LEES was then counseled regarding the availability of amniocentesis including the associated risks, benefits, and limitations.  She understands that chromosome analysis can be performed both prenatally (amniotic fluid) and postnatally (peripheral blood), if indicated.  Additionally, we discussed the availability of microarray analysis, which can also be performed pre and postnatally.  She was counseled that microarray analysis is a molecular based technique in which a test sample of DNA (fetal) is compared to a reference (normal) genome in order to determine if the test sample has any extra or missing genetic information.  Microarray analysis allows for the detection of genetic deletions and duplications that are 762 times smaller than those identified by routine chromosome analysis.  We then discussed the option of noninvasive prenatal screening (NIPS).  We discussed that NIPS analyzes cell free fetal DNA found in the maternal circulation and provides a  pregnancy specific risk assessment for specific chromosome conditions, including: trisomy 62, trisomy 8, trisomy 13, triploidy, and sex chromosome aneuploidy.  This test is not diagnostic for chromosome conditions; however, the reported detection rate is greater than 99% for most conditions and the false positive rate is less than 0.5% for all of these conditions.  After thoughtful consideration of her options, Ms. Alcus Dad declined amniocentesis and NIPS, stating that information regarding the presence of an underlying chromosome condition would potentially increase her worry in pregnancy.    She was also counseled that IUGR can be a feature in a variety of other genetic conditions, including single gene conditions, but can also be a normal variant of growth or the result of familial short stature. We reviewed the limited availability of prenatal screening and testing for single gene conditions. There are no known individuals on either side of the family with short stature.  We discussed that the prognosis and postnatal management depend on the underlying etiology of the IUGR. Follow-up ultrasound was scheduled for 02/01/16 to monitor fetal growth. She understands that ultrasound cannot diagnose or rule out all birth defects or genetic conditions.   Both family histories were reviewed and found to be noncontributory for birth defects, intellectual disability, and known genetic conditions. Limited pedigree construction was performed today given the patient's time constraints from prior hospital admission today. Without further information regarding the provided family history, an accurate genetic risk cannot be calculated. Further genetic counseling is warranted if more information is obtained.  Ms. RENESSA WELLNITZ was provided with written information regarding cystic fibrosis (CF), spinal muscular atrophy (SMA) and hemoglobinopathies including the carrier frequency, availability of carrier screening and  prenatal diagnosis if indicated.  In  addition, we discussed that CF and hemoglobinopathies are routinely screened for as part of the [facility] newborn screening panel.  After further discussion, she declined screening for CF, SMA and hemoglobinopathies..   I counseled Ms. Alcus Dad regarding the above risks and available options.  The approximate face-to-face time with the genetic counselor was 25 minutes.  Chipper Oman, MS Certified Genetic Counselor 01/18/2016

## 2016-01-19 LAB — CALCIUM, URINE, RANDOM: Calcium, Ur: 4 mg/dL

## 2016-01-24 ENCOUNTER — Telehealth: Payer: Self-pay | Admitting: Family Medicine

## 2016-01-24 NOTE — Telephone Encounter (Signed)
The baby measured 329w4d at 8469w1d gestation. Follow-up ultrasound was faxed to our office and revealed the fetus grew 3128w1d in the last 1971w2d, however was still lagging behind 2-3 weeks. After genetic testing, it appears she declined TORCH titers and cell free DNA screening.   She has a [facility] OB appt scheduled for next week and a f/u U/S with MFM the subsequent week.  Joanna Puffrystal S. Dorsey, MD [facility] Resident  01/24/2016, 2:03 PM

## 2016-01-31 ENCOUNTER — Encounter: Payer: Self-pay | Admitting: Family

## 2016-02-01 ENCOUNTER — Ambulatory Visit (HOSPITAL_COMMUNITY)
Admission: RE | Admit: 2016-02-01 | Discharge: 2016-02-01 | Disposition: A | Discharge status: Home / Self Care | Payer: Medicaid Other | Source: Ambulatory Visit | Attending: Family Medicine | Admitting: Family Medicine

## 2016-02-01 ENCOUNTER — Encounter (HOSPITAL_COMMUNITY): Payer: Self-pay

## 2016-02-01 ENCOUNTER — Other Ambulatory Visit (HOSPITAL_COMMUNITY): Payer: Self-pay | Admitting: *Deleted

## 2016-02-01 DIAGNOSIS — O365920 Maternal care for other known or suspected poor fetal growth, second trimester, not applicable or unspecified: Secondary | ICD-10-CM | POA: Insufficient documentation

## 2016-02-01 DIAGNOSIS — O365990 Maternal care for other known or suspected poor fetal growth, unspecified trimester, not applicable or unspecified: Secondary | ICD-10-CM

## 2016-02-01 DIAGNOSIS — Z3A25 25 weeks gestation of pregnancy: Secondary | ICD-10-CM | POA: Insufficient documentation

## 2016-02-04 ENCOUNTER — Telehealth: Payer: Self-pay | Admitting: Family Medicine

## 2016-02-04 NOTE — Telephone Encounter (Deleted)
Received

## 2016-02-04 NOTE — Telephone Encounter (Signed)
Received fax from most recent US. It revealed SIUP at 25+1 with normal anatomy and amniotic fluid.  EFW at the 11%, AC <3%, fetus has grown 2+0 weeks over the past 2+0 weeks. UA dopplers were normal.  Recommended f/u US for growth in 2-3 weeks and initiate course of BMZ at next visit if still indicated.  Pt has HR OB appt on 1/29 and F/u U/S on 2/6.  Joanna Puffrystal S. Dorsey, MD [facility]  02/04/2016, 1:41 PM

## 2016-02-06 ENCOUNTER — Encounter: Payer: Self-pay | Admitting: Advanced Practice Midwife

## 2016-02-11 ENCOUNTER — Encounter: Payer: Self-pay | Admitting: Obstetrics and Gynecology

## 2016-02-18 ENCOUNTER — Encounter: Payer: Self-pay | Admitting: Obstetrics & Gynecology

## 2016-02-18 ENCOUNTER — Telehealth: Payer: Self-pay | Admitting: General Practice

## 2016-02-18 ENCOUNTER — Ambulatory Visit (INDEPENDENT_AMBULATORY_CARE_PROVIDER_SITE_OTHER): Payer: Medicaid Other | Admitting: Obstetrics & Gynecology

## 2016-02-18 VITALS — BP 102/71 | HR 88 | Wt 140.8 lb

## 2016-02-18 DIAGNOSIS — O365920 Maternal care for other known or suspected poor fetal growth, second trimester, not applicable or unspecified: Secondary | ICD-10-CM

## 2016-02-18 DIAGNOSIS — O0992 Supervision of high risk pregnancy, unspecified, second trimester: Secondary | ICD-10-CM

## 2016-02-18 DIAGNOSIS — O365990 Maternal care for other known or suspected poor fetal growth, unspecified trimester, not applicable or unspecified: Secondary | ICD-10-CM

## 2016-02-18 DIAGNOSIS — O0993 Supervision of high risk pregnancy, unspecified, third trimester: Secondary | ICD-10-CM | POA: Insufficient documentation

## 2016-02-18 MED ORDER — PANTOPRAZOLE SODIUM 40 MG PO TBEC: 40.0000 mg | DELAYED_RELEASE_TABLET | Freq: Every day | ORAL | 2 refills | Status: DC

## 2016-02-18 MED ORDER — COMPLETENATE 29-1 MG PO CHEW: 1.0000 | CHEWABLE_TABLET | Freq: Every day | ORAL | Status: DC

## 2016-02-18 NOTE — Patient Instructions (Signed)
Third Trimester of Pregnancy The third trimester is from week 29 through week 40 (months 7 through 9). The third trimester is a time when the unborn baby (fetus) is growing rapidly. At the end of the ninth month, the fetus is about 20 inches in length and weighs 6-10 pounds. Body changes during your third trimester Your body goes through many changes during pregnancy. The changes vary from woman to woman. During the third trimester:  Your weight will continue to increase. You can expect to gain 25-35 pounds (11-16 kg) by the end of the pregnancy.  You may begin to get stretch marks on your hips, abdomen, and breasts.  You may urinate more often because the fetus is moving lower into your pelvis and pressing on your bladder.  You may develop or continue to have heartburn. This is caused by increased hormones that slow down muscles in the digestive tract.  You may develop or continue to have constipation because increased hormones slow digestion and cause the muscles that push waste through your intestines to relax.  You may develop hemorrhoids. These are swollen veins (varicose veins) in the rectum that can itch or be painful.  You may develop swollen, bulging veins (varicose veins) in your legs.  You may have increased body aches in the pelvis, back, or thighs. This is due to weight gain and increased hormones that are relaxing your joints.  You may have changes in your hair. These can include thickening of your hair, rapid growth, and changes in texture. Some women also have hair loss during or after pregnancy, or hair that feels dry or thin. Your hair will most likely return to normal after your baby is born.  Your breasts will continue to grow and they will continue to become tender. A yellow fluid (colostrum) may leak from your breasts. This is the first milk you are producing for your baby.  Your belly button may stick out.  You may notice more swelling in your hands, face, or  ankles.  You may have increased tingling or numbness in your hands, arms, and legs. The skin on your belly may also feel numb.  You may feel short of breath because of your expanding uterus.  You may have more problems sleeping. This can be caused by the size of your belly, increased need to urinate, and an increase in your body's metabolism.  You may notice the fetus "dropping," or moving lower in your abdomen.  You may have increased vaginal discharge.  Your cervix becomes thin and soft (effaced) near your due date. What to expect at prenatal visits You will have prenatal exams every 2 weeks until week 36. Then you will have weekly prenatal exams. During a routine prenatal visit:  You will be weighed to make sure you and the fetus are growing normally.  Your blood pressure will be taken.  Your abdomen will be measured to track your baby's growth.  The fetal heartbeat will be listened to.  Any test results from the previous visit will be discussed.  You may have a cervical check near your due date to see if you have effaced. At around 36 weeks, your health care provider will check your cervix. At the same time, your health care provider will also perform a test on the secretions of the vaginal tissue. This test is to determine if a type of bacteria, Group B streptococcus, is present. Your health care provider will explain this further. Your health care provider may ask you:    What your birth plan is.  How you are feeling.  If you are feeling the baby move.  If you have had any abnormal symptoms, such as leaking fluid, bleeding, severe headaches, or abdominal cramping.  If you are using any tobacco products, including cigarettes, chewing tobacco, and electronic cigarettes.  If you have any questions. Other tests or screenings that may be performed during your third trimester include:  Blood tests that check for low iron levels (anemia).  Fetal testing to check the health,  activity level, and growth of the fetus. Testing is done if you have certain medical conditions or if there are problems during the pregnancy.  Nonstress test (NST). This test checks the health of your baby to make sure there are no signs of problems, such as the baby not getting enough oxygen. During this test, a belt is placed around your belly. The baby is made to move, and its heart rate is monitored during movement. What is false labor? False labor is a condition in which you feel small, irregular tightenings of the muscles in the womb (contractions) that eventually go away. These are called Braxton Hicks contractions. Contractions may last for hours, days, or even weeks before true labor sets in. If contractions come at regular intervals, become more frequent, increase in intensity, or become painful, you should see your health care provider. What are the signs of labor?  Abdominal cramps.  Regular contractions that start at 10 minutes apart and become stronger and more frequent with time.  Contractions that start on the top of the uterus and spread down to the lower abdomen and back.  Increased pelvic pressure and dull back pain.  A watery or bloody mucus discharge that comes from the vagina.  Leaking of amniotic fluid. This is also known as your "water breaking." It could be a slow trickle or a gush. Let your doctor know if it has a color or strange odor. If you have any of these signs, call your health care provider right away, even if it is before your due date. Follow these instructions at home: Eating and drinking  Continue to eat regular, healthy meals.  Do not eat:  Raw meat or meat spreads.  Unpasteurized milk or cheese.  Unpasteurized juice.  Store-made salad.  Refrigerated smoked seafood.  Hot dogs or deli meat, unless they are piping hot.  More than 6 ounces of albacore tuna a week.  Shark, swordfish, king mackerel, or tile fish.  Store-made salads.  Raw  sprouts, such as mung bean or alfalfa sprouts.  Take prenatal vitamins as told by your health care provider.  Take 1000 mg of calcium daily as told by your health care provider.  If you develop constipation:  Take over-the-counter or prescription medicines.  Drink enough fluid to keep your urine clear or pale yellow.  Eat foods that are high in fiber, such as fresh fruits and vegetables, whole grains, and beans.  Limit foods that are high in fat and processed sugars, such as fried and sweet foods. Activity  Exercise only as directed by your health care provider. Healthy pregnant women should aim for 2 hours and 30 minutes of moderate exercise per week. If you experience any pain or discomfort while exercising, stop.  Avoid heavy lifting.  Do not exercise in extreme heat or humidity, or at high altitudes.  Wear low-heel, comfortable shoes.  Practice good posture.  Do not travel far distances unless it is absolutely necessary and only with the approval   of your health care provider.  Wear your seat belt at all times while in a car, on a bus, or on a plane.  Take frequent breaks and rest with your legs elevated if you have leg cramps or low back pain.  Do not use hot tubs, steam rooms, or saunas.  You may continue to have sex unless your health care provider tells you otherwise. Lifestyle  Do not use any products that contain nicotine or tobacco, such as cigarettes and e-cigarettes. If you need help quitting, ask your health care provider.  Do not drink alcohol.  Do not use any medicinal herbs or unprescribed drugs. These chemicals affect the formation and growth of the baby.  If you develop varicose veins:  Wear support pantyhose or compression stockings as told by your healthcare provider.  Elevate your feet for 15 minutes, 3-4 times a day.  Wear a supportive maternity bra to help with breast tenderness. General instructions  Take over-the-counter and prescription  medicines only as told by your health care provider. There are medicines that are either safe or unsafe to take during pregnancy.  Take warm sitz baths to soothe any pain or discomfort caused by hemorrhoids. Use hemorrhoid cream or witch hazel if your health care provider approves.  Avoid cat litter boxes and soil used by cats. These carry germs that can cause birth defects in the baby. If you have a cat, ask someone to clean the litter box for you.  To prepare for the arrival of your baby:  Take prenatal classes to understand, practice, and ask questions about the labor and delivery.  Make a trial run to the hospital.  Visit the hospital and tour the maternity area.  Arrange for maternity or paternity leave through employers.  Arrange for family and friends to take care of pets while you are in the hospital.  Purchase a rear-facing car seat and make sure you know how to install it in your car.  Pack your hospital bag.  Prepare the baby's nursery. Make sure to remove all pillows and stuffed animals from the baby's crib to prevent suffocation.  Visit your dentist if you have not gone during your pregnancy. Use a soft toothbrush to brush your teeth and be gentle when you floss.  Keep all prenatal follow-up visits as told by your health care provider. This is important. Contact a health care provider if:  You are unsure if you are in labor or if your water has broken.  You become dizzy.  You have mild pelvic cramps, pelvic pressure, or nagging pain in your abdominal area.  You have lower back pain.  You have persistent nausea, vomiting, or diarrhea.  You have an unusual or bad smelling vaginal discharge.  You have pain when you urinate. Get help right away if:  You have a fever.  You are leaking fluid from your vagina.  You have spotting or bleeding from your vagina.  You have severe abdominal pain or cramping.  You have rapid weight loss or weight gain.  You have  shortness of breath with chest pain.  You notice sudden or extreme swelling of your face, hands, ankles, feet, or legs.  Your baby makes fewer than 10 movements in 2 hours.  You have severe headaches that do not go away with medicine.  You have vision changes. Summary  The third trimester is from week 29 through week 40, months 7 through 9. The third trimester is a time when the unborn baby (fetus)   is growing rapidly.  During the third trimester, your discomfort may increase as you and your baby continue to gain weight. You may have abdominal, leg, and back pain, sleeping problems, and an increased need to urinate.  During the third trimester your breasts will keep growing and they will continue to become tender. A yellow fluid (colostrum) may leak from your breasts. This is the first milk you are producing for your baby.  False labor is a condition in which you feel small, irregular tightenings of the muscles in the womb (contractions) that eventually go away. These are called Braxton Hicks contractions. Contractions may last for hours, days, or even weeks before true labor sets in.  Signs of labor can include: abdominal cramps; regular contractions that start at 10 minutes apart and become stronger and more frequent with time; watery or bloody mucus discharge that comes from the vagina; increased pelvic pressure and dull back pain; and leaking of amniotic fluid. This information is not intended to replace advice given to you by your health care provider. Make sure you discuss any questions you have with your health care provider. Document Released: 12/24/2000 Document Revised: 06/07/2015 Document Reviewed: 03/02/2012 Elsevier Interactive Patient Education  2017 [employer]  
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
## 2016-02-19 ENCOUNTER — Ambulatory Visit (HOSPITAL_COMMUNITY)
Admission: RE | Admit: 2016-02-19 | Discharge: 2016-02-19 | Disposition: A | Discharge status: Home / Self Care | Payer: Medicaid Other | Source: Ambulatory Visit | Attending: Family Medicine | Admitting: Family Medicine

## 2016-02-21 ENCOUNTER — Ambulatory Visit (HOSPITAL_COMMUNITY)
Admission: RE | Admit: 2016-02-21 | Discharge: 2016-02-21 | Disposition: A | Discharge status: Home / Self Care | Payer: Medicaid Other | Source: Ambulatory Visit | Attending: Family Medicine | Admitting: Family Medicine

## 2016-02-21 ENCOUNTER — Other Ambulatory Visit (HOSPITAL_COMMUNITY): Payer: Self-pay | Admitting: Maternal and Fetal Medicine

## 2016-02-21 ENCOUNTER — Encounter (HOSPITAL_COMMUNITY): Payer: Self-pay

## 2016-02-21 ENCOUNTER — Other Ambulatory Visit: Payer: Self-pay

## 2016-02-21 DIAGNOSIS — O365930 Maternal care for other known or suspected poor fetal growth, third trimester, not applicable or unspecified: Secondary | ICD-10-CM | POA: Insufficient documentation

## 2016-02-21 DIAGNOSIS — Z3A28 28 weeks gestation of pregnancy: Secondary | ICD-10-CM | POA: Insufficient documentation

## 2016-02-21 DIAGNOSIS — O99323 Drug use complicating pregnancy, third trimester: Secondary | ICD-10-CM

## 2016-02-21 DIAGNOSIS — Z3689 Encounter for other specified antenatal screening: Secondary | ICD-10-CM | POA: Diagnosis not present

## 2016-02-21 DIAGNOSIS — Z362 Encounter for other antenatal screening follow-up: Secondary | ICD-10-CM | POA: Insufficient documentation

## 2016-02-21 DIAGNOSIS — O365990 Maternal care for other known or suspected poor fetal growth, unspecified trimester, not applicable or unspecified: Secondary | ICD-10-CM

## 2016-02-21 MED ORDER — BETAMETHASONE SOD PHOS & ACET 6 (3-3) MG/ML IJ SUSP: 12.0000 mg | Freq: Once | INTRAMUSCULAR | Status: DC

## 2016-02-21 MED ORDER — BETAMETHASONE SOD PHOS & ACET 6 (3-3) MG/ML IJ SUSP
12.0000 mg | Freq: Once | INTRAMUSCULAR | Status: DC
  Filled 2016-02-21: qty 2

## 2016-02-21 NOTE — ED Notes (Signed)
BMZ #1 given in left upper outer quad in [facility] today.  Pt tolerated well.  Pt verbalized to come tomorrow for second injection between 2-4pm.

## 2016-02-22 ENCOUNTER — Ambulatory Visit (HOSPITAL_COMMUNITY)
Admission: RE | Admit: 2016-02-22 | Discharge: 2016-02-22 | Disposition: A | Discharge status: Home / Self Care | Payer: Medicaid Other | Source: Ambulatory Visit | Attending: Family Medicine | Admitting: Family Medicine

## 2016-02-22 DIAGNOSIS — Z3A27 27 weeks gestation of pregnancy: Secondary | ICD-10-CM | POA: Insufficient documentation

## 2016-02-22 DIAGNOSIS — O0992 Supervision of high risk pregnancy, unspecified, second trimester: Secondary | ICD-10-CM | POA: Diagnosis present

## 2016-02-22 MED ORDER — BETAMETHASONE SOD PHOS & ACET 6 (3-3) MG/ML IJ SUSP
12.0000 mg | Freq: Once | INTRAMUSCULAR | Status: DC
  Filled 2016-02-22: qty 2

## 2016-02-22 NOTE — ED Notes (Signed)
Second dose of BMZ given in rt glute today in [facility].  Pt tolerated well.

## 2016-02-28 ENCOUNTER — Ambulatory Visit (HOSPITAL_COMMUNITY)
Admission: RE | Admit: 2016-02-28 | Discharge: 2016-02-28 | Disposition: A | Discharge status: Home / Self Care | Payer: Medicaid Other | Source: Ambulatory Visit | Attending: Family Medicine | Admitting: Family Medicine

## 2016-02-28 ENCOUNTER — Encounter (HOSPITAL_COMMUNITY): Payer: Self-pay

## 2016-02-28 ENCOUNTER — Other Ambulatory Visit (HOSPITAL_COMMUNITY): Payer: Self-pay | Admitting: Maternal and Fetal Medicine

## 2016-02-28 DIAGNOSIS — O365930 Maternal care for other known or suspected poor fetal growth, third trimester, not applicable or unspecified: Secondary | ICD-10-CM | POA: Insufficient documentation

## 2016-02-28 DIAGNOSIS — F17200 Nicotine dependence, unspecified, uncomplicated: Secondary | ICD-10-CM

## 2016-02-28 DIAGNOSIS — Z3A29 29 weeks gestation of pregnancy: Secondary | ICD-10-CM | POA: Insufficient documentation

## 2016-02-28 DIAGNOSIS — O162 Unspecified maternal hypertension, second trimester: Secondary | ICD-10-CM

## 2016-02-28 DIAGNOSIS — O365921 Maternal care for other known or suspected poor fetal growth, second trimester, fetus 1: Secondary | ICD-10-CM

## 2016-02-28 DIAGNOSIS — O99323 Drug use complicating pregnancy, third trimester: Secondary | ICD-10-CM | POA: Diagnosis not present

## 2016-02-28 NOTE — ED Notes (Signed)
Pt reports white discharge and vaginal itching.

## 2016-03-04 ENCOUNTER — Telehealth: Payer: Self-pay | Admitting: General Practice

## 2016-03-04 NOTE — Telephone Encounter (Signed)
Patient called into front office on 2/15 requesting medication for yeast infection due to recent antibiotic use. Called patient, no answer- no option to leave message

## 2016-03-05 ENCOUNTER — Encounter: Payer: Self-pay | Admitting: Family Medicine

## 2016-03-05 ENCOUNTER — Encounter: Payer: Self-pay | Admitting: Obstetrics and Gynecology

## 2016-03-06 ENCOUNTER — Ambulatory Visit (HOSPITAL_COMMUNITY)
Admission: RE | Admit: 2016-03-06 | Discharge: 2016-03-06 | Disposition: A | Discharge status: Home / Self Care | Payer: Medicaid Other | Source: Ambulatory Visit | Attending: Family Medicine | Admitting: Family Medicine

## 2016-03-06 ENCOUNTER — Encounter (HOSPITAL_COMMUNITY): Payer: Self-pay

## 2016-03-06 VITALS — BP 104/59 | HR 90 | Wt 141.2 lb

## 2016-03-06 DIAGNOSIS — Z3A30 30 weeks gestation of pregnancy: Secondary | ICD-10-CM | POA: Insufficient documentation

## 2016-03-06 DIAGNOSIS — O99323 Drug use complicating pregnancy, third trimester: Secondary | ICD-10-CM | POA: Diagnosis present

## 2016-03-06 DIAGNOSIS — O365930 Maternal care for other known or suspected poor fetal growth, third trimester, not applicable or unspecified: Secondary | ICD-10-CM | POA: Insufficient documentation

## 2016-03-06 DIAGNOSIS — O365990 Maternal care for other known or suspected poor fetal growth, unspecified trimester, not applicable or unspecified: Secondary | ICD-10-CM

## 2016-03-13 ENCOUNTER — Encounter (HOSPITAL_COMMUNITY): Payer: Self-pay

## 2016-03-13 ENCOUNTER — Ambulatory Visit (HOSPITAL_COMMUNITY)
Admission: RE | Admit: 2016-03-13 | Discharge: 2016-03-13 | Disposition: A | Discharge status: Home / Self Care | Payer: Medicaid Other | Source: Ambulatory Visit | Attending: Family Medicine | Admitting: Family Medicine

## 2016-03-13 DIAGNOSIS — Z3A31 31 weeks gestation of pregnancy: Secondary | ICD-10-CM | POA: Diagnosis not present

## 2016-03-13 DIAGNOSIS — O365990 Maternal care for other known or suspected poor fetal growth, unspecified trimester, not applicable or unspecified: Secondary | ICD-10-CM

## 2016-03-13 DIAGNOSIS — O99323 Drug use complicating pregnancy, third trimester: Secondary | ICD-10-CM | POA: Insufficient documentation

## 2016-03-13 DIAGNOSIS — O365930 Maternal care for other known or suspected poor fetal growth, third trimester, not applicable or unspecified: Secondary | ICD-10-CM | POA: Diagnosis present

## 2016-03-13 NOTE — Procedures (Signed)
Rachel Lloyd Jun 06, 1985 [id number]  Fetus A Non-Stress Test Interpretation for 03/13/16  Indication: IUGR  Fetal Heart Rate A Mode: External Baseline Rate (A): 140 bpm Variability: Moderate Accelerations: None Decelerations: None  Uterine Activity Mode: Toco Contraction Frequency (min): Occ UC noted. Contraction Duration (sec): 80-100 Contraction Quality: Mild Resting Tone Palpated: Relaxed Resting Time: Adequate  Interpretation (Fetal Testing) Nonstress Test Interpretation: Non-reactive Comments: FHR tracing rev'd by Dr. Sherrie Georgeecker

## 2016-03-14 ENCOUNTER — Encounter (HOSPITAL_COMMUNITY): Payer: Self-pay

## 2016-03-14 ENCOUNTER — Ambulatory Visit (HOSPITAL_COMMUNITY)
Admission: RE | Admit: 2016-03-14 | Discharge: 2016-03-14 | Disposition: A | Discharge status: Home / Self Care | Payer: Medicaid Other | Source: Ambulatory Visit | Attending: Family Medicine | Admitting: Family Medicine

## 2016-03-20 ENCOUNTER — Encounter (HOSPITAL_COMMUNITY): Payer: Self-pay

## 2016-03-20 ENCOUNTER — Ambulatory Visit (HOSPITAL_COMMUNITY)
Admission: RE | Admit: 2016-03-20 | Discharge: 2016-03-20 | Disposition: A | Discharge status: Home / Self Care | Payer: Medicaid Other | Source: Ambulatory Visit | Attending: Family Medicine | Admitting: Family Medicine

## 2016-03-20 DIAGNOSIS — O99323 Drug use complicating pregnancy, third trimester: Secondary | ICD-10-CM | POA: Diagnosis not present

## 2016-03-20 DIAGNOSIS — O365930 Maternal care for other known or suspected poor fetal growth, third trimester, not applicable or unspecified: Secondary | ICD-10-CM | POA: Insufficient documentation

## 2016-03-20 DIAGNOSIS — Z3A32 32 weeks gestation of pregnancy: Secondary | ICD-10-CM | POA: Diagnosis not present

## 2016-03-20 MED FILL — Betamethasone Sod Phosphate & Acetate Inj Susp 6 (3-3) MG/ML: INTRAMUSCULAR | Qty: 2 | Status: AC

## 2016-03-23 ENCOUNTER — Inpatient Hospital Stay (HOSPITAL_COMMUNITY)
Admission: AD | Admit: 2016-03-23 | Discharge: 2016-03-24 | Disposition: A | Discharge status: Home / Self Care | Payer: Medicaid Other | Source: Ambulatory Visit | Attending: Obstetrics & Gynecology | Admitting: Obstetrics & Gynecology

## 2016-03-23 ENCOUNTER — Encounter (HOSPITAL_COMMUNITY): Payer: Self-pay | Admitting: *Deleted

## 2016-03-23 DIAGNOSIS — O2343 Unspecified infection of urinary tract in pregnancy, third trimester: Secondary | ICD-10-CM | POA: Diagnosis not present

## 2016-03-23 DIAGNOSIS — M79604 Pain in right leg: Secondary | ICD-10-CM | POA: Diagnosis not present

## 2016-03-23 DIAGNOSIS — R102 Pelvic and perineal pain: Secondary | ICD-10-CM | POA: Diagnosis not present

## 2016-03-23 DIAGNOSIS — O23593 Infection of other part of genital tract in pregnancy, third trimester: Secondary | ICD-10-CM | POA: Insufficient documentation

## 2016-03-23 DIAGNOSIS — O9989 Other specified diseases and conditions complicating pregnancy, childbirth and the puerperium: Secondary | ICD-10-CM | POA: Diagnosis not present

## 2016-03-23 DIAGNOSIS — O99333 Smoking (tobacco) complicating pregnancy, third trimester: Secondary | ICD-10-CM | POA: Diagnosis not present

## 2016-03-23 DIAGNOSIS — B9689 Other specified bacterial agents as the cause of diseases classified elsewhere: Secondary | ICD-10-CM | POA: Insufficient documentation

## 2016-03-23 DIAGNOSIS — B9620 Unspecified Escherichia coli [E. coli] as the cause of diseases classified elsewhere: Secondary | ICD-10-CM | POA: Insufficient documentation

## 2016-03-23 DIAGNOSIS — F17210 Nicotine dependence, cigarettes, uncomplicated: Secondary | ICD-10-CM | POA: Insufficient documentation

## 2016-03-23 DIAGNOSIS — Z880 Allergy status to penicillin: Secondary | ICD-10-CM | POA: Diagnosis not present

## 2016-03-23 DIAGNOSIS — O99320 Drug use complicating pregnancy, unspecified trimester: Secondary | ICD-10-CM | POA: Diagnosis present

## 2016-03-23 DIAGNOSIS — M25551 Pain in right hip: Secondary | ICD-10-CM | POA: Diagnosis present

## 2016-03-23 DIAGNOSIS — Z3A32 32 weeks gestation of pregnancy: Secondary | ICD-10-CM | POA: Insufficient documentation

## 2016-03-23 LAB — WET PREP, GENITAL
Sperm: NONE SEEN
Trich, Wet Prep: NONE SEEN
Yeast Wet Prep HPF POC: NONE SEEN

## 2016-03-23 LAB — URINALYSIS, ROUTINE W REFLEX MICROSCOPIC
Bilirubin Urine: NEGATIVE
Glucose, UA: NEGATIVE mg/dL
Hgb urine dipstick: NEGATIVE
Ketones, ur: NEGATIVE mg/dL
Nitrite: POSITIVE — AB
Protein, ur: NEGATIVE mg/dL
Specific Gravity, Urine: 1.025 (ref 1.005–1.030)
pH: 5 (ref 5.0–8.0)

## 2016-03-23 LAB — RAPID URINE DRUG SCREEN, HOSP PERFORMED
Amphetamines: NOT DETECTED
Barbiturates: NOT DETECTED
Benzodiazepines: NOT DETECTED
Cocaine: NOT DETECTED
Opiates: NOT DETECTED
Tetrahydrocannabinol: POSITIVE — AB

## 2016-03-23 MED ORDER — NITROFURANTOIN MONOHYD MACRO 100 MG PO CAPS: 100.0000 mg | ORAL_CAPSULE | Freq: Two times a day (BID) | ORAL | 0 refills | Status: DC

## 2016-03-23 MED ORDER — METRONIDAZOLE 500 MG PO TABS: 500.0000 mg | ORAL_TABLET | Freq: Two times a day (BID) | ORAL | 0 refills | Status: DC

## 2016-03-23 NOTE — Discharge Instructions (Signed)
You need to do your gestational diabetes test at your next appointment. You can do a one hour glucose tolerance test with jelly beans (18 Brach's jelly beans OR 28 Starburst jelly beans). You need to bring your own jelly beans.  Pregnancy and Urinary Tract Infection What is a urinary tract infection? A urinary tract infection (UTI) is an infection of any part of the urinary tract. This includes the kidneys, the tubes that connect your kidneys to your bladder (ureters), the bladder, and the tube that carries urine out of your body (urethra). These organs make, store, and get rid of urine in the body. A UTI can be a bladder infection (cystitis) or a kidney infection (pyelonephritis). This infection may be caused by fungi, viruses, and bacteria. Bacteria are the most common cause of UTIs. You are more likely to develop a UTI during pregnancy because:  The physical and hormonal changes your body goes through can make it easier for bacteria to get into your urinary tract.  Your growing baby puts pressure on your uterus and can affect urine flow. Does a UTI place my baby at risk? An untreated UTI during pregnancy could lead to a kidney infection, which can cause health problems that could affect your baby. Possible complications of an untreated UTI include:  Having your baby before 37 weeks of pregnancy (premature).  Having a baby with a low birth weight.  Developing high blood pressure during pregnancy (preeclampsia). What are the symptoms of a UTI? Symptoms of a UTI include:  Fever.  Frequent urination or passing small amounts of urine frequently.  Needing to urinate urgently.  Pain or a burning sensation with urination.  Urine that smells bad or unusual.  Cloudy urine.  Pain in the lower abdomen or back.  Trouble urinating.  Blood in the urine.  Vomiting or being less hungry than normal.  Diarrhea or abdominal pain.  Vaginal discharge. What are the treatment options for a UTI  during pregnancy? Treatment for this condition may include:  Antibiotic medicines that are safe to take during pregnancy.  Other medicines to treat less common causes of UTI. How can I prevent a UTI?   To prevent a UTI:  Go to the bathroom as soon as you feel the need.  Always wipe from front to back.  Wash your genital area with soap and warm water daily.  Empty your bladder before and after sex.  Wear cotton underwear.  Limit your intake of high sugar foods or drinks, such as regular soda, juice, and sweets.  Drink 6-8 glasses of water daily.  Do not wear tight-fitting pants.  Do not douche or use deodorant sprays.  Do not drink alcohol, caffeine, or carbonated drinks. These can irritate the bladder. Contact a health care provider if:  Your symptoms do not improve or get worse.  You have a fever after two days of treatment.  You have a rash.  You have abnormal vaginal discharge.  You have back or side pain.  You have chills.  You have nausea and vomiting. Get help right away if: Seek immediate medical care if you are pregnant and:  You feel contractions in your uterus.  You have lower belly pain.  You have a gush of fluid from your vagina.  You have blood in your urine.  You are vomiting and cannot keep down any medicines or water. This information is not intended to replace advice given to you by your health care provider. Make sure you discuss any  questions you have with your health care provider. Document Released: 04/26/2010 Document Revised: 12/14/2015 Document Reviewed: 11/20/2014 Elsevier Interactive Patient Education  2017 [employer].

## 2016-03-23 NOTE — MAU Note (Signed)
Pt. Here with c/o of right thigh and right lower abd. Pain, aching cramping pain. Pain 8/10.  Pain started yesterday around noon, pt currently getting worse.  U/S applied FHR -136bpm, Toco applied, no contractions palpated. VSS. Denies vaginal bleeding, no sudden gush of fluid, last sexual encounter was one month ago, and positive for fetal movement - per patient.

## 2016-03-23 NOTE — MAU Provider Note (Signed)
Chief Complaint: Abdominal Pain   First Provider Initiated Contact with Patient 03/23/16 2134     SUBJECTIVE HPI: Rachel Lloyd is a 31 y.o. [id number] at [redacted]w[redacted]d who presents to Maternity Admissions reporting right hip pain radiating to right hip for 2 days. Denies contractions, leakage of fluid or vaginal bleeding. Good fetal movement.   Location: right lateral thigh Quality: ache Severity: 8/10 in pain scale Duration: intermitent Context: pregnant Timing: 2 days ago Modifying factors: none Associated signs and symptoms: none  Pregnancy Course:   Past Medical History:  Diagnosis Date  . Chlamydia   . Hypertension    During second pregnancy  . Mental disorder 2010   bipolar  . Pneumonia   . Trichimoniasis    OB History  Gravida Para Term Preterm AB Living  8 3 3  0 4 3  SAB TAB Ectopic Multiple Live Births  1 3 0 0 3    # Outcome Date GA Lbr Len/2nd Weight Sex Delivery Anes PTL Lv  8 Current           7 TAB 05/2014          6 TAB 06/25/13          5 Term 08/01/12 [redacted]w[redacted]d 05:58 / 00:15 6 lb 12.4 oz (3.073 kg) F Vag-Spont EPI  LIV  4 Term 04/12/11 [redacted]w[redacted]d 12:00 / 04:38 8 lb 2.3 oz (3.695 kg) M Vag-Vacuum EPI  LIV     Birth Comments: caput  3 TAB 01/30/06      None  DEC     Birth Comments: Pt reports she had a TAB at 24 weeks in [place].  Denies any fetal anomalies.    2 Term 04/22/04 109w5d  6 lb 14 oz (3.118 kg) M Vag-Spont EPI N LIV  1 SAB              Past Surgical History:  Procedure Laterality Date  . INDUCED ABORTION    . MOUTH SURGERY     as a child  . WISDOM TOOTH EXTRACTION     Family History  Problem Relation Age of Onset  . Hypertension Mother   . Cancer Mother   . Fibroids Mother   . ADD / ADHD Brother   . Hypertension Maternal Grandmother   . Anesthesia problems Neg Hx    Social History  Substance Use Topics  . Smoking status: Current Some Day Smoker    Types: Cigarettes    Last attempt to quit: 04/04/2010  . Smokeless tobacco: Never Used  .  Alcohol use No     Comment: quit when found out was pregnant   Allergies  Allergen Reactions  . Penicillins Anaphylaxis    Has patient had a PCN reaction causing immediate rash, facial/tongue/throat swelling, SOB or lightheadedness with hypotension: Yes Has patient had a PCN reaction causing severe rash involving mucus membranes or skin necrosis: Yes Has patient had a PCN reaction that required hospitalization Yes Has patient had a PCN reaction occurring within the last 10 years: No If all of the above answers are "NO", then may proceed with Cephalosporin use.  . Latex Itching and Rash   Facility-Administered Medications Prior to Admission  Medication Dose Route Frequency Provider Last Rate Last Dose  . prenatal vitamin w/FE, FA (NATACHEW) chewable tablet 1 tablet  1 tablet Oral Q1200 Adam Phenix, MD       Prescriptions Prior to Admission  Medication Sig Dispense Refill Last Dose  . acetaminophen (TYLENOL) 325 MG  tablet Take 650 mg by mouth every 6 (six) hours as needed for mild pain or headache.   03/22/2016 at Unknown time  . cetirizine (ZYRTEC) 10 MG tablet Take 1 tablet (10 mg total) by mouth daily. (Patient not taking: Reported on 01/18/2016) 14 tablet 1 Not Taking  . docusate sodium (COLACE) 100 MG capsule Take 1 capsule (100 mg total) by mouth 2 (two) times daily. (Patient not taking: Reported on 01/18/2016) 30 capsule 0 Not Taking  . famotidine (PEPCID) 20 MG tablet Take 1 tablet (20 mg total) by mouth daily. (Patient not taking: Reported on 01/18/2016) 30 tablet 1 Not Taking  . glycopyrrolate (ROBINUL) 2 MG tablet Take 1 tablet (2 mg total) by mouth 3 (three) times daily. (Patient not taking: Reported on 01/18/2016) 30 tablet 3 Not Taking  . pantoprazole (PROTONIX) 40 MG tablet Take 1 tablet (40 mg total) by mouth daily. (Patient not taking: Reported on 02/21/2016) 30 tablet 2 Not Taking  . polyethylene glycol powder (GLYCOLAX/MIRALAX) powder Take 17 g by mouth 2 (two) times daily as  needed. (Patient not taking: Reported on 01/18/2016) 3350 g 1 Not Taking  . Prenatal Vit-Fe Fumarate-FA (MULTIVITAMIN-PRENATAL) 27-0.8 MG TABS tablet Take 1 tablet by mouth daily at 12 noon. (Patient not taking: Reported on 03/13/2016) 30 each 11 Not Taking  . promethazine (PHENERGAN) 25 MG tablet Take 1 tablet (25 mg total) by mouth every 6 (six) hours as needed for nausea or vomiting. (Patient not taking: Reported on 01/18/2016) 30 tablet 1 Not Taking  . [DISCONTINUED] metroNIDAZOLE (FLAGYL) 500 MG tablet Take 500 mg by mouth 2 (two) times daily.   More than a month at Unknown time  . [DISCONTINUED] nitrofurantoin, macrocrystal-monohydrate, (MACROBID) 100 MG capsule Take 100 mg by mouth 2 (two) times daily.   More than a month at Unknown time    I have reviewed patient's Past Medical Hx, Surgical Hx, Family Hx, Social Hx, medications and allergies.   ROS:  Review of Systems  Constitutional: Negative for chills and fever.  Eyes: Negative for photophobia and visual disturbance.  Respiratory: Negative for cough, shortness of breath and wheezing.   Cardiovascular: Negative for chest pain, palpitations and leg swelling.  Gastrointestinal: Negative for abdominal pain, constipation, diarrhea, nausea and vomiting.  Genitourinary: Positive for dysuria, frequency, pelvic pain, urgency and vaginal discharge. Negative for hematuria, vaginal bleeding and vaginal pain.  Musculoskeletal: Negative for joint swelling and neck pain.  Neurological: Negative for light-headedness and headaches.    Physical Exam   Patient Vitals for the past 24 hrs:  BP Temp Temp src Pulse Resp SpO2 Height Weight  03/23/16 2216 (!) 90/50 98.3 F (36.8 C) - 89 17 - - -  03/23/16 2111 - - - 113 - 98 % - -  03/23/16 2042 101/58 98.8 F (37.1 C) Oral 100 18 - 5\' 2"  (1.575 m) 143 lb (64.9 kg)  03/23/16 2040 - - - 99 - 95 % - -   Constitutional: Well-developed, well-nourished female in no acute distress.  Cardiovascular: normal  rate Respiratory: normal effort GI: Abd soft, non-tender, gravid appropriate for gestational age. Pos BS x 4 MS: right lateral thigh minimally tender, no edema, normal ROM Neurologic: Alert and oriented x 4.  GU: Neg CVAT.  Pelvic: NEFG, physiologic discharge, no blood, cervix clean. No CMT  Dilation: Closed Effacement (%): Thick Cervical Position: Posterior Exam by:: Dr. Abelardo Diesel  FHT:  Baseline 135 , moderate variability, accelerations present, no decelerations (difficult to trace requiring nurse to hold  tracer in place to read likely due to small GA size baby). Contractions: none   Labs: Results for orders placed or performed during the hospital encounter of 03/23/16 (from the past 24 hour(s))  Urinalysis, Routine w reflex microscopic     Status: Abnormal   Collection Time: 03/23/16  9:54 PM  Result Value Ref Range   Color, Urine YELLOW YELLOW   APPearance HAZY (A) CLEAR   Specific Gravity, Urine 1.025 1.005 - 1.030   pH 5.0 5.0 - 8.0   Glucose, UA NEGATIVE NEGATIVE mg/dL   Hgb urine dipstick NEGATIVE NEGATIVE   Bilirubin Urine NEGATIVE NEGATIVE   Ketones, ur NEGATIVE NEGATIVE mg/dL   Protein, ur NEGATIVE NEGATIVE mg/dL   Nitrite POSITIVE (A) NEGATIVE   Leukocytes, UA SMALL (A) NEGATIVE   RBC / HPF 0-5 0 - 5 RBC/hpf   WBC, UA 6-30 0 - 5 WBC/hpf   Bacteria, UA FEW (A) NONE SEEN   Squamous Epithelial / LPF 6-30 (A) NONE SEEN   Mucous PRESENT   Wet prep, genital     Status: Abnormal   Collection Time: 03/23/16  9:54 PM  Result Value Ref Range   Yeast Wet Prep HPF POC NONE SEEN NONE SEEN   Trich, Wet Prep NONE SEEN NONE SEEN   Clue Cells Wet Prep HPF POC PRESENT (A) NONE SEEN   WBC, Wet Prep HPF POC FEW (A) NONE SEEN   Sperm NONE SEEN   Urine rapid drug screen (hosp performed)     Status: Abnormal   Collection Time: 03/23/16  9:54 PM  Result Value Ref Range   Opiates NONE DETECTED NONE DETECTED   Cocaine NONE DETECTED NONE DETECTED   Benzodiazepines NONE DETECTED NONE  DETECTED   Amphetamines NONE DETECTED NONE DETECTED   Tetrahydrocannabinol POSITIVE (A) NONE DETECTED   Barbiturates NONE DETECTED NONE DETECTED    Imaging:  [place] Mfm Fetal Bpp Wo Non Stress  Result Date: 03/20/2016 ----------------------------------------------------------------------  OBSTETRICS REPORT                      (Signed Final 03/20/2016 12:26 pm) ---------------------------------------------------------------------- Patient Info  ID #:       [id number]                         D.O.B.:   08/04/1985 (31 yrs)  Name:       MAYCEE BLASCO [facility]                 Visit Date:  03/20/2016 11:21 am ---------------------------------------------------------------------- Performed By  Performed By:     Tomma Lightning             Ref. Address:     [facility]                    RDMS,RVT                                                             OB/Gyn Clinic  [address]                                                             [place], [place]                                                             [address]  Attending:        Candis Shine        Location:         [facility]                    MD  Referred By:      [facility] for                    [facility]                    Healthcare ---------------------------------------------------------------------- Orders   #  Description                                 Code   1  [place] MFM FETAL BPP WO NON STRESS              60454.09   2  [place] MFM UA CORD DOPPLER                      76820.02  ----------------------------------------------------------------------   #  Ordered By               Order #        Accession #    Episode #   1  Particia Nearing            [id number]      [id number]     [id number]   2  MARTHA DECKER            [id number]      [id number]     [id number]   ---------------------------------------------------------------------- Indications   [redacted] weeks gestation of pregnancy                Z3A.32   Maternal care for known or suspected poor      O36.5930   fetal growth, third trimester, not applicable or   unspecified   Drug use complicating pregnancy, third         O99.323   trimester  ---------------------------------------------------------------------- OB History  Gravidity:    8         Term:   3  Prem:   0        SAB:   1  TOP:          3       Ectopic:  0        Living: 3 ---------------------------------------------------------------------- Fetal Evaluation  Num Of Fetuses:     1  Fetal Heart         133  Rate(bpm):  Cardiac Activity:   Observed  Presentation:       Cephalic  Placenta:           Anterior, above cervical os  Amniotic Fluid  AFI FV:      Subjectively within normal limits  AFI Sum(cm)     %Tile       Largest Pocket(cm)  17.42           64          6.32  RUQ(cm)       RLQ(cm)       LUQ(cm)        LLQ(cm)  4.54          6.32          2.02           4.54 ---------------------------------------------------------------------- Biophysical Evaluation  Amniotic F.V:   Within normal limits       F. Tone:        Observed  F. Movement:    Observed                   Score:          8/8  F. Breathing:   Observed ---------------------------------------------------------------------- Gestational Age  LMP:           32w 0d       Date:   08/09/15                 EDD:   05/15/16  Best:          Armida Sans 0d    Det. By:   LMP  (08/09/15)          EDD:   05/15/16 ---------------------------------------------------------------------- Doppler - Fetal Vessels  Umbilical Artery   S/D     %tile                                     PSV    ADFV    RDFV                                                   (cm/s)  2.27       26                                     35.41      No      No ---------------------------------------------------------------------- Impression  Single IUP at 32w  0d  Suspected fetal growth restriction (EFW < 10th %tile; AC <  3rd %tile on 3/1)  BPP 8/8  UA Doppler studies normal for gestational age  Normal amniotic fluid volume ---------------------------------------------------------------------- Recommendations  Continue weekly BPPs, UA Doppler studies  Ultrasound for growth on 3/22  If UA Doppler studies remain normal, would recommend  delivery at 38-39 weeks ----------------------------------------------------------------------                Candis Shine, MD Electronically Signed Final Report   03/20/2016 12:26 pm ----------------------------------------------------------------------  [place] Mfm Fetal Bpp Wo Non Stress  Result Date: 03/13/2016 ----------------------------------------------------------------------  OBSTETRICS REPORT                      (Signed Final 03/13/2016 02:27 pm) ---------------------------------------------------------------------- Patient Info  ID #:       [id number]                         D.O.B.:   02-13-1985 (31 yrs)  Name:       MADALIN HUGHART [facility]                 Visit Date:  03/13/2016 12:17 pm ---------------------------------------------------------------------- Performed By  Performed By:     Eden Lathe BS      Ref. Address:     [facility]                    RDMS RVT                                                             OB/Gyn Clinic                                                             [address]                                                             [place], [place]                                                             [address]  Attending:        Particia Nearing MD       Location:         [facility]  Referred By:      [facility] for                    [facility]  Healthcare ---------------------------------------------------------------------- Orders   #  Description                                  Code   1  [place] MFM FETAL BPP WO NON STRESS              76819.01   2  [place] MFM UA CORD DOPPLER                      76820.02   3  [place] MFM OB FOLLOW UP                         98119.14  ----------------------------------------------------------------------   #  Ordered By               Order #        Accession #    Episode #   1  Particia Nearing            [id number]      [id number]     [id number]   2  MARTHA DECKER            [id number]      [id number]     [id number]   3  MARTHA DECKER            [id number]      [id number]     [id number]  ---------------------------------------------------------------------- Indications   [redacted] weeks gestation of pregnancy                Z3A.31   Maternal care for known or suspected poor      O36.5930   fetal growth, third trimester, not applicable or   unspecified   Drug use complicating pregnancy, third         O99.323   trimester  ---------------------------------------------------------------------- OB History  Gravidity:    8         Term:   3        Prem:   0        SAB:   1  TOP:          3       Ectopic:  0        Living: 3 ---------------------------------------------------------------------- Fetal Evaluation  Num Of Fetuses:     1  Fetal Heart         140  Rate(bpm):  Cardiac Activity:   Observed  Presentation:       Cephalic  Placenta:           Anterior, above cervical os  P. Cord Insertion:  Visualized, central  Amniotic Fluid  AFI FV:      Subjectively within normal limits  AFI Sum(cm)     %Tile       Largest Pocket(cm)  16.9            62          6.46  RUQ(cm)                     LUQ(cm)        LLQ(cm)  4.17                        6.27           6.46 ---------------------------------------------------------------------- Biophysical Evaluation  Amniotic F.V:   Within normal  limits       F. Tone:        Observed  F. Movement:    Observed                   N.S.T:          Nonreactive  F. Breathing:   Not Observed               Score:          6/10  ---------------------------------------------------------------------- Biometry  BPD:      71.9  mm     G. Age:  28w 6d          2  %    CI:        71.12   %   70 - 86                                                          FL/HC:      18.8   %   19.3 - 21.3  HC:      271.6  mm     G. Age:  29w 5d        < 3  %    HC/AC:      1.22       0.96 - 1.17  AC:      222.3  mm     G. Age:  26w 4d        < 3  %    FL/BPD:     71.1   %   71 - 87  FL:       51.1  mm     G. Age:  27w 2d        < 3  %    FL/AC:      23.0   %   20 - 24  HUM:        42  mm     G. Age:  25w 2d        < 5  %  Est. FW:    1057  gm      2 lb 5 oz   < 10  % ---------------------------------------------------------------------- Gestational Age  LMP:           31w 0d       Date:   08/09/15                 EDD:   05/15/16  U/S Today:     28w 1d                                        EDD:   06/04/16  Best:          31w 0d    Det. By:   LMP  (08/09/15)          EDD:   05/15/16 ---------------------------------------------------------------------- Anatomy  Cranium:               Appears normal         Aortic Arch:            Appears normal  Cavum:  Appears normal         Ductal Arch:            Previously seen  Ventricles:            Appears normal         Diaphragm:              Appears normal  Choroid Plexus:        Appears normal         Stomach:                Appears normal, left                                                                        sided  Cerebellum:            Appears normal         Abdomen:                Appears normal  Posterior Fossa:       Appears normal         Abdominal Wall:         Previously seen  Nuchal Fold:           Not applicable (>20    Cord Vessels:           Previously seen                         wks GA)  Face:                  Orbits and profile     Kidneys:                Appear normal                         previously seen  Lips:                  Appears normal         Bladder:                 Appears normal  Thoracic:              Appears normal         Spine:                  Previously seen  Heart:                 Previously seen        Upper Extremities:      Previously seen  RVOT:                  Previously seen        Lower Extremities:      Previously seen  LVOT:                  Appears normal  Other:  Fetus appears to be a female. Heels and 5th digit previously          visualized. Nasal bone visualized. ---------------------------------------------------------------------- Doppler - Fetal Vessels  Umbilical Artery  S/D     %tile     RI              PI              PSV    ADFV    RDFV                                                   (cm/s)  2.11       13   0.53             0.71             54.66      No      No ---------------------------------------------------------------------- Impression  SIUP at 31+0 weeks  Normal interval anatomy; anatomic survey complete  Normal amniotic fluid volume  EFW < 10th %tile; AC < 3rd %tile; 1057 grams; 2+5  BPP 6/10 (-2 for no BM and NR NST) ---------------------------------------------------------------------- Recommendations  Return for repeat BPP tomorrow ----------------------------------------------------------------------                 Particia Nearing, MD Electronically Signed Final Report   03/13/2016 02:27 pm ----------------------------------------------------------------------  [place] Mfm Fetal Bpp Wo Non Stress  Result Date: 03/06/2016 ----------------------------------------------------------------------  OBSTETRICS REPORT                      (Signed Final 03/06/2016 01:25 pm) ---------------------------------------------------------------------- Patient Info  ID #:       [id number]                         D.O.B.:   1985/03/23 (31 yrs)  Name:       SANTIAGO GRAF [facility]                 Visit Date:  03/06/2016 11:44 am ---------------------------------------------------------------------- Performed By  Performed By:     Marcellina Millin          Ref. Address:      [facility]                    RDMS                                                             OB/Gyn Clinic                                                             [address]                                                             [address]d  [place], [place]                                                             [address]  Attending:        Particia Nearing MD       Location:         [facility]  Referred By:      [facility] for                    [facility]                    Healthcare ---------------------------------------------------------------------- Orders   #  Description                                 Code   1  [place] MFM FETAL BPP WO NON STRESS              52841.32   2  [place] MFM UA CORD DOPPLER                      76820.02  ----------------------------------------------------------------------   #  Ordered By               Order #        Accession #    Episode #   1  Particia Nearing            [id number]      [id number]     [id number]   2  MARTHA DECKER            [id number]      [id number]     [id number]  ---------------------------------------------------------------------- Indications   [redacted] weeks gestation of pregnancy                Z3A.30   Maternal care for known or suspected poor      O36.5930   fetal growth, third trimester, not applicable or   unspecified   Drug use complicating pregnancy, 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)  ---------------------------------------------------------------------- OB History  Gravidity:    8         Term:   3        Prem:   0        SAB:   1  TOP:          3       Ectopic:  0        Living: 3 ---------------------------------------------------------------------- Fetal Evaluation  Num Of Fetuses:     1  Fetal Heart         132  Rate(bpm):  Cardiac Activity:   Observed  Presentation:       Breech  Amniotic Fluid  AFI FV:      Subjectively within normal limits  AFI  Sum(cm)     %Tile       Largest Pocket(cm)  13.14           39          3.86  RUQ(cm)  RLQ(cm)       LUQ(cm)        LLQ(cm)  3.86          2.96          2.65           3.67  Comment:    8/8 BPP in 8 mins. ---------------------------------------------------------------------- Biophysical Evaluation  Amniotic F.V:   Within normal limits       F. Tone:        Observed  F. Movement:    Observed                   Score:          8/8  F. Breathing:   Observed ---------------------------------------------------------------------- Gestational Age  LMP:           30w 0d       Date:   08/09/15                 EDD:   05/15/16  Best:          Georgiann Hahn 0d    Det. By:   LMP  (08/09/15)          EDD:   05/15/16 ---------------------------------------------------------------------- Doppler - Fetal Vessels  Umbilical Artery   S/D     %tile                                     PSV                                                   (cm/s)  2.55       34                                     42.36 ---------------------------------------------------------------------- Impression  SIUP at 30+0 weeks  Normal amniotic fluid volume  BPP 8/8  UA dopplers were normal for this GA ---------------------------------------------------------------------- Recommendations  Follow-up ultrasound for growth, BPP and UA dopplers next  week ----------------------------------------------------------------------                 Particia Nearing, MD Electronically Signed Final Report   03/06/2016 01:25 pm ----------------------------------------------------------------------  [place] Mfm Fetal Bpp Wo Non Stress  Result Date: 02/28/2016 ----------------------------------------------------------------------  OBSTETRICS REPORT                      (Signed Final 02/28/2016 12:17 pm) ---------------------------------------------------------------------- Patient Info  ID #:       [id number]                         D.O.B.:   03-14-1985 (31 yrs)  Name:       TIAN DAVISON [facility]                  Visit Date:  02/28/2016 11:11 am ---------------------------------------------------------------------- Performed By  Performed By:     Georgeanna Lea       Ref. Address:     [facility]  OB/Gyn Clinic                                                             [address]                                                             [place], [place]                                                             [address]  Attending:        Particia Nearing MD       Location:         [facility]  Referred By:      [facility] for                    Specialty [facility]                    Healthcare ---------------------------------------------------------------------- Orders   #  Description                                 Code   1  [place] MFM UA CORD DOPPLER                      [phone]   2  [place] MFM FETAL BPP WO NON STRESS              21308.65  ----------------------------------------------------------------------   #  Ordered By               Order #        Accession #    Episode #   1  Particia Nearing            [id number]      [id number]     [id number]   2  MARTHA DECKER            [id number]      [id number]     [id number]  ---------------------------------------------------------------------- Indications   [redacted] weeks gestation of pregnancy                Z3A.29   Maternal care for known or suspected poor      O36.5930   fetal growth, third trimester, not applicable or   unspecified   Drug use complicating pregnancy,  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)  ---------------------------------------------------------------------- OB History  Gravidity:    8         Term:   3        Prem:   0        SAB:   1  TOP:          3       Ectopic:  0        Living: 3 ---------------------------------------------------------------------- Fetal Evaluation  Num Of Fetuses:     1   Fetal Heart         143  Rate(bpm):  Cardiac Activity:   Observed  Presentation:       Cephalic  Placenta:           Anterior, above cervical os  Amniotic Fluid  AFI FV:      Subjectively within normal limits  AFI Sum(cm)     %Tile       Largest Pocket(cm)  13.4            41          4.39  RUQ(cm)       RLQ(cm)       LUQ(cm)        LLQ(cm)  2.49          4.39          3.56           2.96 ---------------------------------------------------------------------- Biophysical Evaluation  Amniotic F.V:   Within normal limits       F. Tone:        Observed  F. Movement:    Observed                   Score:          8/8  F. Breathing:   Observed ---------------------------------------------------------------------- Gestational Age  LMP:           29w 0d       Date:   08/09/15                 EDD:   05/15/16  Best:          29w 0d    Det. By:   LMP  (08/09/15)          EDD:   05/15/16 ---------------------------------------------------------------------- Doppler - Fetal Vessels  Umbilical Artery   S/D     %tile     RI              PI              PSV    ADFV    RDFV                                                   (cm/s)   2.5       27   0.56             0.76              35.6      No      No ---------------------------------------------------------------------- Impression  SIUP at 29+0 weeks  SGA/FGR  Normal amniotic fluid volume  BPP 8/8  UA dopplers were normal for this GA ---------------------------------------------------------------------- Recommendations  Continue weekly  BPP and UA dopplers  Growth [place] in 2 weeks ----------------------------------------------------------------------                 Particia Nearing, MD Electronically Signed Final Report   02/28/2016 12:17 pm ----------------------------------------------------------------------  [place] Mfm Ob Follow Up  Result Date: 03/13/2016 ----------------------------------------------------------------------  OBSTETRICS REPORT                      (Signed Final  03/13/2016 02:27 pm) ---------------------------------------------------------------------- Patient Info  ID #:       [id number]                         D.O.B.:   24-Nov-1985 (31 yrs)  Name:       GAYLEEN SHOLTZ [facility]                 Visit Date:  03/13/2016 12:17 pm ---------------------------------------------------------------------- Performed By  Performed By:     Eden Lathe BS      Ref. Address:     [facility]                    RDMS RVT                                                             OB/Gyn Clinic                                                             [address]                                                             [place], [place]                                                             [address]  Attending:        Particia Nearing MD       Location:         [facility]  Referred By:      [facility] for                    [facility]  Healthcare ---------------------------------------------------------------------- Orders   #  Description                                 Code   1  US MFM FETAL BPP WO NON STRESS              76819.01   2  US MFM UA CORD DOPPLER                      76820.02   3  US MFM OB FOLLOW UP                         [id number]  ----------------------------------------------------------------------   #  Ordered By               Order #        Accession #    Episode #   1  Particia NearingMARTHA DECKER            [id number]      [id number]     [id number]   2  MARTHA DECKER            [id number]      [id number]     [id number]   3  MARTHA DECKER            [id number]      [id number]     [id number]  ---------------------------------------------------------------------- Indications   [redacted] weeks gestation of pregnancy                Z3A.31   Maternal care for known or suspected poor      O36.5930   fetal growth, third trimester, not applicable or   unspecified   Drug use complicating  pregnancy, third         O99.323   trimester  ---------------------------------------------------------------------- OB History  Gravidity:    8         Term:   3        Prem:   0        SAB:   1  TOP:          3       Ectopic:  0        Living: 3 ---------------------------------------------------------------------- Fetal Evaluation  Num Of Fetuses:     1  Fetal Heart         140  Rate(bpm):  Cardiac Activity:   Observed  Presentation:       Cephalic  Placenta:           Anterior, above cervical os  P. Cord Insertion:  Visualized, central  Amniotic Fluid  AFI FV:      Subjectively within normal limits  AFI Sum(cm)     %Tile       Largest Pocket(cm)  16.9            62          6.46  RUQ(cm)                     LUQ(cm)        LLQ(cm)  4.17                        6.27           6.46 ---------------------------------------------------------------------- Biophysical Evaluation  Amniotic F.V:   Within normal  limits       F. Tone:        Observed  F. Movement:    Observed                   N.S.T:          Nonreactive  F. Breathing:   Not Observed               Score:          6/10 ---------------------------------------------------------------------- Biometry  BPD:      71.9  mm     G. Age:  28w 6d          2  %    CI:        71.12   %   70 - 86                                                          FL/HC:      18.8   %   19.3 - 21.3  HC:      271.6  mm     G. Age:  29w 5d        < 3  %    HC/AC:      1.22       0.96 - 1.17  AC:      222.3  mm     G. Age:  26w 4d        < 3  %    FL/BPD:     71.1   %   71 - 87  FL:       51.1  mm     G. Age:  27w 2d        < 3  %    FL/AC:      23.0   %   20 - 24  HUM:        42  mm     G. Age:  25w 2d        < 5  %  Est. FW:    1057  gm      2 lb 5 oz   < 10  % ---------------------------------------------------------------------- Gestational Age  LMP:           31w 0d       Date:   08/09/15                 EDD:   05/15/16  U/S Today:     28w 1d                                         EDD:   06/04/16  Best:          31w 0d    Det. By:   LMP  (08/09/15)          EDD:   05/15/16 ---------------------------------------------------------------------- Anatomy  Cranium:               Appears normal         Aortic Arch:            Appears normal  Cavum:  Appears normal         Ductal Arch:            Previously seen  Ventricles:            Appears normal         Diaphragm:              Appears normal  Choroid Plexus:        Appears normal         Stomach:                Appears normal, left                                                                        sided  Cerebellum:            Appears normal         Abdomen:                Appears normal  Posterior Fossa:       Appears normal         Abdominal Wall:         Previously seen  Nuchal Fold:           Not applicable (>20    Cord Vessels:           Previously seen                         wks GA)  Face:                  Orbits and profile     Kidneys:                Appear normal                         previously seen  Lips:                  Appears normal         Bladder:                Appears normal  Thoracic:              Appears normal         Spine:                  Previously seen  Heart:                 Previously seen        Upper Extremities:      Previously seen  RVOT:                  Previously seen        Lower Extremities:      Previously seen  LVOT:                  Appears normal  Other:  Fetus appears to be a female. Heels and 5th digit previously          visualized. Nasal bone visualized. ---------------------------------------------------------------------- Doppler - Fetal Vessels  Umbilical Artery   S/D     %  tile     RI              PI              PSV    ADFV    RDFV                                                   (cm/s)  2.11       13   0.53             0.71             54.66      No      No ---------------------------------------------------------------------- Impression  SIUP at 31+0 weeks  Normal  interval anatomy; anatomic survey complete  Normal amniotic fluid volume  EFW < 10th %tile; AC < 3rd %tile; 1057 grams; 2+5  BPP 6/10 (-2 for no BM and NR NST) ---------------------------------------------------------------------- Recommendations  Return for repeat BPP tomorrow ----------------------------------------------------------------------                 Particia Nearing, MD Electronically Signed Final Report   03/13/2016 02:27 pm ----------------------------------------------------------------------  [place] Mfm Ua Cord Doppler  Result Date: 03/20/2016 ----------------------------------------------------------------------  OBSTETRICS REPORT                      (Signed Final 03/20/2016 12:26 pm) ---------------------------------------------------------------------- Patient Info  ID #:       [id number]                         D.O.B.:   July 02, 1985 (31 yrs)  Name:       ZARAYA DELAUDER [facility]                 Visit Date:  03/20/2016 11:21 am ---------------------------------------------------------------------- Performed By  Performed By:     Tomma Lightning             Ref. Address:     [facility]                    RDMS,RVT                                                             OB/Gyn Clinic                                                             [address]                                                             [address]d  [place], [place]                                                             [address]  Attending:        Candis Shine        Location:         [facility]                    MD  Referred By:      [facility] for                    [facility]                    Healthcare ---------------------------------------------------------------------- Orders   #  Description                                 Code   1  [place] MFM FETAL BPP WO NON STRESS              29562.13   2  [place] MFM UA CORD DOPPLER                       76820.02  ----------------------------------------------------------------------   #  Ordered By               Order #        Accession #    Episode #   1  Particia Nearing            [id number]      [id number]     [id number]   2  MARTHA DECKER            [id number]      [id number]     [id number]  ---------------------------------------------------------------------- Indications   [redacted] weeks gestation of pregnancy                Z3A.32   Maternal care for known or suspected poor      O36.5930   fetal growth, third trimester, not applicable or   unspecified   Drug use complicating pregnancy, third         O99.323   trimester  ---------------------------------------------------------------------- OB History  Gravidity:    8         Term:   3        Prem:   0        SAB:   1  TOP:          3       Ectopic:  0        Living: 3 ---------------------------------------------------------------------- Fetal Evaluation  Num Of Fetuses:     1  Fetal Heart         133  Rate(bpm):  Cardiac Activity:   Observed  Presentation:       Cephalic  Placenta:           Anterior, above cervical os  Amniotic Fluid  AFI FV:      Subjectively within normal limits  AFI Sum(cm)     %  Tile       Largest Pocket(cm)  17.42           64          6.32  RUQ(cm)       RLQ(cm)       LUQ(cm)        LLQ(cm)  4.54          6.32          2.02           4.54 ---------------------------------------------------------------------- Biophysical Evaluation  Amniotic F.V:   Within normal limits       F. Tone:        Observed  F. Movement:    Observed                   Score:          8/8  F. Breathing:   Observed ---------------------------------------------------------------------- Gestational Age  LMP:           32w 0d       Date:   08/09/15                 EDD:   05/15/16  Best:          Armida Sans 0d    Det. By:   LMP  (08/09/15)          EDD:   05/15/16 ---------------------------------------------------------------------- Doppler - Fetal Vessels  Umbilical Artery   S/D      %tile                                     PSV    ADFV    RDFV                                                   (cm/s)  2.27       26                                     35.41      No      No ---------------------------------------------------------------------- Impression  Single IUP at 32w 0d  Suspected fetal growth restriction (EFW < 10th %tile; AC <  3rd %tile on 3/1)  BPP 8/8  UA Doppler studies normal for gestational age  Normal amniotic fluid volume ---------------------------------------------------------------------- Recommendations  Continue weekly BPPs, UA Doppler studies  Ultrasound for growth on 3/22  If UA Doppler studies remain normal, would recommend  delivery at 38-39 weeks ----------------------------------------------------------------------                Candis Shine, MD Electronically Signed Final Report   03/20/2016 12:26 pm ----------------------------------------------------------------------  [place] Mfm Ua Cord Doppler  Result Date: 03/13/2016 ----------------------------------------------------------------------  OBSTETRICS REPORT                      (Signed Final 03/13/2016 02:27 pm) ---------------------------------------------------------------------- Patient Info  ID #:       [id number]                         D.O.B.:   1985-06-07 (31 yrs)  Name:  EMALI HEYWARD [facility]                 Visit Date:  03/13/2016 12:17 pm ---------------------------------------------------------------------- Performed By  Performed By:     Eden Lathe BS      Ref. Address:     [facility]                    RDMS RVT                                                             OB/Gyn Clinic                                                             [address]                                                             [place], [place]                                                             [address]  Attending:        Particia Nearing MD        Location:         [facility]  Referred By:      [facility] for                    [facility]                    Healthcare ---------------------------------------------------------------------- Orders   #  Description                                 Code   1  [place] MFM FETAL BPP WO NON STRESS              60454.09   2  [place] MFM UA CORD DOPPLER                      76820.02   3  [place] MFM OB FOLLOW UP  16[address].60  ----------------------------------------------------------------------   #  Ordered By               Order #        Accession #    Episode #   1  MARTHA DECKER            [id number]      [id number]     [id number]   2  MARTHA DECKER            [id number]      [id number]     [id number]   3  MARTHA DECKER            [id number]      [id number]     [id number]  ---------------------------------------------------------------------- Indications   [redacted] weeks gestation of pregnancy                Z3A.31   Maternal care for known or suspected poor      O36.5930   fetal growth, third trimester, not applicable or   unspecified   Drug use complicating pregnancy, third         O99.323   trimester  ---------------------------------------------------------------------- OB History  Gravidity:    8         Term:   3        Prem:   0        SAB:   1  TOP:          3       Ectopic:  0        Living: 3 ---------------------------------------------------------------------- Fetal Evaluation  Num Of Fetuses:     1  Fetal Heart         140  Rate(bpm):  Cardiac Activity:   Observed  Presentation:       Cephalic  Placenta:           Anterior, above cervical os  P. Cord Insertion:  Visualized, central  Amniotic Fluid  AFI FV:      Subjectively within normal limits  AFI Sum(cm)     %Tile       Largest Pocket(cm)  16.9            62          6.46  RUQ(cm)                     LUQ(cm)        LLQ(cm)  4.17                        6.27           6.46  ---------------------------------------------------------------------- Biophysical Evaluation  Amniotic F.V:   Within normal limits       F. Tone:        Observed  F. Movement:    Observed                   N.S.T:          Nonreactive  F. Breathing:   Not Observed               Score:          6/10 ---------------------------------------------------------------------- Biometry  BPD:      71.9  mm     G. Age:  28w 6d          2  %    CI:        71.12   %  70 - 86                                                          FL/HC:      18.8   %   19.3 - 21.3  HC:      271.6  mm     G. Age:  29w 5d        < 3  %    HC/AC:      1.22       0.96 - 1.17  AC:      222.3  mm     G. Age:  26w 4d        < 3  %    FL/BPD:     71.1   %   71 - 87  FL:       51.1  mm     G. Age:  27w 2d        < 3  %    FL/AC:      23.0   %   20 - 24  HUM:        42  mm     G. Age:  25w 2d        < 5  %  Est. FW:    1057  gm      2 lb 5 oz   < 10  % ---------------------------------------------------------------------- Gestational Age  LMP:           31w 0d       Date:   08/09/15                 EDD:   05/15/16  U/S Today:     28w 1d                                        EDD:   06/04/16  Best:          31w 0d    Det. By:   LMP  (08/09/15)          EDD:   05/15/16 ---------------------------------------------------------------------- Anatomy  Cranium:               Appears normal         Aortic Arch:            Appears normal  Cavum:                 Appears normal         Ductal Arch:            Previously seen  Ventricles:            Appears normal         Diaphragm:              Appears normal  Choroid Plexus:        Appears normal         Stomach:                Appears normal, left  sided  Cerebellum:            Appears normal         Abdomen:                Appears normal  Posterior Fossa:       Appears normal         Abdominal Wall:         Previously seen  Nuchal Fold:            Not applicable (>20    Cord Vessels:           Previously seen                         wks GA)  Face:                  Orbits and profile     Kidneys:                Appear normal                         previously seen  Lips:                  Appears normal         Bladder:                Appears normal  Thoracic:              Appears normal         Spine:                  Previously seen  Heart:                 Previously seen        Upper Extremities:      Previously seen  RVOT:                  Previously seen        Lower Extremities:      Previously seen  LVOT:                  Appears normal  Other:  Fetus appears to be a female. Heels and 5th digit previously          visualized. Nasal bone visualized. ---------------------------------------------------------------------- Doppler - Fetal Vessels  Umbilical Artery   S/D     %tile     RI              PI              PSV    ADFV    RDFV                                                   (cm/s)  2.11       13   0.53             0.71             54.66      No      No ---------------------------------------------------------------------- Impression  SIUP at 31+0 weeks  Normal interval anatomy; anatomic survey complete  Normal amniotic fluid volume  EFW < 10th %tile; AC <  3rd %tile; 1057 grams; 2+5  BPP 6/10 (-2 for no BM and NR NST) ---------------------------------------------------------------------- Recommendations  Return for repeat BPP tomorrow ----------------------------------------------------------------------                 Particia Nearing, MD Electronically Signed Final Report   03/13/2016 02:27 pm ----------------------------------------------------------------------  [place] Mfm Ua Cord Doppler  Result Date: 03/06/2016 ----------------------------------------------------------------------  OBSTETRICS REPORT                      (Signed Final 03/06/2016 01:25 pm) ---------------------------------------------------------------------- Patient Info  ID  #:       [id number]                         D.O.B.:   1985/08/28 (31 yrs)  Name:       SOLIYANA MCCHRISTIAN [facility]                 Visit Date:  03/06/2016 11:44 am ---------------------------------------------------------------------- Performed By  Performed By:     Marcellina Millin          Ref. Address:     [facility]                                                             OB/Gyn Clinic                                                             [address]                                                             [place], [place]                                                             [address]  Attending:        Particia Nearing MD       Location:         [facility]  Referred By:      [facility]                    Healthcare ---------------------------------------------------------------------- Orders   #  Description                                 Code   1  [place] MFM FETAL BPP WO NON STRESS              [id number]   2  [place] MFM UA CORD DOPPLER                      [id number]  ----------------------------------------------------------------------   #  Ordered By               Order #        Accession #    Episode #   1  Particia Nearing            [id number]      [id number]     [id number]   2  MARTHA DECKER            [id number]      [id number]     [id number]  ---------------------------------------------------------------------- Indications   [redacted] weeks gestation of pregnancy                Z3A.30   Maternal care for known or suspected poor      O36.5930   fetal growth, third trimester, not applicable or   unspecified   Drug use complicating pregnancy, 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)  ---------------------------------------------------------------------- OB History  Gravidity:    8         Term:   3        Prem:   0        SAB:   1  TOP:          3       Ectopic:  0         Living: 3 ---------------------------------------------------------------------- Fetal Evaluation  Num Of Fetuses:     1  Fetal Heart         132  Rate(bpm):  Cardiac Activity:   Observed  Presentation:       Breech  Amniotic Fluid  AFI FV:      Subjectively within normal limits  AFI Sum(cm)     %Tile       Largest Pocket(cm)  13.14           39          3.86  RUQ(cm)       RLQ(cm)       LUQ(cm)        LLQ(cm)  3.86          2.96          2.65           3.67  Comment:    8/8 BPP in 8 mins. ---------------------------------------------------------------------- Biophysical Evaluation  Amniotic F.V:   Within normal limits       F. Tone:        Observed  F. Movement:    Observed                   Score:          8/8  F. Breathing:   Observed ---------------------------------------------------------------------- Gestational Age  LMP:  30w 0d       Date:   08/09/15                 EDD:   05/15/16  Best:          Georgiann Hahn 0d    Det. By:   LMP  (08/09/15)          EDD:   05/15/16 ---------------------------------------------------------------------- Doppler - Fetal Vessels  Umbilical Artery   S/D     %tile                                     PSV                                                   (cm/s)  2.55       34                                     42.36 ---------------------------------------------------------------------- Impression  SIUP at 30+0 weeks  Normal amniotic fluid volume  BPP 8/8  UA dopplers were normal for this GA ---------------------------------------------------------------------- Recommendations  Follow-up ultrasound for growth, BPP and UA dopplers next  week ----------------------------------------------------------------------                 Particia Nearing, MD Electronically Signed Final Report   03/06/2016 01:25 pm ----------------------------------------------------------------------  [place] Mfm Ua Cord Doppler  Result Date:  02/28/2016 ----------------------------------------------------------------------  OBSTETRICS REPORT                      (Signed Final 02/28/2016 12:17 pm) ---------------------------------------------------------------------- Patient Info  ID #:       [id number]                         D.O.B.:   02/25/1985 (31 yrs)  Name:       PATRECE TALLIE [facility]                 Visit Date:  02/28/2016 11:11 am ---------------------------------------------------------------------- Performed By  Performed By:     Georgeanna Lea       Ref. Address:     [facility]                                                             [address]                                                             [place], [place]                                                             [address]  Attending:        Particia Nearing MD       Location:         [facility]  Referred By:      [facility] for                    [facility]                    Healthcare ---------------------------------------------------------------------- Orders   #  Description                                 Code   1  [place] MFM UA CORD DOPPLER                      [phone]   2  [place] MFM FETAL BPP WO NON STRESS              81191.47  ----------------------------------------------------------------------   #  Ordered By               Order #        Accession #    Episode #   1  Particia Nearing            [id number]      [id number]     [id number]   2  MARTHA DECKER            [id number]      [id number]     [id number]  ---------------------------------------------------------------------- Indications   [redacted] weeks gestation of pregnancy                Z3A.29   Maternal care for known or suspected poor      O36.5930   fetal growth, third trimester, not applicable or   unspecified   Drug use complicating pregnancy, 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39  RUQ(cm)       RLQ(cm)       LUQ(cm)        LLQ(cm)  2.49          4.39          3.56           2.96 ---------------------------------------------------------------------- Biophysical Evaluation  Amniotic F.V:   Within normal limits       F. Tone:        Observed  F. Movement:    Observed                   Score:          8/8  F. Breathing:   Observed ---------------------------------------------------------------------- Gestational Age  LMP:           29w 0d       Date:   08/09/15                 EDD:   05/15/16  Best:          29w 0d    Det. By:   LMP  (08/09/15)          EDD:   05/15/16 ---------------------------------------------------------------------- Doppler - Fetal Vessels  Umbilical Artery   S/D     %tile     RI              PI              PSV    ADFV    RDFV                                                   (cm/s)   2.5       27   0.56             0.76              35.6      No      No ---------------------------------------------------------------------- Impression  SIUP at 29+0 weeks  SGA/FGR  Normal amniotic fluid volume  BPP 8/8  UA dopplers were normal for this GA ---------------------------------------------------------------------- Recommendations  Continue weekly BPP and UA dopplers  Growth [place] in 2 weeks  ----------------------------------------------------------------------                 Particia Nearing, MD Electronically Signed Final Report   02/28/2016 12:17 pm ----------------------------------------------------------------------   MAU Course: Orders Placed This Encounter  Procedures  . Wet prep, genital  . Culture, OB Urine  . Urinalysis, Routine w reflex microscopic  . Urine rapid drug screen (hosp performed)  . Lab instructions  . Discharge patient   Meds ordered this encounter  Medications  . metroNIDAZOLE (FLAGYL) 500 MG tablet    Sig: Take 1 tablet (500 mg total) by mouth 2 (two) times daily.    Dispense:  14 tablet    Refill:  0  Order Specific Question:   Supervising Provider    Answer:   Duane Lope H [[id number]]  . nitrofurantoin, macrocrystal-monohydrate, (MACROBID) 100 MG capsule    Sig: Take 1 capsule (100 mg total) by mouth 2 (two) times daily.    Dispense:  14 capsule    Refill:  0    Order Specific Question:   Supervising Provider    Answer:   Lazaro Arms [[id number]]    Assessment & Plan: 1. UTI (urinary tract infection) in pregnancy in third trimester   2. Musculoskeletal leg pain, right    Si/Sx c/w UTI E. Coli. Has BV per wet prep. Right lateral thigh hip pain seems MSK in nature. --Given flagyl for BV infection and macrobid for E. Coli UTI, sensitivities pending --Given instructions to work out lateral thigh for MSK source --Good [place] f/u by hx but poor medical clinic f/u, msg sent to clinic, pt to await call concerning OB f/u --Labor precautions and fetal kick counts reviewed --Discharge home in stable condition  Follow-up Information    [facility] Follow up.   Specialty:  Obstetrics and Gynecology Why:  will call you to schedule appointment as soon as possible.  Contact information: [address] [place] [address] [phone]       [facility] ADMISSIONS Follow up.    Why:  in emergencies Contact information: [address] [id number] mc [place] [address] [phone]          Allergies as of 03/24/2016      Reactions   Penicillins Anaphylaxis   Has patient had a PCN reaction causing immediate rash, facial/tongue/throat swelling, SOB or lightheadedness with hypotension: Yes Has patient had a PCN reaction causing severe rash involving mucus membranes or skin necrosis: Yes Has patient had a PCN reaction that required hospitalization Yes Has patient had a PCN reaction occurring within the last 10 years: No If all of the above answers are "NO", then may proceed with Cephalosporin use.   Latex Itching, Rash      Medication List    TAKE these medications   acetaminophen 325 MG tablet Commonly known as:  TYLENOL Take 650 mg by mouth every 6 (six) hours as needed for mild pain or headache.   cetirizine 10 MG tablet Commonly known as:  ZYRTEC Take 1 tablet (10 mg total) by mouth daily.   docusate sodium 100 MG capsule Commonly known as:  COLACE Take 1 capsule (100 mg total) by mouth 2 (two) times daily.   famotidine 20 MG tablet Commonly known as:  PEPCID Take 1 tablet (20 mg total) by mouth daily.   glycopyrrolate 2 MG tablet Commonly known as:  ROBINUL Take 1 tablet (2 mg total) by mouth 3 (three) times daily.   metroNIDAZOLE 500 MG tablet Commonly known as:  FLAGYL Take 1 tablet (500 mg total) by mouth 2 (two) times daily.   multivitamin-prenatal 27-0.8 MG Tabs tablet Take 1 tablet by mouth daily at 12 noon.   nitrofurantoin (macrocrystal-monohydrate) 100 MG capsule Commonly known as:  MACROBID Take 1 capsule (100 mg total) by mouth 2 (two) times daily.   pantoprazole 40 MG tablet Commonly known as:  PROTONIX Take 1 tablet (40 mg total) by mouth daily.   polyethylene glycol powder powder Commonly known as:  GLYCOLAX/MIRALAX Take 17 g by mouth 2 (two) times daily as needed.   promethazine 25 MG  tablet Commonly known as:  PHENERGAN Take 1 tablet (25  mg total) by mouth every 6 (six) hours as needed for nausea or vomiting.       Wendee Beavers, DO, PGY-1 03/24/2016, 12:10 AM  I was present for the exam and agree with above. Pt refuses repeat cervical exam by CNM. Denies contractions. Limited PNC. Needs ROB and GTT ASAP. Refuses 2 hour GTT, btu agrees to Jelly bean 1 hour GTT. Has instructions.   [place], CNM 03/24/2016 12:47 AM

## 2016-03-26 ENCOUNTER — Other Ambulatory Visit: Payer: Self-pay | Admitting: Advanced Practice Midwife

## 2016-03-26 DIAGNOSIS — O2343 Unspecified infection of urinary tract in pregnancy, third trimester: Secondary | ICD-10-CM

## 2016-03-26 DIAGNOSIS — O2340 Unspecified infection of urinary tract in pregnancy, unspecified trimester: Secondary | ICD-10-CM | POA: Insufficient documentation

## 2016-03-26 LAB — CULTURE, OB URINE
Culture: >=100000 — AB
Special Requests: NORMAL

## 2016-03-26 MED ORDER — CEPHALEXIN 500 MG PO CAPS: 500.0000 mg | ORAL_CAPSULE | Freq: Four times a day (QID) | ORAL | 3 refills | Status: DC

## 2016-03-26 NOTE — Progress Notes (Signed)
Please inform pt that we need to change her ABX based on culture results. She needs to take Keflex QID x 7 days and then once daily for UTI suppression until the end of the pregnancy. (Has taken cephalosporins before.)

## 2016-03-27 ENCOUNTER — Ambulatory Visit (HOSPITAL_COMMUNITY): Admission: RE | Admit: 2016-03-27 | Payer: Medicaid Other | Source: Ambulatory Visit

## 2016-04-03 ENCOUNTER — Encounter (HOSPITAL_COMMUNITY): Payer: Self-pay

## 2016-04-03 ENCOUNTER — Ambulatory Visit (HOSPITAL_COMMUNITY)
Admission: RE | Admit: 2016-04-03 | Discharge: 2016-04-03 | Disposition: A | Discharge status: Home / Self Care | Payer: Medicaid Other | Source: Ambulatory Visit | Attending: Family Medicine | Admitting: Family Medicine

## 2016-04-03 DIAGNOSIS — O99323 Drug use complicating pregnancy, third trimester: Secondary | ICD-10-CM | POA: Insufficient documentation

## 2016-04-03 DIAGNOSIS — Z3A34 34 weeks gestation of pregnancy: Secondary | ICD-10-CM | POA: Insufficient documentation

## 2016-04-03 DIAGNOSIS — O365930 Maternal care for other known or suspected poor fetal growth, third trimester, not applicable or unspecified: Secondary | ICD-10-CM | POA: Insufficient documentation

## 2016-04-03 NOTE — Telephone Encounter (Signed)
Opened in error

## 2016-04-04 ENCOUNTER — Encounter: Payer: Self-pay | Admitting: *Deleted

## 2016-04-06 ENCOUNTER — Encounter (HOSPITAL_COMMUNITY): Payer: Self-pay

## 2016-04-06 ENCOUNTER — Inpatient Hospital Stay (HOSPITAL_COMMUNITY)
Admission: AD | Admit: 2016-04-06 | Discharge: 2016-04-06 | Disposition: A | Discharge status: Home / Self Care | Payer: Medicaid Other | Source: Ambulatory Visit | Attending: Obstetrics & Gynecology | Admitting: Obstetrics & Gynecology

## 2016-04-06 DIAGNOSIS — Z880 Allergy status to penicillin: Secondary | ICD-10-CM | POA: Diagnosis not present

## 2016-04-06 DIAGNOSIS — O98813 Other maternal infectious and parasitic diseases complicating pregnancy, third trimester: Secondary | ICD-10-CM | POA: Diagnosis not present

## 2016-04-06 DIAGNOSIS — B3731 Acute candidiasis of vulva and vagina: Secondary | ICD-10-CM

## 2016-04-06 DIAGNOSIS — O9989 Other specified diseases and conditions complicating pregnancy, childbirth and the puerperium: Secondary | ICD-10-CM | POA: Diagnosis not present

## 2016-04-06 DIAGNOSIS — O99333 Smoking (tobacco) complicating pregnancy, third trimester: Secondary | ICD-10-CM | POA: Insufficient documentation

## 2016-04-06 DIAGNOSIS — Z3A34 34 weeks gestation of pregnancy: Secondary | ICD-10-CM | POA: Insufficient documentation

## 2016-04-06 DIAGNOSIS — B373 Candidiasis of vulva and vagina: Secondary | ICD-10-CM | POA: Diagnosis not present

## 2016-04-06 LAB — URINALYSIS, ROUTINE W REFLEX MICROSCOPIC
Bilirubin Urine: NEGATIVE
Glucose, UA: NEGATIVE mg/dL
Ketones, ur: NEGATIVE mg/dL
Nitrite: NEGATIVE
Protein, ur: 30 mg/dL — AB
Specific Gravity, Urine: 1.016 (ref 1.005–1.030)
pH: 6 (ref 5.0–8.0)

## 2016-04-06 LAB — WET PREP, GENITAL
Sperm: NONE SEEN
Trich, Wet Prep: NONE SEEN

## 2016-04-06 MED ORDER — FLUCONAZOLE 150 MG PO TABS: 150.0000 mg | ORAL_TABLET | Freq: Once | ORAL | 0 refills | Status: AC

## 2016-04-06 NOTE — MAU Note (Addendum)
Pt c/o vaginal swelling, irritating and discharge x2 days. States she was recently treated for BV. Thinks she may have a yeast infection. Denies vaginal bleeding or contractions. +FM

## 2016-04-06 NOTE — Progress Notes (Addendum)
G8P3 @ 34.[redacted] wksga. Presents to triage for vaginal itching.  +FM. EFM applied. VSS see flow sheet for details. Denies LOF or bleeding.   2100: provider at bs assessing pt. Wet prep, CG done.   2110: D/c EFM per provider's orders.   2159: discharge instructions given with pt understanding. Pt left unit via ambulatory.

## 2016-04-06 NOTE — Discharge Instructions (Signed)

## 2016-04-06 NOTE — MAU Provider Note (Signed)
History     CSN: [id number]  Arrival date and time: 04/06/16 2022   Chief Complaint  Patient presents with  . Vaginal Itching  . Vaginal Discharge  . Groin Swelling   [id number] @34 .3 here with vaginal swelling, itching and discharge x2 days. Describes as thick white. No recent IC. Reports good FM. No VB, LOF, or ctx. Taking for Keflex for recurrent UTI and Flagyl for BV.    OB History    Gravida Para Term Preterm AB Living   8 3 3  0 4 3   SAB TAB Ectopic Multiple Live Births   1 3 0 0 3      Past Medical History:  Diagnosis Date  . Chlamydia   . Hypertension    During second pregnancy  . Mental disorder 2010   bipolar  . Pneumonia   . Trichimoniasis     Past Surgical History:  Procedure Laterality Date  . INDUCED ABORTION    . MOUTH SURGERY     as a child  . WISDOM TOOTH EXTRACTION      Family History  Problem Relation Age of Onset  . Hypertension Mother   . Cancer Mother   . Fibroids Mother   . ADD / ADHD Brother   . Hypertension Maternal Grandmother   . Anesthesia problems Neg Hx     Social History  Substance Use Topics  . Smoking status: Current Some Day Smoker    Types: Cigarettes    Last attempt to quit: 04/04/2010  . Smokeless tobacco: Never Used  . Alcohol use No     Comment: quit when found out was pregnant    Allergies:  Allergies  Allergen Reactions  . Penicillins Anaphylaxis    Has taken cephalosprorins Has patient had a PCN reaction causing immediate rash, facial/tongue/throat swelling, SOB or lightheadedness with hypotension: Yes Has patient had a PCN reaction causing severe rash involving mucus membranes or skin necrosis: Yes Has patient had a PCN reaction that required hospitalization Yes Has patient had a PCN reaction occurring within the last 10 years: No If all of the above answers are "NO", then may proceed with Cephalosporin use.  . Latex Itching and Rash    Facility-Administered Medications Prior to Admission  Medication  Dose Route Frequency Provider Last Rate Last Dose  . prenatal vitamin w/FE, FA (NATACHEW) chewable tablet 1 tablet  1 tablet Oral Q1200 Adam Phenix, MD       Prescriptions Prior to Admission  Medication Sig Dispense Refill Last Dose  . acetaminophen (TYLENOL) 325 MG tablet Take 650 mg by mouth every 6 (six) hours as needed for mild pain or headache.   Taking  . cephALEXin (KEFLEX) 500 MG capsule Take 1 capsule (500 mg total) by mouth 4 (four) times daily. Keflex QID x 7 days, then once daily for UTI suppression. (Patient not taking: Reported on 04/03/2016) 28 capsule 3 Not Taking  . cetirizine (ZYRTEC) 10 MG tablet Take 1 tablet (10 mg total) by mouth daily. (Patient not taking: Reported on 01/18/2016) 14 tablet 1 Not Taking  . docusate sodium (COLACE) 100 MG capsule Take 1 capsule (100 mg total) by mouth 2 (two) times daily. (Patient not taking: Reported on 01/18/2016) 30 capsule 0 Not Taking  . famotidine (PEPCID) 20 MG tablet Take 1 tablet (20 mg total) by mouth daily. (Patient not taking: Reported on 01/18/2016) 30 tablet 1 Not Taking  . glycopyrrolate (ROBINUL) 2 MG tablet Take 1 tablet (2 mg total) by mouth 3 (  three) times daily. (Patient not taking: Reported on 01/18/2016) 30 tablet 3 Not Taking  . metroNIDAZOLE (FLAGYL) 500 MG tablet Take 1 tablet (500 mg total) by mouth 2 (two) times daily. (Patient not taking: Reported on 04/03/2016) 14 tablet 0 Not Taking  . Nitrofurantoin Monohyd Macro (MACROBID PO) Take by mouth.   Taking  . pantoprazole (PROTONIX) 40 MG tablet Take 1 tablet (40 mg total) by mouth daily. (Patient not taking: Reported on 02/21/2016) 30 tablet 2 Not Taking  . polyethylene glycol powder (GLYCOLAX/MIRALAX) powder Take 17 g by mouth 2 (two) times daily as needed. (Patient not taking: Reported on 01/18/2016) 3350 g 1 Not Taking  . Prenatal Vit-Fe Fumarate-FA (MULTIVITAMIN-PRENATAL) 27-0.8 MG TABS tablet Take 1 tablet by mouth daily at 12 noon. (Patient not taking: Reported on 03/13/2016) 30  each 11 Not Taking  . promethazine (PHENERGAN) 25 MG tablet Take 1 tablet (25 mg total) by mouth every 6 (six) hours as needed for nausea or vomiting. (Patient not taking: Reported on 01/18/2016) 30 tablet 1 Not Taking    Review of Systems  Gastrointestinal: Negative for abdominal pain.  Genitourinary: Positive for vaginal discharge and vaginal pain. Negative for vaginal bleeding.   Physical Exam   Blood pressure 108/65, pulse (!) 114, temperature 98.8 F (37.1 C), temperature source Oral, resp. rate 18, last menstrual period 08/09/2015.  Physical Exam  Nursing note and vitals reviewed. Constitutional: She is oriented to person, place, and time. She appears well-developed and well-nourished. No distress.  HENT:  Head: Normocephalic and atraumatic.  Neck: Normal range of motion.  Respiratory: Effort normal.  GI: Soft. She exhibits no distension. There is no tenderness.  gravid  Genitourinary:  Genitourinary Comments: External: vulva erythematous and excoriated extending to perianal area Vagina: rugated, parous, copious thick curdy discharge adherent to walls   Musculoskeletal: Normal range of motion.  Neurological: She is alert and oriented to person, place, and time.  Skin: Skin is warm and dry.  Psychiatric: She has a normal mood and affect.   EFM: 135 bpm, mod variability, + accels, variable x1 Toco: irritability  Results for orders placed or performed during the hospital encounter of 04/06/16 (from the past 24 hour(s))  Urinalysis, Routine w reflex microscopic     Status: Abnormal   Collection Time: 04/06/16  8:25 PM  Result Value Ref Range   Color, Urine YELLOW YELLOW   APPearance CLOUDY (A) CLEAR   Specific Gravity, Urine 1.016 1.005 - 1.030   pH 6.0 5.0 - 8.0   Glucose, UA NEGATIVE NEGATIVE mg/dL   Hgb urine dipstick SMALL (A) NEGATIVE   Bilirubin Urine NEGATIVE NEGATIVE   Ketones, ur NEGATIVE NEGATIVE mg/dL   Protein, ur 30 (A) NEGATIVE mg/dL   Nitrite NEGATIVE  NEGATIVE   Leukocytes, UA LARGE (A) NEGATIVE   RBC / HPF TOO NUMEROUS TO COUNT 0 - 5 RBC/hpf   WBC, UA TOO NUMEROUS TO COUNT 0 - 5 WBC/hpf   Bacteria, UA FEW (A) NONE SEEN   Squamous Epithelial / LPF TOO NUMEROUS TO COUNT (A) NONE SEEN   Mucous PRESENT   Wet prep, genital     Status: Abnormal   Collection Time: 04/06/16  9:05 PM  Result Value Ref Range   Yeast Wet Prep HPF POC PRESENT (A) NONE SEEN   Trich, Wet Prep NONE SEEN NONE SEEN   Clue Cells Wet Prep HPF POC PRESENT (A) NONE SEEN   WBC, Wet Prep HPF POC MANY (A) NONE SEEN   Sperm NONE  SEEN     MAU Course  Procedures  MDM Labs ordered and reviewed. Will treat for yeast. Cultures pending. Continue Flagyl for BV and Keflex. Tub soaks with baking soda. Stable for discharge home.  Assessment and Plan   1. [redacted] weeks gestation of pregnancy   2. Yeast vaginitis    Discharge home Follow up this week for PN appt and US as scheduled Notify MD of worsening sx  Allergies as of 04/06/2016      Reactions   Penicillins Anaphylaxis   Has taken cephalosprorins Has patient had a PCN reaction causing immediate rash, facial/tongue/throat swelling, SOB or lightheadedness with hypotension: Yes Has patient had a PCN reaction causing severe rash involving mucus membranes or skin necrosis: Yes Has patient had a PCN reaction that required hospitalization Yes Has patient had a PCN reaction occurring within the last 10 years: No If all of the above answers are "NO", then may proceed with Cephalosporin use.   Latex Itching, Rash      Medication List    STOP taking these medications   glycopyrrolate 2 MG tablet Commonly known as:  ROBINUL   MACROBID PO   pantoprazole 40 MG tablet Commonly known as:  PROTONIX   promethazine 25 MG tablet Commonly known as:  PHENERGAN     TAKE these medications   acetaminophen 325 MG tablet Commonly known as:  TYLENOL Take 650 mg by mouth every 6 (six) hours as needed for mild pain or headache.    cephALEXin 500 MG capsule Commonly known as:  KEFLEX Take 1 capsule (500 mg total) by mouth 4 (four) times daily. Keflex QID x 7 days, then once daily for UTI suppression.   cetirizine 10 MG tablet Commonly known as:  ZYRTEC Take 1 tablet (10 mg total) by mouth daily.   docusate sodium 100 MG capsule Commonly known as:  COLACE Take 1 capsule (100 mg total) by mouth 2 (two) times daily.   famotidine 20 MG tablet Commonly known as:  PEPCID Take 1 tablet (20 mg total) by mouth daily.   fluconazole 150 MG tablet Commonly known as:  DIFLUCAN Take 1 tablet (150 mg total) by mouth once.   metroNIDAZOLE 500 MG tablet Commonly known as:  FLAGYL Take 1 tablet (500 mg total) by mouth 2 (two) times daily.   multivitamin-prenatal 27-0.8 MG Tabs tablet Take 1 tablet by mouth daily at 12 noon.   polyethylene glycol powder powder Commonly known as:  GLYCOLAX/MIRALAX Take 17 g by mouth 2 (two) times daily as needed.      Donette LarryMelanie Laynee Lockamy, CNM 04/06/2016, 9:48 PM

## 2016-04-07 LAB — GC/CHLAMYDIA PROBE AMP (~~LOC~~) NOT AT ARMC
Chlamydia: NEGATIVE
Neisseria Gonorrhea: NEGATIVE

## 2016-04-10 ENCOUNTER — Ambulatory Visit (HOSPITAL_COMMUNITY): Admission: RE | Admit: 2016-04-10 | Payer: Medicaid Other | Source: Ambulatory Visit

## 2016-04-10 ENCOUNTER — Encounter: Payer: Self-pay | Admitting: Obstetrics and Gynecology

## 2016-04-10 NOTE — BH Specialist Note (Deleted)
Integrated Behavioral Health Initial Visit  MRN: [id number] Name: Rachel Lloyd   Session Start time: *** Session End time: *** Total time: {IBH Total Time:21014050}  Type of Service: Integrated Behavioral Health- Individual/Family Interpretor:No. Interpretor Name and Language: n/a   Warm Hand Off Completed.       SUBJECTIVE: Rachel Lloyd is a 31 y.o. female accompanied by patient. Patient was referred by Dr Alysia PennaErvin for bipolar, homelessness, substance. Patient reports the following symptoms/concerns: *** Duration of problem: ***; Severity of problem: {Mild/Moderate/Severe:20260}  OBJECTIVE: Mood: {BHH MOOD:22306} and Affect: {BHH AFFECT:22307} Risk of harm to self or others: {CHL AMB BH Suicide Current Mental Status:21022748}   LIFE CONTEXT: Family and Social: *** School/Work: *** Self-Care: *** Life Changes: ***  GOALS ADDRESSED: Patient will reduce symptoms of: {IBH Symptoms:21014056} and increase knowledge and/or ability of: {IBH Patient Tools:21014057} and also: {IBH Goals:21014053}   INTERVENTIONS: {IBH Interventions:21014054}  Standardized Assessments completed: {IBH Screening Tools:21014051}  ASSESSMENT: Patient currently experiencing ***. Patient may benefit from ***.  PLAN: 1. Follow up with behavioral health clinician on : *** 2. Behavioral recommendations:  -*** -*** -*** -Read educational material regarding coping with symptoms of anxiety and depression *** -Consider using Postpartum Planner *** 3. Referral(s): {IBH Referrals:21014055} 4. "From scale of 1-10, how likely are you to follow plan?": ***  Jamie C McMannes, LCSWA

## 2016-04-11 ENCOUNTER — Other Ambulatory Visit (HOSPITAL_COMMUNITY): Payer: Self-pay | Admitting: *Deleted

## 2016-04-11 ENCOUNTER — Encounter (HOSPITAL_COMMUNITY): Payer: Self-pay

## 2016-04-11 ENCOUNTER — Ambulatory Visit (HOSPITAL_COMMUNITY)
Admission: RE | Admit: 2016-04-11 | Discharge: 2016-04-11 | Disposition: A | Discharge status: Home / Self Care | Payer: Medicaid Other | Source: Ambulatory Visit | Attending: Family Medicine | Admitting: Family Medicine

## 2016-04-11 DIAGNOSIS — O99323 Drug use complicating pregnancy, third trimester: Secondary | ICD-10-CM | POA: Insufficient documentation

## 2016-04-11 DIAGNOSIS — O365930 Maternal care for other known or suspected poor fetal growth, third trimester, not applicable or unspecified: Secondary | ICD-10-CM | POA: Diagnosis not present

## 2016-04-11 DIAGNOSIS — Z3A35 35 weeks gestation of pregnancy: Secondary | ICD-10-CM | POA: Diagnosis not present

## 2016-04-17 ENCOUNTER — Ambulatory Visit (HOSPITAL_COMMUNITY)
Admission: RE | Admit: 2016-04-17 | Discharge: 2016-04-17 | Disposition: A | Discharge status: Home / Self Care | Payer: Medicaid Other | Source: Ambulatory Visit | Attending: Family Medicine | Admitting: Family Medicine

## 2016-04-17 ENCOUNTER — Encounter (HOSPITAL_COMMUNITY): Payer: Self-pay

## 2016-04-17 ENCOUNTER — Other Ambulatory Visit (HOSPITAL_COMMUNITY): Payer: Self-pay | Admitting: Maternal and Fetal Medicine

## 2016-04-17 DIAGNOSIS — O365930 Maternal care for other known or suspected poor fetal growth, third trimester, not applicable or unspecified: Secondary | ICD-10-CM

## 2016-04-17 DIAGNOSIS — Z3A36 36 weeks gestation of pregnancy: Secondary | ICD-10-CM

## 2016-04-17 NOTE — ED Notes (Addendum)
Pt reports passing out 2 days ago while riding in the car.  Has had SOB since the episode.  Pulse ox 99%.

## 2016-04-18 ENCOUNTER — Ambulatory Visit: Payer: Medicaid Other | Admitting: Clinical

## 2016-04-18 ENCOUNTER — Ambulatory Visit (INDEPENDENT_AMBULATORY_CARE_PROVIDER_SITE_OTHER): Payer: Medicaid Other | Admitting: Obstetrics & Gynecology

## 2016-04-18 VITALS — BP 107/68 | HR 99 | Wt 144.6 lb

## 2016-04-18 DIAGNOSIS — R69 Illness, unspecified: Secondary | ICD-10-CM

## 2016-04-18 DIAGNOSIS — O0973 Supervision of high risk pregnancy due to social problems, third trimester: Secondary | ICD-10-CM

## 2016-04-18 NOTE — Progress Notes (Signed)
   PRENATAL VISIT NOTE  Subjective:  Rachel Lloyd is a 31 y.o. [id number] at [redacted]w[redacted]d being seen today for ongoing prenatal care.  She is currently monitored for the following issues for this high-risk pregnancy and has Bipolar disorder (HCC); Hypertension; Abscess of axilla, right; Hx of induced abortion; MDD (major depressive disorder), recurrent severe, without psychosis (HCC); Low TSH level; Pregnancy affected by fetal growth restriction; Group beta Strep positive; Supervision of high risk pregnancy, antepartum, third trimester; Substance abuse affecting pregnancy, antepartum; and Recurrent UTI (urinary tract infection) complicating pregnancy on her problem list.  Patient reports no complaints.  Contractions: Irregular. Vag. Bleeding: None.  Movement: Present. Denies leaking of fluid.   The following portions of the patient's history were reviewed and updated as appropriate: allergies, current medications, past family history, past medical history, past social history, past surgical history and problem list. Problem list updated.  Objective:   Vitals:   04/18/16 0808  BP: 107/68  Pulse: 99  Weight: 144 lb 9.6 oz (65.6 kg)    Fetal Status: Fetal Heart Rate (bpm): 140   Movement: Present     General:  Alert, oriented and cooperative. Patient is in no acute distress.  Skin: Skin is warm and dry. No rash noted.   Cardiovascular: Normal heart rate noted  Respiratory: Normal respiratory effort, no problems with respiration noted  Abdomen: Soft, gravid, appropriate for gestational age. Pain/Pressure: Absent     Pelvic:  Cervical exam deferred        Extremities: Normal range of motion.  Edema: None  Mental Status: Normal mood and affect. Normal behavior. Normal judgment and thought content.   Assessment and Plan:  Pregnancy: [id number] at [redacted]w[redacted]d  1. Supervision of high risk pregnancy due to social problems in third trimester  GBS pos urine culture  Preterm labor symptoms and general  obstetric precautions including but not limited to vaginal bleeding, contractions, leaking of fluid and fetal movement were reviewed in detail with the patient. Please refer to After Visit Summary for other counseling recommendations.  Return in about 1 week (around 04/25/2016). Sign Medicaid papers  Adam Phenix, MD

## 2016-04-18 NOTE — Progress Notes (Signed)
BTL papers signed.  

## 2016-04-18 NOTE — Patient Instructions (Signed)
Labor Induction Labor induction is when steps are taken to cause a pregnant woman to begin the labor process. Most women go into labor on their own between 37 weeks and 42 weeks of the pregnancy. When this does not happen or when there is a medical need, methods may be used to induce labor. Labor induction causes a pregnant woman's uterus to contract. It also causes the cervix to soften (ripen), open (dilate), and thin out (efface). Usually, labor is not induced before 39 weeks of the pregnancy unless there is a problem with the baby or mother. Before inducing labor, your health care provider will consider a number of factors, including the following:  The medical condition of you and the baby.  How many weeks along you are.  The status of the baby's lung maturity.  The condition of the cervix.  The position of the baby. What are the reasons for labor induction? Labor may be induced for the following reasons:  The health of the baby or mother is at risk.  The pregnancy is overdue by 1 week or more.  The water breaks but labor does not start on its own.  The mother has a health condition or serious illness, such as high blood pressure, infection, placental abruption, or diabetes.  The amniotic fluid amounts are low around the baby.  The baby is distressed. Convenience or wanting the baby to be born on a certain date is not a reason for inducing labor. What methods are used for labor induction? Several methods of labor induction may be used, such as:  Prostaglandin medicine. This medicine causes the cervix to dilate and ripen. The medicine will also start contractions. It can be taken by mouth or by inserting a suppository into the vagina.  Inserting a thin tube (catheter) with a balloon on the end into the vagina to dilate the cervix. Once inserted, the balloon is expanded with water, which causes the cervix to open.  Stripping the membranes. Your health care provider separates  amniotic sac tissue from the cervix, causing the cervix to be stretched and causing the release of a hormone called progesterone. This may cause the uterus to contract. It is often done during an office visit. You will be sent home to wait for the contractions to begin. You will then come in for an induction.  Breaking the water. Your health care provider makes a hole in the amniotic sac using a small instrument. Once the amniotic sac breaks, contractions should begin. This may still take hours to see an effect.  Medicine to trigger or strengthen contractions. This medicine is given through an IV access tube inserted into a vein in your arm. All of the methods of induction, besides stripping the membranes, will be done in the hospital. Induction is done in the hospital so that you and the baby can be carefully monitored. How long does it take for labor to be induced? Some inductions can take up to 2-3 days. Depending on the cervix, it usually takes less time. It takes longer when you are induced early in the pregnancy or if this is your first pregnancy. If a mother is still pregnant and the induction has been going on for 2-3 days, either the mother will be sent home or a cesarean delivery will be needed. What are the risks associated with labor induction? Some of the risks of induction include:  Changes in fetal heart rate, such as too high, too low, or erratic.  Fetal distress.    Chance of infection for the mother and baby.  Increased chance of having a cesarean delivery.  Breaking off (abruption) of the placenta from the uterus (rare).  Uterine rupture (very rare). When induction is needed for medical reasons, the benefits of induction may outweigh the risks. What are some reasons for not inducing labor? Labor induction should not be done if:  It is shown that your baby does not tolerate labor.  You have had previous surgeries on your uterus, such as a myomectomy or the removal of  fibroids.  Your placenta lies very low in the uterus and blocks the opening of the cervix (placenta previa).  Your baby is not in a head-down position.  The umbilical cord drops down into the birth canal in front of the baby. This could cut off the baby's blood and oxygen supply.  You have had a previous cesarean delivery.  There are unusual circumstances, such as the baby being extremely premature. This information is not intended to replace advice given to you by your health care provider. Make sure you discuss any questions you have with your health care provider. Document Released: 05/21/2006 Document Revised: 06/07/2015 Document Reviewed: 07/29/2012 Elsevier Interactive Patient Education  2017 [employer]  

## 2016-04-18 NOTE — BH Specialist Note (Signed)
Integrated Behavioral Health Initial Visit  MRN: [id number] Name: Rachel Lloyd   Session Start time: 9:10 Session End time: 9:15 Total time: 5 minutes  Type of Service: Integrated Behavioral Health- Individual/Family Interpretor:No. Interpretor Name and Language: n/a   Warm Hand Off Completed.       SUBJECTIVE: Rachel Lloyd is a 31 y.o. female accompanied by patient. Patient was referred by Dr Debroah Loop for Anxiety and depression. Patient reports the following symptoms/concerns: Pt states that she is already going to treatment for behavioral health and substances "3 or 4 times a week". Duration of problem: undetermined; Severity of problem: severe  OBJECTIVE: Mood: Appropriate and Affect: Appropriate Risk of harm to self or others: No plan to harm self or others   INTERVENTIONS:  Standardized Assessments completed: GAD-7 and PHQ 9  ASSESSMENT: Patient currently experiencing Diagnosis deferred.   PLAN: 1. Behavioral recommendations:  -Continue to attend routine behavioral health and substance treatment appointments 2. Referral(s): Integrated Behavioral Health Services (In Clinic) 3. "From scale of 1-10, how likely are you to follow plan?": 10  Rae Lips, LCSWA  Depression screen [facility] 2/9 02/18/2016 12/31/2015 11/22/2015 11/22/2015 11/08/2015  Decreased Interest 2 0 3 3 0  Down, Depressed, Hopeless 3 0 PHQ - 2 Score 5 0 Altered sleeping 3 - 3 - -  Tired, decreased energy 3 - 3 - -  Change in appetite 3 - 3 - -  Feeling bad or failure about yourself  3 - 3 - -  Trouble concentrating 3 - 3 - -  Moving slowly or fidgety/restless 3 - 3 - -  Suicidal thoughts 0 - 0 - -  PHQ-9 Score 23 - 24 - -  Difficult doing work/chores - - Extremely dIfficult - -  Some recent data might be hidden   GAD 7 : Generalized Anxiety Score 02/18/2016  Nervous, Anxious, on Edge 3  Control/stop worrying 3  Worry too much - different things 3  Trouble relaxing 3  Restless  3  Easily annoyed or irritable 3  Afraid - awful might happen 3  Total GAD 7 Score 21

## 2016-04-24 ENCOUNTER — Encounter (HOSPITAL_COMMUNITY): Payer: Self-pay

## 2016-04-24 ENCOUNTER — Ambulatory Visit (HOSPITAL_COMMUNITY)
Admission: RE | Admit: 2016-04-24 | Discharge: 2016-04-24 | Disposition: A | Discharge status: Home / Self Care | Payer: Medicaid Other | Source: Ambulatory Visit | Attending: Family Medicine | Admitting: Family Medicine

## 2016-04-24 DIAGNOSIS — Z3A37 37 weeks gestation of pregnancy: Secondary | ICD-10-CM | POA: Diagnosis not present

## 2016-04-24 DIAGNOSIS — O99323 Drug use complicating pregnancy, third trimester: Secondary | ICD-10-CM | POA: Insufficient documentation

## 2016-04-24 DIAGNOSIS — O365930 Maternal care for other known or suspected poor fetal growth, third trimester, not applicable or unspecified: Secondary | ICD-10-CM | POA: Diagnosis present

## 2016-04-27 ENCOUNTER — Encounter (HOSPITAL_COMMUNITY): Payer: Self-pay | Admitting: *Deleted

## 2016-04-27 ENCOUNTER — Inpatient Hospital Stay (HOSPITAL_COMMUNITY)
Admission: AD | Admit: 2016-04-27 | Discharge: 2016-04-27 | Disposition: A | Discharge status: Home / Self Care | Payer: Medicaid Other | Source: Ambulatory Visit | Attending: Obstetrics & Gynecology | Admitting: Obstetrics & Gynecology

## 2016-04-27 DIAGNOSIS — O471 False labor at or after 37 completed weeks of gestation: Secondary | ICD-10-CM | POA: Diagnosis not present

## 2016-04-27 DIAGNOSIS — Z3A37 37 weeks gestation of pregnancy: Secondary | ICD-10-CM | POA: Insufficient documentation

## 2016-04-27 IMAGING — US US RENAL
1 series · 14 of 25 positions shown · non-contrast
Comparison: None.

CLINICAL DATA: Lower abdominal pain and right flank pain for 3 days
in a pregnant patient.

EXAM:
RENAL / URINARY TRACT ULTRASOUND COMPLETE

[Series 1: us renal · 14 of 44 slices shown]
[im 1/44]
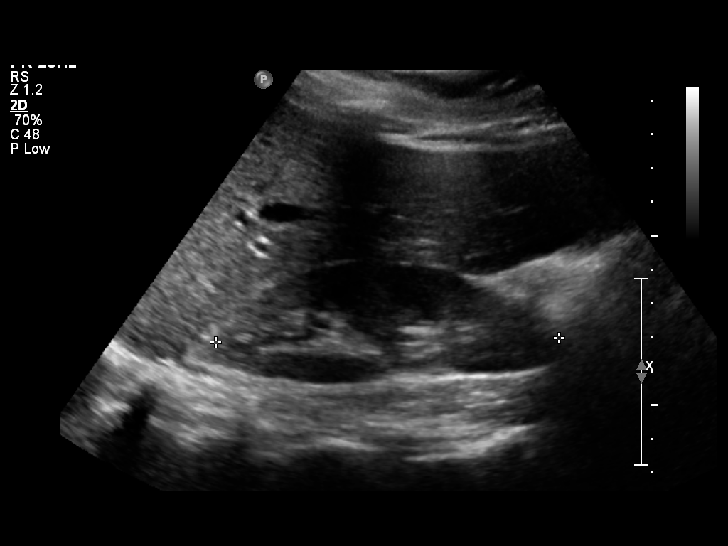
[im 4/44]
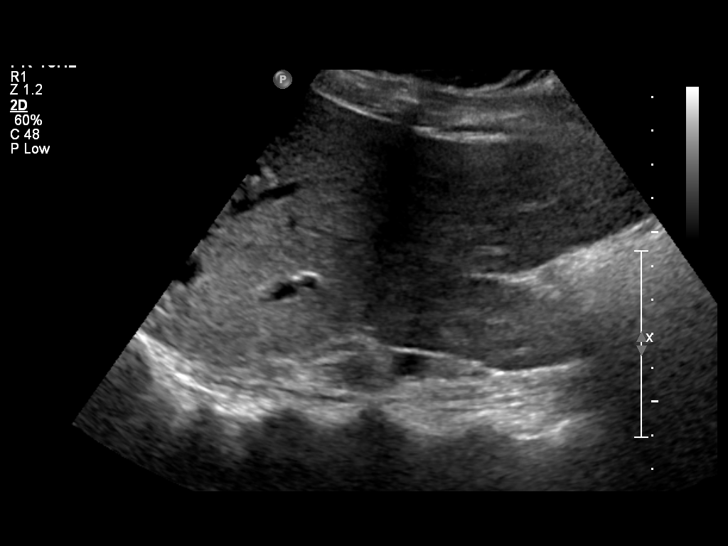
[im 8/44]
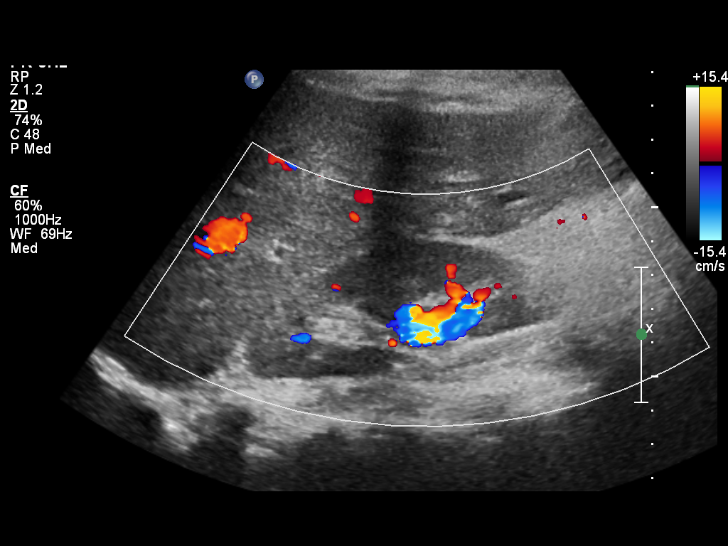
[im 11/44]
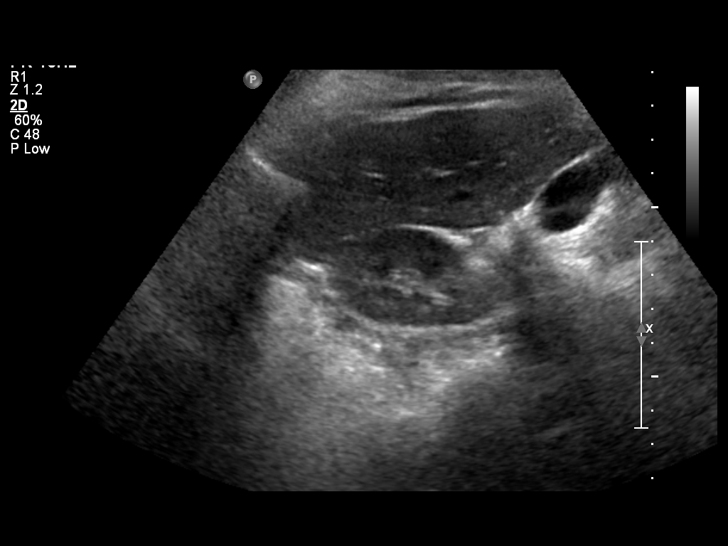
[im 15/44]
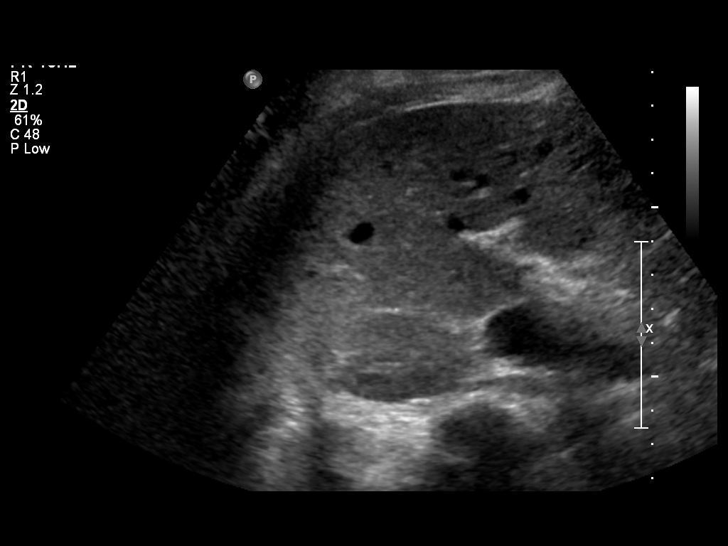
[im 17/44]
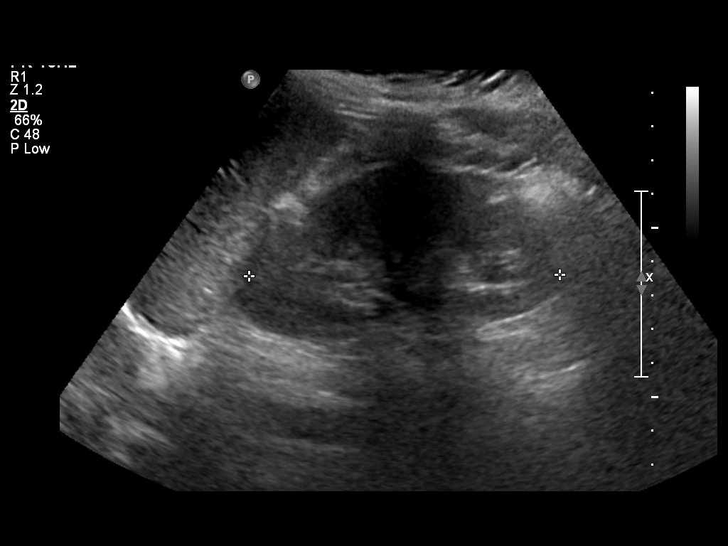
[im 20/44]
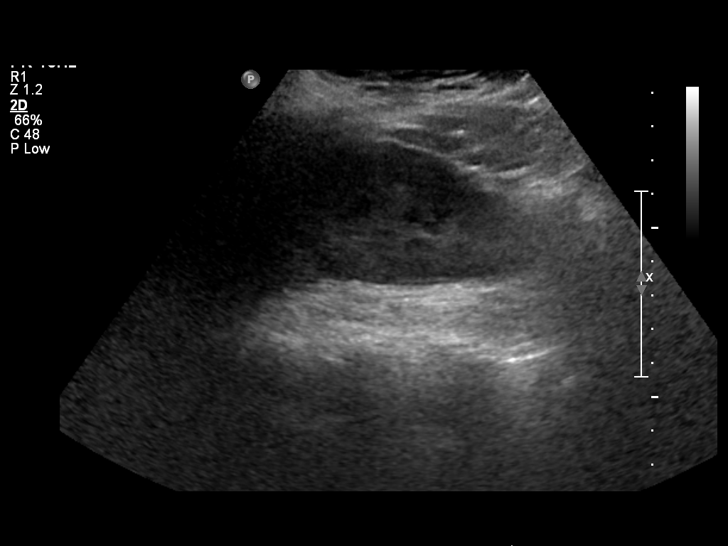
[im 24/44]
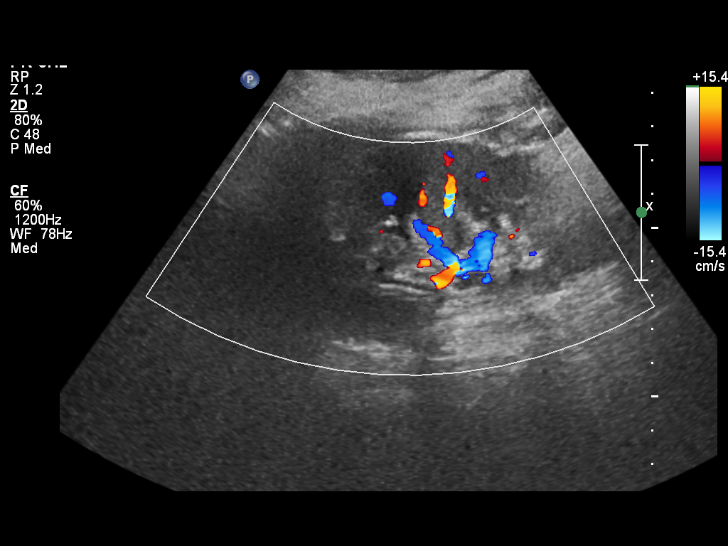
[im 27/44]
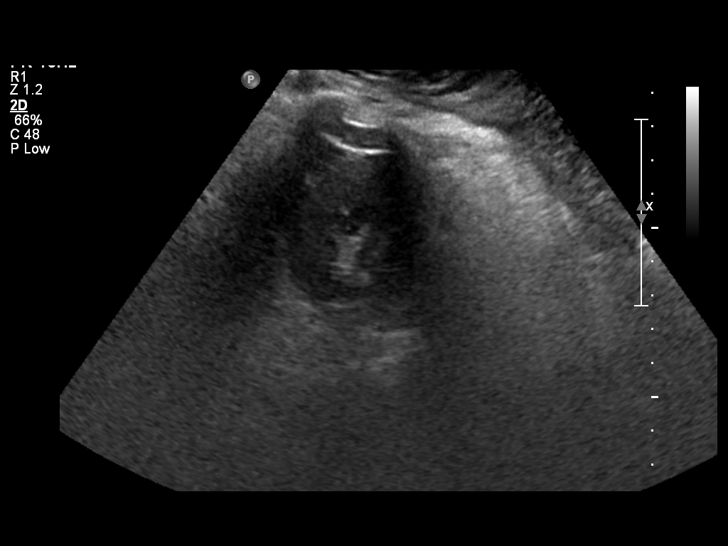
[im 29/44]
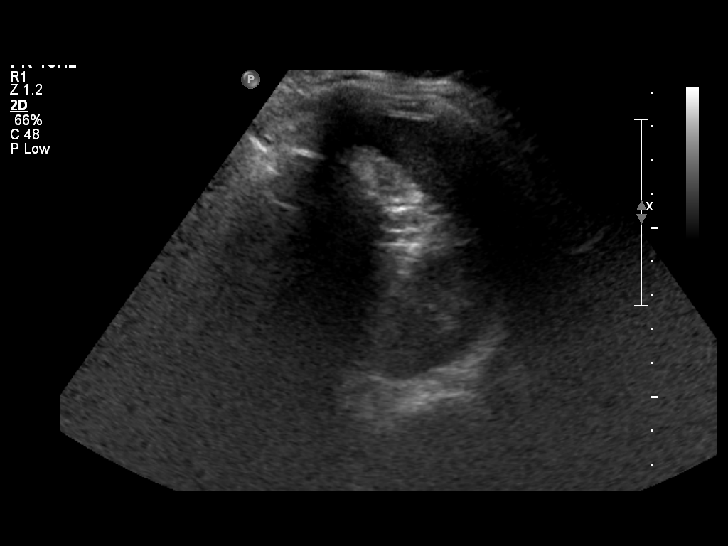
[im 33/44]
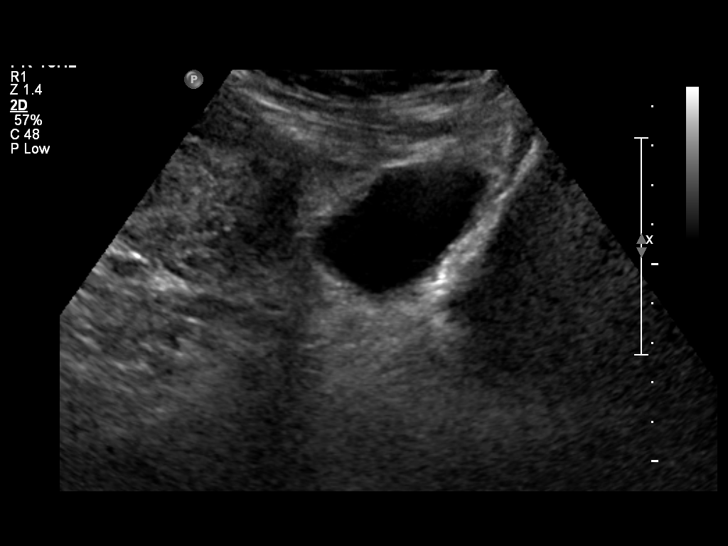
[im 36/44]
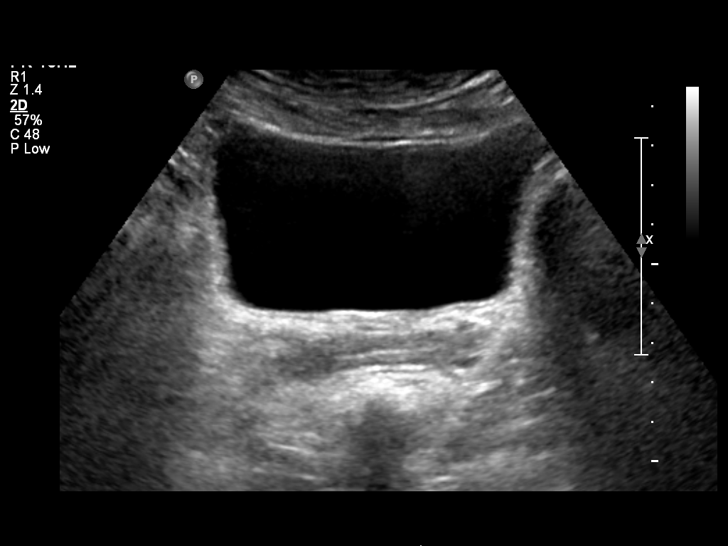
[im 40/44]
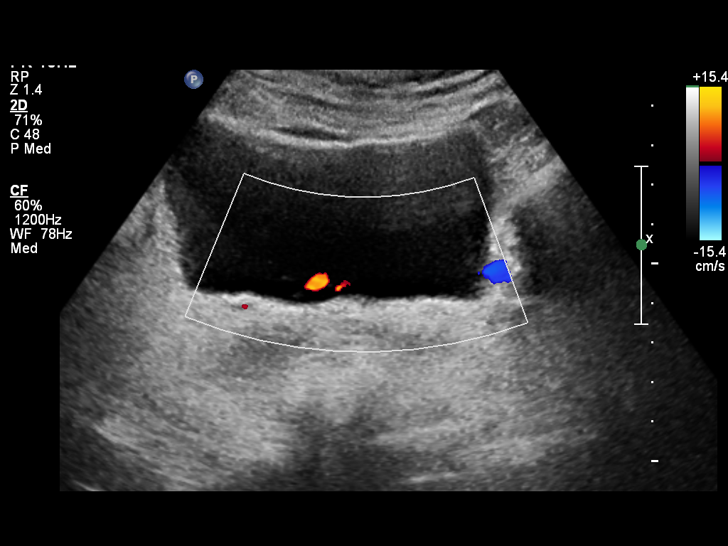
[im 44/44]
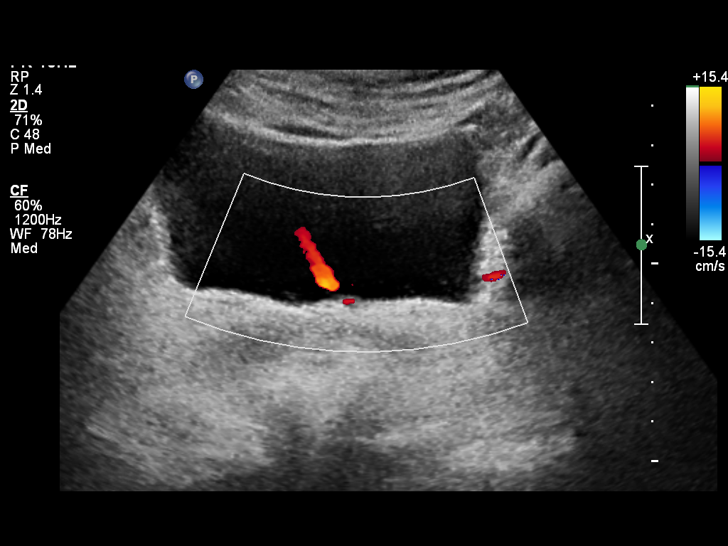

[14 of 25 positions shown; findings below may reference images not displayed]

FINDINGS: Right Kidney:

Length: 10.2 cm. Echogenicity within normal limits. No mass or
hydronephrosis visualized.

Left Kidney:

Length: 9.2 cm. Echogenicity within normal limits. No mass or
hydronephrosis.

Bladder:

Appears normal for degree of bladder distention.
IMPRESSION: Negative exam.

## 2016-04-27 NOTE — MAU Note (Signed)
Patient desires to go home and not have her cervix rechecked. Denies pain at this time.

## 2016-04-27 NOTE — MAU Note (Signed)
Pt presents to MAU with complaints of contractions that started about an hour ago while she was at church. Denies any vaginal bleeding or abnormal discharge

## 2016-04-28 ENCOUNTER — Ambulatory Visit (INDEPENDENT_AMBULATORY_CARE_PROVIDER_SITE_OTHER): Payer: Medicaid Other | Admitting: Family Medicine

## 2016-04-28 VITALS — BP 113/69 | HR 83 | Wt 149.3 lb

## 2016-04-28 DIAGNOSIS — Z1389 Encounter for screening for other disorder: Secondary | ICD-10-CM | POA: Diagnosis not present

## 2016-04-28 DIAGNOSIS — O365931 Maternal care for other known or suspected poor fetal growth, third trimester, fetus 1: Secondary | ICD-10-CM | POA: Diagnosis not present

## 2016-04-28 DIAGNOSIS — O365930 Maternal care for other known or suspected poor fetal growth, third trimester, not applicable or unspecified: Secondary | ICD-10-CM

## 2016-04-28 DIAGNOSIS — O2343 Unspecified infection of urinary tract in pregnancy, third trimester: Secondary | ICD-10-CM | POA: Diagnosis not present

## 2016-04-28 DIAGNOSIS — B951 Streptococcus, group B, as the cause of diseases classified elsewhere: Secondary | ICD-10-CM

## 2016-04-28 DIAGNOSIS — O0993 Supervision of high risk pregnancy, unspecified, third trimester: Secondary | ICD-10-CM | POA: Diagnosis present

## 2016-04-28 DIAGNOSIS — Z331 Pregnant state, incidental: Secondary | ICD-10-CM | POA: Diagnosis not present

## 2016-04-28 LAB — POCT URINALYSIS DIP (DEVICE)
Bilirubin Urine: NEGATIVE
Glucose, UA: NEGATIVE mg/dL
Hgb urine dipstick: NEGATIVE
Ketones, ur: NEGATIVE mg/dL
Nitrite: POSITIVE — AB
Protein, ur: NEGATIVE mg/dL
Specific Gravity, Urine: 1.015 (ref 1.005–1.030)
Urobilinogen, UA: 0.2 mg/dL (ref 0.0–1.0)
pH: 7 (ref 5.0–8.0)

## 2016-04-28 MED ORDER — CEPHALEXIN 500 MG PO CAPS: 500.0000 mg | ORAL_CAPSULE | Freq: Four times a day (QID) | ORAL | 3 refills | Status: DC

## 2016-04-28 NOTE — Progress Notes (Signed)
   PRENATAL VISIT NOTE  Subjective:  Rachel Lloyd is a 31 y.o. [phone] at [redacted]w[redacted]d being seen today for ongoing prenatal care.  She is currently monitored for the following issues for this high-risk pregnancy and has Bipolar disorder (HCC); Hypertension; Abscess of axilla, right; Hx of induced abortion; MDD (major depressive disorder), recurrent severe, without psychosis (HCC); Low TSH level; IUGR (intrauterine growth restriction) affecting care of mother; Group beta Strep positive; Supervision of high risk pregnancy, antepartum, third trimester; Substance abuse affecting pregnancy, antepartum; and Recurrent UTI (urinary tract infection) complicating pregnancy on her problem list.  Patient reports no complaints.   .  .  Movement: Present. Denies leaking of fluid.   The following portions of the patient's history were reviewed and updated as appropriate: allergies, current medications, past family history, past medical history, past social history, past surgical history and problem list. Problem list updated.  Objective:   Vitals:   04/28/16 1621  BP: 113/69  Pulse: 83  Weight: 149 lb 4.8 oz (67.7 kg)    Fetal Status: Fetal Heart Rate (bpm): 147 Fundal Height: 34 cm Movement: Present     General:  Alert, oriented and cooperative. Patient is in no acute distress.  Skin: Skin is warm and dry. No rash noted.   Cardiovascular: Normal heart rate noted  Respiratory: Normal respiratory effort, no problems with respiration noted  Abdomen: Soft, gravid, appropriate for gestational age. Pain/Pressure: Absent     Pelvic:  Cervical exam deferred        Extremities: Normal range of motion.  Edema: None  Mental Status: Normal mood and affect. Normal behavior. Normal judgment and thought content.   Assessment and Plan:  Pregnancy: [id number] at [redacted]w[redacted]d  1. Supervision of high risk pregnancy, antepartum, third trimester FHT and FH normal  2. Recurrent urinary tract infection affecting pregnancy in third  trimester UA shows UTI. Not taking prophylaxis. Prescribe keflex - tolerated in the past.  - cephALEXin (KEFLEX) 500 MG capsule; Take 1 capsule (500 mg total) by mouth 4 (four) times daily. Keflex QID x 7 days, then once daily for UTI suppression.  Dispense: 28 capsule; Refill: 3  3. Group beta Strep positive Will need intrapartum treatment  4. Poor fetal growth affecting management of mother in third trimester, single or unspecified fetus Continue testing. Induction scheduled for 39 weeks.  Term labor symptoms and general obstetric precautions including but not limited to vaginal bleeding, contractions, leaking of fluid and fetal movement were reviewed in detail with the patient. Please refer to After Visit Summary for other counseling recommendations.  No Follow-up on file.   Levie Heritage, DO

## 2016-05-01 ENCOUNTER — Ambulatory Visit (HOSPITAL_COMMUNITY)
Admission: RE | Admit: 2016-05-01 | Discharge: 2016-05-01 | Disposition: A | Discharge status: Home / Self Care | Payer: Medicaid Other | Source: Ambulatory Visit | Attending: Family Medicine | Admitting: Family Medicine

## 2016-05-01 ENCOUNTER — Encounter (HOSPITAL_COMMUNITY): Payer: Self-pay

## 2016-05-01 ENCOUNTER — Other Ambulatory Visit (HOSPITAL_COMMUNITY): Payer: Self-pay | Admitting: Maternal and Fetal Medicine

## 2016-05-01 DIAGNOSIS — O99323 Drug use complicating pregnancy, third trimester: Secondary | ICD-10-CM | POA: Insufficient documentation

## 2016-05-01 DIAGNOSIS — Z3A38 38 weeks gestation of pregnancy: Secondary | ICD-10-CM | POA: Insufficient documentation

## 2016-05-01 DIAGNOSIS — O365930 Maternal care for other known or suspected poor fetal growth, third trimester, not applicable or unspecified: Secondary | ICD-10-CM

## 2016-05-02 ENCOUNTER — Telehealth (HOSPITAL_COMMUNITY): Payer: Self-pay | Admitting: *Deleted

## 2016-05-02 ENCOUNTER — Encounter (HOSPITAL_COMMUNITY): Payer: Self-pay | Admitting: *Deleted

## 2016-05-02 NOTE — Telephone Encounter (Signed)
Preadmission screen  

## 2016-05-05 ENCOUNTER — Ambulatory Visit (INDEPENDENT_AMBULATORY_CARE_PROVIDER_SITE_OTHER): Payer: Medicaid Other | Admitting: Obstetrics and Gynecology

## 2016-05-05 VITALS — BP 121/78 | HR 90 | Wt 151.0 lb

## 2016-05-05 DIAGNOSIS — N898 Other specified noninflammatory disorders of vagina: Secondary | ICD-10-CM

## 2016-05-05 DIAGNOSIS — O26893 Other specified pregnancy related conditions, third trimester: Secondary | ICD-10-CM

## 2016-05-05 DIAGNOSIS — O365930 Maternal care for other known or suspected poor fetal growth, third trimester, not applicable or unspecified: Secondary | ICD-10-CM

## 2016-05-05 DIAGNOSIS — O0993 Supervision of high risk pregnancy, unspecified, third trimester: Secondary | ICD-10-CM

## 2016-05-05 DIAGNOSIS — O2343 Unspecified infection of urinary tract in pregnancy, third trimester: Secondary | ICD-10-CM

## 2016-05-05 DIAGNOSIS — O329XX0 Maternal care for malpresentation of fetus, unspecified, not applicable or unspecified: Secondary | ICD-10-CM | POA: Insufficient documentation

## 2016-05-05 DIAGNOSIS — Z3009 Encounter for other general counseling and advice on contraception: Secondary | ICD-10-CM | POA: Insufficient documentation

## 2016-05-05 MED ORDER — METRONIDAZOLE 250 MG PO TABS: 250.0000 mg | ORAL_TABLET | Freq: Three times a day (TID) | ORAL | 0 refills | Status: DC

## 2016-05-05 NOTE — Progress Notes (Signed)
Patient is taking Cephalexin for UTI, and patient states that it makes her itch and have a discharge and her legs raw.

## 2016-05-05 NOTE — Patient Instructions (Signed)

## 2016-05-05 NOTE — Progress Notes (Signed)
Subjective:  Rachel Lloyd is a 31 y.o. [id number] at [redacted]w[redacted]d being seen today for ongoing prenatal care.  She is currently monitored for the following issues for this high-risk pregnancy and has Bipolar disorder (HCC); Hypertension; Abscess of axilla, right; Hx of induced abortion; MDD (major depressive disorder), recurrent severe, without psychosis (HCC); Low TSH level; IUGR (intrauterine growth restriction) affecting care of mother; Supervision of high risk pregnancy, antepartum, third trimester; Substance abuse affecting pregnancy, antepartum; Recurrent UTI (urinary tract infection) complicating pregnancy; Malpresentation before onset of labor; and Unwanted fertility on her problem list.  Patient reports vaginal discharge.  Contractions: Irregular. Vag. Bleeding: None.  Movement: Present. Denies leaking of fluid.   The following portions of the patient's history were reviewed and updated as appropriate: allergies, current medications, past family history, past medical history, past social history, past surgical history and problem list. Problem list updated.  Objective:   Vitals:   05/05/16 1345  BP: 121/78  Pulse: 90  Weight: 68.5 kg (151 lb)    Fetal Status: Fetal Heart Rate (bpm): 145   Movement: Present     General:  Alert, oriented and cooperative. Patient is in no acute distress.  Skin: Skin is warm and dry. No rash noted.   Cardiovascular: Normal heart rate noted  Respiratory: Normal respiratory effort, no problems with respiration noted  Abdomen: Soft, gravid, appropriate for gestational age. Pain/Pressure: Present     Pelvic:  Cervical exam performed        Extremities: Normal range of motion.  Edema: Trace  Mental Status: Normal mood and affect. Normal behavior. Normal judgment and thought content.   Urinalysis:      Assessment and Plan:  Pregnancy: [id number] at [redacted]w[redacted]d  1. Poor fetal growth affecting management of mother in third trimester, single or unspecified fetus   2.  Supervision of high risk pregnancy, antepartum, third trimester  3. Recurrent urinary tract infection affecting pregnancy in third trimester Continue with Keflex  4. Malpresentation before onset of labor, single or unspecified fetus Vertex  5. Unwanted fertility BTL  6. Vaginal discharge during pregnancy in third trimester  - metroNIDAZOLE (FLAGYL) 250 MG tablet; Take 1 tablet (250 mg total) by mouth 3 (three) times daily.  Dispense: 21 tablet; Refill: 0  Term labor symptoms and general obstetric precautions including but not limited to vaginal bleeding, contractions, leaking of fluid and fetal movement were reviewed in detail with the patient. Please refer to After Visit Summary for other counseling recommendations.  Return in about 6 weeks (around 06/16/2016).   Hermina Staggers, MD

## 2016-05-06 ENCOUNTER — Encounter: Payer: Self-pay | Admitting: Student

## 2016-05-06 ENCOUNTER — Telehealth: Payer: Self-pay | Admitting: Family Medicine

## 2016-05-06 ENCOUNTER — Ambulatory Visit (INDEPENDENT_AMBULATORY_CARE_PROVIDER_SITE_OTHER): Payer: Medicaid Other | Admitting: Student

## 2016-05-06 VITALS — BP 106/78 | HR 64 | Temp 99.3°F | Wt 149.0 lb

## 2016-05-06 DIAGNOSIS — B852 Pediculosis, unspecified: Secondary | ICD-10-CM

## 2016-05-06 MED ORDER — PERMETHRIN 1 % EX LOTN: 1.0000 "application " | TOPICAL_LOTION | Freq: Once | CUTANEOUS | 1 refills | Status: AC

## 2016-05-06 NOTE — Assessment & Plan Note (Addendum)
Permethrin lotion for hair given with instructions reviewed. Discussed washing all clothes and bed sheets in hot water  - follow with PCP as needed

## 2016-05-06 NOTE — Patient Instructions (Signed)
Follow up as needed Apply Permethrin lotion to hair Prior to application, wash hair with conditioner-free shampoo; rinse with water and towel dry. Apply a sufficient amount of lotion or cream rinse to saturate the hair and scalp (especially behind the ears and nape of neck). Leave on hair for 10 minutes (but no longer), then rinse off with warm water; remove remaining nits with nit comb. A single application is generally sufficient; however, may repeat 7 days after first treatment if lice or nits are still present.

## 2016-05-06 NOTE — Telephone Encounter (Signed)
Pt states insurance did not cover the lice treatment and would like something called in that her insurance will cover. Pt uses [facility] on [address]. ep

## 2016-05-06 NOTE — Progress Notes (Signed)
Subjective:    Patient ID: Rachel Lloyd, female    DOB: September 16, 1985, 31 y.o.   MRN: [id number]   CC: concern for lice  HPI: 31 y/o F presents for concern for lice  Concern for lice - patient presents after her hair dresser feels she saw lice in her hair this AM - she denied increased head itching   Smoking status reviewed  Review of Systems  Per HPI, else denies recent illness, fever,  chest pain, shortness of breath,    Objective:  BP 106/78   Pulse 64   Temp 99.3 F (37.4 C) (Oral)   Wt 149 lb (67.6 kg)   LMP 08/09/2015 (Exact Date)   BMI 27.25 kg/m  Vitals and nursing note reviewed  General: NAD HEENT: copious lice and nits throughout hair Cardiac: RRR,  Respiratory: CTAB, normal effort Skin: warm and dry, no rashes noted Neuro: alert and oriented, no focal deficits   Assessment & Plan:    Lice Permethrin lotion for hair given with instructions reviewed. Discussed washing all clothes and bed sheets in hot water  - follow with PCP as needed    Decker Cogdell A. Kennon Rounds MD, MS Family Medicine Resident PGY-3 Pager [phone]

## 2016-05-06 NOTE — Telephone Encounter (Signed)
Will forward to provider that saw patient in clinic today.  Clovis Pu, RN

## 2016-05-06 NOTE — Telephone Encounter (Signed)
She will need to call her insurance to see what they will cover.

## 2016-05-07 NOTE — Telephone Encounter (Signed)
Please call patient to inquire about ability to obtain permethrin. If it remains an issue there are overt he counter options she can try. Patient was called about this but did not answer.

## 2016-05-07 NOTE — Telephone Encounter (Signed)
Called the patient to discuss options for obtaining the medication at a cheaper rate. GoodRx was discussed with the cheapest rate for permethrin less than half of the pice that she was quoted yesterday. This price was given to her and she felt she could afford this. She also states she is able to print the goodRx coupon to obtain the cheaper rate. Combing her hair with a nit come was also discussed. She will follow as needed

## 2016-05-07 NOTE — Telephone Encounter (Signed)
Tried calling patient again but her voicemail is not set up.  Faatimah Spielberg,CMA

## 2016-05-08 ENCOUNTER — Inpatient Hospital Stay (HOSPITAL_COMMUNITY): Payer: Medicaid Other | Admitting: Anesthesiology

## 2016-05-08 ENCOUNTER — Inpatient Hospital Stay (HOSPITAL_COMMUNITY)
Admission: RE | Admit: 2016-05-08 | Discharge: 2016-05-10 | DRG: 775 | Disposition: A | Discharge status: Home / Self Care | Payer: Medicaid Other | Source: Ambulatory Visit | Attending: Obstetrics & Gynecology | Admitting: Obstetrics & Gynecology

## 2016-05-08 ENCOUNTER — Encounter (HOSPITAL_COMMUNITY): Payer: Self-pay

## 2016-05-08 VITALS — BP 108/67 | HR 88 | Temp 98.2°F | Resp 26 | Ht 62.0 in | Wt 149.0 lb

## 2016-05-08 DIAGNOSIS — O99824 Streptococcus B carrier state complicating childbirth: Secondary | ICD-10-CM | POA: Diagnosis present

## 2016-05-08 DIAGNOSIS — O0993 Supervision of high risk pregnancy, unspecified, third trimester: Secondary | ICD-10-CM

## 2016-05-08 DIAGNOSIS — F1210 Cannabis abuse, uncomplicated: Secondary | ICD-10-CM | POA: Diagnosis present

## 2016-05-08 DIAGNOSIS — K219 Gastro-esophageal reflux disease without esophagitis: Secondary | ICD-10-CM | POA: Diagnosis present

## 2016-05-08 DIAGNOSIS — O99334 Smoking (tobacco) complicating childbirth: Secondary | ICD-10-CM | POA: Diagnosis present

## 2016-05-08 DIAGNOSIS — B850 Pediculosis due to Pediculus humanus capitis: Secondary | ICD-10-CM | POA: Diagnosis present

## 2016-05-08 DIAGNOSIS — Z880 Allergy status to penicillin: Secondary | ICD-10-CM

## 2016-05-08 DIAGNOSIS — Z3A39 39 weeks gestation of pregnancy: Secondary | ICD-10-CM

## 2016-05-08 DIAGNOSIS — O99324 Drug use complicating childbirth: Secondary | ICD-10-CM | POA: Diagnosis present

## 2016-05-08 DIAGNOSIS — F17210 Nicotine dependence, cigarettes, uncomplicated: Secondary | ICD-10-CM | POA: Diagnosis present

## 2016-05-08 DIAGNOSIS — O365930 Maternal care for other known or suspected poor fetal growth, third trimester, not applicable or unspecified: Principal | ICD-10-CM | POA: Diagnosis present

## 2016-05-08 DIAGNOSIS — O365990 Maternal care for other known or suspected poor fetal growth, unspecified trimester, not applicable or unspecified: Secondary | ICD-10-CM | POA: Diagnosis present

## 2016-05-08 DIAGNOSIS — O9962 Diseases of the digestive system complicating childbirth: Secondary | ICD-10-CM | POA: Diagnosis present

## 2016-05-08 DIAGNOSIS — Z8249 Family history of ischemic heart disease and other diseases of the circulatory system: Secondary | ICD-10-CM | POA: Diagnosis not present

## 2016-05-08 DIAGNOSIS — O99820 Streptococcus B carrier state complicating pregnancy: Secondary | ICD-10-CM

## 2016-05-08 LAB — TYPE AND SCREEN
ABO/RH(D): A POS
Antibody Screen: NEGATIVE

## 2016-05-08 LAB — CBC
HCT: 29.4 % — ABNORMAL LOW (ref 36.0–46.0)
Hemoglobin: 10.5 g/dL — ABNORMAL LOW (ref 12.0–15.0)
MCH: 30.8 pg (ref 26.0–34.0)
MCHC: 35.7 g/dL (ref 30.0–36.0)
MCV: 86.2 fL (ref 78.0–100.0)
Platelets: 196 10*3/uL (ref 150–400)
RBC: 3.41 MIL/uL — ABNORMAL LOW (ref 3.87–5.11)
RDW: 13.6 % (ref 11.5–15.5)
WBC: 6.6 10*3/uL (ref 4.0–10.5)

## 2016-05-08 LAB — RPR: RPR Ser Ql: NONREACTIVE

## 2016-05-08 LAB — RAPID URINE DRUG SCREEN, HOSP PERFORMED
Amphetamines: NOT DETECTED
Barbiturates: NOT DETECTED
Benzodiazepines: NOT DETECTED
Cocaine: NOT DETECTED
Opiates: NOT DETECTED
Tetrahydrocannabinol: POSITIVE — AB

## 2016-05-08 MED ORDER — ACETAMINOPHEN 325 MG PO TABS: 650.0000 mg | ORAL_TABLET | ORAL | Status: DC | PRN

## 2016-05-08 MED ORDER — DIBUCAINE 1 % RE OINT: 1.0000 "application " | TOPICAL_OINTMENT | RECTAL | Status: DC | PRN

## 2016-05-08 MED ORDER — EPHEDRINE 5 MG/ML INJ
10.0000 mg | INTRAVENOUS | Status: DC | PRN
  Filled 2016-05-08: qty 2

## 2016-05-08 MED ORDER — SIMETHICONE 80 MG PO CHEW: 80.0000 mg | CHEWABLE_TABLET | ORAL | Status: DC | PRN

## 2016-05-08 MED ORDER — DIPHENHYDRAMINE HCL 25 MG PO CAPS: 25.0000 mg | ORAL_CAPSULE | Freq: Four times a day (QID) | ORAL | Status: DC | PRN

## 2016-05-08 MED ORDER — OXYTOCIN 40 UNITS IN LACTATED RINGERS INFUSION - SIMPLE MED: 2.5000 [IU]/h | INTRAVENOUS | Status: DC

## 2016-05-08 MED ORDER — SENNOSIDES-DOCUSATE SODIUM 8.6-50 MG PO TABS
2.0000 | ORAL_TABLET | ORAL | Status: DC
  Administered 2016-05-08 – 2016-05-09 (×2): 2 via ORAL
  Filled 2016-05-08 (×2): qty 2

## 2016-05-08 MED ORDER — VANCOMYCIN HCL IN DEXTROSE 1-5 GM/200ML-% IV SOLN
1000.0000 mg | Freq: Two times a day (BID) | INTRAVENOUS | Status: DC
  Administered 2016-05-08: 1000 mg via INTRAVENOUS
  Filled 2016-05-08 (×2): qty 200

## 2016-05-08 MED ORDER — PRENATAL MULTIVITAMIN CH
1.0000 | ORAL_TABLET | Freq: Every day | ORAL | Status: DC
  Administered 2016-05-09 – 2016-05-10 (×2): 1 via ORAL
  Filled 2016-05-08 (×2): qty 1

## 2016-05-08 MED ORDER — CLINDAMYCIN PHOSPHATE 900 MG/50ML IV SOLN: 900.0000 mg | Freq: Three times a day (TID) | INTRAVENOUS | Status: DC

## 2016-05-08 MED ORDER — SODIUM CHLORIDE 0.9% FLUSH: 3.0000 mL | Freq: Two times a day (BID) | INTRAVENOUS | Status: DC

## 2016-05-08 MED ORDER — LACTATED RINGERS IV SOLN
INTRAVENOUS | Status: DC
  Administered 2016-05-08: 09:00:00 via INTRAVENOUS

## 2016-05-08 MED ORDER — COCONUT OIL OIL: 1.0000 "application " | TOPICAL_OIL | Status: DC | PRN

## 2016-05-08 MED ORDER — SOD CITRATE-CITRIC ACID 500-334 MG/5ML PO SOLN: 30.0000 mL | ORAL | Status: DC | PRN

## 2016-05-08 MED ORDER — OXYCODONE HCL 5 MG PO TABS
5.0000 mg | ORAL_TABLET | Freq: Four times a day (QID) | ORAL | Status: DC | PRN
  Administered 2016-05-10: 5 mg via ORAL
  Filled 2016-05-08: qty 1

## 2016-05-08 MED ORDER — TETANUS-DIPHTH-ACELL PERTUSSIS 5-2.5-18.5 LF-MCG/0.5 IM SUSP: 0.5000 mL | Freq: Once | INTRAMUSCULAR | Status: DC

## 2016-05-08 MED ORDER — SODIUM CHLORIDE 0.9% FLUSH: 3.0000 mL | INTRAVENOUS | Status: DC | PRN

## 2016-05-08 MED ORDER — FENTANYL 2.5 MCG/ML BUPIVACAINE 1/10 % EPIDURAL INFUSION (WH - ANES)
14.0000 mL/h | INTRAMUSCULAR | Status: DC | PRN
  Administered 2016-05-08: 14 mL/h via EPIDURAL
  Filled 2016-05-08: qty 100

## 2016-05-08 MED ORDER — ONDANSETRON HCL 4 MG/2ML IJ SOLN: 4.0000 mg | INTRAMUSCULAR | Status: DC | PRN

## 2016-05-08 MED ORDER — FENTANYL CITRATE (PF) 100 MCG/2ML IJ SOLN
100.0000 ug | INTRAMUSCULAR | Status: DC | PRN
  Administered 2016-05-08: 100 ug via INTRAVENOUS

## 2016-05-08 MED ORDER — WITCH HAZEL-GLYCERIN EX PADS: 1.0000 "application " | MEDICATED_PAD | CUTANEOUS | Status: DC | PRN

## 2016-05-08 MED ORDER — PERMETHRIN 1 % EX LOTN
TOPICAL_LOTION | Freq: Once | CUTANEOUS | Status: AC
  Administered 2016-05-08: 1 via TOPICAL
  Filled 2016-05-08: qty 59

## 2016-05-08 MED ORDER — FENTANYL CITRATE (PF) 100 MCG/2ML IJ SOLN
100.0000 ug | INTRAMUSCULAR | Status: DC | PRN
  Filled 2016-05-08: qty 2

## 2016-05-08 MED ORDER — OXYTOCIN 40 UNITS IN LACTATED RINGERS INFUSION - SIMPLE MED: 2.5000 [IU]/h | INTRAVENOUS | Status: DC | PRN

## 2016-05-08 MED ORDER — LACTATED RINGERS IV SOLN: 500.0000 mL | INTRAVENOUS | Status: DC | PRN

## 2016-05-08 MED ORDER — OXYCODONE-ACETAMINOPHEN 5-325 MG PO TABS: 1.0000 | ORAL_TABLET | ORAL | Status: DC | PRN

## 2016-05-08 MED ORDER — OXYTOCIN BOLUS FROM INFUSION
500.0000 mL | Freq: Once | INTRAVENOUS | Status: AC
Start: 2016-05-08 — End: 2016-05-08
  Administered 2016-05-08: 500 mL via INTRAVENOUS

## 2016-05-08 MED ORDER — PHENYLEPHRINE 40 MCG/ML (10ML) SYRINGE FOR IV PUSH (FOR BLOOD PRESSURE SUPPORT)
80.0000 ug | PREFILLED_SYRINGE | INTRAVENOUS | Status: DC | PRN
  Filled 2016-05-08: qty 5
  Filled 2016-05-08: qty 10

## 2016-05-08 MED ORDER — DIPHENHYDRAMINE HCL 50 MG/ML IJ SOLN: 12.5000 mg | INTRAMUSCULAR | Status: DC | PRN

## 2016-05-08 MED ORDER — LIDOCAINE HCL (PF) 1 % IJ SOLN
INTRAMUSCULAR | Status: DC | PRN
  Administered 2016-05-08: 5 mL via EPIDURAL
  Administered 2016-05-08: 3 mL via EPIDURAL
  Administered 2016-05-08: 2 mL via EPIDURAL

## 2016-05-08 MED ORDER — IBUPROFEN 600 MG PO TABS
600.0000 mg | ORAL_TABLET | Freq: Four times a day (QID) | ORAL | Status: DC
  Administered 2016-05-08 – 2016-05-10 (×7): 600 mg via ORAL
  Filled 2016-05-08 (×8): qty 1

## 2016-05-08 MED ORDER — FLEET ENEMA 7-19 GM/118ML RE ENEM: 1.0000 | ENEMA | RECTAL | Status: DC | PRN

## 2016-05-08 MED ORDER — LIDOCAINE HCL (PF) 1 % IJ SOLN
30.0000 mL | INTRAMUSCULAR | Status: DC | PRN
  Filled 2016-05-08: qty 30

## 2016-05-08 MED ORDER — TERBUTALINE SULFATE 1 MG/ML IJ SOLN
0.2500 mg | Freq: Once | INTRAMUSCULAR | Status: DC | PRN
  Filled 2016-05-08: qty 1

## 2016-05-08 MED ORDER — ZOLPIDEM TARTRATE 5 MG PO TABS: 5.0000 mg | ORAL_TABLET | Freq: Every evening | ORAL | Status: DC | PRN

## 2016-05-08 MED ORDER — ONDANSETRON HCL 4 MG/2ML IJ SOLN: 4.0000 mg | Freq: Four times a day (QID) | INTRAMUSCULAR | Status: DC | PRN

## 2016-05-08 MED ORDER — SODIUM CHLORIDE 0.9 % IV SOLN: 250.0000 mL | INTRAVENOUS | Status: DC | PRN

## 2016-05-08 MED ORDER — ACETAMINOPHEN 325 MG PO TABS
650.0000 mg | ORAL_TABLET | ORAL | Status: DC | PRN
  Administered 2016-05-08: 650 mg via ORAL
  Filled 2016-05-08: qty 2

## 2016-05-08 MED ORDER — ONDANSETRON HCL 4 MG PO TABS: 4.0000 mg | ORAL_TABLET | ORAL | Status: DC | PRN

## 2016-05-08 MED ORDER — PHENYLEPHRINE 40 MCG/ML (10ML) SYRINGE FOR IV PUSH (FOR BLOOD PRESSURE SUPPORT)
80.0000 ug | PREFILLED_SYRINGE | INTRAVENOUS | Status: DC | PRN
  Filled 2016-05-08: qty 5

## 2016-05-08 MED ORDER — BENZOCAINE-MENTHOL 20-0.5 % EX AERO
1.0000 "application " | INHALATION_SPRAY | CUTANEOUS | Status: DC | PRN
  Administered 2016-05-10: 1 via TOPICAL
  Filled 2016-05-08: qty 56

## 2016-05-08 MED ORDER — LACTATED RINGERS IV SOLN: 500.0000 mL | Freq: Once | INTRAVENOUS | Status: DC

## 2016-05-08 MED ORDER — OXYCODONE-ACETAMINOPHEN 5-325 MG PO TABS: 2.0000 | ORAL_TABLET | ORAL | Status: DC | PRN

## 2016-05-08 MED ORDER — OXYTOCIN 40 UNITS IN LACTATED RINGERS INFUSION - SIMPLE MED
1.0000 m[IU]/min | INTRAVENOUS | Status: DC
  Administered 2016-05-08: 2 m[IU]/min via INTRAVENOUS
  Filled 2016-05-08: qty 1000

## 2016-05-08 MED ORDER — LACTATED RINGERS IV SOLN
500.0000 mL | Freq: Once | INTRAVENOUS | Status: AC
  Administered 2016-05-08: 500 mL via INTRAVENOUS

## 2016-05-08 NOTE — Progress Notes (Signed)
Labor Progress Note  S: discussed AROM with patient, she was agreeable  O:  BP 112/64   Pulse 77   Temp 97.4 F (36.3 C) (Oral)   Resp 19   Ht  (1.575 m)   Wt 67.6 kg (149 lb)   LMP 08/09/2015 (Exact Date)   SpO2 98%   BMI 27.25 kg/m  EFM: 130, moderate, accels, no decels [id number]: 8 Effacement (%): 0 Cervical Position: Middle Station: -1 Presentation: Vertex Exam by:: Dr. Hillis Range   A&P: 31 y.o. [id number] [redacted]w[redacted]d IOL for IUGR #Labor: AROM, pitocin #FWB: Category 1 tracing #GBS: GBS positive, vanco  [name], MD Resident Physician 5:53 PM

## 2016-05-08 NOTE — Progress Notes (Signed)
Patient ID: Rachel Lloyd, female   DOB: 1985-09-27, 31 y.o.   MRN: [id number]  S: Patient seen & examined for progress of labor. Patient comfortable, beginning to feel regular contractions but are not very strong yet.    O:  Vitals:   05/08/16 1250 05/08/16 1300 05/08/16 1334 05/08/16 1402  BP:  108/79 116/77 106/73  Pulse:  87 87 97  Resp:  Temp: 98.4 F (36.9 C)     TempSrc: Oral     Weight:      Height:        Dilation: 1.5 Effacement (%): 80 Cervical Position: Posterior Station: -3 Presentation: Vertex Exam by:: Wilder Amodei  Foley bulb placed with ease, insufflated to 60cc with saline. Tolerated procedure well.  FHT: 130bpm, mod var, +accels, no decels TOCO: q2-12min   A/P: Foley bulb placed Continue pitocin Continue expectant management Anticipate SVD

## 2016-05-08 NOTE — Anesthesia Preprocedure Evaluation (Signed)
Anesthesia Evaluation  Patient identified by MRN, date of birth, ID band Patient awake    Reviewed: Allergy & Precautions, NPO status , Patient's Chart, lab work & pertinent test results  Airway Mallampati: II  TM Distance: >3 FB Neck ROM: Full    Dental  (+) Teeth Intact, Dental Advisory Given   Pulmonary Current Smoker,    Pulmonary exam normal breath sounds clear to auscultation       Cardiovascular hypertension, Normal cardiovascular exam Rhythm:Regular Rate:Normal     Neuro/Psych PSYCHIATRIC DISORDERS Depression Bipolar Disorder negative neurological ROS     GI/Hepatic Neg liver ROS, GERD  Medicated,  Endo/Other  negative endocrine ROS  Renal/GU negative Renal ROS     Musculoskeletal negative musculoskeletal ROS (+)   Abdominal   Peds  Hematology  (+) Blood dyscrasia, anemia , Plt 196k   Anesthesia Other Findings Day of surgery medications reviewed with the patient.  Reproductive/Obstetrics (+) Pregnancy                             Anesthesia Physical Anesthesia Plan  ASA: II  Anesthesia Plan: Epidural   Post-op Pain Management:    Induction:   Airway Management Planned:   Additional Equipment:   Intra-op Plan:   Post-operative Plan:   Informed Consent: I have reviewed the patients History and Physical, chart, labs and discussed the procedure including the risks, benefits and alternatives for the proposed anesthesia with the patient or authorized representative who has indicated his/her understanding and acceptance.   Dental advisory given  Plan Discussed with:   Anesthesia Plan Comments: (Patient identified. Risks/Benefits/Options discussed with patient including but not limited to bleeding, infection, nerve damage, paralysis, failed block, incomplete pain control, headache, blood pressure changes, nausea, vomiting, reactions to medication both or allergic, itching and  postpartum back pain. Confirmed with bedside nurse the patient's most recent platelet count. Confirmed with patient that they are not currently taking any anticoagulation, have any bleeding history or any family history of bleeding disorders. Patient expressed understanding and wished to proceed. All questions were answered. )        Anesthesia Quick Evaluation

## 2016-05-08 NOTE — H&P (Signed)
OBSTETRIC ADMISSION HISTORY AND PHYSICAL  Rachel Lloyd is a 31 y.o. female [phone] with IUP at [redacted]w[redacted]d by LMP c/w 6 week [place] presenting for IOL for FGR. She reports +FMs, No LOF, no VB, no blurry vision, headaches or peripheral edema, and RUQ pain.  She plans on breast feeding. She request BTL for birth control.  Dating: By LMP c/w 6 week [place] --->  Estimated Date of Delivery: 05/15/16  Prenatal History/Complications:  Past Medical History: Past Medical History:  Diagnosis Date  . Chlamydia   . Hypertension    During second pregnancy  . Mental disorder 2010   bipolar  . Pneumonia   . Trichimoniasis     Past Surgical History: Past Surgical History:  Procedure Laterality Date  . INDUCED ABORTION    . MOUTH SURGERY     as a child  . WISDOM TOOTH EXTRACTION      Obstetrical History: OB History    Gravida Para Term Preterm AB Living   0 4 3   SAB TAB Ectopic Multiple Live Births   1 3 0 0 3      Social History: Social History   Social History  . Marital status: Single    Spouse name: N/A  . Number of children: N/A  . Years of education: N/A   Social History Main Topics  . Smoking status: Current Some Day Smoker    Types: Cigarettes    Last attempt to quit: 04/04/2010  . Smokeless tobacco: Never Used  . Alcohol use No     Comment: quit when found out was pregnant  . Drug use: Yes    Types: Marijuana, Cocaine     Comment: last used cocaine 7 months ago, +THC  . Sexual activity: Not Currently    Birth control/ protection: Other-see comments     Comment: getting tubal after discharge   Other Topics Concern  . None   Social History Narrative  . None    Family History: Family History  Problem Relation Age of Onset  . Hypertension Mother   . Cancer Mother   . Fibroids Mother   . ADD / ADHD Brother   . Hypertension Maternal Grandmother   . Anesthesia problems Neg Hx     Allergies: Allergies  Allergen Reactions  . Penicillins Anaphylaxis    Has  taken cephalosprorins Has patient had a PCN reaction causing immediate rash, facial/tongue/throat swelling, SOB or lightheadedness with hypotension: Yes Has patient had a PCN reaction causing severe rash involving mucus membranes or skin necrosis: Yes Has patient had a PCN reaction that required hospitalization Yes Has patient had a PCN reaction occurring within the last 10 years: No If all of the above answers are "NO", then may proceed with Cephalosporin use.  . Latex Itching and Rash    Prescriptions Prior to Admission  Medication Sig Dispense Refill Last Dose  . acetaminophen (TYLENOL) 325 MG tablet Take 650 mg by mouth every 6 (six) hours as needed for mild pain or headache.   Past Week at Unknown time  . cephALEXin (KEFLEX) 500 MG capsule Take 1 capsule (500 mg total) by mouth 4 (four) times daily. Keflex QID x 7 days, then once daily for UTI suppression. 28 capsule 3 05/08/2016 at Unknown time  . metroNIDAZOLE (FLAGYL) 250 MG tablet Take 1 tablet (250 mg total) by mouth 3 (three) times daily. 21 tablet 0 05/08/2016 at Unknown time  . cetirizine (ZYRTEC) 10 MG tablet Take 1 tablet (10  mg total) by mouth daily. (Patient not taking: Reported on 01/18/2016) 14 tablet 1 Unknown at Unknown time  . docusate sodium (COLACE) 100 MG capsule Take 1 capsule (100 mg total) by mouth 2 (two) times daily. (Patient not taking: Reported on 01/18/2016) 30 capsule 0 Unknown at Unknown time  . famotidine (PEPCID) 20 MG tablet Take 1 tablet (20 mg total) by mouth daily. (Patient not taking: Reported on 01/18/2016) 30 tablet 1 Unknown at Unknown time  . metroNIDAZOLE (FLAGYL) 500 MG tablet Take 1 tablet (500 mg total) by mouth 2 (two) times daily. (Patient not taking: Reported on 04/24/2016) 14 tablet 0 Not Taking  . polyethylene glycol powder (GLYCOLAX/MIRALAX) powder Take 17 g by mouth 2 (two) times daily as needed. (Patient not taking: Reported on 05/05/2016) 3350 g 1 Unknown at Unknown time  . Prenatal Vit-Fe  Fumarate-FA (MULTIVITAMIN-PRENATAL) 27-0.8 MG TABS tablet Take 1 tablet by mouth daily at 12 noon. (Patient not taking: Reported on 05/01/2016) 30 each 11 Unknown at Unknown time     Review of Systems   All systems reviewed and negative except as stated in HPI  Blood pressure 103/69, pulse 93, temperature 98.4 F (36.9 C), temperature source Oral, resp. rate 17, height  (1.575 m), weight 67.6 kg (149 lb), last menstrual period 08/09/2015. General appearance: alert, cooperative and no distress. Nits present in hair Lungs: clear to auscultation bilaterally Heart: regular rate and rhythm Abdomen: soft, non-tender; bowel sounds normal Extremities: Homans sign is negative, no sign of DVT Presentation: cephalic but unstable lie Fetal monitoringBaseline: 130 bpm, Variability: Good {> 6 bpm), Accelerations: Reactive and Decelerations: Absent Uterine activityNone Dilation: Fingertip Effacement (%): 70 Station: Ballotable Exam by:: Leahmarie Gasiorowski, MD   Prenatal labs: ABO, Rh: --/--/A POS (04/26 1610) Antibody: NEG (04/26 0834) Rubella: immune RPR: NON REAC (11/09 1123)  HBsAg: NEGATIVE (11/09 1123)  HIV: NONREACTIVE (11/09 1123)  GBS:   Positive 1 hr Glucola: not available for review Genetic screening:  Declined, had genetic counselling Anatomy US: growth restriction, female, nl anatomy  Prenatal Transfer Tool  Maternal Diabetes: No Genetic Screening: Declined Maternal Ultrasounds/Referrals: Abnormal:  Findings:   IUGR Fetal Ultrasounds or other Referrals:  Referred to Materal Fetal Medicine  Maternal Substance Abuse:  Yes:  Type: Marijuana Significant Maternal Medications:  None Significant Maternal Lab Results: Lab values include: Group B Strep positive  Results for orders placed or performed during the hospital encounter of 05/08/16 (from the past 24 hour(s))  CBC   Collection Time: 05/08/16  8:34 AM  Result Value Ref Range   WBC 6.6 4.0 - 10.5 K/uL   RBC 3.41 (L) 3.87 - 5.11  MIL/uL   Hemoglobin 10.5 (L) 12.0 - 15.0 g/dL   HCT 96.0 (L) 45.4 - 09.8 %   MCV 86.2 78.0 - 100.0 fL   MCH 30.8 26.0 - 34.0 pg   MCHC 35.7 30.0 - 36.0 g/dL   RDW 11.9 14.7 - 82.9 %   Platelets 196 150 - 400 K/uL  Type and screen [facility] [place]   Collection Time: 05/08/16  8:34 AM  Result Value Ref Range   ABO/RH(D) A POS    Antibody Screen NEG    Sample Expiration 05/11/2016     Patient Active Problem List   Diagnosis Date Noted  . Lice 05/06/2016  . Malpresentation before onset of labor 05/05/2016  . Unwanted fertility 05/05/2016  . Recurrent UTI (urinary tract infection) complicating pregnancy 03/26/2016  . Substance abuse affecting pregnancy, antepartum 03/23/2016  .  Supervision of high risk pregnancy, antepartum, third trimester 02/18/2016  . Urinary tract colonization by group B Streptococcus affecting pregnancy 01/16/2016  . IUGR (intrauterine growth restriction) affecting care of mother 01/11/2016  . Low TSH level 05/16/2014  . 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 Hx of induced abortion 08/05/2013  . Abscess of axilla, right 10/28/2012  . Hypertension 05/21/2011  . Bipolar disorder (HCC) 03/18/2007    Assessment: ASIYAH PINEAU is a 31 y.o. [phone] at [redacted]w[redacted]d here for IOL   #Labor: pitocin, abdominal binder due to unstable lie #Pain: Fentanyl, epidural upon maternal request and >3cm #FWB: Category 1 tracing #ID:  GBS positive, tx with vanc due to PCN allergy #MOF: breast #MOC:BTL #Circ:  n/a, female  Head lice -confirmed with microscopy -permetherin given and patient showered upon admission -contact precautions  [name], MD 05/08/2016, 11:27 AM   OB FELLOW HISTORY AND PHYSICAL ATTESTATION  I have seen and examined this patient; I agree with above documentation in the resident's note. An abdominal binder was placed on the patient 2/2 unstable lie (BSUS performed, found to be  cephalic but slightly oblique to maternal right, after binder placed, was cephalic midline. Pitocin was started. Her cervix was reexamined and found to be 2/70/-3.   Jen Mow, DO [place] Fellow 05/08/2016

## 2016-05-08 NOTE — Anesthesia Procedure Notes (Signed)
Epidural Patient location during procedure: OB Start time: 05/08/2016 4:39 PM End time: 05/08/2016 4:44 PM  Staffing Anesthesiologist: Cecile Hearing Performed: anesthesiologist   Preanesthetic Checklist Completed: patient identified, pre-op evaluation, timeout performed, IV checked, risks and benefits discussed and monitors and equipment checked  Epidural Patient position: sitting Prep: DuraPrep Patient monitoring: blood pressure and continuous pulse ox Approach: midline Location: L3-L4 Injection technique: LOR air  Needle:  Needle type: Tuohy  Needle gauge: 17 G Needle length: 9 cm Needle insertion depth: 5 cm Catheter size: 19 Gauge Catheter at skin depth: 10 cm Test dose: negative and Other (1% Lidocaine)  Additional Notes Patient identified.  Risk benefits discussed including failed block, incomplete pain control, headache, nerve damage, paralysis, blood pressure changes, nausea, vomiting, reactions to medication both toxic or allergic, and postpartum back pain.  Patient expressed understanding and wished to proceed.  All questions were answered.  Sterile technique used throughout procedure and epidural site dressed with sterile barrier dressing. No paresthesia or other complications noted. The patient did not experience any signs of intravascular injection such as tinnitus or metallic taste in mouth nor signs of intrathecal spread such as rapid motor block. Please see nursing notes for vital signs. Reason for block:procedure for pain

## 2016-05-09 NOTE — Progress Notes (Signed)
Post Partum Day 1 Subjective:  Rachel Lloyd is a 31 y.o. [id number] [redacted]w[redacted]d s/p NSVD after IOL for IUGR.  No acute events overnight.  Pt denies problems with ambulating, voiding or po intake.  She denies nausea or vomiting.  Pain is well controlled.  She has had flatus. She has not had bowel movement.  Lochia Small.  Plan for birth control is bilateral tubal ligation outpatient  Method of Feeding: breast/bottle  Objective: Blood pressure 107/78, pulse (!) 52, temperature 98.1 F (36.7 C), temperature source Axillary, resp. rate 16, height  (1.575 m), weight 67.6 kg (149 lb), last menstrual period 08/09/2015, SpO2 98 %, unknown if currently breastfeeding.  Physical Exam:  General: alert, cooperative and no distress Lochia:normal flow Chest: CTAB Heart: RRR no m/r/g Abdomen: +BS, soft, nontender,  Uterine Fundus: firm, at level of umbilicus DVT Evaluation: No evidence of DVT seen on physical exam. Extremities: no edema   Recent Labs  05/08/16 0834  HGB 10.5*  HCT 29.4*    Assessment/Plan:  ASSESSMENT: Rachel Lloyd is a 31 y.o. [id number] [redacted]w[redacted]d s/p NSVD after IOL for IUGR  Plan for discharge tomorrow, Breastfeeding and Social Work consult for assistance with car seat and support   LOS: 1 day   [name] 05/09/2016, 9:28 AM   I confirm that I have verified the information documented in the resident's note and that I have also personally reperformed the physical exam and all medical decision making activities.  Cam Hai 05/09/2016 4:08 PM

## 2016-05-09 NOTE — Progress Notes (Signed)
No nits or eggs seen I hair on examination. Several dead louse on end of hair.

## 2016-05-09 NOTE — Anesthesia Postprocedure Evaluation (Signed)
Anesthesia Post Note  Patient: Rachel Lloyd  Procedure(s) Performed: * No procedures listed *  Patient location during evaluation: Mother Baby Anesthesia Type: Epidural Level of consciousness: awake Pain management: satisfactory to patient Vital Signs Assessment: post-procedure vital signs reviewed and stable Respiratory status: spontaneous breathing Cardiovascular status: stable Anesthetic complications: no        Last Vitals:  Vitals:   05/08/16 2200 05/09/16 0200  BP: (!) 99/59 107/78  Pulse: 87 (!) 52  Resp: 18 16  Temp: 36.8 C 36.7 C    Last Pain:  Vitals:   05/09/16 0730  TempSrc:   PainSc: 0-No pain   Pain Goal: Patients Stated Pain Goal: 6 (05/08/16 1251)               Cephus Shelling

## 2016-05-09 NOTE — Progress Notes (Signed)
UR chart review completed.  

## 2016-05-10 MED ORDER — IBUPROFEN 600 MG PO TABS: 600.0000 mg | ORAL_TABLET | Freq: Four times a day (QID) | ORAL | 0 refills | Status: DC

## 2016-05-10 NOTE — Discharge Summary (Signed)
Obstetric Discharge Summary Reason for Admission: IUGR Prenatal Procedures: ultrasound Intrapartum Procedures: spontaneous vaginal delivery Postpartum Procedures: none Complications-Operative and Postpartum: none Hemoglobin  Date Value Ref Range Status  05/08/2016 10.5 (L) 12.0 - 15.0 g/dL Final   HCT  Date Value Ref Range Status  05/08/2016 29.4 (L) 36.0 - 46.0 % Final    Physical Exam:  General: alert Lochia: appropriate Uterine Fundus: firm Incision: healing well DVT Evaluation: No evidence of DVT seen on physical exam.  Discharge Diagnoses: Term Pregnancy-delivered  Discharge Information: Date: 05/10/2016 Activity: pelvic rest Diet: routine Medications: Ibuprofen Condition: stable Instructions: refer to practice specific booklet Discharge to: home Follow-up Information    [facility]. Schedule an appointment as soon as possible for a visit in 6 week(s).   Contact information: [address] [place] [address] [phone]          Newborn Data: Live born female  Birth Weight: 4 lb 12.7 oz (2175 g) APGAR: 8, 9  Stable in NBN  Hermina Staggers 05/10/2016, 10:50 AM

## 2016-05-10 NOTE — Plan of Care (Signed)
Problem: Education: Goal: Knowledge of condition will improve Discussed discharge education with patient. Baby staying in hospital as a baby patient. Mother verbalizes understanding of information.

## 2016-05-10 NOTE — Discharge Instructions (Signed)

## 2016-05-10 NOTE — Clinical Social Work Maternal (Signed)
CLINICAL SOCIAL WORK MATERNAL/CHILD NOTE  Patient Details  Name: Rachel Lloyd MRN: [id number] Date of Birth: 07/25/1985  Date:  05/10/2016  Clinical Social Worker Initiating Note:  Denym Christenberry, MSW, LCSW-A  Date/ Time Initiated:  05/10/16/1155     Child's Name:  Rachel Lloyd    Legal Guardian:  Mother   Need for Interpreter:  None   Date of Referral:  10/03/2016     Reason for Referral:  Current Substance Use/Substance Use During Pregnancy    Referral Source:  Central Nursery   Address:  [address] [place] [address]  Phone number:  [phone]   Household Members:  Self, Minor Children (Javier Richardson DOB 04/12/2011, Kmisa Richardson 08/01/2012)   Natural Supports (not living in the home):  Parent   Professional Supports: Organized support group (Comment), Other (Comment), Case Manager/Social Worker ([employer]-substance use support classes )   Employment: Unemployed   Type of Work: Unemployed   Education:  9 to 11 years   Financial Resources:  Medicaid   Other Resources:  Food Stamps , [employer]    Cultural/Religious Considerations Which May Impact Care:  Non-Denominational per face-sheet.   Strengths:  Pediatrician chosen , Compliance with medical plan , Home prepared for child  ([facility] )   Risk Factors/Current Problems:  Substance Use , Other (Comment) (MOB is currently living in a shelter with her children while she awaits housing through [place] housing authority )   Cognitive State:  Able to Concentrate , Alert , Goal Oriented , Insightful    Mood/Affect:  Calm , Comfortable , Interested    CSW Assessment: CSW met with MOB at bedside to complete assessment for consult regarding MOB's hx of depression and substance use. Upon this writers arrival, MOB was in bed watching TV while baby was asleep in basinet. This writer explained role and reasoning for visit. MOB was warm and welcoming. This writer  inquired about MOB's hx of depression and substance use. MOB noted she experienced depression after the death of her brother; however is no longer going through it. MOB confirms her hx of cocaine use and THC. She notes she has been clean of cocaine for a long time now; however, still uses THC from to time but is trying to quit. MOB notes she attends a support group in [place] called [employer]  [employer] which she finds very helpful. This writer discussed the hospitals policy and procedure regarding substance use and mandated report making for positive results. This writer informed MOB of the two drug screens taken to include UDS and CDS. This writer informed MOB that babys UDS results were negative; however, CDS results are still pending. MOB verbalized understanding. This writer informed MOB that she will be notified of a (+) CDS result if obtained and a report will be made.   This writer inquired about MOB's support system and FOB. MOB was very short about hx regarding FOB. MOB notes FOB is not involved. She identifies supports as her mother who helps her with her children from time to time. This writer inquired if her home was prepared for baby's arrival, MOB notes she is currently living in a shelter in [place] ([facility]) until she can secure housing. This writer inquired if she has some where for baby to sleep safely. MOB noted she has a pac-n-play for baby to sleep in until she can get a crib. This writer inquired if MOB would like housing resources.   she is already working on it and does not need any further resources. This Probation officer inquired if there are any barriers getting she and baby to and from doctors visit. MOB noted there are not. At this time, MOB noted she has no further questions/concerns. CSW will continue to follow pending CDS results.   CSW Plan/Description: Engineer, mining , Information/Referral to [employer] , No  Further Intervention Required/No Barriers to Discharge    [employer], MSW, [facility]  Office: [phone]

## 2016-05-13 ENCOUNTER — Ambulatory Visit (INDEPENDENT_AMBULATORY_CARE_PROVIDER_SITE_OTHER): Payer: Medicaid Other | Admitting: *Deleted

## 2016-05-13 DIAGNOSIS — Z111 Encounter for screening for respiratory tuberculosis: Secondary | ICD-10-CM

## 2016-05-13 NOTE — Progress Notes (Signed)
   PPD placed Left Forearm.  Pt to return 05/15/16 for reading.  Pt tolerated intradermal injection. Clovis Pu, RN

## 2016-05-15 ENCOUNTER — Ambulatory Visit (INDEPENDENT_AMBULATORY_CARE_PROVIDER_SITE_OTHER): Payer: Medicaid Other | Admitting: *Deleted

## 2016-05-15 DIAGNOSIS — Z111 Encounter for screening for respiratory tuberculosis: Secondary | ICD-10-CM

## 2016-05-15 LAB — TB SKIN TEST
Induration: 0 mm
TB Skin Test: NEGATIVE

## 2016-05-15 NOTE — Progress Notes (Signed)
PPD Reading Note PPD read and results entered in Epic Result: 0 mm induration. Interpretation: negative Letter given to patient for her place of employment.   Allergic reaction: no [name],CMA

## 2016-06-18 ENCOUNTER — Telehealth: Payer: Self-pay

## 2016-06-18 ENCOUNTER — Ambulatory Visit: Payer: Self-pay | Admitting: Obstetrics and Gynecology

## 2016-06-18 NOTE — Telephone Encounter (Signed)
Attempted to contact pt in regards to missed pp visit.  Unable LM due to phone kept ringing.  Letter sent.

## 2016-06-27 ENCOUNTER — Other Ambulatory Visit: Payer: Self-pay | Admitting: Family Medicine

## 2016-06-27 ENCOUNTER — Other Ambulatory Visit (HOSPITAL_COMMUNITY)
Admission: RE | Admit: 2016-06-27 | Discharge: 2016-06-27 | Disposition: A | Discharge status: Home / Self Care | Payer: Medicaid Other | Source: Ambulatory Visit | Attending: Family Medicine | Admitting: Family Medicine

## 2016-06-27 ENCOUNTER — Ambulatory Visit (INDEPENDENT_AMBULATORY_CARE_PROVIDER_SITE_OTHER): Payer: Medicaid Other | Admitting: Family Medicine

## 2016-06-27 ENCOUNTER — Telehealth: Payer: Self-pay | Admitting: Family Medicine

## 2016-06-27 ENCOUNTER — Encounter: Payer: Self-pay | Admitting: Family Medicine

## 2016-06-27 VITALS — BP 110/80 | Temp 98.6°F | Wt 135.0 lb

## 2016-06-27 DIAGNOSIS — Z113 Encounter for screening for infections with a predominantly sexual mode of transmission: Secondary | ICD-10-CM | POA: Diagnosis not present

## 2016-06-27 DIAGNOSIS — N898 Other specified noninflammatory disorders of vagina: Secondary | ICD-10-CM

## 2016-06-27 DIAGNOSIS — N912 Amenorrhea, unspecified: Secondary | ICD-10-CM

## 2016-06-27 LAB — POCT URINE PREGNANCY: Preg Test, Ur: NEGATIVE

## 2016-06-27 LAB — POCT WET PREP (WET MOUNT)
Clue Cells Wet Prep Whiff POC: NEGATIVE
Trichomonas Wet Prep HPF POC: ABSENT
WBC, Wet Prep HPF POC: >20

## 2016-06-27 MED ORDER — FLUCONAZOLE 150 MG PO TABS: 150.0000 mg | ORAL_TABLET | Freq: Once | ORAL | 0 refills | Status: AC

## 2016-06-27 MED ORDER — MEDROXYPROGESTERONE ACETATE 150 MG/ML IM SUSY
150.0000 mg | PREFILLED_SYRINGE | Freq: Once | INTRAMUSCULAR | Status: AC
  Administered 2016-06-27: 150 mg via INTRAMUSCULAR

## 2016-06-27 NOTE — Telephone Encounter (Signed)
Called patient, no answer, voicemail has not been set up yet.

## 2016-06-27 NOTE — Progress Notes (Signed)
   Subjective:   Rachel Lloyd is a 31 y.o. female with a history of Recurrent UTI, bipolar disorder, substance abuse here for same day appointment for  Chief Complaint  Patient presents with  . Vaginal Discharge     VAGINAL DISCHARGE  Having vaginal discharge for 2 days. Medications tried: none Discharge consistency: creamy Discharge color: white Recent antibiotic use: no Sex in last month: yes, sexually active with 1 female partner for the last 2 wks Possible STD exposure:yes  Symptoms Fever: no Dysuria:no, only burns when she wipes Vaginal bleeding: no Abdomen or Pelvic pain: no Back pain: no Genital sores or ulcers:no Rash: no Pain during sex: no  Patient is 7 weeks post-partum after vaginal delivery She is concerned that she could be pregnant again as she has had unprotected sex Lochia stopped ~2wks postpartum She states that she went for BTL but was told she was not scheduled She has postpartum visit next week and thinks she is getting BTL at that time  ROS see HPI Smoking Status noted  Objective:  BP 110/80   Temp 98.6 F (37 C) (Oral)   Wt 135 lb (61.2 kg)   BMI 24.69 kg/m   Gen:  31 y.o. female in NAD HEENT: NCAT, MMM, anicteric sclerae CV: Reg rhythm Resp: Non-labored Abd: Soft, NTND, BS present, no guarding or organomegaly GYN:  External genitalia with excoriations buit no rash.  Vaginal mucosa pink, moist, normal rugae.  Nonfriable cervix without lesions, + green discharge, no bleeding noted on speculum exam.  Bimanual exam revealed normal uterus, not boggy.  No cervical motion tenderness. No adnexal masses bilaterally.   Ext: WWP, no edema MSK: No obvious deformities, gait intact Neuro: Alert and oriented, speech normal      UPT negative Assessment & Plan:     Rachel Lloyd is a 31 y.o. female here for   Vaginal discharge Check GC/CT, wet prep, HIV, RPR today Treat as indicated Advised on vulvar health and hygiene Advised on safe  sex   Also given shot of Depomedrol today to avoid pregnancy in interim while awaiting BTL  Arielis Leonhart, Marzella SchleinAngela M, MD MPH PGY-3,  [facility] 06/27/2016  11:37 AM

## 2016-06-27 NOTE — Patient Instructions (Signed)

## 2016-06-27 NOTE — Assessment & Plan Note (Signed)
Check GC/CT, wet prep, HIV, RPR today Treat as indicated Advised on vulvar health and hygiene Advised on safe sex

## 2016-06-27 NOTE — Telephone Encounter (Signed)
Pt would like someone to call her with her lab results. ep

## 2016-06-27 NOTE — Telephone Encounter (Signed)
Patient informed of yeast on wet prep and diflucan sent to pharmacy. Informed her that she will be contacted next week when we have the rest of her results. Patient expressed understanding.

## 2016-06-28 LAB — RPR: RPR Ser Ql: NONREACTIVE

## 2016-06-28 LAB — HIV ANTIBODY (ROUTINE TESTING W REFLEX): HIV Screen 4th Generation wRfx: NONREACTIVE

## 2016-06-30 LAB — CERVICOVAGINAL ANCILLARY ONLY
Chlamydia: NEGATIVE
Neisseria Gonorrhea: NEGATIVE
Trichomonas: NEGATIVE

## 2016-07-02 ENCOUNTER — Ambulatory Visit: Payer: Self-pay | Admitting: Advanced Practice Midwife

## 2016-07-03 ENCOUNTER — Ambulatory Visit: Payer: Self-pay | Admitting: Family Medicine

## 2016-08-14 ENCOUNTER — Ambulatory Visit: Payer: Self-pay | Admitting: Advanced Practice Midwife

## 2016-08-18 ENCOUNTER — Encounter: Payer: Self-pay | Admitting: Family Medicine

## 2016-09-04 ENCOUNTER — Ambulatory Visit: Payer: Self-pay | Admitting: Obstetrics and Gynecology

## 2016-09-30 ENCOUNTER — Ambulatory Visit (INDEPENDENT_AMBULATORY_CARE_PROVIDER_SITE_OTHER): Payer: Medicaid Other | Admitting: *Deleted

## 2016-09-30 DIAGNOSIS — Z3042 Encounter for surveillance of injectable contraceptive: Secondary | ICD-10-CM | POA: Diagnosis present

## 2016-09-30 LAB — POCT URINE PREGNANCY: Preg Test, Ur: NEGATIVE

## 2016-09-30 MED ORDER — MEDROXYPROGESTERONE ACETATE 150 MG/ML IM SUSP
150.0000 mg | Freq: Once | INTRAMUSCULAR | Status: AC
  Administered 2016-09-30: 150 mg via INTRAMUSCULAR

## 2016-09-30 NOTE — Progress Notes (Signed)
   Pt late for Depo Provera injection. Pregnancy test ordered; result negative. Patient advised to take another pregnancy test in 1 week. To use another reliable form of birth control for the next 7-10 days. Pt tolerated Depo injection. Depo given left upper outer quadrant.  Next injection due December 4-18, 2018.  Reminder card given. Clovis Pu, RN

## 2016-10-18 ENCOUNTER — Emergency Department (HOSPITAL_COMMUNITY)
Admission: EM | Admit: 2016-10-18 | Discharge: 2016-10-18 | Disposition: A | Discharge status: Home / Self Care | Payer: Medicaid Other | Attending: Emergency Medicine | Admitting: Emergency Medicine

## 2016-10-18 ENCOUNTER — Encounter (HOSPITAL_COMMUNITY): Payer: Self-pay | Admitting: Emergency Medicine

## 2016-10-18 DIAGNOSIS — Z5321 Procedure and treatment not carried out due to patient leaving prior to being seen by health care provider: Secondary | ICD-10-CM | POA: Insufficient documentation

## 2016-10-18 DIAGNOSIS — M542 Cervicalgia: Secondary | ICD-10-CM | POA: Insufficient documentation

## 2016-10-18 NOTE — ED Notes (Signed)
Pt stated that she did not want to wait in the waiting room and would follow up tomorrow.
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
## 2016-10-18 NOTE — ED Notes (Signed)
PT TRIAGED IN PEDS WITH CHILD, WILL BE DIRECTED TO ADULT ED ONCE CHILD IS DISCHARGED.

## 2016-11-20 ENCOUNTER — Other Ambulatory Visit (HOSPITAL_COMMUNITY)
Admission: RE | Admit: 2016-11-20 | Discharge: 2016-11-20 | Disposition: A | Discharge status: Home / Self Care | Payer: Medicaid Other | Source: Ambulatory Visit | Attending: Family Medicine | Admitting: Family Medicine

## 2016-11-20 ENCOUNTER — Ambulatory Visit (INDEPENDENT_AMBULATORY_CARE_PROVIDER_SITE_OTHER): Payer: Medicaid Other | Admitting: Family Medicine

## 2016-11-20 ENCOUNTER — Other Ambulatory Visit: Payer: Self-pay

## 2016-11-20 ENCOUNTER — Encounter: Payer: Self-pay | Admitting: Family Medicine

## 2016-11-20 VITALS — BP 106/70 | HR 84 | Temp 98.6°F | Ht 62.0 in | Wt 132.0 lb

## 2016-11-20 DIAGNOSIS — Z202 Contact with and (suspected) exposure to infections with a predominantly sexual mode of transmission: Secondary | ICD-10-CM

## 2016-11-20 DIAGNOSIS — R21 Rash and other nonspecific skin eruption: Secondary | ICD-10-CM

## 2016-11-20 DIAGNOSIS — Z206 Contact with and (suspected) exposure to human immunodeficiency virus [HIV]: Secondary | ICD-10-CM

## 2016-11-20 LAB — POCT WET PREP (WET MOUNT)
Clue Cells Wet Prep Whiff POC: POSITIVE
Trichomonas Wet Prep HPF POC: ABSENT

## 2016-11-20 LAB — POCT URINE PREGNANCY: Preg Test, Ur: NEGATIVE

## 2016-11-20 MED ORDER — HYDROCORTISONE 1 % EX OINT: 1.0000 "application " | TOPICAL_OINTMENT | Freq: Two times a day (BID) | CUTANEOUS | 0 refills | Status: DC

## 2016-11-20 MED ORDER — METRONIDAZOLE 500 MG PO TABS: 500.0000 mg | ORAL_TABLET | Freq: Two times a day (BID) | ORAL | 0 refills | Status: AC
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
## 2016-11-20 NOTE — Patient Instructions (Signed)
Sexually Transmitted Disease  A sexually transmitted disease (STD) is a disease or infection that may be passed (transmitted) from person to person, usually during sexual activity. This may happen by way of saliva, semen, blood, vaginal mucus, or urine. Common STDs include:   Gonorrhea.   Chlamydia.   Syphilis.   HIV and AIDS.   Genital herpes.   Hepatitis B and C.   Trichomonas.   Human papillomavirus (HPV).   Pubic lice.   Scabies.   Mites.   Bacterial vaginosis.    What are the causes?  An STD may be caused by bacteria, a virus, or parasites. STDs are often transmitted during sexual activity if one person is infected. However, they may also be transmitted through nonsexual means. STDs may be transmitted after:   Sexual intercourse with an infected person.   Sharing sex toys with an infected person.   Sharing needles with an infected person or using unclean piercing or tattoo needles.   Having intimate contact with the genitals, mouth, or rectal areas of an infected person.   Exposure to infected fluids during birth.    What are the signs or symptoms?  Different STDs have different symptoms. Some people may not have any symptoms. If symptoms are present, they may include:   Painful or bloody urination.   Pain in the pelvis, abdomen, vagina, anus, throat, or eyes.   A skin rash, itching, or irritation.   Growths, ulcerations, blisters, or sores in the genital and anal areas.   Abnormal vaginal discharge with or without bad odor.   Penile discharge in men.   Fever.   Pain or bleeding during sexual intercourse.   Swollen glands in the groin area.   Yellow skin and eyes (jaundice). This is seen with hepatitis.   Swollen testicles.   Infertility.   Sores and blisters in the mouth.    How is this diagnosed?  To make a diagnosis, your health care provider may:   Take a medical history.   Perform a physical exam.   Take a sample of any discharge to examine.   Swab the throat, cervix,  opening to the penis, rectum, or vagina for testing.   Test a sample of your first morning urine.   Perform blood tests.   Perform a Pap test, if this applies.   Perform a colposcopy.   Perform a laparoscopy.    How is this treated?  Treatment depends on the STD. Some STDs may be treated but not cured.   Chlamydia, gonorrhea, trichomonas, and syphilis can be cured with antibiotic medicine.   Genital herpes, hepatitis, and HIV can be treated, but not cured, with prescribed medicines. The medicines lessen symptoms.   Genital warts from HPV can be treated with medicine or by freezing, burning (electrocautery), or surgery. Warts may come back.   HPV cannot be cured with medicine or surgery. However, abnormal areas may be removed from the cervix, vagina, or vulva.   If your diagnosis is confirmed, your recent sexual partners need treatment. This is true even if they are symptom-free or have a negative culture or evaluation. They should not have sex until their health care providers say it is okay.   Your health care provider may test you for infection again 3 months after treatment.    How is this prevented?  Take these steps to reduce your risk of getting an STD:   Use latex condoms, dental dams, and water-soluble lubricants during sexual activity. Do not use   petroleum jelly or oils.   Avoid having multiple sex partners.   Do not have sex with someone who has other sex partners.   Do not have sex with anyone you do not know or who is at high risk for an STD.   Avoid risky sex practices that can break your skin.   Do not have sex if you have open sores on your mouth or skin.   Avoid drinking too much alcohol or taking illegal drugs. Alcohol and drugs can affect your judgment and put you in a vulnerable position.   Avoid engaging in oral and anal sex acts.   Get vaccinated for HPV and hepatitis. If you have not received these vaccines in the past, talk to your health care provider about whether one or  both might be right for you.   If you are at risk of being infected with HIV, it is recommended that you take a prescription medicine daily to prevent HIV infection. This is called pre-exposure prophylaxis (PrEP). You are considered at risk if:  ? You are a man who has sex with other men (MSM).  ? You are a heterosexual man or woman and are sexually active with more than one partner.  ? You take drugs by injection.  ? You are sexually active with a partner who has HIV.   Talk with your health care provider about whether you are at high risk of being infected with HIV. If you choose to begin PrEP, you should first be tested for HIV. You should then be tested every 3 months for as long as you are taking PrEP.    Contact a health care provider if:   See your health care provider.   Tell your sexual partner(s). They should be tested and treated for any STDs.   Do not have sex until your health care provider says it is okay.  Get help right away if:  Contact your health care provider right away if:   You have severe abdominal pain.   You are a man and notice swelling or pain in your testicles.   You are a woman and notice swelling or pain in your vagina.    This information is not intended to replace advice given to you by your health care provider. Make sure you discuss any questions you have with your health care provider.  Document Released: 03/22/2002 Document Revised: 07/20/2015 Document Reviewed: 07/20/2012  Elsevier Interactive Patient Education  2018 [employer] Inc[employer]

## 2016-11-21 LAB — CERVICOVAGINAL ANCILLARY ONLY
Chlamydia: NEGATIVE
Neisseria Gonorrhea: NEGATIVE

## 2016-11-21 LAB — HIV ANTIBODY (ROUTINE TESTING W REFLEX): HIV Screen 4th Generation wRfx: NONREACTIVE

## 2016-11-21 LAB — RPR: RPR Ser Ql: NONREACTIVE

## 2017-02-02 ENCOUNTER — Encounter: Payer: Self-pay | Admitting: Internal Medicine

## 2017-02-02 ENCOUNTER — Ambulatory Visit: Payer: Medicaid Other | Admitting: Internal Medicine

## 2017-02-02 VITALS — BP 110/80 | HR 84 | Temp 98.1°F | Ht 62.0 in | Wt 126.6 lb

## 2017-02-02 DIAGNOSIS — Z3042 Encounter for surveillance of injectable contraceptive: Secondary | ICD-10-CM

## 2017-02-02 DIAGNOSIS — N898 Other specified noninflammatory disorders of vagina: Secondary | ICD-10-CM | POA: Diagnosis not present

## 2017-02-02 DIAGNOSIS — T300 Burn of unspecified body region, unspecified degree: Secondary | ICD-10-CM | POA: Diagnosis not present

## 2017-02-02 DIAGNOSIS — L853 Xerosis cutis: Secondary | ICD-10-CM

## 2017-02-02 DIAGNOSIS — R21 Rash and other nonspecific skin eruption: Secondary | ICD-10-CM

## 2017-02-02 LAB — POCT URINE PREGNANCY: Preg Test, Ur: NEGATIVE

## 2017-02-02 MED ORDER — HYDROCORTISONE 1 % EX OINT: 1.0000 "application " | TOPICAL_OINTMENT | Freq: Two times a day (BID) | CUTANEOUS | 0 refills | Status: DC

## 2017-02-02 MED ORDER — METRONIDAZOLE 0.75 % VA GEL: VAGINAL | 1 refills | Status: DC

## 2017-02-02 MED ORDER — MEDROXYPROGESTERONE ACETATE 150 MG/ML IM SUSP
150.0000 mg | Freq: Once | INTRAMUSCULAR | Status: AC
  Administered 2017-02-02: 150 mg via INTRAMUSCULAR
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
## 2017-02-02 NOTE — Progress Notes (Signed)
Patient was given Depo-Provera in the left upper outer quadrant and tolerated it well. Patient was told to come back for next injection no earlier than 04/20/2017 and no later than 05/04/2017.Glennie HawkSimpson, Michelle R, CMA
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
## 2017-02-02 NOTE — Assessment & Plan Note (Signed)
Reportedly with diagnosis of rosacea. Skin currently not dry or flaky, so difficulty to say if eczematous in appearance. Skin is red and patient's description of erythema more consistent with rosacea than with eczema. Hydrocortisone cream working well for patient currently, as her skin is drier than normal in the winter. Encouraged continued use of daily Vaseline at least BID. Refilled hydrocortisone cream today, however discussed importance of only using this as needed and relying on Vaseline instead as maintenance therapy.

## 2017-02-02 NOTE — Assessment & Plan Note (Signed)
Declining pelvic exam today, however patient with multiple episodes of BV, and reporting current symptoms consistent with past episodes of BV. Would like to begin maintenance therapy today. As patient reporting 4 episodes of BV in past year, maintenance therapy indicated.  - Begin MetroGel applied twice weekly before bedtime x6 months

## 2017-04-17 ENCOUNTER — Ambulatory Visit (INDEPENDENT_AMBULATORY_CARE_PROVIDER_SITE_OTHER): Payer: Medicaid Other | Admitting: Internal Medicine

## 2017-04-17 ENCOUNTER — Other Ambulatory Visit (HOSPITAL_COMMUNITY)
Admission: RE | Admit: 2017-04-17 | Discharge: 2017-04-17 | Disposition: A | Discharge status: Home / Self Care | Payer: Medicaid Other | Source: Ambulatory Visit | Attending: Family Medicine | Admitting: Family Medicine

## 2017-04-17 ENCOUNTER — Other Ambulatory Visit: Payer: Self-pay

## 2017-04-17 ENCOUNTER — Encounter: Payer: Self-pay | Admitting: Internal Medicine

## 2017-04-17 VITALS — BP 122/64 | HR 77 | Temp 98.5°F | Wt 125.0 lb

## 2017-04-17 DIAGNOSIS — N898 Other specified noninflammatory disorders of vagina: Secondary | ICD-10-CM

## 2017-04-17 DIAGNOSIS — Z124 Encounter for screening for malignant neoplasm of cervix: Secondary | ICD-10-CM

## 2017-04-17 DIAGNOSIS — Z113 Encounter for screening for infections with a predominantly sexual mode of transmission: Secondary | ICD-10-CM | POA: Diagnosis not present

## 2017-04-17 DIAGNOSIS — Z114 Encounter for screening for human immunodeficiency virus [HIV]: Secondary | ICD-10-CM

## 2017-04-17 DIAGNOSIS — A599 Trichomoniasis, unspecified: Secondary | ICD-10-CM | POA: Diagnosis not present

## 2017-04-17 LAB — POCT WET PREP (WET MOUNT)
Clue Cells Wet Prep Whiff POC: NEGATIVE
WBC, Wet Prep HPF POC: >20

## 2017-04-17 MED ORDER — METRONIDAZOLE 500 MG PO TABS
2000.0000 mg | ORAL_TABLET | Freq: Once | ORAL | Status: AC
  Administered 2017-04-17: 2000 mg via ORAL

## 2017-04-17 NOTE — Progress Notes (Signed)
   [facility] Phone: [phone]   Date of Visit: 04/17/2017   HPI:  STD Check: - patient report of vaginal discharge for 3-4 days  - she initially had vaginal itching after using Metrogel  - no vaginal odor  - is currently on Depo Provera - she usually does not have a period maybe after she gets her injection for Depo. For the past few days, notes of postcoital bleeding.  -No dysuria, fevers, or chills  ROS: See HPI.  PMFSH:  PMH: MDD  PHYSICAL EXAM: BP 122/64   Pulse 77   Temp 98.5 F (36.9 C) (Oral)   Wt 125 lb (56.7 kg)   SpO2 99%   Breastfeeding? No   BMI 22.86 kg/m  GEN: NAD  CV: RRR, no murmurs, rubs, or gallops PULM: CTAB, normal effort ABD: Soft, nontender, nondistended, NABS, no organomegaly GN:FAOZHYGU:Female genitalia: normal external genitalia, vulva, vagina, cervix, uterus and adnexa. Green-white discharge noted in vaginal vault.  No cervical motion tenderness. SKIN: No rash or cyanosis; warm and well-perfused PSYCH: Mood and affect euthymic, normal rate and volume of speech  ASSESSMENT/PLAN:  Trichomonas vaginalis: Wet prep positive for trichomonas.   patient treated with metronidazole 2 g p.o. x1 in clinic.  Discussed abstaining from intercourse for 7 days and until after partner gets treated.  Will need a retest within 3 months to ensure this was properly treated. -Gonorrhea and Chlamydia testing -HIV and RPR  Cervical cancer screening: Last Pap was in 2016 with normal cytology.  HPV not tested as patient was less than 430 years of age at that time.  Completed Pap today. I think her postcoital bleeding is likely due to her trichomonas infection.  She continues to have postcoital bleeding after treatment then she would need further evaluation.  Palma HolterKanishka G Aristide Waggle, MD PGY 3 [facility]

## 2017-04-17 NOTE — Patient Instructions (Addendum)
We did a pap smear today  I will call you with the rest of the results.  Make sure you tell your partner to get tested and treated.  Wait 7 days at least to be sexually active again

## 2017-04-20 LAB — CERVICOVAGINAL ANCILLARY ONLY
Chlamydia: POSITIVE — AB
Neisseria Gonorrhea: POSITIVE — AB

## 2017-04-21 ENCOUNTER — Telehealth: Payer: Self-pay | Admitting: Internal Medicine

## 2017-04-21 LAB — CYTOLOGY - PAP
Diagnosis: NEGATIVE
HPV: NOT DETECTED

## 2017-04-21 NOTE — Telephone Encounter (Signed)
Wrong number in chart- When pt calls back about her std results, please update her chart with the correct phone number.

## 2017-04-21 NOTE — Telephone Encounter (Signed)
Attempted to call patient but apparently we have the wrong number. I called the emergency contact number Paulene Floor(Mary Martin) and left a message asking to call back.   Patient is positive for gonorrhea and chlamydia. She will need to make a nurse visit to get the following treatment:  Ceftriaxone 250mg  IM x 1 (patient has an allergy to Penicillins but per chart review, has tolerated Ceftriaxone in the past) AND Azithromycin 1g PO x 1.   Also sent a letter reporting the clinic is trying to get in touch and to call back.   Will route to blue team

## 2017-04-22 ENCOUNTER — Telehealth: Payer: Self-pay | Admitting: Family Medicine

## 2017-04-22 ENCOUNTER — Telehealth: Payer: Self-pay | Admitting: Internal Medicine

## 2017-04-22 NOTE — Telephone Encounter (Signed)
Pt called and said she was returning a missed call. She would like to have her results from her STD test last week. I saw in a note that her phone number needed to be updated, I asked her for the best number to contact her and have updated. Please contact patient.

## 2017-04-22 NOTE — Telephone Encounter (Signed)
Error

## 2017-04-23 ENCOUNTER — Ambulatory Visit (INDEPENDENT_AMBULATORY_CARE_PROVIDER_SITE_OTHER): Payer: Medicaid Other

## 2017-04-23 DIAGNOSIS — A64 Unspecified sexually transmitted disease: Secondary | ICD-10-CM | POA: Diagnosis present

## 2017-04-23 MED ORDER — CEFTRIAXONE SODIUM 250 MG IJ SOLR
250.0000 mg | Freq: Once | INTRAMUSCULAR | Status: AC
  Administered 2017-04-23: 250 mg via INTRAMUSCULAR

## 2017-04-23 MED ORDER — AZITHROMYCIN 500 MG PO TABS
500.0000 mg | ORAL_TABLET | Freq: Once | ORAL | Status: AC
  Administered 2017-04-23: 1000 mg via ORAL

## 2017-04-23 MED ORDER — CEFTRIAXONE SODIUM 250 MG IJ SOLR: 250.0000 mg | Freq: Once | INTRAMUSCULAR | 0 refills | Status: AC

## 2017-04-23 NOTE — Telephone Encounter (Signed)
Patient informed of results.  See previous phone note. [name],CMA

## 2017-04-23 NOTE — Telephone Encounter (Signed)
Saw other phone note that patient called back and updated her number.  I called her and made her aware of results and she made an appointment today for a nurse visit.  Will forward to RN team.  Burnard HawthorneJazmin Chevie Birkhead,CMA

## 2017-04-23 NOTE — Progress Notes (Signed)
Patient in nurse clinic today for STD treatment of gonorrhea and chlamydia  treatment.  Patient advised to abstain from sex for 7-10 days after treatment or when partner has been tested/treated.  Azithromycin 1 GM PO x 1 given and Ceftriaxone 250 mg IM x 1 given in [facility] per Dr. Deland PrettyGunadasa's orders.  Patient to follow up in 2-3 months for re-screening.  Advised to use condoms with all sexual activity.  STD [facility] fax [facility] [phone] (STD department).  Patient verbalized understanding.  Shawna OrleansMeredith B Thomsen, RN

## 2017-04-24 ENCOUNTER — Encounter: Payer: Self-pay | Admitting: Internal Medicine

## 2017-04-24 LAB — RPR: RPR Ser Ql: NONREACTIVE

## 2017-04-24 LAB — HIV ANTIBODY (ROUTINE TESTING W REFLEX): HIV Screen 4th Generation wRfx: NONREACTIVE

## 2017-04-24 NOTE — Progress Notes (Signed)
Sent letter with negative HIV and RPR

## 2017-05-12 ENCOUNTER — Other Ambulatory Visit (HOSPITAL_COMMUNITY)
Admission: RE | Admit: 2017-05-12 | Discharge: 2017-05-12 | Disposition: A | Discharge status: Home / Self Care | Payer: Medicaid Other | Source: Ambulatory Visit | Attending: Family Medicine | Admitting: Family Medicine

## 2017-05-12 ENCOUNTER — Other Ambulatory Visit: Payer: Self-pay

## 2017-05-12 ENCOUNTER — Ambulatory Visit (INDEPENDENT_AMBULATORY_CARE_PROVIDER_SITE_OTHER): Payer: Medicaid Other | Admitting: Internal Medicine

## 2017-05-12 ENCOUNTER — Encounter: Payer: Self-pay | Admitting: Internal Medicine

## 2017-05-12 VITALS — BP 104/68 | HR 74 | Temp 98.2°F | Wt 127.0 lb

## 2017-05-12 DIAGNOSIS — N898 Other specified noninflammatory disorders of vagina: Secondary | ICD-10-CM | POA: Diagnosis present

## 2017-05-12 LAB — POCT WET PREP (WET MOUNT)
Clue Cells Wet Prep Whiff POC: NEGATIVE
Trichomonas Wet Prep HPF POC: ABSENT

## 2017-05-12 MED ORDER — CEFTRIAXONE SODIUM 250 MG IJ SOLR
250.0000 mg | Freq: Once | INTRAMUSCULAR | Status: AC
  Administered 2017-05-12: 250 mg via INTRAMUSCULAR

## 2017-05-12 MED ORDER — AZITHROMYCIN 500 MG PO TABS
1000.0000 mg | ORAL_TABLET | Freq: Once | ORAL | Status: AC
  Administered 2017-05-12: 1000 mg via ORAL

## 2017-05-12 MED ORDER — FLUCONAZOLE 150 MG PO TABS: 150.0000 mg | ORAL_TABLET | Freq: Once | ORAL | 0 refills | Status: AC
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
## 2017-05-12 NOTE — Patient Instructions (Signed)
We treated you again You have a yeast infection. I sent a prescription to pharmacy for Diflucan Follow up in 3 months for retesting.

## 2017-05-14 ENCOUNTER — Encounter: Payer: Self-pay | Admitting: Internal Medicine

## 2017-05-14 LAB — CERVICOVAGINAL ANCILLARY ONLY
Chlamydia: NEGATIVE
Neisseria Gonorrhea: POSITIVE — AB

## 2017-06-09 ENCOUNTER — Ambulatory Visit (INDEPENDENT_AMBULATORY_CARE_PROVIDER_SITE_OTHER): Payer: Medicaid Other

## 2017-06-09 DIAGNOSIS — Z111 Encounter for screening for respiratory tuberculosis: Secondary | ICD-10-CM

## 2017-06-09 NOTE — Addendum Note (Signed)
Addended by: Blair Promise on: 06/09/2017 02:43 PM   Modules accepted: Orders

## 2017-06-09 NOTE — Progress Notes (Signed)
Tuberculin skin test applied to left ventral forearm.  Patient informed to schedule appt for nurse visit in 48-72 hours to have site read.  Meredith B Thomsen, RN   

## 2017-06-11 ENCOUNTER — Ambulatory Visit: Payer: Medicaid Other

## 2017-06-11 DIAGNOSIS — Z111 Encounter for screening for respiratory tuberculosis: Secondary | ICD-10-CM

## 2017-06-11 LAB — TB SKIN TEST: TB Skin Test: NEGATIVE

## 2017-06-11 NOTE — Progress Notes (Signed)
PT here for PPD reading. Negative results, 0mm induration. Letter with results given to pt. Shawna Orleans, RN

## 2017-06-22 ENCOUNTER — Encounter: Payer: Self-pay | Admitting: Internal Medicine

## 2017-06-22 ENCOUNTER — Other Ambulatory Visit (HOSPITAL_COMMUNITY)
Admission: RE | Admit: 2017-06-22 | Discharge: 2017-06-22 | Disposition: A | Discharge status: Home / Self Care | Payer: Medicaid Other | Source: Ambulatory Visit | Attending: Family Medicine | Admitting: Family Medicine

## 2017-06-22 ENCOUNTER — Ambulatory Visit (INDEPENDENT_AMBULATORY_CARE_PROVIDER_SITE_OTHER): Payer: Medicaid Other | Admitting: Internal Medicine

## 2017-06-22 ENCOUNTER — Other Ambulatory Visit: Payer: Self-pay

## 2017-06-22 VITALS — BP 128/78 | HR 53 | Temp 98.5°F | Wt 126.8 lb

## 2017-06-22 DIAGNOSIS — Z309 Encounter for contraceptive management, unspecified: Secondary | ICD-10-CM | POA: Diagnosis not present

## 2017-06-22 DIAGNOSIS — Z3202 Encounter for pregnancy test, result negative: Secondary | ICD-10-CM | POA: Diagnosis not present

## 2017-06-22 DIAGNOSIS — Z3042 Encounter for surveillance of injectable contraceptive: Secondary | ICD-10-CM | POA: Diagnosis not present

## 2017-06-22 DIAGNOSIS — Z113 Encounter for screening for infections with a predominantly sexual mode of transmission: Secondary | ICD-10-CM | POA: Insufficient documentation

## 2017-06-22 DIAGNOSIS — R0982 Postnasal drip: Secondary | ICD-10-CM | POA: Diagnosis not present

## 2017-06-22 LAB — POCT URINE PREGNANCY: Preg Test, Ur: NEGATIVE

## 2017-06-22 MED ORDER — CETIRIZINE HCL 10 MG PO TABS: 10.0000 mg | ORAL_TABLET | Freq: Every day | ORAL | 5 refills | Status: DC

## 2017-06-22 MED ORDER — MEDROXYPROGESTERONE ACETATE 150 MG/ML IM SUSY
150.0000 mg | PREFILLED_SYRINGE | Freq: Once | INTRAMUSCULAR | Status: AC
  Administered 2017-06-22: 150 mg via INTRAMUSCULAR

## 2017-06-22 MED ORDER — FLUTICASONE PROPIONATE 50 MCG/ACT NA SUSP: 2.0000 | Freq: Every day | NASAL | 6 refills | Status: DC

## 2017-06-22 NOTE — Progress Notes (Signed)
   [facility] Noralee CharsAsiyah Jadakiss Barish, MD Phone: [phone]  Reason For Visit: SDA for Sore Throat   # Patient with presenting with sore throat for 4-5 days. Indicates having cough up mucous, noticed some blood in sputum x 1.  Recently positive for 3 STDs, is unsure if her partner has been treated. She has been having oral sex and is concern about infection.  No fevers, no congestion   Past Medical History Reviewed problem list.  Medications- reviewed and updated No additions to family history Social history- patient is a non-smoker   Objective: BP 128/78   Pulse (!) 53   Temp 98.5 F (36.9 C) (Oral)   Wt 126 lb 12.8 oz (57.5 kg)   SpO2 99%   BMI 23.19 kg/m  Gen: NAD, alert, cooperative with exam HEENT: Normal    Neck: No masses palpated. No lymphadenopathy    Nose: nasal turbinates congested     Throat: post nasal drip noted.  Cardio: regular rate and rhythm, S1S2 heard, no murmurs appreciated Pulm: clear to auscultation bilaterally, no wheezes, rhonchi or rales Skin: dry, intact, no rashes or lesions   Assessment/Plan: See problem based a/p  Post-nasal drip Likely post nasal drip from viral URI, will check for gonococcal infection  - fluticasone (FLONASE) 50 MCG/ACT nasal spray; Place 2 sprays into both nostrils daily.  Dispense: 16 g; Refill: 6 - cetirizine (ZYRTEC) 10 MG tablet; Take 1 tablet (10 mg total) by mouth daily.  Dispense: 30 tablet; Refill: 5 - Cervicovaginal ancillary only - Follow up as needed

## 2017-06-22 NOTE — Patient Instructions (Signed)
Likely you have nasal drip which is causing your sore throat and congestion.  I am going to prescribe you Flonase and Zyrtec to help with that.  I will also check you for STDs associated with oral sex.

## 2017-06-22 NOTE — Assessment & Plan Note (Signed)
Likely post nasal drip from viral URI, will check for gonococcal infection  - fluticasone (FLONASE) 50 MCG/ACT nasal spray; Place 2 sprays into both nostrils daily.  Dispense: 16 g; Refill: 6 - cetirizine (ZYRTEC) 10 MG tablet; Take 1 tablet (10 mg total) by mouth daily.  Dispense: 30 tablet; Refill: 5 - Cervicovaginal ancillary only - Follow up as needed

## 2017-06-23 LAB — CERVICOVAGINAL ANCILLARY ONLY
Chlamydia: NEGATIVE
Neisseria Gonorrhea: NEGATIVE

## 2017-06-24 ENCOUNTER — Telehealth: Payer: Self-pay | Admitting: Internal Medicine

## 2017-06-24 NOTE — Telephone Encounter (Signed)
Please call patient and let her know her oral swab for Gonorrhea was negative. Rachel Lloyd

## 2017-06-24 NOTE — Telephone Encounter (Signed)
Pt informed. Quanisha Drewry, CMA  

## 2017-07-06 ENCOUNTER — Ambulatory Visit (INDEPENDENT_AMBULATORY_CARE_PROVIDER_SITE_OTHER): Payer: Medicaid Other | Admitting: Internal Medicine

## 2017-07-06 ENCOUNTER — Encounter: Payer: Self-pay | Admitting: Internal Medicine

## 2017-07-06 ENCOUNTER — Other Ambulatory Visit: Payer: Self-pay

## 2017-07-06 DIAGNOSIS — T304 Corrosion of unspecified body region, unspecified degree: Secondary | ICD-10-CM | POA: Insufficient documentation
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
## 2017-07-22 ENCOUNTER — Other Ambulatory Visit: Payer: Self-pay

## 2017-07-22 ENCOUNTER — Ambulatory Visit (HOSPITAL_COMMUNITY)
Admission: EM | Admit: 2017-07-22 | Discharge: 2017-07-22 | Disposition: A | Discharge status: Home / Self Care | Payer: Medicaid Other | Attending: Family Medicine | Admitting: Family Medicine

## 2017-07-22 ENCOUNTER — Encounter (HOSPITAL_COMMUNITY): Payer: Self-pay | Admitting: Emergency Medicine

## 2017-07-22 DIAGNOSIS — Z113 Encounter for screening for infections with a predominantly sexual mode of transmission: Secondary | ICD-10-CM

## 2017-07-22 DIAGNOSIS — F17210 Nicotine dependence, cigarettes, uncomplicated: Secondary | ICD-10-CM | POA: Diagnosis not present

## 2017-07-22 DIAGNOSIS — Z3202 Encounter for pregnancy test, result negative: Secondary | ICD-10-CM | POA: Diagnosis not present

## 2017-07-22 DIAGNOSIS — N898 Other specified noninflammatory disorders of vagina: Secondary | ICD-10-CM

## 2017-07-22 DIAGNOSIS — Z202 Contact with and (suspected) exposure to infections with a predominantly sexual mode of transmission: Secondary | ICD-10-CM | POA: Diagnosis not present

## 2017-07-22 LAB — POCT URINALYSIS DIP (DEVICE)
Bilirubin Urine: NEGATIVE
Glucose, UA: NEGATIVE mg/dL
Hgb urine dipstick: NEGATIVE
Ketones, ur: NEGATIVE mg/dL
Nitrite: NEGATIVE
Protein, ur: NEGATIVE mg/dL
Specific Gravity, Urine: 1.015 (ref 1.005–1.030)
Urobilinogen, UA: 0.2 mg/dL (ref 0.0–1.0)
pH: 6.5 (ref 5.0–8.0)

## 2017-07-22 LAB — POCT PREGNANCY, URINE: Preg Test, Ur: NEGATIVE

## 2017-07-22 MED ORDER — AZITHROMYCIN 250 MG PO TABS
ORAL_TABLET | ORAL | Status: AC
  Filled 2017-07-22: qty 4

## 2017-07-22 MED ORDER — METRONIDAZOLE 500 MG PO TABS: 500.0000 mg | ORAL_TABLET | Freq: Two times a day (BID) | ORAL | 0 refills | Status: DC

## 2017-07-22 MED ORDER — CEFTRIAXONE SODIUM 250 MG IJ SOLR
INTRAMUSCULAR | Status: AC
  Filled 2017-07-22: qty 250

## 2017-07-22 MED ORDER — LIDOCAINE HCL (PF) 1 % IJ SOLN
INTRAMUSCULAR | Status: AC
  Filled 2017-07-22: qty 2

## 2017-07-22 MED ORDER — CEFTRIAXONE SODIUM 250 MG IJ SOLR
250.0000 mg | Freq: Once | INTRAMUSCULAR | Status: AC
  Administered 2017-07-22: 250 mg via INTRAMUSCULAR

## 2017-07-22 MED ORDER — AZITHROMYCIN 250 MG PO TABS
1000.0000 mg | ORAL_TABLET | Freq: Once | ORAL | Status: AC
  Administered 2017-07-22: 1000 mg via ORAL

## 2017-07-22 NOTE — Discharge Instructions (Signed)

## 2017-07-22 NOTE — ED Provider Notes (Signed)
[facility]   [id number] 07/22/17 Arrival Time: 1835  ASSESSMENT & PLAN:  1. Vaginal discharge       Discharge Instructions     You have been given the following medications today for treatment of suspected gonorrhea and/or chlamydia:  cefTRIAXone (ROCEPHIN) injection 250 mg azithromycin (ZITHROMAX) tablet 1,000 mg  Even though we have treated you today, we have sent testing for sexually transmitted infections. We will notify you of any positive results once they are received. If required, we will prescribe any medications you might need.  Please refrain from all sexual activity for at least the next seven days.     Pending: Labs Reviewed  POCT URINALYSIS DIP (DEVICE) - Abnormal; Notable for the following components:      Result Value   Leukocytes, UA TRACE (*)    All other components within normal limits  POCT PREGNANCY, URINE  CERVICOVAGINAL ANCILLARY ONLY    Will notify of any positive results. Instructed to refrain from sexual activity for at least seven days.  Reviewed expectations re: course of current medical issues. Questions answered. Outlined signs and symptoms indicating need for more acute intervention. Patient verbalized understanding. After Visit Summary given.   SUBJECTIVE:  Rachel Lloyd is a 32 y.o. female who presents with complaint of vaginal discharge. Onset abrupt, 2 days ago. Describes discharge as thick and white/yellow with odor. Urinary symptoms: none reported. Afebrile. No abdominal or pelvic pain. No n/v. No rashes or lesions. Sexually active with single female partner. OTC treatment: none. Worried about STDs. History of similar symptoms: No.  No LMP recorded. Patient has had an injection.  ROS: As per HPI.  OBJECTIVE:  Vitals:   07/22/17 1920  BP: 111/69  Pulse: 75  Resp: 18  Temp: 99.5 F (37.5 C)  TempSrc: Oral  SpO2: 100%     General appearance: alert, cooperative, appears stated age and no distress Throat:  lips, mucosa, and tongue normal; teeth and gums normal Back: no CVA tenderness Abdomen: soft, non-tender; bowel sounds normal; no masses or organomegaly; no guarding or rebound tenderness GU: declines Skin: warm and dry Psychological:  Alert and cooperative. Normal mood and affect.  Results for orders placed or performed during the hospital encounter of 07/22/17  Pregnancy, urine POC  Result Value Ref Range   Preg Test, Ur NEGATIVE NEGATIVE  POCT urinalysis dip (device)  Result Value Ref Range   Glucose, UA NEGATIVE NEGATIVE mg/dL   Bilirubin Urine NEGATIVE NEGATIVE   Ketones, ur NEGATIVE NEGATIVE mg/dL   Specific Gravity, Urine 1.015 1.005 - 1.030   Hgb urine dipstick NEGATIVE NEGATIVE   pH 6.5 5.0 - 8.0   Protein, ur NEGATIVE NEGATIVE mg/dL   Urobilinogen, UA 0.2 0.0 - 1.0 mg/dL   Nitrite NEGATIVE NEGATIVE   Leukocytes, UA TRACE (A) NEGATIVE    Labs Reviewed  POCT URINALYSIS DIP (DEVICE) - Abnormal; Notable for the following components:      Result Value   Leukocytes, UA TRACE (*)    All other components within normal limits  POCT PREGNANCY, URINE  CERVICOVAGINAL ANCILLARY ONLY    Allergies  Allergen Reactions  . Penicillins Anaphylaxis    Has taken cephalosprorins Has patient had a PCN reaction causing immediate rash, facial/tongue/throat swelling, SOB or lightheadedness with hypotension: Yes Has patient had a PCN reaction causing severe rash involving mucus membranes or skin necrosis: Yes Has patient had a PCN reaction that required hospitalization Yes Has patient had a PCN reaction occurring within the last 10  years: No If all of the above answers are "NO", then may proceed with Cephalosporin use.  . Latex Itching and Rash    Past Medical History:  Diagnosis Date  . Chlamydia   . Hypertension    During second pregnancy  . Mental disorder 2010   bipolar  . Pneumonia   . Trichimoniasis    Family History  Problem Relation Age of Onset  . Hypertension Mother    . Cancer Mother   . Fibroids Mother   . ADD / ADHD Brother   . Hypertension Maternal Grandmother   . Anesthesia problems Neg Hx    Social History   Socioeconomic History  . Marital status: Single    Spouse name: Not on file  . Number of children: Not on file  . Years of education: Not on file  . Highest education level: Not on file  Occupational History  . Not on file  Social Needs  . Financial resource strain: Not on file  . Food insecurity:    Worry: Not on file    Inability: Not on file  . Transportation needs:    Medical: Not on file    Non-medical: Not on file  Tobacco Use  . Smoking status: Current Some Day Smoker    Types: Cigarettes    Last attempt to quit: 04/04/2010    Years since quitting: 7.3  . Smokeless tobacco: Never Used  Substance and Sexual Activity  . Alcohol use: No    Comment: quit when found out was pregnant  . Drug use: Yes    Types: Marijuana, Cocaine    Comment: last used cocaine 7 months ago, +THC  . Sexual activity: Not Currently    Birth control/protection: Other-see comments    Comment: getting tubal after discharge  Lifestyle  . Physical activity:    Days per week: Not on file    Minutes per session: Not on file  . Stress: Not on file  Relationships  . Social connections:    Talks on phone: Not on file    Gets together: Not on file    Attends religious service: Not on file    Active member of club or organization: Not on file    Attends meetings of clubs or organizations: Not on file    Relationship status: Not on file  . Intimate partner violence:    Fear of current or ex partner: Not on file    Emotionally abused: Not on file    Physically abused: Not on file    Forced sexual activity: Not on file  Other Topics Concern  . Not on file  Social History Narrative  . Not on file          Mardella Layman, MD 07/23/17 1043

## 2017-07-22 NOTE — ED Triage Notes (Signed)
Vaginal discharge and odor.  Odor noticed 2 days ago, discharge noticed today

## 2017-07-23 LAB — CERVICOVAGINAL ANCILLARY ONLY
Bacterial vaginitis: POSITIVE — AB
Candida vaginitis: NEGATIVE
Chlamydia: NEGATIVE
Neisseria Gonorrhea: NEGATIVE
Trichomonas: NEGATIVE

## 2017-07-27 ENCOUNTER — Telehealth (HOSPITAL_COMMUNITY): Payer: Self-pay

## 2017-07-27 MED ORDER — METRONIDAZOLE 0.75 % VA GEL: 1.0000 | Freq: Every day | VAGINAL | 0 refills | Status: AC

## 2017-07-27 NOTE — Telephone Encounter (Signed)
Bacterial vaginosis is positive. This was not treated at the urgent care visit.  Patient complains of persistent symptoms.  Flagyl vaginal gel rx sent to pharmacy of choice per Dr. Dayton ScrapeMurray.  Pt called and made aware of results and new prescription. Answered all questions and pt verbalized understanding.

## 2017-08-01 ENCOUNTER — Encounter (HOSPITAL_COMMUNITY): Payer: Self-pay | Admitting: Emergency Medicine

## 2017-08-01 ENCOUNTER — Emergency Department (HOSPITAL_COMMUNITY)
Admission: EM | Admit: 2017-08-01 | Discharge: 2017-08-02 | Disposition: A | Discharge status: Home / Self Care | Payer: Medicaid Other | Attending: Emergency Medicine | Admitting: Emergency Medicine

## 2017-08-01 DIAGNOSIS — S41011A Laceration without foreign body of right shoulder, initial encounter: Secondary | ICD-10-CM | POA: Insufficient documentation

## 2017-08-01 DIAGNOSIS — S0083XA Contusion of other part of head, initial encounter: Secondary | ICD-10-CM | POA: Insufficient documentation

## 2017-08-01 DIAGNOSIS — Y998 Other external cause status: Secondary | ICD-10-CM | POA: Diagnosis not present

## 2017-08-01 DIAGNOSIS — Y939 Activity, unspecified: Secondary | ICD-10-CM | POA: Insufficient documentation

## 2017-08-01 DIAGNOSIS — Z79899 Other long term (current) drug therapy: Secondary | ICD-10-CM | POA: Diagnosis not present

## 2017-08-01 DIAGNOSIS — I10 Essential (primary) hypertension: Secondary | ICD-10-CM | POA: Diagnosis not present

## 2017-08-01 DIAGNOSIS — S0990XA Unspecified injury of head, initial encounter: Secondary | ICD-10-CM

## 2017-08-01 DIAGNOSIS — Z87891 Personal history of nicotine dependence: Secondary | ICD-10-CM | POA: Diagnosis not present

## 2017-08-01 DIAGNOSIS — Y929 Unspecified place or not applicable: Secondary | ICD-10-CM | POA: Insufficient documentation

## 2017-08-01 MED ORDER — OXYCODONE-ACETAMINOPHEN 5-325 MG PO TABS
1.0000 | ORAL_TABLET | Freq: Once | ORAL | Status: AC
  Administered 2017-08-01: 1 via ORAL
  Filled 2017-08-01: qty 1
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
## 2017-08-02 ENCOUNTER — Emergency Department (HOSPITAL_COMMUNITY): Payer: Medicaid Other

## 2017-08-02 MED ORDER — IBUPROFEN 800 MG PO TABS: 800.0000 mg | ORAL_TABLET | Freq: Four times a day (QID) | ORAL | 0 refills | Status: DC | PRN

## 2017-08-02 MED ORDER — TRAMADOL HCL 50 MG PO TABS: 50.0000 mg | ORAL_TABLET | Freq: Four times a day (QID) | ORAL | 0 refills | Status: DC | PRN

## 2017-08-02 MED ORDER — LIDOCAINE-EPINEPHRINE (PF) 2 %-1:200000 IJ SOLN
20.0000 mL | Freq: Once | INTRAMUSCULAR | Status: AC
  Administered 2017-08-02: 20 mL
  Filled 2017-08-02: qty 20

## 2017-08-02 NOTE — ED Notes (Signed)
Wound to right shoulder cleaned and dressed with telfa, gauze and large tape

## 2017-08-02 NOTE — ED Notes (Signed)
Pt ready for discharge VS documented IV removed 

## 2017-08-04 ENCOUNTER — Other Ambulatory Visit: Payer: Self-pay

## 2017-08-04 ENCOUNTER — Ambulatory Visit (INDEPENDENT_AMBULATORY_CARE_PROVIDER_SITE_OTHER): Payer: Medicaid Other | Admitting: Family Medicine

## 2017-08-04 DIAGNOSIS — S41011A Laceration without foreign body of right shoulder, initial encounter: Secondary | ICD-10-CM | POA: Diagnosis not present

## 2017-08-04 DIAGNOSIS — W503XXA Accidental bite by another person, initial encounter: Secondary | ICD-10-CM

## 2017-08-04 MED ORDER — CLINDAMYCIN HCL 300 MG PO CAPS: 300.0000 mg | ORAL_CAPSULE | Freq: Three times a day (TID) | ORAL | 0 refills | Status: DC
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
## 2017-08-05 DIAGNOSIS — W503XXA Accidental bite by another person, initial encounter: Secondary | ICD-10-CM | POA: Insufficient documentation

## 2017-08-05 DIAGNOSIS — S41011A Laceration without foreign body of right shoulder, initial encounter: Secondary | ICD-10-CM | POA: Insufficient documentation
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
## 2017-08-13 ENCOUNTER — Ambulatory Visit (INDEPENDENT_AMBULATORY_CARE_PROVIDER_SITE_OTHER): Payer: Medicaid Other

## 2017-08-13 DIAGNOSIS — S41011A Laceration without foreign body of right shoulder, initial encounter: Secondary | ICD-10-CM

## 2017-08-13 NOTE — Progress Notes (Signed)
   Patient in to nurse clinic for suture removal right shoulder. Wound healed, no signs/symptoms of infection. 4 sutures removed without difficulty. No band aid applied as patient appears to have some skin irritation from the medical tape from her dressing. Ples SpecterAlisa Vanette Noguchi, RN [facility] RN)

## 2017-08-21 ENCOUNTER — Encounter: Payer: Self-pay | Admitting: Family Medicine

## 2017-08-21 ENCOUNTER — Ambulatory Visit (INDEPENDENT_AMBULATORY_CARE_PROVIDER_SITE_OTHER): Payer: Medicaid Other | Admitting: Family Medicine

## 2017-08-21 VITALS — BP 100/65 | HR 68 | Temp 99.2°F | Ht 62.0 in | Wt 123.2 lb

## 2017-08-21 DIAGNOSIS — M79645 Pain in left finger(s): Secondary | ICD-10-CM | POA: Diagnosis not present

## 2017-08-25 DIAGNOSIS — M79645 Pain in left finger(s): Secondary | ICD-10-CM | POA: Insufficient documentation
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
## 2017-08-25 NOTE — Assessment & Plan Note (Signed)
Given how long this has been going on, the nature of her injury and lack of external findings the patient likely needs an MRI to better assess her thumb. She unfortunately said that she had to leave as her "kids were in the car" prior to her actually getting set up for this. Will start with left hand and thumb xrays. If unable to find anything significant, will then proceed to MRI.

## 2017-09-02 ENCOUNTER — Ambulatory Visit (HOSPITAL_COMMUNITY)
Admission: RE | Admit: 2017-09-02 | Discharge: 2017-09-02 | Disposition: A | Discharge status: Home / Self Care | Payer: Medicaid Other | Source: Ambulatory Visit | Attending: Family Medicine | Admitting: Family Medicine

## 2017-09-02 DIAGNOSIS — M79645 Pain in left finger(s): Secondary | ICD-10-CM | POA: Diagnosis present

## 2017-09-08 IMAGING — US US OB COMP LESS 14 WK
1 series · 15 of 28 positions shown · non-contrast
Comparison: None.

CLINICAL DATA: Pelvic pain.  Hyperemesis

EXAM:
OBSTETRIC <14 WK US AND TRANSVAGINAL OB US
TECHNIQUE: Both transabdominal and transvaginal ultrasound examinations were
performed for complete evaluation of the gestation as well as the
maternal uterus, adnexal regions, and pelvic cul-de-sac.
Transvaginal technique was performed to assess early pregnancy.

[Series 1: us ob comp less 14 wk · 61 acquisitions, 15 frames shown]
[im 1/61]
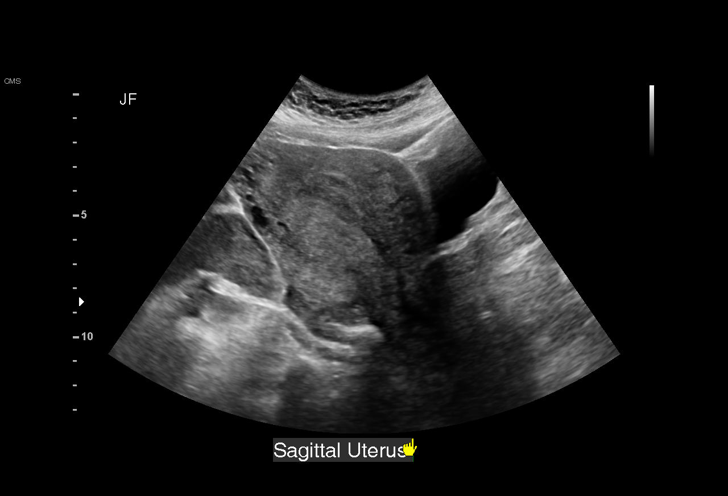
[im 5/61]
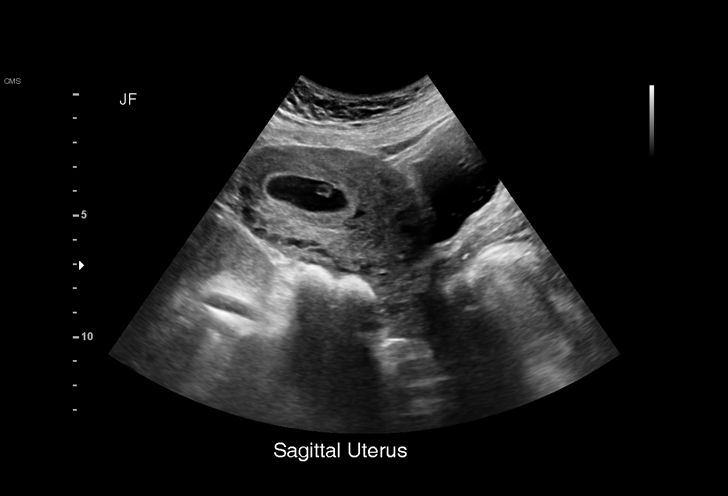
[im 9/61]
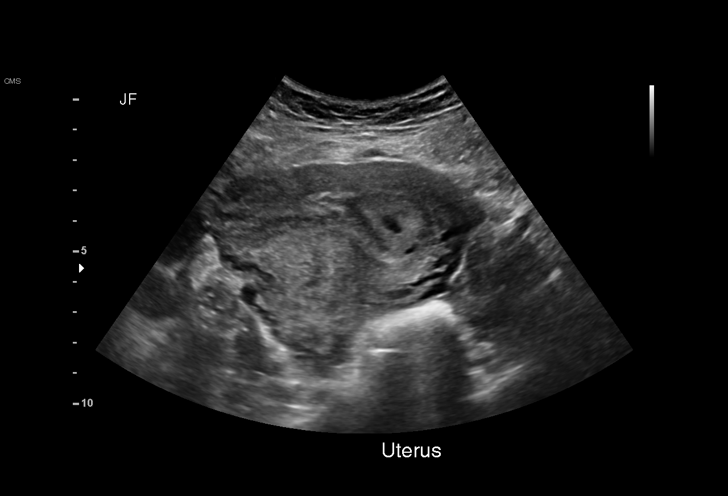
[im 14/61]
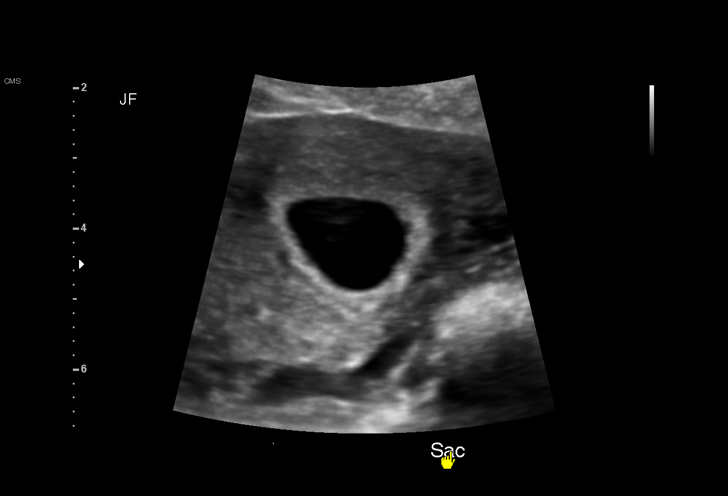
[im 18/61]
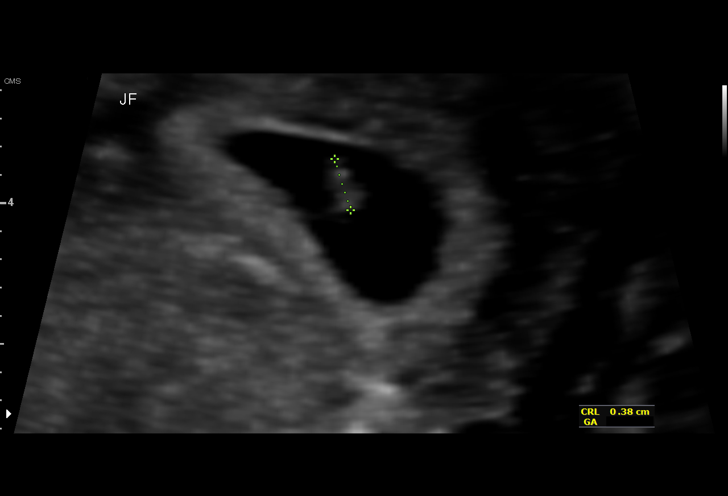
[im 23/61]
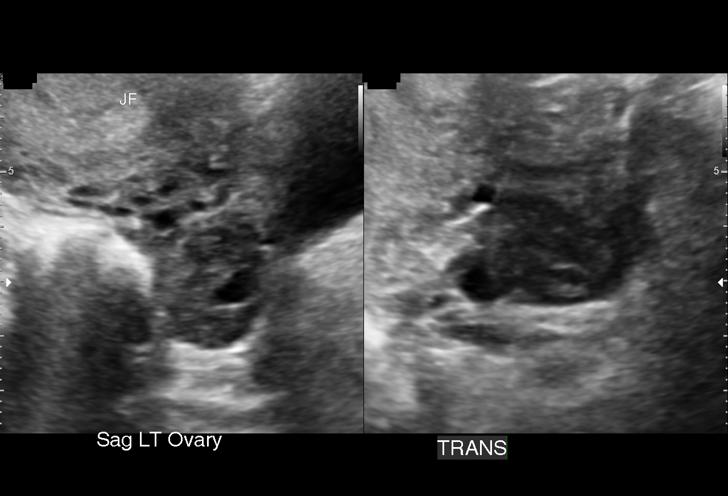
[im 27/61]
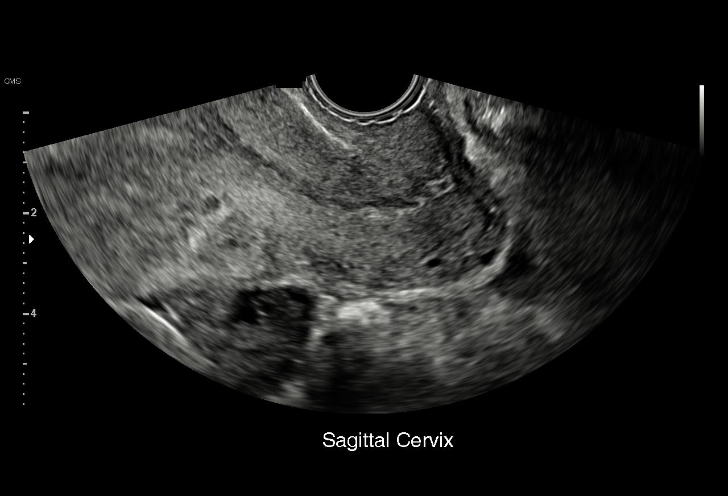
[im 32/61]
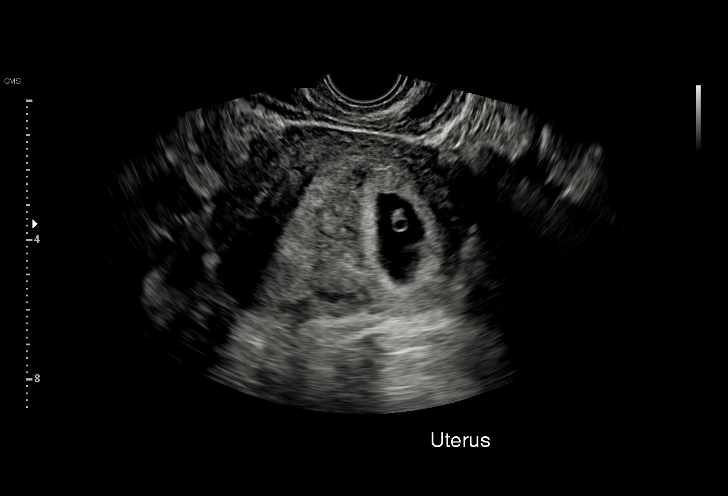
[im 34/61]
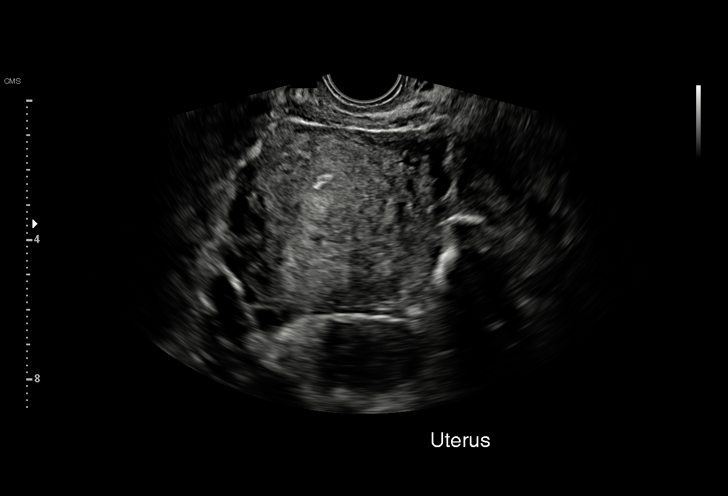
[im 38/61]
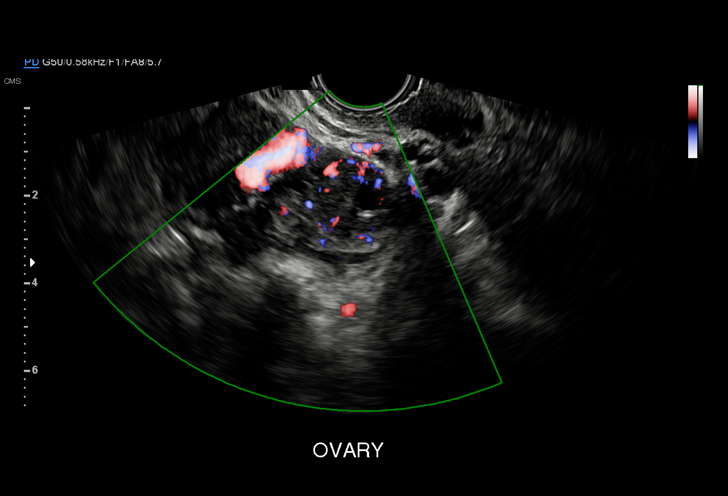
[im 43/61]
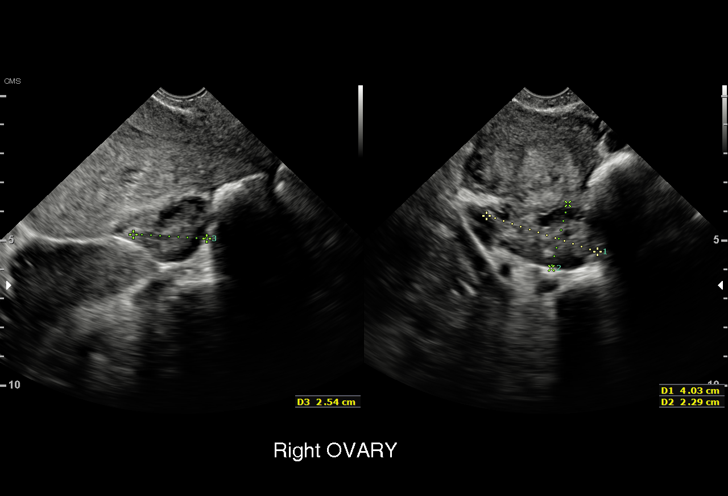
[im 47/61]
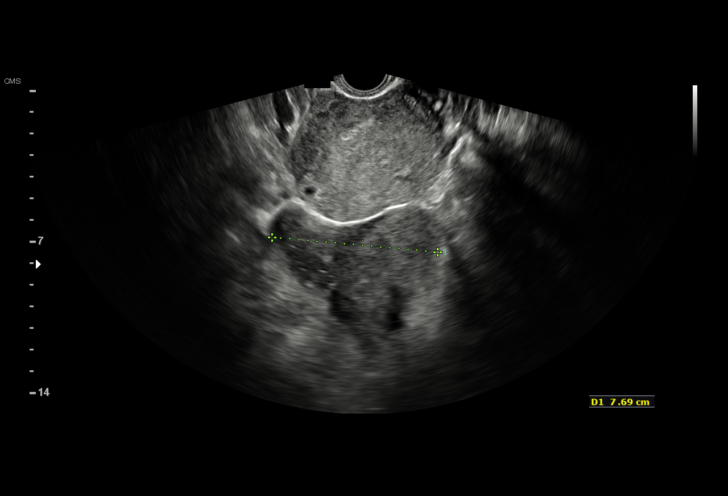
[im 52/61]
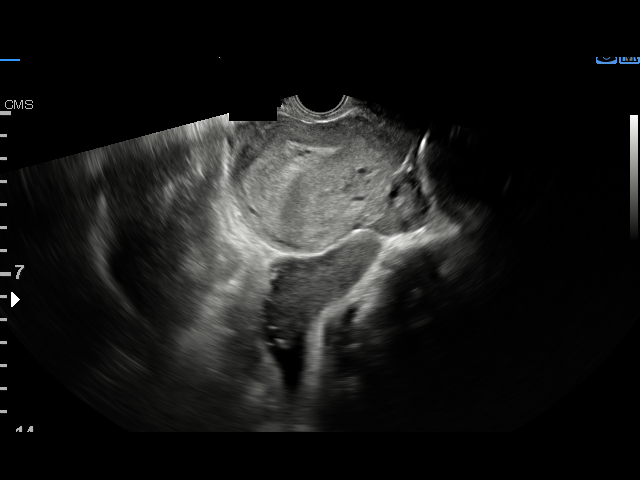
[im 56/61]
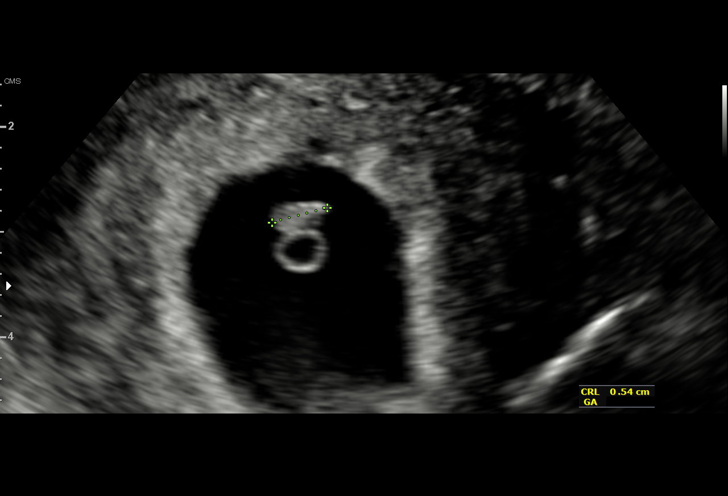
[im 61/61]
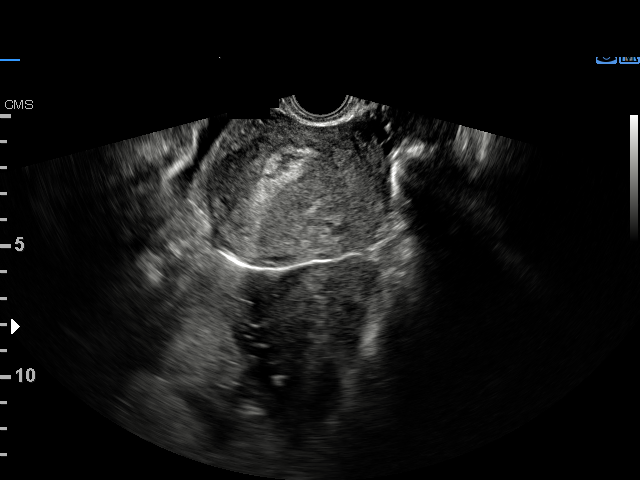

[15 of 28 positions shown; findings below may reference images not displayed]

FINDINGS: Intrauterine gestational sac: Single

Yolk sac:  Visualized

Embryo:  Visualized

Cardiac Activity: Visualized

Heart Rate: 124  bpm

MSD:   mm    w     d

CRL:  5.3 mm  mm   6 w   1 d                  US EDC: 05/21/2015

Subchorionic hemorrhage:  None visualized.

Maternal uterus/adnexae: 7.7 x 8.2 x 3.1 cm hypoechoic area
posterior to the uterus, felt to represent bowel. No free fluid. No
adnexal masses.
IMPRESSION: 6 week 1 day intrauterine pregnancy. Fetal heart rate 124 beats per
minute. No acute maternal findings.

## 2017-09-21 ENCOUNTER — Telehealth: Payer: Self-pay | Admitting: Family Medicine

## 2017-09-21 NOTE — Telephone Encounter (Signed)
Pt was calling to check the status of her results from her MRI. She said she is still having pain and problems with her thumb. Pt would like for for someone to call her with these results.

## 2017-10-05 ENCOUNTER — Other Ambulatory Visit: Payer: Self-pay

## 2017-10-05 ENCOUNTER — Ambulatory Visit (INDEPENDENT_AMBULATORY_CARE_PROVIDER_SITE_OTHER): Payer: Medicaid Other | Admitting: Family Medicine

## 2017-10-05 ENCOUNTER — Encounter: Payer: Self-pay | Admitting: Family Medicine

## 2017-10-05 ENCOUNTER — Other Ambulatory Visit (HOSPITAL_COMMUNITY)
Admission: RE | Admit: 2017-10-05 | Discharge: 2017-10-05 | Disposition: A | Discharge status: Home / Self Care | Payer: Medicaid Other | Source: Ambulatory Visit | Attending: Family Medicine | Admitting: Family Medicine

## 2017-10-05 VITALS — BP 112/74 | HR 94 | Temp 98.3°F | Wt 126.0 lb

## 2017-10-05 DIAGNOSIS — N898 Other specified noninflammatory disorders of vagina: Secondary | ICD-10-CM | POA: Insufficient documentation

## 2017-10-05 LAB — POCT WET PREP (WET MOUNT)
Clue Cells Wet Prep Whiff POC: POSITIVE
Trichomonas Wet Prep HPF POC: ABSENT

## 2017-10-05 LAB — POCT URINALYSIS DIP (MANUAL ENTRY)
Bilirubin, UA: NEGATIVE
Glucose, UA: NEGATIVE mg/dL
Ketones, POC UA: NEGATIVE mg/dL
Leukocytes, UA: NEGATIVE
Nitrite, UA: NEGATIVE
Protein Ur, POC: NEGATIVE mg/dL
Spec Grav, UA: 1.02 (ref 1.010–1.025)
Urobilinogen, UA: 0.2 E.U./dL
pH, UA: 5.5 (ref 5.0–8.0)

## 2017-10-05 LAB — POCT UA - MICROSCOPIC ONLY

## 2017-10-05 MED ORDER — METRONIDAZOLE 0.75 % VA GEL: 1.0000 | Freq: Two times a day (BID) | VAGINAL | 0 refills | Status: DC

## 2017-10-05 NOTE — Progress Notes (Signed)
Subjective:    Patient ID: Rachel Lloyd, female    DOB: 1985/04/28, 32 y.o.   MRN: [id number]   CC: vaginal discharge  Has recurrent BV. Noticed discharge and foul odor. She also has some "pulling" on her back muscles with urination that she says is not normal for her. She does not have suprapubic pain. She denies fevers, chills, nausea, vomiting. She had a reaction to flagyl pills and used metrogel last time with relief. She denies any itching. She denies concern for STD. She does not have a   Smoking status reviewed- former smoker  Review of Systems- see HPI   Objective:  BP 112/74   Pulse 94   Temp 98.3 F (36.8 C) (Oral)   Wt 126 lb (57.2 kg)   SpO2 99%   BMI 23.05 kg/m  Vitals and nursing note reviewed  General: well nourished, in no acute distress HEENT: normocephalic, MMM Cardiac: regular rate Respiratory: no increased work of breathing GU: normal external female genitalia, moderate amount of thick white discharge present, cervix pink without lesion. No CMT tenderness. No adnexal masses appreciated. Extremities: no edema or cyanosis Neuro: alert and oriented, no focal deficits   Assessment & Plan:    Vaginal odor  Patient w/ recurrent BV. UA negative. Wet prep demonstrates BV. Will treat with metrogel. Discussed boric acid suppositories for suppression that patient could try. GC/chlamydia sent to lab. Follow up as needed.   Return if symptoms worsen or fail to improve.   Dolores Patty, DO Family Medicine Resident PGY-3

## 2017-10-05 NOTE — Patient Instructions (Addendum)
You can try boric acid suppositories from [employer] in downtown [place]. I would use this once a week at night as a vaginal suppository for suppression of BV. DO NOT LET CHILDREN INGEST THIS!  If you have questions or concerns please do not hesitate to call at [phone].  Dolores Patty, DO PGY-3, [facility] 10/05/2017 4:38 PM   Bacterial Vaginosis Bacterial vaginosis is a vaginal infection that occurs when the normal balance of bacteria in the vagina is disrupted. It results from an overgrowth of certain bacteria. This is the most common vaginal infection among women ages 29-44. Because bacterial vaginosis increases your risk for STIs (sexually transmitted infections), getting treated can help reduce your risk for chlamydia, gonorrhea, herpes, and HIV (human immunodeficiency virus). Treatment is also important for preventing complications in pregnant women, because this condition can cause an early (premature) delivery. What are the causes? This condition is caused by an increase in harmful bacteria that are normally present in small amounts in the vagina. However, the reason that the condition develops is not fully understood. What increases the risk? The following factors may make you more likely to develop this condition:  Having a new sexual partner or multiple sexual partners.  Having unprotected sex.  Douching.  Having an intrauterine device (IUD).  Smoking.  Drug and alcohol abuse.  Taking certain antibiotic medicines.  Being pregnant.  You cannot get bacterial vaginosis from toilet seats, bedding, swimming pools, or contact with objects around you. What are the signs or symptoms? Symptoms of this condition include:  Grey or white vaginal discharge. The discharge can also be watery or foamy.  A fish-like odor with discharge, especially after sexual intercourse or during menstruation.  Itching in and around the vagina.  Burning or pain  with urination.  Some women with bacterial vaginosis have no signs or symptoms. How is this diagnosed? This condition is diagnosed based on:  Your medical history.  A physical exam of the vagina.  Testing a sample of vaginal fluid under a microscope to look for a large amount of bad bacteria or abnormal cells. Your health care provider may use a cotton swab or a small wooden spatula to collect the sample.  How is this treated? This condition is treated with antibiotics. These may be given as a pill, a vaginal cream, or a medicine that is put into the vagina (suppository). If the condition comes back after treatment, a second round of antibiotics may be needed. Follow these instructions at home: Medicines  Take over-the-counter and prescription medicines only as told by your health care provider.  Take or use your antibiotic as told by your health care provider. Do not stop taking or using the antibiotic even if you start to feel better. General instructions  If you have a female sexual partner, tell her that you have a vaginal infection. She should see her health care provider and be treated if she has symptoms. If you have a female sexual partner, he does not need treatment.  During treatment: ? Avoid sexual activity until you finish treatment. ? Do not douche. ? Avoid alcohol as directed by your health care provider. ? Avoid breastfeeding as directed by your health care provider.  Drink enough water and fluids to keep your urine clear or pale yellow.  Keep the area around your vagina and rectum clean. ? Wash the area daily with warm water. ? Wipe yourself from front to back after using the toilet.  Keep all  follow-up visits as told by your health care provider. This is important. How is this prevented?  Do not douche.  Wash the outside of your vagina with warm water only.  Use protection when having sex. This includes latex condoms and dental dams.  Limit how many sexual  partners you have. To help prevent bacterial vaginosis, it is best to have sex with just one partner (monogamous).  Make sure you and your sexual partner are tested for STIs.  Wear cotton or cotton-lined underwear.  Avoid wearing tight pants and pantyhose, especially during summer.  Limit the amount of alcohol that you drink.  Do not use any products that contain nicotine or tobacco, such as cigarettes and e-cigarettes. If you need help quitting, ask your health care provider.  Do not use illegal drugs. Where to find more information:  Centers for Disease Control and Prevention: SolutionApps.co.zawww.cdc.gov/std  American Sexual Health Association (ASHA): www.ashastd.org  U.S. Department of Health and Health and safety inspectorHuman Services, Office on Women's Health: ConventionalMedicines.siwww.womenshealth.gov/ or http://www.anderson-williamson.info/https://www.womenshealth.gov/a-z-topics/bacterial-vaginosis Contact a health care provider if:  Your symptoms do not improve, even after treatment.  You have more discharge or pain when urinating.  You have a fever.  You have pain in your abdomen.  You have pain during sex.  You have vaginal bleeding between periods. Summary  Bacterial vaginosis is a vaginal infection that occurs when the normal balance of bacteria in the vagina is disrupted.  Because bacterial vaginosis increases your risk for STIs (sexually transmitted infections), getting treated can help reduce your risk for chlamydia, gonorrhea, herpes, and HIV (human immunodeficiency virus). Treatment is also important for preventing complications in pregnant women, because the condition can cause an early (premature) delivery.  This condition is treated with antibiotic medicines. These may be given as a pill, a vaginal cream, or a medicine that is put into the vagina (suppository). This information is not intended to replace advice given to you by your health care provider. Make sure you discuss any questions you have with your health care provider. Document Released:  12/30/2004 Document Revised: 05/05/2016 Document Reviewed: 09/15/2015 Elsevier Interactive Patient Education  [employer]2018 Elsevier Inc.

## 2017-10-05 NOTE — Assessment & Plan Note (Addendum)
  Patient w/ recurrent BV. UA negative. Wet prep demonstrates BV. Will treat with metrogel. Discussed boric acid suppositories for suppression that patient could try. GC/chlamydia sent to lab. Follow up as needed.

## 2017-10-07 ENCOUNTER — Telehealth: Payer: Self-pay

## 2017-10-07 LAB — CERVICOVAGINAL ANCILLARY ONLY
Chlamydia: NEGATIVE
Neisseria Gonorrhea: NEGATIVE

## 2017-10-07 NOTE — Progress Notes (Signed)
Please let patient know STD testing negative.

## 2017-10-07 NOTE — Telephone Encounter (Signed)
-----   Message from Tillman Sers, DO sent at 10/07/2017  4:41 PM EDT ----- Please let patient know STD testing negative.

## 2017-10-07 NOTE — Telephone Encounter (Signed)
Pt informed of negative STD results.

## 2017-11-09 ENCOUNTER — Telehealth: Payer: Self-pay | Admitting: Family Medicine

## 2017-11-09 MED ORDER — FLUCONAZOLE 150 MG PO TABS: 150.0000 mg | ORAL_TABLET | Freq: Once | ORAL | 0 refills | Status: AC

## 2017-11-09 NOTE — Telephone Encounter (Signed)
Pt was taking antibiotics and now has a yeast infection. She would like [place] to call in something for her. jw

## 2017-11-09 NOTE — Telephone Encounter (Signed)
Please let patient know I have sent in diflucan for her  Myrene Buddy MD PGY-2 Family Medicine Resident

## 2017-11-13 NOTE — Telephone Encounter (Signed)
Pt informed. Wilsie Kern Dawn, CMA  

## 2017-11-16 ENCOUNTER — Encounter (HOSPITAL_COMMUNITY): Payer: Self-pay | Admitting: *Deleted

## 2017-11-16 ENCOUNTER — Ambulatory Visit (HOSPITAL_COMMUNITY)
Admission: EM | Admit: 2017-11-16 | Discharge: 2017-11-16 | Disposition: A | Discharge status: Home / Self Care | Payer: Medicaid Other | Attending: Family Medicine | Admitting: Family Medicine

## 2017-11-16 ENCOUNTER — Other Ambulatory Visit: Payer: Self-pay

## 2017-11-16 DIAGNOSIS — N898 Other specified noninflammatory disorders of vagina: Secondary | ICD-10-CM | POA: Diagnosis not present

## 2017-11-16 DIAGNOSIS — Z87891 Personal history of nicotine dependence: Secondary | ICD-10-CM | POA: Insufficient documentation

## 2017-11-16 DIAGNOSIS — Z91040 Latex allergy status: Secondary | ICD-10-CM | POA: Insufficient documentation

## 2017-11-16 DIAGNOSIS — Z3202 Encounter for pregnancy test, result negative: Secondary | ICD-10-CM

## 2017-11-16 DIAGNOSIS — B373 Candidiasis of vulva and vagina: Secondary | ICD-10-CM

## 2017-11-16 DIAGNOSIS — N390 Urinary tract infection, site not specified: Secondary | ICD-10-CM | POA: Diagnosis not present

## 2017-11-16 DIAGNOSIS — Z79899 Other long term (current) drug therapy: Secondary | ICD-10-CM | POA: Diagnosis not present

## 2017-11-16 DIAGNOSIS — B3731 Acute candidiasis of vulva and vagina: Secondary | ICD-10-CM

## 2017-11-16 DIAGNOSIS — F319 Bipolar disorder, unspecified: Secondary | ICD-10-CM | POA: Insufficient documentation

## 2017-11-16 DIAGNOSIS — Z791 Long term (current) use of non-steroidal anti-inflammatories (NSAID): Secondary | ICD-10-CM | POA: Insufficient documentation

## 2017-11-16 DIAGNOSIS — Z880 Allergy status to penicillin: Secondary | ICD-10-CM | POA: Diagnosis not present

## 2017-11-16 DIAGNOSIS — R102 Pelvic and perineal pain: Secondary | ICD-10-CM | POA: Diagnosis present

## 2017-11-16 LAB — POCT PREGNANCY, URINE: Preg Test, Ur: NEGATIVE

## 2017-11-16 MED ORDER — FLUCONAZOLE 150 MG PO TABS: ORAL_TABLET | ORAL | 0 refills | Status: DC

## 2017-11-16 MED ORDER — NITROFURANTOIN MONOHYD MACRO 100 MG PO CAPS: 100.0000 mg | ORAL_CAPSULE | Freq: Two times a day (BID) | ORAL | 0 refills | Status: AC
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
## 2017-11-16 NOTE — ED Triage Notes (Signed)
C/o lower abd. Pain and vaginal pain , states vaginal discharge. No odor

## 2017-11-16 NOTE — Discharge Instructions (Signed)
Your urine is concerning for UTI so we will start antibiotics for this. Complete course of antibiotics.  Please take pill for yeast today, repeat in 3 days as well.  Will notify you of any positive findings from your vaginal swab and urine culture, and if any changes to treatment are needed.   Please withhold from intercourse for the next week. Please use condoms to prevent STD's.   If symptoms worsen or do not improve in the next week to return to be seen or to follow up with your PCP.

## 2017-11-16 NOTE — ED Notes (Signed)
Urine dip stick results exceeded limitations of Clinite[employer] machine.  Provider notified and given results on strip.

## 2017-11-17 LAB — CERVICOVAGINAL ANCILLARY ONLY
Bacterial vaginitis: NEGATIVE
Candida vaginitis: POSITIVE — AB
Chlamydia: NEGATIVE
Neisseria Gonorrhea: NEGATIVE
Trichomonas: NEGATIVE

## 2017-11-18 LAB — URINE CULTURE: Culture: >=100000 — AB

## 2017-11-19 ENCOUNTER — Telehealth: Payer: Self-pay | Admitting: Emergency Medicine

## 2017-11-19 NOTE — Telephone Encounter (Signed)
Called patient, results communicated, patient states the yeast infection is no better and wanted to know if we could send in something else.  Provider at [facility] consulted and provider states patient needs to be reevaluated either here or at clinic of choice if no better.  Patient states she would like to wait and see because she just took the second dose of difulcan.  Will follow up if no better in a day or so.

## 2017-12-07 ENCOUNTER — Emergency Department (HOSPITAL_COMMUNITY)
Admission: EM | Admit: 2017-12-07 | Discharge: 2017-12-07 | Disposition: A | Discharge status: Home / Self Care | Payer: Medicaid Other | Attending: Emergency Medicine | Admitting: Emergency Medicine

## 2017-12-07 ENCOUNTER — Other Ambulatory Visit: Payer: Self-pay

## 2017-12-07 DIAGNOSIS — F329 Major depressive disorder, single episode, unspecified: Secondary | ICD-10-CM | POA: Diagnosis not present

## 2017-12-07 DIAGNOSIS — Z79899 Other long term (current) drug therapy: Secondary | ICD-10-CM | POA: Insufficient documentation

## 2017-12-07 DIAGNOSIS — F1410 Cocaine abuse, uncomplicated: Secondary | ICD-10-CM | POA: Diagnosis not present

## 2017-12-07 DIAGNOSIS — F410 Panic disorder [episodic paroxysmal anxiety] without agoraphobia: Secondary | ICD-10-CM | POA: Diagnosis not present

## 2017-12-07 DIAGNOSIS — F332 Major depressive disorder, recurrent severe without psychotic features: Secondary | ICD-10-CM | POA: Insufficient documentation

## 2017-12-07 DIAGNOSIS — F1210 Cannabis abuse, uncomplicated: Secondary | ICD-10-CM | POA: Diagnosis not present

## 2017-12-07 DIAGNOSIS — Z046 Encounter for general psychiatric examination, requested by authority: Secondary | ICD-10-CM | POA: Diagnosis present

## 2017-12-07 DIAGNOSIS — F1020 Alcohol dependence, uncomplicated: Secondary | ICD-10-CM | POA: Diagnosis not present

## 2017-12-07 DIAGNOSIS — F32A Depression, unspecified: Secondary | ICD-10-CM

## 2017-12-07 DIAGNOSIS — Z87891 Personal history of nicotine dependence: Secondary | ICD-10-CM | POA: Diagnosis not present

## 2017-12-07 DIAGNOSIS — Z91040 Latex allergy status: Secondary | ICD-10-CM | POA: Diagnosis not present

## 2017-12-07 LAB — RAPID URINE DRUG SCREEN, HOSP PERFORMED
Amphetamines: NOT DETECTED
Barbiturates: NOT DETECTED
Benzodiazepines: NOT DETECTED
Cocaine: NOT DETECTED
Opiates: NOT DETECTED
Tetrahydrocannabinol: POSITIVE — AB

## 2017-12-07 LAB — COMPREHENSIVE METABOLIC PANEL
ALT: 19 U/L (ref 0–44)
AST: 21 U/L (ref 15–41)
Albumin: 4.4 g/dL (ref 3.5–5.0)
Alkaline Phosphatase: 61 U/L (ref 38–126)
Anion gap: 7 (ref 5–15)
BUN: 14 mg/dL (ref 6–20)
CO2: 26 mmol/L (ref 22–32)
Calcium: 9.3 mg/dL (ref 8.9–10.3)
Chloride: 107 mmol/L (ref 98–111)
Creatinine, Ser: 0.72 mg/dL (ref 0.44–1.00)
GFR calc Af Amer: >60 mL/min (ref >60)
GFR calc non Af Amer: >60 mL/min (ref >60)
Glucose, Bld: 91 mg/dL (ref 70–99)
Potassium: 3.7 mmol/L (ref 3.5–5.1)
Sodium: 140 mmol/L (ref 135–145)
Total Bilirubin: 0.7 mg/dL (ref 0.3–1.2)
Total Protein: 7.6 g/dL (ref 6.5–8.1)

## 2017-12-07 LAB — CBC WITH DIFFERENTIAL/PLATELET
Abs Immature Granulocytes: 0.01 10*3/uL (ref 0.00–0.07)
Basophils Absolute: 0 10*3/uL (ref 0.0–0.1)
Basophils Relative: 1 %
Eosinophils Absolute: 0.1 10*3/uL (ref 0.0–0.5)
Eosinophils Relative: 1 %
HCT: 40.5 % (ref 36.0–46.0)
Hemoglobin: 13.3 g/dL (ref 12.0–15.0)
Immature Granulocytes: 0 %
Lymphocytes Relative: 26 %
Lymphs Abs: 1.7 10*3/uL (ref 0.7–4.0)
MCH: 30.8 pg (ref 26.0–34.0)
MCHC: 32.8 g/dL (ref 30.0–36.0)
MCV: 93.8 fL (ref 80.0–100.0)
Monocytes Absolute: 0.3 10*3/uL (ref 0.1–1.0)
Monocytes Relative: 5 %
Neutro Abs: 4.5 10*3/uL (ref 1.7–7.7)
Neutrophils Relative %: 67 %
Platelets: 233 10*3/uL (ref 150–400)
RBC: 4.32 MIL/uL (ref 3.87–5.11)
RDW: 12.2 % (ref 11.5–15.5)
WBC: 6.6 10*3/uL (ref 4.0–10.5)
nRBC: 0 % (ref 0.0–0.2)

## 2017-12-07 LAB — TSH: TSH: 0.829 u[IU]/mL (ref 0.350–4.500)

## 2017-12-07 LAB — ACETAMINOPHEN LEVEL: Acetaminophen (Tylenol), Serum: <10 ug/mL — ABNORMAL LOW (ref 10–30)

## 2017-12-07 LAB — ETHANOL: Alcohol, Ethyl (B): <10 mg/dL (ref <10)

## 2017-12-07 LAB — SALICYLATE LEVEL: Salicylate Lvl: <7 mg/dL (ref 2.8–30.0)

## 2017-12-07 NOTE — ED Notes (Signed)
Pt d/c home per MD order. Discharge summary reviewed with pt. Pt verbalizes understanding. Pt denies SI/HI/AVH. Pt signed e-signature. Personal property returned. Pt ambulatory off unit with MHT.  

## 2017-12-07 NOTE — Discharge Instructions (Signed)
For your behavioral health needs you are advised to follow up with [facility].  New patients are seen at their walk-in clinic.  Walk-in hours are Monday - Friday from 8:00 am - 12:00 pm, and from 1:00 pm - 3:00 pm.  Walk-in patients are seen on a first come, first served basis, so try to arrive as early as possible for the best chance of being seen the same day.  There is an initial fee of $22.50:       [facility]      [address]      [place], [place] [address]      [phone]  If you are interested in a structured program, contact [facility].  They offer Intensive Outpatient and Partial Hospitalization programs, both of which meet several hours a day, several times a week:       [facility]      [phone]      [place], [place] [address]      [phone]
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
## 2017-12-07 NOTE — ED Notes (Signed)
Pt was brought in by her therapist for increased depression. Pt's therapist took pt's pocketbook with her per pt's request. Pt's therapists number is [phone]. Therapist is Jackqulyn LivingsEdna Hurley LPC.
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
## 2017-12-07 NOTE — ED Notes (Signed)
Pt talking on hallway phone.  

## 2017-12-07 NOTE — ED Notes (Signed)
Provider at bedside

## 2017-12-07 NOTE — ED Triage Notes (Signed)
Pt to room #40. Pt reports she came to the hospital from direction of her therapist d/t therapist feels pt is not improving. Pt pleasant on approach pt reports increase stress, feeling overwhelmed, and racing thought. Identifies current stressors of being a single parent and working 2 jobs. Currently denies SI/HI/AVH. Encouragement and support provided. Special checks q 15 mins in place for safety, video monitoring in place. Will continue to monitor.

## 2017-12-07 NOTE — ED Notes (Signed)
Pt has been wanded by security and changed into scrubs prior to Acute area.

## 2017-12-07 NOTE — ED Notes (Signed)
Informed edp that pt is on unit.

## 2017-12-07 NOTE — ED Notes (Signed)
Spoke with lab. TSH added to previous collected lab.

## 2017-12-07 NOTE — BH Assessment (Signed)
[facility] Assessment Progress Note  Per Nanine MeansJamison Lord, DNP, this pt does not require psychiatric hospitalization at this time.  [facility] information for [facility] services.  Discharge instructions include contact information for [facility] for [facility] services, and for [facility] for Intensive Outpatient and Partial Hospitalization programming.  Pt's nurse, Morrie Sheldonshley, has been notified.  Doylene Canninghomas Frimy Uffelman, MA Triage Specialist [phone]

## 2017-12-16 ENCOUNTER — Telehealth: Payer: Self-pay | Admitting: Family Medicine

## 2017-12-16 NOTE — Telephone Encounter (Signed)
Patient dropped off [place] Access Referral Form to be completed.  Requesting form to be faxed to [phone].   Form placed in folder

## 2017-12-16 NOTE — Telephone Encounter (Signed)
Reviewed [place] [facility] [place] ACCESS referral form and placed in PCP's box for completion.  Glennie Hawk.Fujie Dickison R, CMA

## 2017-12-23 ENCOUNTER — Ambulatory Visit (INDEPENDENT_AMBULATORY_CARE_PROVIDER_SITE_OTHER): Payer: Medicaid Other | Admitting: *Deleted

## 2017-12-23 DIAGNOSIS — Z3202 Encounter for pregnancy test, result negative: Secondary | ICD-10-CM | POA: Diagnosis not present

## 2017-12-23 DIAGNOSIS — Z3042 Encounter for surveillance of injectable contraceptive: Secondary | ICD-10-CM | POA: Diagnosis not present

## 2017-12-23 DIAGNOSIS — Z309 Encounter for contraceptive management, unspecified: Secondary | ICD-10-CM

## 2017-12-23 LAB — POCT URINE PREGNANCY: Preg Test, Ur: NEGATIVE

## 2017-12-23 MED ORDER — MEDROXYPROGESTERONE ACETATE 150 MG/ML IM SUSY
150.0000 mg | PREFILLED_SYRINGE | Freq: Once | INTRAMUSCULAR | Status: AC
  Administered 2017-12-23: 150 mg via INTRAMUSCULAR
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
## 2017-12-23 NOTE — Telephone Encounter (Signed)
Paperwork completed and placed in nurses box  Rachel BuddyJacob Malijah Lietz MD PGY-2 Family Medicine Resident

## 2017-12-23 NOTE — Telephone Encounter (Signed)
Attempted to call patient concerning paperwork for [facility].   There was no answer and the voicemail is full.  Paperwork has been completed and faxed to [phone] at patient's request.  In addition a copy has been left up front for patient to pick up.  Glennie Hawk.Jolynda Townley R, CMA

## 2017-12-23 NOTE — Telephone Encounter (Signed)
Patient requesting update on paperwork

## 2017-12-31 IMAGING — US US MFM OB FOLLOW-UP
1 series · 12 of 28 positions shown · non-contrast
Comparison: none

[Series 1: us mfm ob follow-up · 12 of 40 slices shown]
[im 2/40]
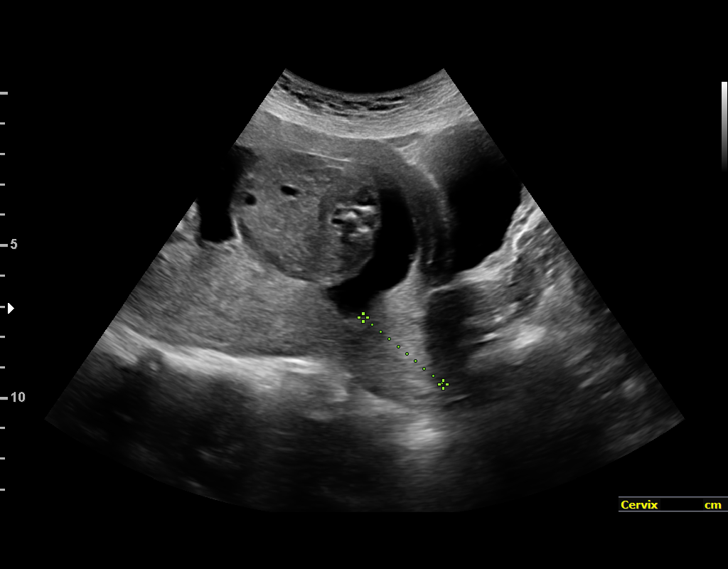
[im 5/40]
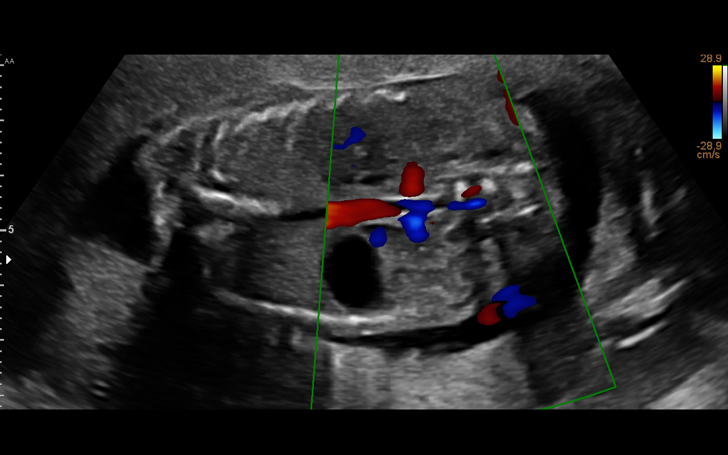
[im 8/40]
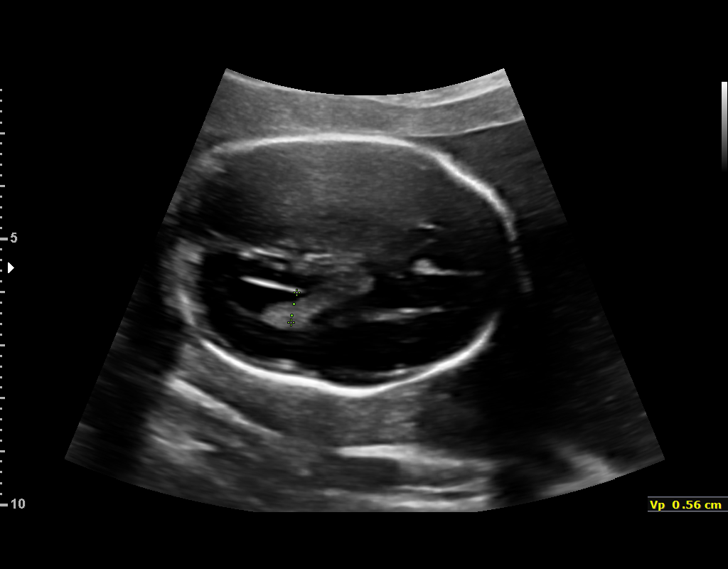
[im 12/40]
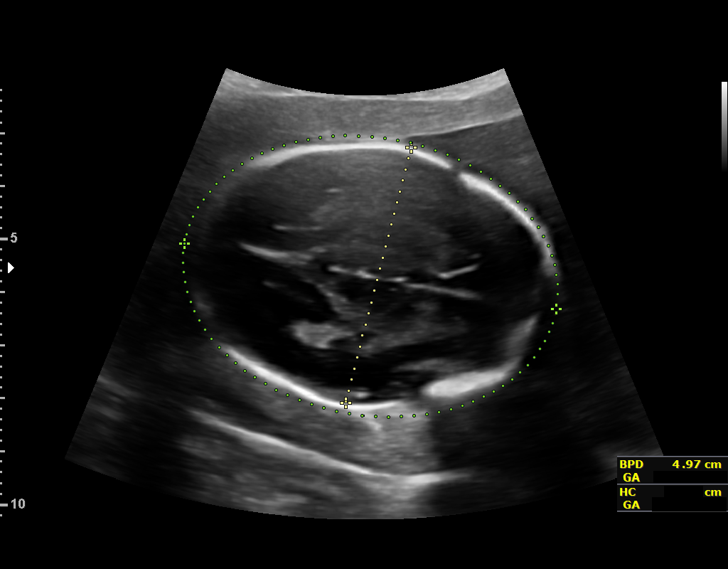
[im 15/40]
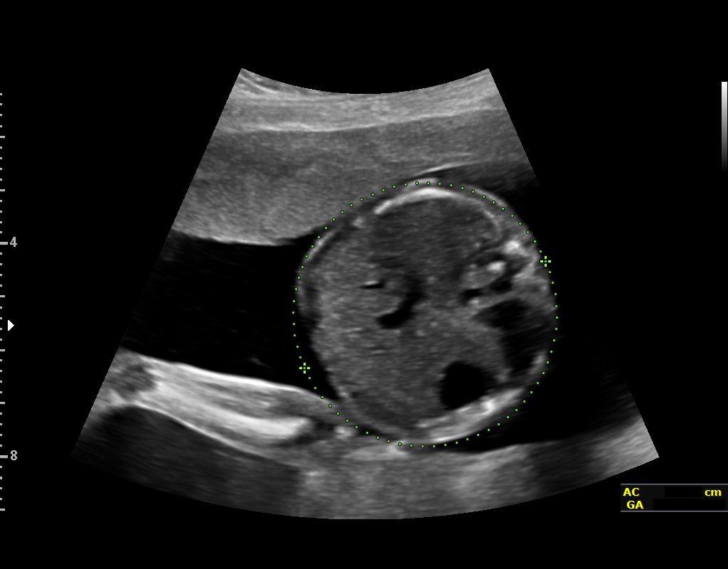
[im 18/40]
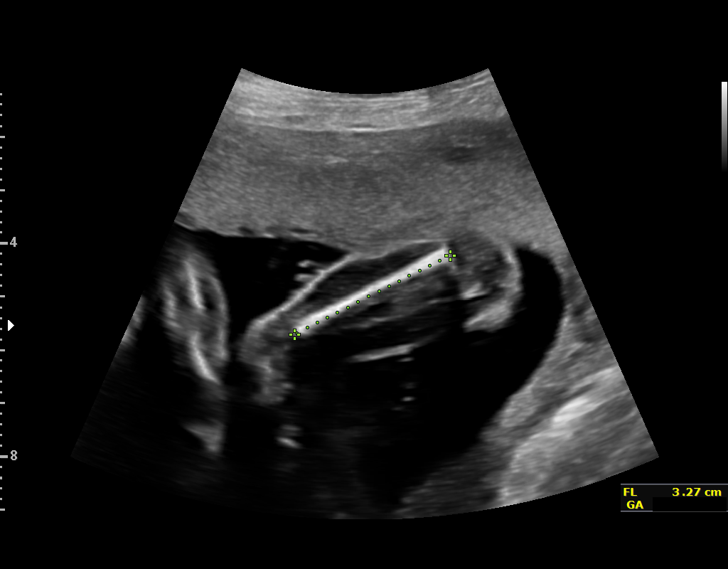
[im 22/40]
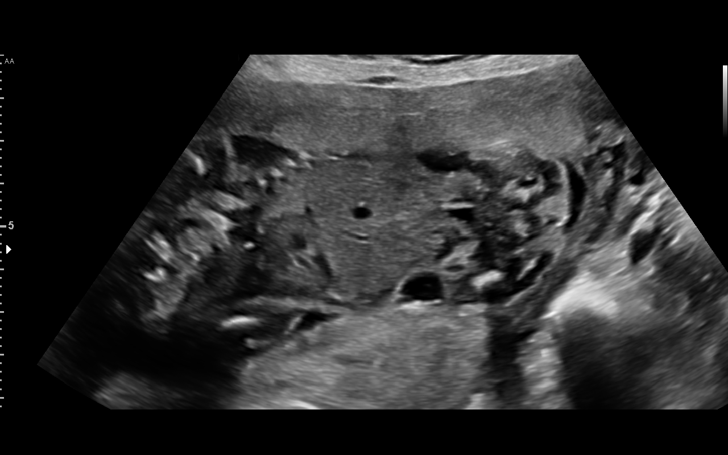
[im 25/40]
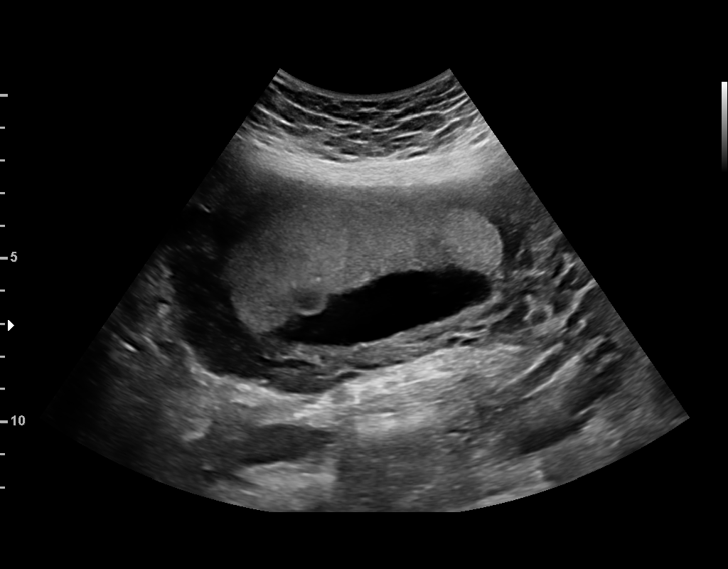
[im 28/40]
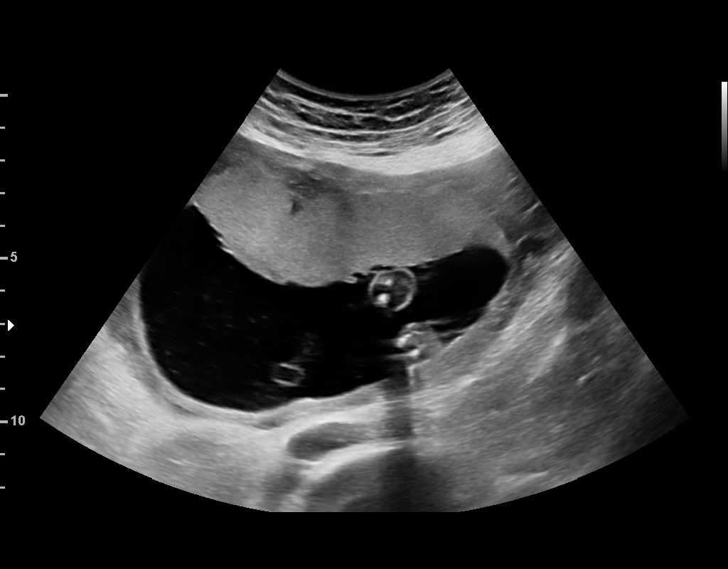
[im 32/40]
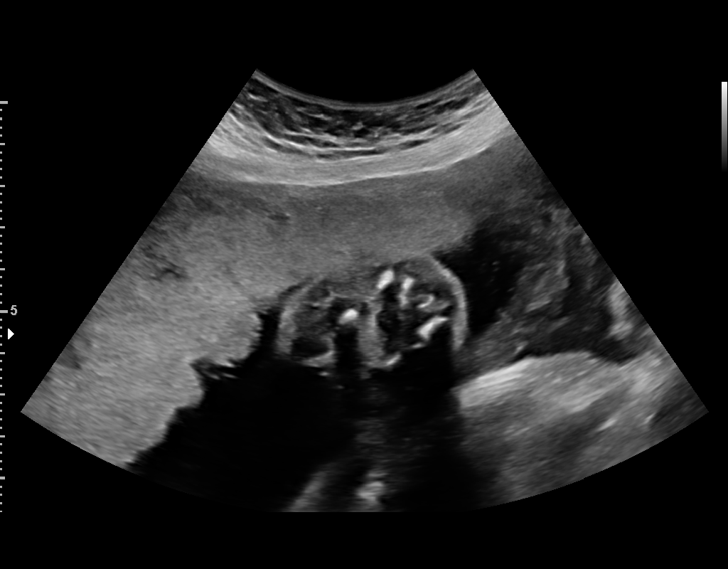
[im 35/40]
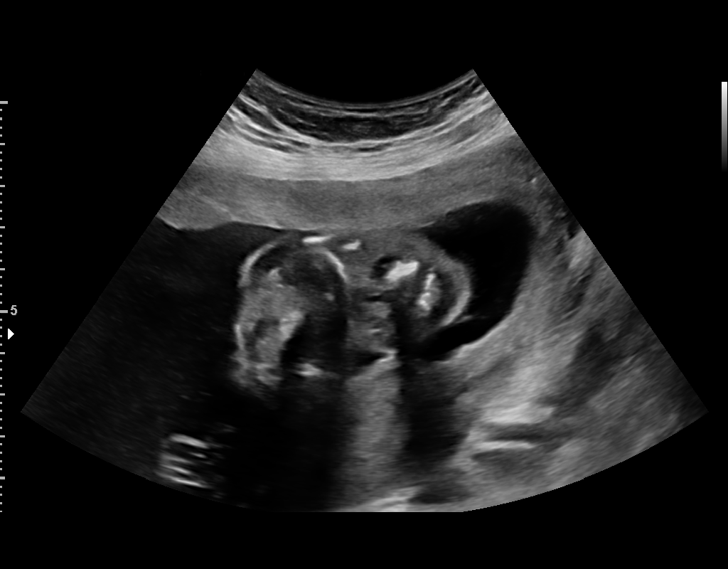
[im 38/40]
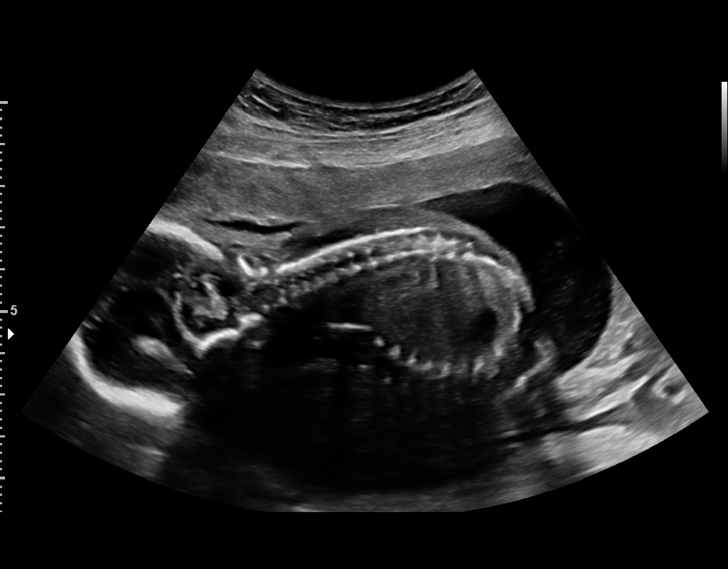

[12 of 28 positions shown; findings below may reference images not displayed]

1  HATOUN RENU              [id number]      [id number]     [id number]
Indications

23 weeks gestation of pregnancy
Maternal care for known or suspected poor
fetal growth, second trimester, not applicable
or unspecified
Drug use complicating pregnancy, second
trimester; cocaine in first trimester
OB History

Gravidity:    8         Term:   3        Prem:   0        SAB:   1
TOP:          3       Ectopic:  0        Living: 3
Fetal Evaluation

Num Of Fetuses:     1
Fetal Heart         146
Rate(bpm):
Cardiac Activity:   Observed
Presentation:       Cephalic
Placenta:           Anterior, above cervical os
P. Cord Insertion:  Visualized, central

Amniotic Fluid
AFI FV:      Subjectively within normal limits

Largest Pocket(cm)
3.5.
Biometry

BPD:      47.6  mm     G. Age:  20w 3d          0  %    CI:        64.33   %   70 - 86
FL/HC:      17.1   %   19.2 -
HC:      190.9  mm     G. Age:  21w 3d        < 3  %    HC/AC:      1.24       1.05 -
AC:      153.8  mm     G. Age:  20w 4d        < 3  %    FL/BPD:     68.5   %   71 - 87
FL:       32.6  mm     G. Age:  20w 1d        < 3  %    FL/AC:      21.2   %   20 - 24
CER:      22.1  mm     G. Age:  21w 0d        < 5  %
CM:        4.5  mm

Est. FW:     357  gm    0 lb 13 oz    < 10  %
Gestational Age

LMP:           23w 1d       Date:   08/09/15                 EDD:   05/15/16
U/S Today:     20w 5d                                        EDD:   06/01/16
Best:          23w 1d    Det. By:   LMP  (08/09/15)          EDD:   05/15/16
Anatomy

Cranium:               Appears normal         Aortic Arch:            Previously seen
Cavum:                 Appears normal         Ductal Arch:            Previously seen
Ventricles:            Appears normal         Diaphragm:              Appears normal
Choroid Plexus:        Appears normal         Stomach:                Appears normal, left
sided
Cerebellum:            Appears normal         Abdomen:                Appears normal
Posterior Fossa:       Previously seen        Abdominal Wall:         Previously seen
Nuchal Fold:           Not applicable (>20    Cord Vessels:           Previously seen
wks GA)
Face:                  Orbits and profile     Kidneys:                Appear normal
previously seen
Lips:                  Previously seen        Bladder:                Appears normal
Thoracic:              Previously seen        Spine:                  Appears normal
Heart:                 Previously seen        Upper Extremities:      Previously seen
RVOT:                  Previously seen        Lower Extremities:      Previously seen
LVOT:                  Previously seen

Other:  Fetus appears to be a female. Heels and 5th digit previously
visualized. Nasal bone visualized previously.
Cervix Uterus Adnexa

Cervix
Length:           3.42  cm.
Normal appearance by transabdominal scan.

Left Ovary
Within normal limits.

Right Ovary
Within normal limits.
Impression

SIUP at 23+1 weeks
Normal interval anatomy; anatomic survey complete
Normal amniotic fluid volume
Appropriate interval growth: fetus grew 2+1 weeks over past
2+2 weeks; still lagging by 2-3 weeks

After genetic counseling, Ms. Allahvedi declined all further
testing (cell free DNA screening and TORCH titers).

(10 weeks US performed at [facility])
Recommendations

Follow-up ultrasound for growth in 2 weeks
Please see genetic counseling note

## 2018-01-27 ENCOUNTER — Telehealth: Payer: Self-pay | Admitting: *Deleted

## 2018-01-27 NOTE — Telephone Encounter (Signed)
Pt called and lm on RN line stating that she has been bleeding since receiving her depo.   Reviewed depo appt and pt was sexually active but per protocol it was ok to give depo. (see note from 12/23/17)  Attempted to call pt x3 to schedule an appt no answer and no vm set up.  Fleeger, Maryjo Rochester, CMA

## 2018-02-03 ENCOUNTER — Other Ambulatory Visit: Payer: Self-pay

## 2018-02-03 ENCOUNTER — Ambulatory Visit (INDEPENDENT_AMBULATORY_CARE_PROVIDER_SITE_OTHER): Payer: Medicaid Other | Admitting: Family Medicine

## 2018-02-03 VITALS — BP 124/68 | Temp 98.4°F | Wt 131.0 lb

## 2018-02-03 DIAGNOSIS — N926 Irregular menstruation, unspecified: Secondary | ICD-10-CM

## 2018-02-03 DIAGNOSIS — N898 Other specified noninflammatory disorders of vagina: Secondary | ICD-10-CM

## 2018-02-03 DIAGNOSIS — R350 Frequency of micturition: Secondary | ICD-10-CM | POA: Diagnosis not present

## 2018-02-03 LAB — POCT URINALYSIS DIP (MANUAL ENTRY)
Bilirubin, UA: NEGATIVE
Glucose, UA: NEGATIVE mg/dL
Ketones, POC UA: NEGATIVE mg/dL
Leukocytes, UA: NEGATIVE
Nitrite, UA: POSITIVE — AB
Protein Ur, POC: NEGATIVE mg/dL
Spec Grav, UA: 1.025 (ref 1.010–1.025)
Urobilinogen, UA: 0.2 E.U./dL
pH, UA: 6 (ref 5.0–8.0)

## 2018-02-03 LAB — POCT UA - MICROSCOPIC ONLY

## 2018-02-03 LAB — POCT URINE PREGNANCY: Preg Test, Ur: NEGATIVE

## 2018-02-03 MED ORDER — METRONIDAZOLE 0.75 % VA GEL: 1.0000 | VAGINAL | 2 refills | Status: DC

## 2018-02-03 MED ORDER — SULFAMETHOXAZOLE-TRIMETHOPRIM 800-160 MG PO TABS: 1.0000 | ORAL_TABLET | Freq: Two times a day (BID) | ORAL | 0 refills | Status: DC
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
## 2018-02-03 NOTE — Assessment & Plan Note (Signed)
Urine pregnancy test is negative.  Since patient is not amenable to OCPs or a progestin IUD, she may perhaps benefit from Nexplanon to help with her bleeding.  Although Nexplanon is not as effective as the other 2 options for controlling menstrual bleeding, it would at least give her very reliable birth control for the next 3 years, she is amenable to this idea, so we discussed that she needs to make an appointment in about 2 months, when her Depo shot is almost out, to have Nexplanon inserted.

## 2018-02-03 NOTE — Assessment & Plan Note (Signed)
This seems to be a recurrent issue for patient.  Refilled patient's MetroGel and encouraged patient to return if this does not help her symptoms, and we can do a pelvic exam at that time.

## 2018-02-03 NOTE — Assessment & Plan Note (Signed)
Patient's urinary symptoms and UA with positive nitrites and hemoglobin are consistent with a current UTI.  Since patient has anaphylaxis to penicillin and continues to get UTIs despite Macrobid, will try Bactrim 800-160 milligrams twice daily for 3 days for this infection.

## 2018-04-06 ENCOUNTER — Telehealth: Payer: Self-pay | Admitting: Family Medicine

## 2018-04-06 NOTE — Telephone Encounter (Signed)
Patient calling in with complaint of vaginal discharge since the past 3-4 days.  Describes it as white and chunky, has noted a fishy odor to it.  No abnormal bleeding but she does have lots of itching. The area [place] red and irritated.  She is sexually active with her partner however would like to come in for testing.  She is not concerned about STD as she has not had any new sexual partners recently.  She endorses dysuria and frequency along with suprapubic pain.  History concerning for UTI however also having symptoms of yeast/BV would likely need testing prior to treatment. Has not tried anything OTC yet.   No fever, chills, nausea, vomiting.  Advised patient she will likely need urine test and wet prep.   Patient scheduled in clinic for 3/25, will forward to provider who will be seeing her.   Freddrick March MD

## 2018-04-07 ENCOUNTER — Other Ambulatory Visit: Payer: Self-pay

## 2018-04-07 ENCOUNTER — Other Ambulatory Visit (HOSPITAL_COMMUNITY)
Admission: RE | Admit: 2018-04-07 | Discharge: 2018-04-07 | Disposition: A | Discharge status: Home / Self Care | Payer: Medicaid Other | Source: Ambulatory Visit | Attending: Family Medicine | Admitting: Family Medicine

## 2018-04-07 ENCOUNTER — Encounter: Payer: Self-pay | Admitting: Family Medicine

## 2018-04-07 ENCOUNTER — Ambulatory Visit (INDEPENDENT_AMBULATORY_CARE_PROVIDER_SITE_OTHER): Payer: Medicaid Other | Admitting: Family Medicine

## 2018-04-07 VITALS — BP 102/62 | HR 90 | Wt 134.2 lb

## 2018-04-07 DIAGNOSIS — N898 Other specified noninflammatory disorders of vagina: Secondary | ICD-10-CM

## 2018-04-07 DIAGNOSIS — N3001 Acute cystitis with hematuria: Secondary | ICD-10-CM | POA: Insufficient documentation

## 2018-04-07 DIAGNOSIS — B9689 Other specified bacterial agents as the cause of diseases classified elsewhere: Secondary | ICD-10-CM | POA: Insufficient documentation

## 2018-04-07 DIAGNOSIS — N760 Acute vaginitis: Secondary | ICD-10-CM

## 2018-04-07 LAB — POCT URINALYSIS DIP (MANUAL ENTRY)
Bilirubin, UA: NEGATIVE
Glucose, UA: NEGATIVE mg/dL
Ketones, POC UA: NEGATIVE mg/dL
Nitrite, UA: POSITIVE — AB
Protein Ur, POC: NEGATIVE mg/dL
Spec Grav, UA: 1.02 (ref 1.010–1.025)
Urobilinogen, UA: 0.2 E.U./dL
pH, UA: 6 (ref 5.0–8.0)

## 2018-04-07 LAB — POCT WET PREP (WET MOUNT)
Clue Cells Wet Prep Whiff POC: POSITIVE
Trichomonas Wet Prep HPF POC: ABSENT

## 2018-04-07 LAB — POCT URINE PREGNANCY: Preg Test, Ur: NEGATIVE

## 2018-04-07 LAB — POCT UA - MICROSCOPIC ONLY: Epithelial cells, urine per micros: >20

## 2018-04-07 MED ORDER — METRONIDAZOLE 500 MG PO TABS: 500.0000 mg | ORAL_TABLET | Freq: Two times a day (BID) | ORAL | 0 refills | Status: DC

## 2018-04-07 MED ORDER — CLINDAMYCIN PHOSPHATE 1 % EX GEL: Freq: Two times a day (BID) | CUTANEOUS | 0 refills | Status: DC

## 2018-04-07 MED ORDER — CIPROFLOXACIN HCL 250 MG PO TABS: 250.0000 mg | ORAL_TABLET | Freq: Two times a day (BID) | ORAL | 0 refills | Status: DC

## 2018-04-07 NOTE — Assessment & Plan Note (Addendum)
Patient with white watery discharge on exam.  Moderate clue cells with positive whiff test on wet prep.  Patient also has 1-5 white blood cells.  Failed use of MetroGel at home, therefore would not recommend repeating at this time.  Patient states that she has taken Flagyl in the past and has not tolerated this well.  She would prefer another topical treatment.  Negative urine pregnancy test. - Clindamycin gel 1% twice daily x3 days -Gonorrhea and chlamydia culture pending -Probiotic

## 2018-04-07 NOTE — Patient Instructions (Addendum)
Thank you for coming to see me today. It was a pleasure. Today we talked about:   Your UTI: Take the medication prescribed twice a day for the next three days.  Find Probiotics and Cranberry Pills at the pharmacy.  Take those daily to decrease your chance of getting more UTIs.  Eat yogurt while you are on your antibiotic.  Your Discharge: You have bacterial vaginosis.  Use the cream twice a day for three days.  I will call if your other test results are positive   If you have any questions or concerns, please do not hesitate to call the office at [phone].  Best,   Luis Abed, DO

## 2018-04-07 NOTE — Assessment & Plan Note (Addendum)
UA positive for nitrites, small leukocytes, small blood.  Patient has a history of UTI with resistance to all oral medications aside from Cipro and nitrofurantoin on most recent culture November 2019.  She was most recently treated for UTI with Bactrim in January, but states that she does not think it was never fully treated.  She does not have any evidence of pyelonephritis on exam.  We will treat with Cipro due to resistant cultures in the past. -Cipro 250 mg twice daily x3 days -Urine culture -Advised patient to start using cranberry pills and probiotic daily -Return precautions given, patient voiced understanding

## 2018-04-07 NOTE — Progress Notes (Signed)
Subjective: Chief Complaint  Patient presents with  . Possible UTI     HPI: Rachel Lloyd is a 33 y.o. presenting to clinic today to discuss the following:  1 Vaginal Discharge  Patient reports that discharge started 5 days ago.  She notes that discharge appears white and somewhat watery and states "when I'm walking at work, it feels like I'm peeing on myself."  She has fishy vaginal odor.  She endorses vaginal itching.  Also notes some mild cramping in her stomach.  No dysuria.  Endorses increased urgency of urinating.  Takes Depo shot regularly but states that she has had two children while using Depo in the past.  Patient also notes that she has some cramping with sex, but does not endorse pain with intercourse.  She states that she is currently sexually active and has unprotected sex with one partner.  She states that her partner was in jail and after returning from jail she has started to have this problem.  She denies douching.  Patient has a prescription for MetroGel at home which she tried to use without relief.  Patient has a history of multiple UTIs in the past.  Urine cultures have shown resistance to most oral medications aside from ciprofloxacin, nitrofurantoin.  She has taken Macrobid many times for UTIs.  She states that her most recent UTI was in January.  She states that she did have some improvement from this after taking her medications, but states "I do not think it never really went away."  She used Bactrim at that time.  She endorses some mild left-sided flank pain, although states that she has chronic back pain from work.  She denies any recent fevers, chills, vomiting.  She states that she has had some nausea but is getting this when she smokes weed.     ROS noted in HPI.   Past Medical, Surgical, Social, and Family History Reviewed & Updated per EMR.   Pertinent Historical Findings include:   Social History   Tobacco Use  Smoking Status Former Smoker  .  Types: Cigarettes  . Last attempt to quit: 04/04/2010  . Years since quitting: 8.0  Smokeless Tobacco Never Used      Objective: BP 102/62   Pulse 90   Wt 134 lb 4 oz (60.9 kg)   SpO2 99%   BMI 24.55 kg/m  Vitals and nursing notes reviewed  Physical Exam: General: 33 y.o. female in NAD Lungs: Breathing comfortably on room air Abdomen: Soft, non-tender to palpation, positive bowel sounds, no CVA tenderness Skin: warm and dry Pelvic exam performed with patient supine.  Chaperone in room.  Bilateral labia without abnormalities, no inguinal LAD palpated.  Cervix exhibits thin, watery, white discharge, no cervix abnormalities.  No vaginal lesions.  Vaginal discharge is thin, watery, and white.    Results for orders placed or performed in visit on 04/07/18 (from the past 72 hour(s))  POCT urinalysis dipstick     Status: Abnormal   Collection Time: 04/07/18  3:45 PM  Result Value Ref Range   Color, UA yellow yellow   Clarity, UA cloudy (A) clear   Glucose, UA negative negative mg/dL   Bilirubin, UA negative negative   Ketones, POC UA negative negative mg/dL   Spec Grav, UA 1.610 9.604 - 1.025   Blood, UA small (A) negative   pH, UA 6.0 5.0 - 8.0   Protein Ur, POC negative negative mg/dL   Urobilinogen, UA 0.2 0.2 or  1.0 E.U./dL   Nitrite, UA Positive (A) Negative   Leukocytes, UA Small (1+) (A) Negative  POCT UA - Microscopic Only     Status: Abnormal   Collection Time: 04/07/18  3:45 PM  Result Value Ref Range   WBC, Ur, HPF, POC 0-3    RBC, urine, microscopic 0-3    Bacteria, U Microscopic 3+    Epithelial cells, urine per micros >20   POCT urine pregnancy     Status: None   Collection Time: 04/07/18  3:45 PM  Result Value Ref Range   Preg Test, Ur Negative Negative  POCT Wet Prep [place])     Status: Abnormal   Collection Time: 04/07/18  4:10 PM  Result Value Ref Range   Source Wet Prep POC VAG    WBC, Wet Prep HPF POC 1-5    Bacteria Wet Prep HPF POC Many (A)  Few   Clue Cells Wet Prep HPF POC Moderate (A) None   Clue Cells Wet Prep Whiff POC Positive Whiff    Yeast Wet Prep HPF POC None None   Trichomonas Wet Prep HPF POC Absent Absent    Assessment/Plan:  Acute cystitis with hematuria UA positive for nitrites, small leukocytes, small blood.  Patient has a history of UTI with resistance to all oral medications aside from Cipro and nitrofurantoin on most recent culture November 2019.  She was most recently treated for UTI with Bactrim in January, but states that she does not think it was never fully treated.  She does not have any evidence of pyelonephritis on exam.  We will treat with Cipro due to resistant cultures in the past. -Cipro 250 mg twice daily x3 days -Urine culture -Advised patient to start using cranberry pills and probiotic daily  Bacterial vaginosis Patient with white watery discharge on exam.  Moderate clue cells with positive whiff test on wet prep.  Patient also has 1-5 white blood cells.  Failed use of MetroGel at home, therefore would not recommend repeating at this time.  Patient states that she has taken Flagyl in the past and has not tolerated this well.  She would prefer another topical treatment.  Negative urine pregnancy test. - Clindamycin gel 1% twice daily x3 days -Gonorrhea and chlamydia culture pending -Probiotic     PATIENT EDUCATION PROVIDED: See AVS    Diagnosis and plan along with any newly prescribed medication(s) were discussed in detail with this patient today. The patient verbalized understanding and agreed with the plan. Patient advised if symptoms worsen return to clinic or ER.     Orders Placed This Encounter  Procedures  . Urine Culture  . POCT urinalysis dipstick  . POCT Wet Prep [employer])  . POCT UA - Microscopic Only  . POCT urine pregnancy    Meds ordered this encounter  Medications  . ciprofloxacin (CIPRO) 250 MG tablet    Sig: Take 1 tablet (250 mg total) by mouth 2 (two) times  daily.    Dispense:  6 tablet    Refill:  0  . DISCONTD: metroNIDAZOLE (FLAGYL) 500 MG tablet    Sig: Take 1 tablet (500 mg total) by mouth 2 (two) times daily for 7 days.    Dispense:  14 tablet    Refill:  0  . clindamycin (CLINDAGEL) 1 % gel    Sig: Apply topically 2 (two) times daily for 3 days.    Dispense:  30 g    Refill:  0  Luis Abed, DO 04/07/2018, 4:46 PM PGY-1 [facility]

## 2018-04-08 ENCOUNTER — Other Ambulatory Visit: Payer: Self-pay | Admitting: *Deleted

## 2018-04-08 DIAGNOSIS — N760 Acute vaginitis: Principal | ICD-10-CM

## 2018-04-08 DIAGNOSIS — B9689 Other specified bacterial agents as the cause of diseases classified elsewhere: Secondary | ICD-10-CM

## 2018-04-08 MED ORDER — CLINDAMYCIN PHOSPHATE 1 % EX GEL: Freq: Two times a day (BID) | CUTANEOUS | 0 refills | Status: AC

## 2018-04-09 LAB — CERVICOVAGINAL ANCILLARY ONLY
Chlamydia: NEGATIVE
Neisseria Gonorrhea: NEGATIVE

## 2018-04-10 LAB — URINE CULTURE

## 2018-04-12 ENCOUNTER — Telehealth: Payer: Self-pay | Admitting: *Deleted

## 2018-04-12 NOTE — Telephone Encounter (Signed)
Received fax from [facility] pharmacy requesting prior authorization of clindamycin gel .  Form placed in MD's box for completion along with Medicaid formulary.  Jone Baseman, CMA

## 2018-04-12 NOTE — Telephone Encounter (Signed)
Pt calls to check status.  Advised we would need to get permission from insurance.  In the meantime, she picked up flagyl ( it looks like it was d/c and still went to pharmacy) and is having issues keeping it down.  She states that she will continue trying this med, but would still like for [place] to attempt PA. Jone Baseman, CMA

## 2018-04-13 ENCOUNTER — Other Ambulatory Visit: Payer: Self-pay | Admitting: Family Medicine

## 2018-04-13 DIAGNOSIS — N760 Acute vaginitis: Principal | ICD-10-CM

## 2018-04-13 DIAGNOSIS — B9689 Other specified bacterial agents as the cause of diseases classified elsewhere: Secondary | ICD-10-CM

## 2018-04-13 MED ORDER — CLINDAMYCIN PHOSPHATE (1 DOSE) 2 % VA CREA: TOPICAL_CREAM | VAGINAL | 0 refills | Status: DC

## 2018-04-13 MED ORDER — CLINDAMYCIN PHOSPHATE 2 % VA CREA: 1.0000 | TOPICAL_CREAM | Freq: Every day | VAGINAL | 0 refills | Status: DC

## 2018-04-13 NOTE — Telephone Encounter (Signed)
Received PA on cleocin cream.  Upon review of formulary it must be Clindeese (name brand) for medicaid to pay for.  Verbal given from Dr. Obie Dredge to change (see med list).  Pharmacy ran and it went thru, but they will have to get from the warehouse.  Should be available after 3pm tomorrow.   Attempted to call pt but no answer and VM full.  Jone Baseman, CMA

## 2018-04-13 NOTE — Progress Notes (Signed)
See previous note. Jone Baseman, CMA

## 2018-04-13 NOTE — Addendum Note (Signed)
Addended by: Jone Baseman D on: 04/13/2018 05:21 PM   Modules accepted: Orders

## 2018-04-13 NOTE — Addendum Note (Signed)
Addended by: Jone Baseman D on: 04/13/2018 05:22 PM   Modules accepted: Orders

## 2018-04-13 NOTE — Telephone Encounter (Signed)
Pt informed.  Francenia Chimenti, CMA  

## 2018-04-13 NOTE — Telephone Encounter (Signed)
Sent Cleocin to pharmacy as it is covered by insurance.  Please let patient know.  She can stop the flagyl.

## 2018-04-13 NOTE — Progress Notes (Signed)
Rx sent for Cleocin, as it is preferred for insurance.

## 2018-04-29 NOTE — Telephone Encounter (Signed)
Pt called asking about vaginal cream from 15 + days ago. Informed patient we tried calling her back in March to inform her of medication approval and to pick the next day due to being on backorder, pt never answered and VM was full. Therefore, patient was never aware.   I called pharmacy yet again to see if this medication was available for pick today. Again, she never picked it up and it is again on backorder. Per pharmacy will be available tomorrow. Pt has been informed of this and instructed to contact pharmacy tomorrow.

## 2018-05-11 ENCOUNTER — Telehealth: Payer: Self-pay | Admitting: *Deleted

## 2018-05-11 NOTE — Telephone Encounter (Signed)
Pt calls because she used the clindamycin gel for BV but now she is having a "thick cottage cheese" type discharge.  She is sure that it is yeast because of the itching.  She would like me to ask Dr. Obie Dredge before she has to take time of work to come in for an appt.  Jone Baseman, CMA

## 2018-05-11 NOTE — Telephone Encounter (Signed)
Sounds like she should come in for appointment or if she really doesn't want to miss work, she could try a virtual visit.

## 2018-05-12 NOTE — Telephone Encounter (Signed)
Pt informed.  Scheduled for Friday.

## 2018-05-14 ENCOUNTER — Ambulatory Visit (INDEPENDENT_AMBULATORY_CARE_PROVIDER_SITE_OTHER): Payer: Medicaid Other | Admitting: Family Medicine

## 2018-05-14 ENCOUNTER — Other Ambulatory Visit (HOSPITAL_COMMUNITY)
Admission: RE | Admit: 2018-05-14 | Discharge: 2018-05-14 | Disposition: A | Discharge status: Home / Self Care | Payer: Medicaid Other | Source: Ambulatory Visit | Attending: Family Medicine | Admitting: Family Medicine

## 2018-05-14 ENCOUNTER — Other Ambulatory Visit: Payer: Self-pay

## 2018-05-14 ENCOUNTER — Encounter: Payer: Self-pay | Admitting: Family Medicine

## 2018-05-14 VITALS — BP 118/80 | HR 86

## 2018-05-14 DIAGNOSIS — B379 Candidiasis, unspecified: Secondary | ICD-10-CM | POA: Diagnosis not present

## 2018-05-14 DIAGNOSIS — N898 Other specified noninflammatory disorders of vagina: Secondary | ICD-10-CM | POA: Diagnosis present

## 2018-05-14 LAB — POCT WET PREP (WET MOUNT)
Clue Cells Wet Prep Whiff POC: NEGATIVE
Trichomonas Wet Prep HPF POC: ABSENT

## 2018-05-14 MED ORDER — FLUCONAZOLE 150 MG PO TABS: 150.0000 mg | ORAL_TABLET | Freq: Once | ORAL | 0 refills | Status: AC

## 2018-05-14 NOTE — Patient Instructions (Signed)
Vaginal Yeast infection, Adult    Vaginal yeast infection is a condition that causes vaginal discharge as well as soreness, swelling, and redness (inflammation) of the vagina. This is a common condition. Some women get this infection frequently.  What are the causes?  This condition is caused by a change in the normal balance of the yeast (candida) and bacteria that live in the vagina. This change causes an overgrowth of yeast, which causes the inflammation.  What increases the risk?  The condition is more likely to develop in women who:   Take antibiotic medicines.   Have diabetes.   Take birth control pills.   Are pregnant.   Douche often.   Have a weak body defense system (immune system).   Have been taking steroid medicines for a long time.   Frequently wear tight clothing.  What are the signs or symptoms?  Symptoms of this condition include:   White, thick, creamy vaginal discharge.   Swelling, itching, redness, and irritation of the vagina. The lips of the vagina (vulva) may be affected as well.   Pain or a burning feeling while urinating.   Pain during sex.  How is this diagnosed?  This condition is diagnosed based on:   Your medical history.   A physical exam.   A pelvic exam. Your health care provider will examine a sample of your vaginal discharge under a microscope. Your health care provider may send this sample for testing to confirm the diagnosis.  How is this treated?  This condition is treated with medicine. Medicines may be over-the-counter or prescription. You may be told to use one or more of the following:   Medicine that is taken by mouth (orally).   Medicine that is applied as a cream (topically).   Medicine that is inserted directly into the vagina (suppository).  Follow these instructions at home:    Lifestyle   Do not have sex until your health care provider approves. Tell your sex partner that you have a yeast infection. That person should go to his or her health care  provider and ask if they should also be treated.   Do not wear tight clothes, such as pantyhose or tight pants.   Wear breathable cotton underwear.  General instructions   Take or apply over-the-counter and prescription medicines only as told by your health care provider.   Eat more yogurt. This may help to keep your yeast infection from returning.   Do not use tampons until your health care provider approves.   Try taking a sitz bath to help with discomfort. This is a warm water bath that is taken while you are sitting down. The water should only come up to your hips and should cover your buttocks. Do this 3-4 times per day or as told by your health care provider.   Do not douche.   If you have diabetes, keep your blood sugar levels under control.   Keep all follow-up visits as told by your health care provider. This is important.  Contact a health care provider if:   You have a fever.   Your symptoms go away and then return.   Your symptoms do not get better with treatment.   Your symptoms get worse.   You have new symptoms.   You develop blisters in or around your vagina.   You have blood coming from your vagina and it is not your menstrual period.   You develop pain in your abdomen.  Summary     Vaginal yeast infection is a condition that causes discharge as well as soreness, swelling, and redness (inflammation) of the vagina.   This condition is treated with medicine. Medicines may be over-the-counter or prescription.   Take or apply over-the-counter and prescription medicines only as told by your health care provider.   Do not douche. Do not have sex or use tampons until your health care provider approves.   Contact a health care provider if your symptoms do not get better with treatment or your symptoms go away and then return.  This information is not intended to replace advice given to you by your health care provider. Make sure you discuss any questions you have with your health care  provider.  Document Released: 10/09/2004 Document Revised: 05/18/2017 Document Reviewed: 05/18/2017  Elsevier Interactive Patient Education  2019 Elsevier Inc.

## 2018-05-14 NOTE — Progress Notes (Signed)
     Subjective: Chief Complaint  Patient presents with  . Vaginal Discharge     HPI: Rachel Lloyd is a 33 y.o. presenting to clinic today to discuss the following:  Vaginal Discharge and Itching Patient complains of vaginal discharge and itching after taking an antibiotic to treat her recent diagnosis of BV. She was asked to return to clinic to get retested. Patient states "this happens to me a lot after taking antibiotics" and thinks she has a yeast infection. No new sexual partners or sexual activity since her treatment for BV.  ROS noted in HPI.   Past Medical, Surgical, Social, and Family History Reviewed & Updated per EMR.   Pertinent Historical Findings include:   Social History   Tobacco Use  Smoking Status Former Smoker  . Types: Cigarettes  . Last attempt to quit: 04/04/2010  . Years since quitting: 8.1  Smokeless Tobacco Never Used   Objective: BP 118/80   Pulse 86   SpO2 99%  Vitals and nursing notes reviewed  Physical Exam Vitals signs reviewed. Exam conducted with a chaperone present.  Genitourinary:    General: Normal vulva.     Pubic Area: No rash.      Labia:        Right: No rash, tenderness or lesion.        Left: No rash, tenderness or injury.      Vagina: Vaginal discharge present.     Cervix: Cervical bleeding present. No discharge.     Uterus: Normal.      Comments: White, thick discharge in vaginal vault   Microscopic wet-mount exam shows negative for pathogens, normal epithelial cells, DNA probe for chlamydia and GC obtained.   Assessment/Plan:  Yeast infection - Although wet prep did not show any signs of yeast infection given the amount of white, thick discharge and patient symptoms with recent antibiotic use I still think she has a yeast infection. - Diflucan 150mg  one time dose - GC/CC was negative   PATIENT EDUCATION PROVIDED: See AVS    Diagnosis and plan along with any newly prescribed medication(s) were discussed in  detail with this patient today. The patient verbalized understanding and agreed with the plan. Patient advised if symptoms worsen return to clinic or ER.    Orders Placed This Encounter  Procedures  . POCT Wet Prep [facility])    Meds ordered this encounter  Medications  . fluconazole (DIFLUCAN) 150 MG tablet    Sig: Take 1 tablet (150 mg total) by mouth once for 1 dose.    Dispense:  1 tablet    Refill:  0     Jules Schick, DO 05/14/2018, 4:32 PM PGY-2 [facility]

## 2018-05-18 LAB — CERVICOVAGINAL ANCILLARY ONLY
Chlamydia: NEGATIVE
Neisseria Gonorrhea: NEGATIVE

## 2018-05-20 ENCOUNTER — Telehealth: Payer: Self-pay | Admitting: *Deleted

## 2018-05-20 ENCOUNTER — Telehealth (INDEPENDENT_AMBULATORY_CARE_PROVIDER_SITE_OTHER): Payer: Medicaid Other | Admitting: Family Medicine

## 2018-05-20 ENCOUNTER — Other Ambulatory Visit: Payer: Self-pay

## 2018-05-20 DIAGNOSIS — R05 Cough: Secondary | ICD-10-CM | POA: Diagnosis present

## 2018-05-20 DIAGNOSIS — R059 Cough, unspecified: Secondary | ICD-10-CM | POA: Insufficient documentation

## 2018-05-20 NOTE — Assessment & Plan Note (Signed)
-   Although wet prep did not show any signs of yeast infection given the amount of white, thick discharge and patient symptoms with recent antibiotic use I still think she has a yeast infection. - Diflucan 150mg  one time dose - GC/CC was negative

## 2018-05-20 NOTE — Progress Notes (Signed)
Agrees to a video visit Cough began two weeks ago.  Began when turned on [facility].  This has been a problem in previous years.  Has previously been precsribed zrtec for season alllergies.  No asthma history.  Cough is improving.  No fever, no shortness of breath.   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  She has close contact with patient who would be serious outcome for COVID.  She also tells me she needs to get back to work ASAP for financial reasons - single mother with four children.    I will work with our lab, Molly Maduro, as to where to best test this patient. Time on phone 15 minutes.

## 2018-05-20 NOTE — Assessment & Plan Note (Addendum)
Cough is her only symptom.  But the nature of her work means that she must be tested or be symptom free before I can write a note authorizing her return.  Will arrange testing. Future order entered.

## 2018-05-20 NOTE — Telephone Encounter (Signed)
Discussed with Dr Leveda Anna that this patient needs to be tested for Covid-19 before returning to work. I contacted Ms laurent and told her she needs to contact the [facility] Department and get an appointment with them to be tested. Their testing site is set up at [facility] parking deck. She will need an appointment before proceeding to the testing site. Patient given the number to [facility] and will call to get this set up. Doris Cheadle Busick

## 2018-05-26 ENCOUNTER — Telehealth: Payer: Self-pay | Admitting: Family Medicine

## 2018-05-26 NOTE — Telephone Encounter (Signed)
Pt is calling back to check on the status of her results from her COVID testing. She said she spoke with Shanda Bumps yesterday and then missed a call from our office today.   Please call patient with the results as soon as possible.

## 2018-05-26 NOTE — Telephone Encounter (Signed)
Called patient and gave her the number to call the Health Department for results.  [facility] did not order testing and can not receive results unless there is a release of records signed by patient.  Number at [facility] Department is [phone].  Patient states that she went to [facility] and that the number she was given is  a recording. States that she is getting the runaround.  Glennie Hawk, CMA

## 2018-06-09 ENCOUNTER — Telehealth: Payer: Self-pay | Admitting: Family Medicine
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
## 2018-06-22 ENCOUNTER — Other Ambulatory Visit: Payer: Self-pay

## 2018-06-22 ENCOUNTER — Ambulatory Visit (INDEPENDENT_AMBULATORY_CARE_PROVIDER_SITE_OTHER): Payer: Medicaid Other | Admitting: Family Medicine

## 2018-06-22 VITALS — BP 102/60 | HR 84 | Wt 129.4 lb

## 2018-06-22 DIAGNOSIS — R52 Pain, unspecified: Secondary | ICD-10-CM

## 2018-06-22 NOTE — Patient Instructions (Signed)
Heat, bengay, stretches, tylenol or motrin.    Neck Exercises Neck exercises can be important for many reasons:  They can help you to improve and maintain flexibility in your neck. This can be especially important as you age.  They can help to make your neck stronger. This can make movement easier.  They can reduce or prevent neck pain.  They may help your upper back. Ask your health care provider which neck exercises would be best for you. Exercises to improve neck flexibility Neck stretch Repeat this exercise 3-5 times. 1. Do this exercise while standing or while sitting in a chair. 2. Place your feet flat on the floor, shoulder-width apart. 3. Slowly turn your head to the right. Turn it all the way to the right so you can look over your right shoulder. Do not tilt or tip your head. 4. Hold this position for 10-30 seconds. 5. Slowly turn your head to the left, to look over your left shoulder. 6. Hold this position for 10-30 seconds.  Neck retraction Repeat this exercise 8-10 times. Do this 3-4 times a day or as told by your health care provider. 1. Do this exercise while standing or while sitting in a sturdy chair. 2. Look straight ahead. Do not bend your neck. 3. Use your fingers to push your chin backward. Do not bend your neck for this movement. Continue to face straight ahead. If you are doing the exercise properly, you will feel a slight sensation in your throat and a stretch at the back of your neck. 4. Hold the stretch for 1-2 seconds. Relax and repeat. Exercises to improve neck strength Neck press Repeat this exercise 10 times. Do it first thing in the morning and right before bed or as told by your health care provider. 1. Lie on your back on a firm bed or on the floor with a pillow under your head. 2. Use your neck muscles to push your head down on the pillow and straighten your spine. 3. Hold the position as well as you can. Keep your head facing up and your chin  tucked. 4. Slowly count to 5 while holding this position. 5. Relax for a few seconds. Then repeat. Isometric strengthening Do a full set of these exercises 2 times a day or as told by your health care provider. 1. Sit in a supportive chair and place your hand on your forehead. 2. Push forward with your head and neck while pushing back with your hand. Hold for 10 seconds. 3. Relax. Then repeat the exercise 3 times. 4. Next, do thesequence again, this time putting your hand against the back of your head. Use your head and neck to push backward against the hand pressure. 5. Finally, do the same exercise on either side of your head, pushing sideways against the pressure of your hand. Prone head lifts Repeat this exercise 5 times. Do this 2 times a day or as told by your health care provider. 1. Lie face-down, resting on your elbows so that your chest and upper back are raised. 2. Start with your head facing downward, near your chest. Position your chin either on or near your chest. 3. Slowly lift your head upward. Lift until you are looking straight ahead. Then continue lifting your head as far back as you can stretch. 4. Hold your head up for 5 seconds. Then slowly lower it to your starting position. Supine head lifts Repeat this exercise 8-10 times. Do this 2 times a day or  as told by your health care provider. 1. Lie on your back, bending your knees to point to the ceiling and keeping your feet flat on the floor. 2. Lift your head slowly off the floor, raising your chin toward your chest. 3. Hold for 5 seconds. 4. Relax and repeat. Scapular retraction Repeat this exercise 5 times. Do this 2 times a day or as told by your health care provider. 1. Stand with your arms at your sides. Look straight ahead. 2. Slowly pull both shoulders backward and downward until you feel a stretch between your shoulder blades in your upper back. 3. Hold for 10-30 seconds. 4. Relax and repeat. Contact a health  care provider if:  Your neck pain or discomfort gets much worse when you do an exercise.  Your neck pain or discomfort does not improve within 2 hours after you exercise. If you have any of these problems, stop exercising right away. Do not do the exercises again unless your health care provider says that you can. Get help right away if:  You develop sudden, severe neck pain. If this happens, stop exercising right away. Do not do the exercises again unless your health care provider says that you can. This information is not intended to replace advice given to you by your health care provider. Make sure you discuss any questions you have with your health care provider. Document Released: 12/11/2014 Document Revised: 05/05/2017 Document Reviewed: 07/10/2014 Elsevier Interactive Patient Education  2019 [employer]
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
## 2018-06-25 ENCOUNTER — Other Ambulatory Visit: Payer: Self-pay

## 2018-06-25 ENCOUNTER — Ambulatory Visit (INDEPENDENT_AMBULATORY_CARE_PROVIDER_SITE_OTHER): Payer: Medicaid Other

## 2018-06-25 ENCOUNTER — Encounter (HOSPITAL_COMMUNITY): Payer: Self-pay

## 2018-06-25 ENCOUNTER — Ambulatory Visit (HOSPITAL_COMMUNITY)
Admission: EM | Admit: 2018-06-25 | Discharge: 2018-06-25 | Disposition: A | Discharge status: Home / Self Care | Payer: Medicaid Other | Attending: Family Medicine | Admitting: Family Medicine

## 2018-06-25 DIAGNOSIS — M5432 Sciatica, left side: Secondary | ICD-10-CM

## 2018-06-25 DIAGNOSIS — M7918 Myalgia, other site: Secondary | ICD-10-CM

## 2018-06-25 MED ORDER — METHYLPREDNISOLONE 4 MG PO TBPK: ORAL_TABLET | ORAL | 0 refills | Status: DC

## 2018-06-25 MED ORDER — METAXALONE 800 MG PO TABS: 800.0000 mg | ORAL_TABLET | Freq: Three times a day (TID) | ORAL | 0 refills | Status: DC
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
## 2018-06-25 NOTE — Discharge Instructions (Signed)
Take the prednisone pack as directed.  Take all of day 1 today.  This will take down the swelling and inflammation of the nerve. Take the metaxalone (Skelaxin) 3 times a day as needed.  This is a muscle relaxer.  This muscle relaxer is not supposed to cause a lot of drowsiness.  If you do not use it during the daytime, it is useful at night I think you need another couple days off work.  Giving a note to go back on Monday.  Call your PCP if your pain persists
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
## 2018-06-28 NOTE — Telephone Encounter (Signed)
Will need to see patient in clinic for this issue prior to referral for neurology, attempted to call patient to elicit more information but unfortunately no answer.  Guadalupe Dawn MD PGY-2 Family Medicine Resident

## 2018-07-11 ENCOUNTER — Other Ambulatory Visit: Payer: Self-pay

## 2018-07-11 ENCOUNTER — Encounter (HOSPITAL_COMMUNITY): Payer: Self-pay | Admitting: *Deleted

## 2018-07-11 ENCOUNTER — Ambulatory Visit (HOSPITAL_COMMUNITY)
Admission: EM | Admit: 2018-07-11 | Discharge: 2018-07-11 | Disposition: A | Discharge status: Home / Self Care | Payer: Medicaid Other | Attending: Emergency Medicine | Admitting: Emergency Medicine

## 2018-07-11 DIAGNOSIS — N898 Other specified noninflammatory disorders of vagina: Secondary | ICD-10-CM

## 2018-07-11 LAB — POCT URINALYSIS DIP (DEVICE)
Bilirubin Urine: NEGATIVE
Glucose, UA: NEGATIVE mg/dL
Ketones, ur: NEGATIVE mg/dL
Leukocytes,Ua: NEGATIVE
Nitrite: NEGATIVE
Protein, ur: NEGATIVE mg/dL
Specific Gravity, Urine: >=1.03 (ref 1.005–1.030)
Urobilinogen, UA: 0.2 mg/dL (ref 0.0–1.0)
pH: 7 (ref 5.0–8.0)

## 2018-07-11 LAB — POCT PREGNANCY, URINE: Preg Test, Ur: NEGATIVE

## 2018-07-11 MED ORDER — METRONIDAZOLE 0.75 % VA GEL: 1.0000 | Freq: Every day | VAGINAL | 0 refills | Status: AC

## 2018-07-11 MED ORDER — FLUCONAZOLE 150 MG PO TABS: 150.0000 mg | ORAL_TABLET | Freq: Once | ORAL | 0 refills | Status: AC

## 2018-07-11 NOTE — Discharge Instructions (Signed)
Please begin using metronidazole vaginal cream nightly at bedtime May take 1 pill of Diflucan today, may repeat after completion of metronidazole  We will send the swab off to check for STDs Your urine was normal, pregnancy negative

## 2018-07-11 NOTE — ED Triage Notes (Signed)
Pt c/o malodorous vaginal discharge x 3-4 days.  Denies fevers.  Has had some slight abd cramping.  Also requesting pregnancy test.
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
## 2018-07-13 LAB — CERVICOVAGINAL ANCILLARY ONLY
Bacterial vaginitis: POSITIVE — AB
Candida vaginitis: NEGATIVE
Chlamydia: NEGATIVE
Neisseria Gonorrhea: NEGATIVE
Trichomonas: NEGATIVE

## 2018-08-17 ENCOUNTER — Ambulatory Visit (INDEPENDENT_AMBULATORY_CARE_PROVIDER_SITE_OTHER): Payer: Medicaid Other | Admitting: Family Medicine

## 2018-08-17 ENCOUNTER — Other Ambulatory Visit: Payer: Self-pay

## 2018-08-17 ENCOUNTER — Ambulatory Visit: Payer: Medicaid Other | Admitting: Family Medicine

## 2018-08-17 VITALS — BP 100/70 | HR 81

## 2018-08-17 DIAGNOSIS — G4710 Hypersomnia, unspecified: Secondary | ICD-10-CM

## 2018-08-17 DIAGNOSIS — R400 Somnolence: Secondary | ICD-10-CM | POA: Diagnosis not present

## 2018-08-17 LAB — POCT URINE PREGNANCY: Preg Test, Ur: NEGATIVE

## 2018-08-17 NOTE — Progress Notes (Signed)
   Subjective:    Patient ID: Rachel Lloyd, female    DOB: 05/10/1985, 33 y.o.   MRN: [id number]   CC: increases sleepiness   HPI: Increased sleepiness  Patient presenting today reporting that she is sleeping very hard.  Reports that she is scared because she feels like her kids might need her and she will not wake up.  Has even stated that she has slept through intercourse.  Does state that she is safe in all her relationships and does not require any resources for assistance in getting out of a danger situation.  Does report being lightheaded at work and has to sit down.  Is a travel CNA.  States that she usually sleeps 6 hours a night, sometimes less if she has a double shift.  But states that this sleepiness has been occurring since she was a child and her mother reports that she was very sleepy and is a deep sleeper as a child.  Does smoke marijuana recreationally.  Is a social alcohol drinker, 1 glass of wine a month.  Denies any tobacco use.  Does not think she snores, may be hearing there.  Sexually active, last unprotected intercourse 2 days ago.  Reports that she has never fallen asleep while driving, or had any accidents secondary to sleepiness.  States that this is a one-time she is not sleepy.  Objective:  BP 100/70   Pulse 81   SpO2 99%  Vitals and nursing note reviewed  General: well nourished, in no acute distress HEENT: normocephalic, PERRL, no scleral icterus or conjunctival pallor, no nasal discharge, moist mucous membranes, good dentition without erythema or discharge noted in posterior oropharynx Neck: supple, non-tender, without lymphadenopathy Cardiac: RRR, clear S1 and S2, no murmurs, rubs, or gallops Respiratory: clear to auscultation bilaterally, no increased work of breathing Abdomen: soft, nontender, nondistended, no masses or organomegaly. Bowel sounds present Extremities: no edema or cyanosis. Warm, well perfused. 2+ radial and PT pulses bilaterally Skin: warm  and dry, no rashes noted Neuro: alert and oriented, no focal deficits, CN2-12 intact, sensation intact bilaterally, 5/5 muscle strength, normal grip strength    Assessment & Plan:    Sleepiness Patient presenting with excessive sleepiness.  Unclear etiology at this time.  Urine pregnancy test negative.  Can consider thyroid etiologies will obtain TSH.  Can consider anemia so obtain CBC.  Epworth score 17, showing moderate risk of narcolepsy.  Given this risk we will refer to sleep clinic for likely sleep study.  Patient reports that she has never been in a car accident secondary to sleepiness and that is the one time she knows she will not be tired.  Advised to obtain at least 8 hours of sleep a day.  Strict return precautions given.  Follow-up after sleep study.   Discussed with Dr. Andria Frames and Dr. Gwendlyn Deutscher  Return in about 1 month (around 09/17/2018).   Caroline More, DO, PGY-3

## 2018-08-17 NOTE — Patient Instructions (Signed)
Narcolepsy  Narcolepsy is a neurological disorder that causes you to fall asleep suddenly, and without control, during the daytime (sleep attacks). Narcolepsy is a lifelong (chronic) disorder.  Normally, sleep follows a regular cycle over the course of the night. After about 90 minutes of light sleep, your sleep should become deeper. When your sleep becomes deeper, your body moves less and you start dreaming. This type of deep sleep is called rapid eye movement (REM) sleep. When you have narcolepsy, your REM sleep is not well-regulated. This disrupts your sleep cycle, which causes daytime sleepiness.  What are the causes?  The cause of narcolepsy is not fully understood, but it may be related to:  Low levels of hypocretin, a chemical (neurotransmitter) in the brain that controls sleep and wake cycles. Hypocretin imbalance may be caused by:  Abnormal genes that are passed from parent to child (inherited).  The body's defense system (immune system) attacking hypocretin brain cells (autoimmune disease).  Infection, tumor, or injury in the area of the brain that controls sleep.  Exposure to poisons (toxins), such as heavy metals, pesticides, and secondhand smoke.  What are the signs or symptoms?  Symptoms of this condition include:  Excessive daytime sleepiness. This is the most common symptom and is usually the first symptom you will notice. This may affect your performance at work or school.  Sleep attacks. This means falling asleep suddenly and without control. You may fall asleep in the middle of an activity, especially low-energy activities like reading or watching TV.  Feeling like you cannot think clearly.  Trouble focusing or remembering things.  Feeling depressed.  Sudden muscle weakness (cataplexy). When this occurs, your speech may become slurred, or your knees may buckle. Cataplexy is usually triggered by surprise, anger, fear, or laughter.  Loss of the ability to speak or move (sleep paralysis). This may  occur just as you start to fall asleep or wake up. You will be aware of the paralysis. It usually lasts for just a few seconds or minutes.  Seeing, hearing, tasting, smelling, or feeling things that are not real (hallucinations). Hallucinations may occur with sleep paralysis. They can happen when you are falling asleep, waking up, or dozing.  Trouble staying asleep at night (insomnia).  Restless sleep.  How is this diagnosed?  This condition may be diagnosed based on:  A physical exam to rule out any other problems that may be causing your symptoms. You may be asked to write down your sleeping patterns for several weeks in a sleep diary. This will help your health care provider make a diagnosis.  Sleep studies that measure how well your REM sleep is regulated. These tests also measure your heart rate, breathing, movement, and brain waves. These tests include:  An overnight sleep study (polysomnogram).  A daytime sleep study that is done while you take several naps during the day (multiple sleep latency test, MSLT). This test measures how quickly you fall asleep and how quickly you enter REM sleep.  Removal of spinal fluid to measure hypocretin levels.  How is this treated?  There is no cure for this condition, but treatment can help relieve symptoms. Treatment may include:  Lifestyle and sleeping strategies to help you cope with the condition, such as:  Exercising regularly.  Maintaining a regular sleep schedule.  Avoiding caffeine and large meals before bed.  Medicines. These may include:  Medicines that help keep you awake and alert (stimulants) to fight daytime sleepiness.  Medicines that treat depression (antidepressants).   is a strong medicine to help you relax (sedative) that you may take at night. It can help control daytime sleepiness and cataplexy. Follow these instructions at home: Sleeping  habits   Get about 8 hours of sleep every night.  Go to sleep and get up at about the same time every day.  Keep your bedroom dark, quiet, and comfortable.  When you feel very tired, take short naps. Schedule naps so that you take them at about the same time every day.  Tell your employer or teachers that you have narcolepsy. You may be able to adjust your schedule to include time for naps.  Before bedtime: ? Avoid bright lights and screens. ? Relax. Try activities like reading or taking a warm bath. Activity  Get at least 20 minutes of exercise every day. This will help you sleep better at night and reduce daytime sleepiness.  Avoid exercising within 3 hours of bedtime.  If you are sleepy, do not drive or use heavy machinery.  If possible, take a nap before driving.  Do not swim or go out on the water without a life jacket. Eating and drinking  Do not drink alcohol or caffeinated beverages within 4-5 hours of bedtime.  Do not eat a lot of food before bedtime. Eat meals at about the same times every day. General instructions   Take over-the-counter and prescription medicines only as told by your health care provider.  If directed, keep a sleep diary.  Tell your employer or teachers that you have narcolepsy. You may be able to adjust your schedule to include time for naps.  Do not use any products that contain nicotine or tobacco, such as cigarettes and e-cigarettes. If you need help quitting, ask your health care provider.  Keep all follow-up visits as told by your health care provider. This is important. Contact a health care provider if:  Your symptoms are not getting better.  You have increasingly high blood pressure (hypertension).  You have changes in your heart rhythm.  You are having a hard time determining what is real and what is not (psychosis). Get help right away if:  You hurt yourself during a sleep attack or an attack of cataplexy.  You have  chest pain.  You have trouble breathing. This information is not intended to replace advice given to you by your health care provider. Make sure you discuss any questions you have with your health care provider. Document Released: 12/20/2001 Document Revised: 12/12/2016 Document Reviewed: 12/24/2015 Elsevier Patient Education  [employer].  Please try and get 8 hrs of sleep a night  If you would like to speak to behavioral health here, please call and speak to [employer].

## 2018-08-18 ENCOUNTER — Telehealth: Payer: Self-pay | Admitting: *Deleted

## 2018-08-18 DIAGNOSIS — R400 Somnolence: Secondary | ICD-10-CM | POA: Insufficient documentation

## 2018-08-18 LAB — TSH+FREE T4
Free T4: 1.14 ng/dL (ref 0.82–1.77)
TSH: 1.08 u[IU]/mL (ref 0.450–4.500)

## 2018-08-18 LAB — CBC
Hematocrit: 38.3 % (ref 34.0–46.6)
Hemoglobin: 13.1 g/dL (ref 11.1–15.9)
MCH: 31 pg (ref 26.6–33.0)
MCHC: 34.2 g/dL (ref 31.5–35.7)
MCV: 91 fL (ref 79–97)
Platelets: 268 10*3/uL (ref 150–450)
RBC: 4.22 x10E6/uL (ref 3.77–5.28)
RDW: 13 % (ref 11.7–15.4)
WBC: 7.2 10*3/uL (ref 3.4–10.8)

## 2018-08-18 NOTE — Assessment & Plan Note (Signed)
Patient presenting with excessive sleepiness.  Unclear etiology at this time.  Urine pregnancy test negative.  Can consider thyroid etiologies will obtain TSH.  Can consider anemia so obtain CBC.  Epworth score 17, showing moderate risk of narcolepsy.  Given this risk we will refer to sleep clinic for likely sleep study.  Patient reports that she has never been in a car accident secondary to sleepiness and that is the one time she knows she will not be tired.  Advised to obtain at least 8 hours of sleep a day.  Strict return precautions given.  Follow-up after sleep study.

## 2018-08-18 NOTE — Telephone Encounter (Signed)
Pt informed. Rachel Lloyd, CMA  

## 2018-08-18 NOTE — Telephone Encounter (Signed)
-----   Message from Caroline More, DO sent at 08/18/2018  8:59 AM EDT ----- Please inform patient that results of thyroid panel and CBC are negative.

## 2018-09-01 ENCOUNTER — Other Ambulatory Visit: Payer: Self-pay

## 2018-09-01 ENCOUNTER — Ambulatory Visit (INDEPENDENT_AMBULATORY_CARE_PROVIDER_SITE_OTHER): Payer: Medicaid Other | Admitting: Family Medicine

## 2018-09-01 ENCOUNTER — Encounter: Payer: Self-pay | Admitting: Family Medicine

## 2018-09-01 ENCOUNTER — Other Ambulatory Visit (HOSPITAL_COMMUNITY)
Admission: RE | Admit: 2018-09-01 | Discharge: 2018-09-01 | Disposition: A | Discharge status: Home / Self Care | Payer: Medicaid Other | Source: Ambulatory Visit | Attending: Family Medicine | Admitting: Family Medicine

## 2018-09-01 VITALS — BP 122/82 | HR 60

## 2018-09-01 DIAGNOSIS — Z3009 Encounter for other general counseling and advice on contraception: Secondary | ICD-10-CM | POA: Diagnosis not present

## 2018-09-01 DIAGNOSIS — N898 Other specified noninflammatory disorders of vagina: Secondary | ICD-10-CM | POA: Diagnosis not present

## 2018-09-01 DIAGNOSIS — Z113 Encounter for screening for infections with a predominantly sexual mode of transmission: Secondary | ICD-10-CM

## 2018-09-01 DIAGNOSIS — G43109 Migraine with aura, not intractable, without status migrainosus: Secondary | ICD-10-CM

## 2018-09-01 LAB — POCT WET PREP (WET MOUNT)
Clue Cells Wet Prep Whiff POC: POSITIVE
Trichomonas Wet Prep HPF POC: ABSENT

## 2018-09-01 MED ORDER — METRONIDAZOLE 0.75 % VA GEL: 1.0000 | Freq: Every day | VAGINAL | 0 refills | Status: AC

## 2018-09-01 MED ORDER — FLUCONAZOLE 150 MG PO TABS: ORAL_TABLET | ORAL | 0 refills | Status: DC

## 2018-09-01 NOTE — Assessment & Plan Note (Addendum)
Patient with vaginal discharge.  Unclear etiology at this time.  Physical exam showing thick white discharge.  We will also test with wet prep, GC/chlamydia, RPR, HIV, hepatitis C. wet prep showing bacterial vaginosis with positive clue cells and positive whiff test.  Will give MetroGel as patient states she tolerates this much better than oral metronidazole.  We will also give Diflucan as patient states she always gets a yeast infection after being treated with metronidazole.  Advised to use condoms with every sexual intercourse.  Strict return precautions given.  Follow-up in 2 weeks if no improvement.

## 2018-09-01 NOTE — Assessment & Plan Note (Signed)
Patient desires a new form of birth control but is unsure which kind.  Discussed the various types of birth control and after visit summary given with all the forms of birth control.  Unfortunately given history of migraine headaches with aura this limits our use of combined oral contraceptives or patch.  Discussed this with patient.  States that she would like to think about it before deciding right away.  Would like to have more information to read.  Strict return precautions given.  Advised to follow-up as soon as she decides which type of birth control she wants so that we may obtain a pregnancy test and start birth control.  Advised to use condoms with every sexual intercourse.

## 2018-09-01 NOTE — Progress Notes (Signed)
   Subjective:    Patient ID: Rachel Lloyd, female    DOB: 1985-12-29, 33 y.o.   MRN: [id number]   CC: vaginal discharge  HPI: VAGINAL DISCHARGE Onset: 5 days    Description: white   Odor: fishy    Itching: yes   Symptoms Dysuria: no Bleeding: no  Pelvic pain: yes  Back pain: no  Fever: no  Genital sores: no  Rash: no  Dyspareunia: no  GI Sxs: no  Prior treatment: Took a diflucan 2 days ago   Red Flags: Missed period: no  Pregnancy: no  Recent antibiotics: no  Sexual activity: yes  Possible STD exposure: yes  IUD: no  Diabetes: no   Contraception Patient reports that she no longer desires Depo-Provera for contraception.  States that she does not like the weight loss she feels with resulting from Depo.  Is unsure which form of birth control she wants.  Does report a history of migraine headaches with aura.  Denies any personal or family history of blood clots.  Objective:  BP 122/82   Pulse 60   SpO2 99%  Vitals and nursing note reviewed  General: well nourished, in no acute distress HEENT: normocephalic  Skin: warm and dry, no rashes noted Neuro: alert and oriented, no focal deficits Female genitalia: normal external genitalia, vulva, vagina, cervix, uterus and adnexa, thick white discharge noted  Assessment & Plan:    Vaginal discharge Patient with vaginal discharge.  Unclear etiology at this time.  Physical exam showing thick white discharge.  We will also test with wet prep, GC/chlamydia, RPR, HIV, hepatitis C. wet prep showing bacterial vaginosis with positive clue cells and positive whiff test.  Will give MetroGel as patient states she tolerates this much better than oral metronidazole.  We will also give Diflucan as patient states she always gets a yeast infection after being treated with metronidazole.  Advised to use condoms with every sexual intercourse.  Strict return precautions given.  Follow-up in 2 weeks if no improvement.  Contraception  management Patient desires a new form of birth control but is unsure which kind.  Discussed the various types of birth control and after visit summary given with all the forms of birth control.  Unfortunately given history of migraine headaches with aura this limits our use of combined oral contraceptives or patch.  Discussed this with patient.  States that she would like to think about it before deciding right away.  Would like to have more information to read.  Strict return precautions given.  Advised to follow-up as soon as she decides which type of birth control she wants so that we may obtain a pregnancy test and start birth control.  Advised to use condoms with every sexual intercourse.    Return in about 2 weeks (around 09/15/2018), or if symptoms worsen or fail to improve, for vaginal discharge, ASAP for birth control.   Caroline More, DO, PGY-3

## 2018-09-01 NOTE — Patient Instructions (Addendum)
It was a pleasure seeing you today.   Today we discussed vaginal discharge  For your discharge: I will give you a prescription for diflucan. Take one pill today and a second pill in 3 days.  Continue to use condoms with every episode of intercourse to prevent STD exposure.   Please follow up in 2 weeks if no improvement or sooner if symptoms persist or worsen. Please call the clinic immediately if you have any concerns.   Our clinic's number is [phone]. Please call with questions or concerns.    Thank you,  Caroline More, DO    Contraception Choices Contraception, also called birth control, means things to use or ways to try not to get pregnant. Hormonal birth control This kind of birth control uses hormones. Here are some types of hormonal birth control:  A tube that is put under skin of the arm (implant). The tube can stay in for as long as 3 years.  Shots to get every 3 months (injections).  Pills to take every day (birth control pills).  A patch to change 1 time each week for 3 weeks (birth control patch). After that, the patch is taken off for 1 week.  A ring to put in the vagina. The ring is left in for 3 weeks. Then it is taken out of the vagina for 1 week. Then a new ring is put in.  Pills to take after unprotected sex (emergency birth control pills). Barrier birth control Here are some types of barrier birth control:  A thin covering that is put on the penis before sex (female condom). The covering is thrown away after sex.  A soft, loose covering that is put in the vagina before sex (female condom). The covering is thrown away after sex.  A rubber bowl that sits over the cervix (diaphragm). The bowl must be made for you. The bowl is put into the vagina before sex. The bowl is left in for 6-8 hours after sex. It is taken out within 24 hours.  A small, soft cup that fits over the cervix (cervical cap). The cup must be made for you. The cup can be left in for  6-8 hours after sex. It is taken out within 48 hours.  A sponge that is put into the vagina before sex. It must be left in for at least 6 hours after sex. It must be taken out within 30 hours. Then it is thrown away.  A chemical that kills or stops sperm from getting into the uterus (spermicide). It may be a pill, cream, jelly, or foam to put in the vagina. The chemical should be used at least 10-15 minutes before sex. IUD (intrauterine) birth control An IUD is a small, T-shaped piece of plastic. It is put inside the uterus. There are two kinds:  Hormone IUD. This kind can stay in for 3-5 years.  Copper IUD. This kind can stay in for 10 years. Permanent birth control Here are some types of permanent birth control:  Surgery to block the fallopian tubes.  Having an insert put into each fallopian tube.  Surgery to tie off the tubes that carry sperm (vasectomy). Natural planning birth control Here are some types of natural planning birth control:  Not having sex on the days the woman could get pregnant.  Using a calendar: ? To keep track of the length of each period. ? To find out what days pregnancy can happen. ? To plan to not have sex on  days when pregnancy can happen.  Watching for symptoms of ovulation and not having sex during ovulation. One way the woman can check for ovulation is to check her temperature.  Waiting to have sex until after ovulation. Summary  Contraception, also called birth control, means things to use or ways to try not to get pregnant.  Hormonal methods of birth control include implants, injections, pills, patches, vaginal rings, and emergency birth control pills.  Barrier methods of birth control can include female condoms, female condoms, diaphragms, cervical caps, sponges, and spermicides.  There are two types of IUD (intrauterine device) birth control. An IUD can be put in a woman's uterus to prevent pregnancy for 3-5 years.  Permanent sterilization  can be done through a procedure for males, females, or both.  Natural planning methods involve not having sex on the days when the woman could get pregnant. This information is not intended to replace advice given to you by your health care provider. Make sure you discuss any questions you have with your health care provider. Document Released: 10/27/2008 Document Revised: 04/21/2018 Document Reviewed: 01/10/2016 Elsevier Patient Education  2020 [employer]

## 2018-09-02 ENCOUNTER — Telehealth: Payer: Self-pay | Admitting: *Deleted

## 2018-09-02 ENCOUNTER — Telehealth: Payer: Self-pay

## 2018-09-02 LAB — CERVICOVAGINAL ANCILLARY ONLY
Chlamydia: NEGATIVE
Neisseria Gonorrhea: NEGATIVE

## 2018-09-02 LAB — RPR: RPR Ser Ql: NONREACTIVE

## 2018-09-02 LAB — HCV COMMENT:

## 2018-09-02 LAB — HIV ANTIBODY (ROUTINE TESTING W REFLEX): HIV Screen 4th Generation wRfx: NONREACTIVE

## 2018-09-02 LAB — HEPATITIS C ANTIBODY (REFLEX): HCV Ab: <0.1 s/co ratio (ref 0.0–0.9)

## 2018-09-02 NOTE — Telephone Encounter (Signed)
Pt informed. Ralyn Stlaurent, CMA  

## 2018-09-02 NOTE — Telephone Encounter (Signed)
-----   Message from Rachel More, DO sent at 09/02/2018  1:27 PM EDT ----- Please inform patient that results of GC/chlamydia are negative.

## 2018-09-02 NOTE — Telephone Encounter (Signed)
Informed patient of negative results per Dr. Abraham.  .Christophere Hillhouse R Yarely Bebee, CMA  

## 2018-10-04 ENCOUNTER — Ambulatory Visit (INDEPENDENT_AMBULATORY_CARE_PROVIDER_SITE_OTHER): Payer: Medicaid Other | Admitting: Family Medicine

## 2018-10-04 ENCOUNTER — Other Ambulatory Visit: Payer: Self-pay

## 2018-10-04 VITALS — BP 102/70 | HR 95 | Ht 62.0 in | Wt 133.1 lb

## 2018-10-04 DIAGNOSIS — Z3201 Encounter for pregnancy test, result positive: Secondary | ICD-10-CM | POA: Diagnosis present

## 2018-10-04 DIAGNOSIS — Z3009 Encounter for other general counseling and advice on contraception: Secondary | ICD-10-CM

## 2018-10-04 LAB — POCT URINE PREGNANCY: Preg Test, Ur: POSITIVE — AB

## 2018-10-04 MED ORDER — MEDROXYPROGESTERONE ACETATE 150 MG/ML IM SUSP: 150.0000 mg | Freq: Once | INTRAMUSCULAR | Status: DC
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
## 2018-10-04 NOTE — Patient Instructions (Signed)
Thank you for coming to see me today. It was a pleasure! Today we talked about:   Please schedule an initial OB appointment.   Please follow-up as soon as possible.  If you have any questions or concerns, please do not hesitate to call the office at [phone].  Take Care,   Martinique Oreta Soloway, DO

## 2018-10-06 DIAGNOSIS — Z3201 Encounter for pregnancy test, result positive: Secondary | ICD-10-CM | POA: Insufficient documentation

## 2018-10-06 MED ORDER — PRENATAL VITAMIN 27-0.8 MG PO TABS: 1.0000 | ORAL_TABLET | Freq: Every day | ORAL | 3 refills | Status: DC

## 2018-10-06 NOTE — Assessment & Plan Note (Addendum)
Patient presented for contraception management and to receive depo injection, however pregnancy test was positive.  Patient tearful and asked to leave the room immediately stating that she has people and kids waiting on her in the car.  She did not want any further counseling.  Did request that patient schedule her initial OB if she decides that she would like to go through this pregnancy.  Patient continuously states that this pregnancy may " kill her" as every pregnancy has been difficult for her.  She is a [id number].  Patient then left office after stating that she would schedule her initial OB appointment to discuss.  She states that she is safe to herself and others.  09/23: Called patient after seeing that she had not started her initial OB.  Did discuss with her she would need to start prenatal vitamins and she states that she would think about scheduling her initial OB on Monday and would call the office.

## 2018-10-07 ENCOUNTER — Telehealth: Payer: Self-pay | Admitting: Family Medicine

## 2018-10-07 NOTE — Telephone Encounter (Signed)
Patient is asking to speak to Dr. Enid Derry who she law last week regarding pregnancy.  She is confused on how far along she is.  Please give her a call at [phone]

## 2018-10-07 NOTE — Telephone Encounter (Signed)
I do not know as we did not get to discuss dating. Patient needs to schedule her initial OB appointment where they can discuss dating and other recommendations.

## 2018-10-13 ENCOUNTER — Encounter: Payer: Medicaid Other | Admitting: Family Medicine

## 2018-10-15 ENCOUNTER — Ambulatory Visit (INDEPENDENT_AMBULATORY_CARE_PROVIDER_SITE_OTHER): Payer: Medicaid Other | Admitting: Family Medicine

## 2018-10-15 ENCOUNTER — Encounter: Payer: Self-pay | Admitting: Family Medicine

## 2018-10-15 ENCOUNTER — Other Ambulatory Visit: Payer: Self-pay

## 2018-10-15 VITALS — BP 100/76 | HR 83 | Wt 133.4 lb

## 2018-10-15 DIAGNOSIS — Z3481 Encounter for supervision of other normal pregnancy, first trimester: Secondary | ICD-10-CM

## 2018-10-15 DIAGNOSIS — F314 Bipolar disorder, current episode depressed, severe, without psychotic features: Secondary | ICD-10-CM | POA: Diagnosis not present

## 2018-10-15 MED ORDER — DOXYLAMINE-PYRIDOXINE 10-10 MG PO TBEC: 1.0000 | DELAYED_RELEASE_TABLET | Freq: Four times a day (QID) | ORAL | 0 refills | Status: AC | PRN

## 2018-10-15 NOTE — Patient Instructions (Signed)
It was a pleasure to see you today! Thank you for choosing [facility] for your primary care. Rachel Lloyd was seen for initial OB visit. Come back to the clinic if there is anything we can do for you.   Today we talked about assisting with your plans for dating of pregnancy.  We have ordered an ultrasound for you which is currently scheduled for October 6 at Tuesday at 10 AM.  This is at the [facility] clinic at [facility].  You will need to attend there with a full bladder.  You are offered normal prenatal screening but told us you have declined because you wish to terminate pregnancy, if any point you change your mind please contact us and we will do everything we can to facilitate your health  We also sent in some Diclegis which is an antinausea medicine remember this works best if you have it steady in your system and take regularly as opposed to only when he feel nauseous, do not take more than 4 tablets/day.  We have also referred you to a psychologist to try and talk through some of your mental health issues in dealing with the stress in your life as well as for the coronavirus pandemic.  If you don't hear from [place] in two weeks, please give [place] a call to verify your results. Otherwise, we look forward to seeing you again at your next visit. If you have any questions or concerns before then, please call the clinic at [phone].   Please bring all your medications to every doctors visit   Sign up for My Chart to have easy access to your labs results, and communication with your Primary care physician.     Please check-out at the front desk before leaving the clinic.     Best,  Dr. Sherene Sires FAMILY MEDICINE RESIDENT - PGY3 10/15/2018 2:50 PM

## 2018-10-15 NOTE — Progress Notes (Signed)
Subjective:  Rachel Lloyd is a 33 y.o. female who presents to the [facility] today with a chief complaint of pregnancy counseling.   HPI: Other than nausea, patient has no physical complaints.  No chest pain shortness of breath, bleeding, new vaginal discharge, pelvic pain.  She said that she was not expecting to be testing positive for pregnancy at her last visit and was very confused and emotional then.  She says since then she has had time to think and wants to terminate the pregnancy.  The only thing she wanted out of this visit was a dating ultrasound which she was frustrated to hear that we are unable to do here in clinic.  She refuses any and all other prenatal testing.  She says the nausea is generally only in the morning.  Other than that she is able to eat.  Other than wanting to address some stress and anxiety in her life which is exacerbated by this issue she has no other complaints of discussed at this time.  Objective:  Physical Exam: BP 100/76   Pulse 83   Wt 133 lb 6.4 oz (60.5 kg)   BMI 24.40 kg/m   Gen: Anxious, in no apparent distress, conversing comfortably CV: RRR with no murmurs appreciated Pulm: NWOB, CTAB with no crackles, wheezes, or rhonchi GI: Normal bowel sounds present. Soft, Nontender, Nondistended. GU*GU exam refused as patient does not want prenatal testing. MSK: no edema, cyanosis, or clubbing noted Skin: warm, dry Neuro: grossly normal, moves all extremities Psych: Normal affect and thought content  No results found for this or any previous visit (from the past 72 hour(s)).   Assessment/Plan:  Supervision of normal pregnancy G9 P4-0-4-4, patient found out unexpectedly at her last visit that she was pregnant.  This was not desired.  There was apparently miscommunication as patient says she was only coming to get her ultrasound dating because the clinic that she intends to get an abortion at told her that she has to be confirmed at least 6 weeks.  We  apologize for this confusion but told her that we cannot date at this clinic.  We did order an ultrasound for her which should take place on 6 October.  She is very unsure of her date by conception but thinks it might be approximately 6 to 8 weeks ago.  She has had no pelvic pain, no bleeding, no chest pain, no shortness of breath, no concerning physical symptoms other than nausea without vomiting.  Will prescribe Diclegis.  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  She refuses any and all prenatal labs and testing beyond the dating ultrasound.  She says that she does not need a referral to a clinic for the abortion because she already has one arranged.  Bipolar disorder (HCC) Anxiety and stress with history of bipolar disorder:patient says that she is safe and is not having any thoughts of hurting herself or anyone else.  She says that she is just been stressed out with all of this new news of pregnancy.  She says she is a single mother with 4 kids and does not have the ability to go through another pregnancy.  She does reference a couple times that "this pregnancy will kill me "but denies  any significant physical symptoms.  When pushed further to clarify she is talking about the stress.  Again denies any thoughts of hurting herself.  Says she has had a therapist in the past would like to establish with someone, referral placed   Sherene Sires, [place] - PGY3 10/17/2018 3:52 PM

## 2018-10-17 ENCOUNTER — Encounter (HOSPITAL_COMMUNITY): Payer: Self-pay

## 2018-10-17 ENCOUNTER — Inpatient Hospital Stay (HOSPITAL_COMMUNITY)
Admission: AD | Admit: 2018-10-17 | Discharge: 2018-10-17 | Disposition: A | Discharge status: Home / Self Care | Payer: Medicaid Other | Attending: Obstetrics and Gynecology | Admitting: Obstetrics and Gynecology

## 2018-10-17 ENCOUNTER — Other Ambulatory Visit: Payer: Self-pay

## 2018-10-17 ENCOUNTER — Encounter: Payer: Self-pay | Admitting: Family Medicine

## 2018-10-17 ENCOUNTER — Inpatient Hospital Stay (HOSPITAL_COMMUNITY): Payer: Medicaid Other

## 2018-10-17 DIAGNOSIS — O26891 Other specified pregnancy related conditions, first trimester: Secondary | ICD-10-CM

## 2018-10-17 DIAGNOSIS — Z87891 Personal history of nicotine dependence: Secondary | ICD-10-CM | POA: Diagnosis not present

## 2018-10-17 DIAGNOSIS — K117 Disturbances of salivary secretion: Secondary | ICD-10-CM | POA: Insufficient documentation

## 2018-10-17 DIAGNOSIS — Z880 Allergy status to penicillin: Secondary | ICD-10-CM | POA: Diagnosis not present

## 2018-10-17 DIAGNOSIS — R109 Unspecified abdominal pain: Secondary | ICD-10-CM | POA: Diagnosis not present

## 2018-10-17 DIAGNOSIS — O418X10 Other specified disorders of amniotic fluid and membranes, first trimester, not applicable or unspecified: Secondary | ICD-10-CM | POA: Diagnosis not present

## 2018-10-17 DIAGNOSIS — O26841 Uterine size-date discrepancy, first trimester: Secondary | ICD-10-CM

## 2018-10-17 DIAGNOSIS — O208 Other hemorrhage in early pregnancy: Secondary | ICD-10-CM | POA: Insufficient documentation

## 2018-10-17 DIAGNOSIS — Z3490 Encounter for supervision of normal pregnancy, unspecified, unspecified trimester: Secondary | ICD-10-CM

## 2018-10-17 DIAGNOSIS — O99611 Diseases of the digestive system complicating pregnancy, first trimester: Secondary | ICD-10-CM | POA: Insufficient documentation

## 2018-10-17 DIAGNOSIS — Z3A09 9 weeks gestation of pregnancy: Secondary | ICD-10-CM | POA: Diagnosis not present

## 2018-10-17 DIAGNOSIS — O468X1 Other antepartum hemorrhage, first trimester: Secondary | ICD-10-CM

## 2018-10-17 DIAGNOSIS — O210 Mild hyperemesis gravidarum: Secondary | ICD-10-CM | POA: Diagnosis not present

## 2018-10-17 LAB — WET PREP, GENITAL
Clue Cells Wet Prep HPF POC: NONE SEEN
Sperm: NONE SEEN
Trich, Wet Prep: NONE SEEN
Yeast Wet Prep HPF POC: NONE SEEN

## 2018-10-17 LAB — CBC
HCT: 39.8 % (ref 36.0–46.0)
Hemoglobin: 14.2 g/dL (ref 12.0–15.0)
MCH: 31.8 pg (ref 26.0–34.0)
MCHC: 35.7 g/dL (ref 30.0–36.0)
MCV: 89 fL (ref 80.0–100.0)
Platelets: 284 10*3/uL (ref 150–400)
RBC: 4.47 MIL/uL (ref 3.87–5.11)
RDW: 11.9 % (ref 11.5–15.5)
WBC: 8.5 10*3/uL (ref 4.0–10.5)
nRBC: 0 % (ref 0.0–0.2)

## 2018-10-17 LAB — HCG, QUANTITATIVE, PREGNANCY: hCG, Beta Chain, Quant, S: 28188 m[IU]/mL — ABNORMAL HIGH (ref <5)

## 2018-10-17 LAB — HIV ANTIBODY (ROUTINE TESTING W REFLEX): HIV Screen 4th Generation wRfx: NONREACTIVE

## 2018-10-17 MED ORDER — PROMETHAZINE HCL 25 MG PO TABS: 25.0000 mg | ORAL_TABLET | Freq: Four times a day (QID) | ORAL | 3 refills | Status: DC | PRN

## 2018-10-17 MED ORDER — TRAMADOL HCL 50 MG PO TABS: 50.0000 mg | ORAL_TABLET | Freq: Four times a day (QID) | ORAL | 0 refills | Status: DC | PRN

## 2018-10-17 MED ORDER — SODIUM CHLORIDE 0.9 % IV SOLN
25.0000 mg | Freq: Once | INTRAVENOUS | Status: AC
  Administered 2018-10-17: 25 mg via INTRAVENOUS
  Filled 2018-10-17: qty 1

## 2018-10-17 MED ORDER — FENTANYL CITRATE (PF) 100 MCG/2ML IJ SOLN
50.0000 ug | Freq: Once | INTRAMUSCULAR | Status: AC
  Administered 2018-10-17: 50 ug via INTRAVENOUS
  Filled 2018-10-17: qty 2

## 2018-10-17 MED ORDER — GLYCOPYRROLATE 2 MG PO TABS: 2.0000 mg | ORAL_TABLET | Freq: Three times a day (TID) | ORAL | 3 refills | Status: DC | PRN

## 2018-10-17 NOTE — MAU Note (Signed)
Patient tolerating po's at this time.

## 2018-10-17 NOTE — Discharge Instructions (Signed)
Hyperemesis Gravidarum °Hyperemesis gravidarum is a severe form of nausea and vomiting that happens during pregnancy. Hyperemesis is worse than morning sickness. It may cause you to have nausea or vomiting all day for many days. It may keep you from eating and drinking enough food and liquids, which can lead to dehydration, malnutrition, and weight loss. Hyperemesis usually occurs during the first half (the first 20 weeks) of pregnancy. It often goes away once a woman is in her second half of pregnancy. However, sometimes hyperemesis continues through an entire pregnancy. °What are the causes? °The cause of this condition is not known. It may be related to changes in chemicals (hormones) in the body during pregnancy, such as the high level of pregnancy hormone (human chorionic gonadotropin) or the increase in the female sex hormone (estrogen). °What are the signs or symptoms? °Symptoms of this condition include: °· Nausea that does not go away. °· Vomiting that does not allow you to keep any food down. °· Weight loss. °· Body fluid loss (dehydration). °· Having no desire to eat, or not liking food that you have previously enjoyed. °How is this diagnosed? °This condition may be diagnosed based on: °· A physical exam. °· Your medical history. °· Your symptoms. °· Blood tests. °· Urine tests. °How is this treated? °This condition is managed by controlling symptoms. This may include: °· Following an eating plan. This can help lessen nausea and vomiting. °· Taking prescription medicines. °An eating plan and medicines are often used together to help control symptoms. If medicines do not help relieve nausea and vomiting, you may need to receive fluids through an IV at the hospital. °Follow these instructions at home: °Eating and drinking ° °· Avoid the following: °? Drinking fluids with meals. Try not to drink anything during the 30 minutes before and after your meals. °? Drinking more than 1 cup of fluid at a  time. °? Eating foods that trigger your symptoms. These may include spicy foods, coffee, high-fat foods, very sweet foods, and acidic foods. °? Skipping meals. Nausea can be more intense on an empty stomach. If you cannot tolerate food, do not force it. Try sucking on ice chips or other frozen items and make up for missed calories later. °? Lying down within 2 hours after eating. °? Being exposed to environmental triggers. These may include food smells, smoky rooms, closed spaces, rooms with strong smells, warm or humid places, overly loud and noisy rooms, and rooms with motion or flickering lights. Try eating meals in a well-ventilated area that is free of strong smells. °? Quick and sudden changes in your movement. °? Taking iron pills and multivitamins that contain iron. If you take prescription iron pills, do not stop taking them unless your health care provider approves. °? Preparing food. The smell of food can spoil your appetite or trigger nausea. °· To help relieve your symptoms: °? Listen to your body. Everyone is different and has different preferences. Find what works best for you. °? Eat and drink slowly. °? Eat 5-6 small meals daily instead of 3 large meals. Eating small meals and snacks can help you avoid an empty stomach. °? In the morning, before getting out of bed, eat a couple of crackers to avoid moving around on an empty stomach. °? Try eating starchy foods as these are usually tolerated well. Examples include cereal, toast, bread, potatoes, pasta, rice, and pretzels. °? Include at least 1 serving of protein with your meals and snacks. Protein options include   lean meats, poultry, seafood, beans, nuts, nut butters, eggs, cheese, and yogurt. ? Try eating a protein-rich snack before bed. Examples of a protein-rick snack include cheese and crackers or a peanut butter sandwich made with 1 slice of whole-wheat bread and 1 tsp (5 g) of peanut butter. ? Eat or suck on things that have ginger in them.  It may help relieve nausea. Add  tsp ground ginger to hot tea or choose ginger tea. ? Try drinking 100% fruit juice or an electrolyte drink. An electrolyte drink contains sodium, potassium, and chloride. ? Drink fluids that are cold, clear, and carbonated or sour. Examples include lemonade, ginger ale, lemon-lime soda, ice water, and sparkling water. ? Brush your teeth or use a mouth rinse after meals. ? Talk with your health care provider about starting a supplement of vitamin B6. General instructions  Take over-the-counter and prescription medicines only as told by your health care provider.  Follow instructions from your health care provider about eating or drinking restrictions.  Continue to take your prenatal vitamins as told by your health care provider. If you are having trouble taking your prenatal vitamins, talk with your health care provider about different options.  Keep all follow-up and pre-birth (prenatal) visits as told by your health care provider. This is important. Contact a health care provider if:  You have pain in your abdomen.  You have a severe headache.  You have vision problems.  You are losing weight.  You feel weak or dizzy. Get help right away if:  You cannot drink fluids without vomiting.  You vomit blood.  You have constant nausea and vomiting.  You are very weak.  You faint.  You have a fever and your symptoms suddenly get worse. Summary  Hyperemesis gravidarum is a severe form of nausea and vomiting that happens during pregnancy.  Making some changes to your eating habits may help relieve nausea and vomiting.  This condition may be managed with medicine.  If medicines do not help relieve nausea and vomiting, you may need to receive fluids through an IV at the hospital. This information is not intended to replace advice given to you by your health care provider. Make sure you discuss any questions you have with your health care  provider. Document Released: 12/30/2004 Document Revised: 01/19/2017 Document Reviewed: 08/29/2015 Elsevier Patient Education  2020 [employer] Inc.    Abdominal Pain During Pregnancy  Abdominal pain is common during pregnancy, and has many possible causes. Some causes are more serious than others, and sometimes the cause is not known. Abdominal pain can be a sign that labor is starting. It can also be caused by normal growth and stretching of muscles and ligaments during pregnancy. Always tell your health care provider if you have any abdominal pain. Follow these instructions at home:  Do not have sex or put anything in your vagina until your pain goes away completely.  Get plenty of rest until your pain improves.  Drink enough fluid to keep your urine pale yellow.  Take over-the-counter and prescription medicines only as told by your health care provider.  Keep all follow-up visits as told by your health care provider. This is important. Contact a health care provider if:  Your pain continues or gets worse after resting.  You have lower abdominal pain that: ? Comes and goes at regular intervals. ? Spreads to your back. ? Is similar to menstrual cramps.  You have pain or burning when you urinate. Get help right away  if:  You have a fever or chills.  You have vaginal bleeding.  You are leaking fluid from your vagina.  You are passing tissue from your vagina.  You have vomiting or diarrhea that lasts for more than 24 hours.  Your baby is moving less than usual.  You feel very weak or faint.  You have shortness of breath.  You develop severe pain in your upper abdomen. Summary  Abdominal pain is common during pregnancy, and has many possible causes.  If you experience abdominal pain during pregnancy, tell your health care provider right away.  Follow your health care provider's home care instructions and keep all follow-up visits as directed. This information is  not intended to replace advice given to you by your health care provider. Make sure you discuss any questions you have with your health care provider. Document Released: 12/30/2004 Document Revised: 04/19/2018 Document Reviewed: 04/03/2016 Elsevier Patient Education  2020 [employer].

## 2018-10-17 NOTE — MAU Note (Signed)
Rachel Lloyd is a 33 y.o. at [redacted]w[redacted]d here in MAU reporting: here with abdominal pain, nausea, vomiting, and dizziness. Has vomited 50 times today. Also spitting. Had some spotting earlier in the week but none now.   Onset of complaint: ongoing but getting worse  Pain score: 10/10  Vitals:   10/17/18 1320  BP: 120/70  Pulse: 75  Resp: 18  Temp: 99 F (37.2 C)  SpO2: 99%      Lab orders placed from triage: UA, pt states she has not been able to pee

## 2018-10-17 NOTE — Assessment & Plan Note (Signed)
Anxiety and stress with history of bipolar disorder:patient says that she is safe and is not having any thoughts of hurting herself or anyone else.  She says that she is just been stressed out with all of this new news of pregnancy.  She says she is a single mother with 4 kids and does not have the ability to go through another pregnancy.  She does reference a couple times that "this pregnancy will kill me "but denies any significant physical symptoms.  When pushed further to clarify she is talking about the stress.  Again denies any thoughts of hurting herself.  Says she has had a therapist in the past would like to establish with someone, referral placed

## 2018-10-17 NOTE — MAU Provider Note (Signed)
Chief Complaint: Abdominal Pain and Emesis   First Provider Initiated Contact with Patient 10/17/18 1332     SUBJECTIVE HPI: Rachel Lloyd is a 33 y.o. [id number] at [redacted]w[redacted]d who presents to Maternity Admissions reporting nausea, vomiting and low abd pain x 2 days. Pos UPT. No imaging or other tests this pregnancy.   Location: Periumbilical and suprapubic Quality: sharp Severity: 8/10 on pain scale Duration: 2 days Context: early pregnancy  Timing: intermittent Modifying factors: None. Hasn't tried anything  Associated signs and symptoms: Pos for N/V. Denies fever, chills, diarrhea, urinary complaints.   Past Medical History:  Diagnosis Date  . Chlamydia   . Contraception management 05/21/2011  . Hypertension    During second pregnancy  . Mental disorder 2010   bipolar  . Pneumonia   . Trichimoniasis    OB History  Gravida Para Term Preterm AB Living  9 4 4  0 4 4  SAB TAB Ectopic Multiple Live Births  1 3 0 0 4    # Outcome Date GA Lbr Len/2nd Weight Sex Delivery Anes PTL Lv  9 Current           8 Term 05/08/16 [redacted]w[redacted]d 01:25 / 00:04 2175 g F Vag-Spont EPI  LIV  7 TAB 05/2014          6 TAB 06/25/13          5 Term 08/01/12 [redacted]w[redacted]d 05:58 / 00:15 3073 g F Vag-Spont EPI  LIV  4 Term 04/12/11 [redacted]w[redacted]d 12:00 / 04:38 3695 g M Vag-Vacuum EPI  LIV     Birth Comments: caput  3 TAB 01/30/06      None  DEC     Birth Comments: Pt reports she had a TAB at 24 weeks in [place].  Denies any fetal anomalies.    2 Term 04/22/04 [redacted]w[redacted]d  3118 g M Vag-Spont EPI N LIV  1 SAB            Past Surgical History:  Procedure Laterality Date  . INDUCED ABORTION    . MOUTH SURGERY     as a child  . WISDOM TOOTH EXTRACTION     Social History   Socioeconomic History  . Marital status: Single    Spouse name: Not on file  . Number of children: Not on file  . Years of education: Not on file  . Highest education level: Not on file  Occupational History  . Not on file  Social Needs  . Financial  resource strain: Not on file  . Food insecurity    Worry: Not on file    Inability: Not on file  . Transportation needs    Medical: Not on file    Non-medical: Not on file  Tobacco Use  . Smoking status: Former Smoker    Types: Cigarettes    Quit date: 04/04/2010    Years since quitting: 8.5  . Smokeless tobacco: Never Used  Substance and Sexual Activity  . Alcohol use: Yes    Comment: occasional  . Drug use: Yes    Types: Marijuana, Cocaine    Comment: marijuana last week  . Sexual activity: Yes  Lifestyle  . Physical activity    Days per week: Not on file    Minutes per session: Not on file  . Stress: Not on file  Relationships  . Social Herbalist on phone: Not on file    Gets together: Not on file    Attends religious service:  Not on file    Active member of club or organization: Not on file    Attends meetings of clubs or organizations: Not on file    Relationship status: Not on file  . Intimate partner violence    Fear of current or ex partner: Not on file    Emotionally abused: Not on file    Physically abused: Not on file    Forced sexual activity: Not on file  Other Topics Concern  . Not on file  Social History Narrative  . Not on file   Family History  Problem Relation Age of Onset  . Hypertension Mother   . Cancer Mother   . Fibroids Mother   . Healthy Father   . ADD / ADHD Brother   . Hypertension Maternal Grandmother   . Anesthesia problems Neg Hx    No current facility-administered medications on file prior to encounter.    Current Outpatient Medications on File Prior to Encounter  Medication Sig Dispense Refill  . Doxylamine-Pyridoxine (DICLEGIS) 10-10 MG TBEC Take 1 tablet by mouth every 6 (six) hours as needed. 120 tablet 0  . metaxalone (SKELAXIN) 800 MG tablet Take 1 tablet (800 mg total) by mouth 3 (three) times daily. 21 tablet 0  . methylPREDNISolone (MEDROL DOSEPAK) 4 MG TBPK tablet tad 21 tablet 0  . Prenatal Vit-Fe  Fumarate-FA (PRENATAL VITAMIN) 27-0.8 MG TABS Take 1 tablet by mouth daily. 90 tablet 3   Allergies  Allergen Reactions  . Penicillins Anaphylaxis    Has taken cephalosprorins Has patient had a PCN reaction causing immediate rash, facial/tongue/throat swelling, SOB or lightheadedness with hypotension: Yes Has patient had a PCN reaction causing severe rash involving mucus membranes or skin necrosis: Yes Has patient had a PCN reaction that required hospitalization Yes Has patient had a PCN reaction occurring within the last 10 years: No If all of the above answers are "NO", then may proceed with Cephalosporin use.  . Latex Itching and Rash    I have reviewed patient's Past Medical Hx, Surgical Hx, Family Hx, Social Hx, medications and allergies.   Review of Systems  Constitutional: Negative for appetite change, chills and fever.  Gastrointestinal: Positive for abdominal pain, nausea and vomiting. Negative for abdominal distention, blood in stool, constipation and diarrhea.  Genitourinary: Negative for difficulty urinating, dysuria, flank pain, frequency, hematuria, pelvic pain, vaginal bleeding, vaginal discharge and vaginal pain.  Musculoskeletal: Negative for back pain.  Neurological: Positive for dizziness.    OBJECTIVE Patient Vitals for the past 24 hrs:  BP Temp Temp src Pulse Resp SpO2  10/17/18 1320 120/70 99 F (37.2 C) Oral 75 18 99 %   Constitutional: Well-developed, well-nourished female in mild distress.  Cardiovascular: normal rate Respiratory: normal rate and effort.  GI: Abd soft, no periumbilical tenderness.  Mild suprapubic tenderness.  Fundus nonpalpable. MS: Extremities nontender, no edema, normal ROM Neurologic: Alert and oriented x 4.  GU: Neg CVAT.  PELVIC EXAM: NEFG, physiologic discharge, no blood noted, cervix closed; uterus top-normal normal size, no adnexal tenderness or masses.  No CMT.  LAB RESULTS Results for orders placed or performed during the  hospital encounter of 10/17/18 (from the past 24 hour(s))  CBC     Status: None   Collection Time: 10/17/18  1:37 PM  Result Value Ref Range   WBC 8.5 4.0 - 10.5 K/uL   RBC 4.47 3.87 - 5.11 MIL/uL   Hemoglobin 14.2 12.0 - 15.0 g/dL   HCT 51.0 25.8 - 52.7 %  MCV 89.0 80.0 - 100.0 fL   MCH 31.8 26.0 - 34.0 pg   MCHC 35.7 30.0 - 36.0 g/dL   RDW 40.911.9 81.111.5 - 91.415.5 %   Platelets 284 150 - 400 K/uL   nRBC 0.0 0.0 - 0.2 %  hCG, quantitative, pregnancy     Status: Abnormal   Collection Time: 10/17/18  1:37 PM  Result Value Ref Range   hCG, Beta Chain, Quant, S 28,188 (H) <5 mIU/mL  Wet prep, genital     Status: Abnormal   Collection Time: 10/17/18  1:51 PM  Result Value Ref Range   Yeast Wet Prep HPF POC NONE SEEN NONE SEEN   Trich, Wet Prep NONE SEEN NONE SEEN   Clue Cells Wet Prep HPF POC NONE SEEN NONE SEEN   WBC, Wet Prep HPF POC MANY (A) NONE SEEN   Sperm NONE SEEN    A positive  IMAGING [place] Ob Comp Less 14 Wks  Result Date: 10/17/2018 CLINICAL DATA:  33 year old pregnant female presents with abdominal pain. Quantitative beta HCG 28,188. EDC by LMP: 05/21/2019, projecting to an expected gestational age of [redacted] weeks 1 day. EXAM: OBSTETRIC <14 WK ULTRASOUND TECHNIQUE: Transabdominal ultrasound was performed for evaluation of the gestation as well as the maternal uterus and adnexal regions. COMPARISON:  No prior scans from this gestation. FINDINGS: Intrauterine gestational sac: Single Yolk sac:  Visualized. Embryo:  Visualized. Cardiac Activity: Visualized. Heart Rate: 105 bpm CRL:   2.5 mm   5 w 5 d                  US EDC: 06/14/2018 Subchorionic hemorrhage: Small perigestational bleed involving less than 30% of the gestational sac circumference. Maternal uterus/adnexae: No uterine fibroids demonstrated. Right ovary measures 5.5 x 4.4 x 5.0 cm and contains two simple cysts, largest 4.4 x 3.8 x 3.6 cm. Left ovary measures 3.1 x 2.0 x 1.8 cm. No suspicious ovarian or adnexal masses. No abnormal  free fluid in the pelvis. IMPRESSION: 1. Single living intrauterine gestation measuring 5 weeks 5 days by crown-rump length, discordant with the expected gestational age of [redacted] weeks 1 day by provided menstrual dating. Small perigestational bleed. Suggest follow-up obstetric scan in 4 weeks given these risk factors. 2. Simple right ovarian cysts, largest 4.4 cm, which can also be reassessed on follow-up obstetric scans. No suspicious ovarian or adnexal masses. Electronically Signed   By: Delbert PhenixJason A Poff M.D.   On: 10/17/2018 15:58    MAU COURSE Orders Placed This Encounter  Procedures  . Wet prep, genital  . US OB Comp Less 14 Wks  . Urinalysis, Routine w reflex microscopic  . CBC  . HIV Antibody (routine testing w rflx)  . hCG, quantitative, pregnancy  . Insert peripheral IV  . Discharge patient   Meds ordered this encounter  Medications  . promethazine (PHENERGAN) 25 mg in sodium chloride 0.9 % 1,000 mL infusion  . fentaNYL (SUBLIMAZE) injection 50 mcg  . DISCONTD: promethazine (PHENERGAN) 25 MG tablet    Sig: Take 1 tablet (25 mg total) by mouth every 6 (six) hours as needed for nausea or vomiting.    Dispense:  30 tablet    Refill:  3    Order Specific Question:   Supervising Provider    Answer:   [place] BingPICKENS, CHARLIE [id number]]  . glycopyrrolate (ROBINUL) 2 MG tablet    Sig: Take 1 tablet (2 mg total) by mouth 3 (three) times daily as needed.    Dispense:  30 tablet    Refill:  3    Order Specific Question:   Supervising Provider    Answer:   [place] Bing [id number]  . promethazine (PHENERGAN) 25 MG tablet    Sig: Take 1 tablet (25 mg total) by mouth every 6 (six) hours as needed for nausea or vomiting.    Dispense:  30 tablet    Refill:  3    Order Specific Question:   Supervising Provider    Answer:   [place] Bing [[id number]]    MDM - 5.5-week live single intrauterine pregnancy.  Small subchorionic hemorrhage.  Will adjust due date based on ultrasound. - 4 cm simple cyst of  right ovary possibly causing her pain.  No evidence of ectopic pregnancy, SAB in progress or other emergent condition.  Re-eval simple cyst on anatomy scan.  Rx Ultram for pain. - Nausea and vomiting improving with Phenergan.  No vomiting while in MAU. - Ptyalism.  Will send prescription for Robinul.  ASSESSMENT 1. Abdominal pain during pregnancy in first trimester   2. Hyperemesis affecting pregnancy, antepartum   3. Intrauterine pregnancy   4. Subchorionic hemorrhage of placenta in first trimester, single or unspecified fetus   5. Size of fetus inconsistent with dates in first trimester   6. Ptyalism     PLAN Discharge home in stable condition. First trimester precautions  Follow-up Information    [facility] Follow up.   Why: As scheduled or sooner as needed if symptoms worsen Contact information: [address] [place] [address] [phone]       [facility] Follow up.   Specialty: Obstetrics and Gynecology Why: As needed in pregnancy emergencies Contact information: [address] [id number] [facility] [address] [place] [address] [phone]         Allergies as of 10/17/2018      Reactions   Penicillins Anaphylaxis   Has taken cephalosprorins Has patient had a PCN reaction causing immediate rash, facial/tongue/throat swelling, SOB or lightheadedness with hypotension: Yes Has patient had a PCN reaction causing severe rash involving mucus membranes or skin necrosis: Yes Has patient had a PCN reaction that required hospitalization Yes Has patient had a PCN reaction occurring within the last 10 years: No If all of the above answers are "NO", then may proceed with Cephalosporin use.   Latex Itching, Rash      Medication List    STOP taking these medications   metaxalone 800 MG tablet Commonly known as: SKELAXIN   methylPREDNISolone 4 MG Tbpk tablet Commonly known as: MEDROL DOSEPAK     TAKE  these medications   Doxylamine-Pyridoxine 10-10 MG Tbec Commonly known as: Diclegis Take 1 tablet by mouth every 6 (six) hours as needed.   glycopyrrolate 2 MG tablet Commonly known as: ROBINUL Take 1 tablet (2 mg total) by mouth 3 (three) times daily as needed.   Prenatal Vitamin 27-0.8 MG Tabs Take 1 tablet by mouth daily.   promethazine 25 MG tablet Commonly known as: PHENERGAN Take 1 tablet (25 mg total) by mouth every 6 (six) hours as needed for nausea or vomiting.   traMADol 50 MG tablet Commonly known as: Ultram Take 1-2 tablets (50-100 mg total) by mouth every 6 (six) hours as needed for severe pain.        Katrinka Blazing, [place], CNM 10/17/2018  6:02 PM

## 2018-10-17 NOTE — Assessment & Plan Note (Signed)
G9 P4-0-4-4, patient found out unexpectedly at her last visit that she was pregnant.  This was not desired.  There was apparently miscommunication as patient says she was only coming to get her ultrasound dating because the clinic that she intends to get an abortion at told her that she has to be confirmed at least 6 weeks.  We apologize for this confusion but told her that we cannot date at this clinic.  We did order an ultrasound for her which should take place on 6 October.  She is very unsure of her date by conception but thinks it might be approximately 6 to 8 weeks ago.  She has had no pelvic pain, no bleeding, no chest pain, no shortness of breath, no concerning physical symptoms other than nausea without vomiting.  Will prescribe Diclegis.  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  She refuses any and all prenatal labs and testing beyond the dating ultrasound.  She says that she does not need a referral to a clinic for the abortion because she already has one arranged.

## 2018-10-19 ENCOUNTER — Ambulatory Visit (HOSPITAL_COMMUNITY): Payer: Medicaid Other

## 2018-10-19 LAB — GC/CHLAMYDIA PROBE AMP (~~LOC~~) NOT AT ARMC
Chlamydia: NEGATIVE
Neisseria Gonorrhea: NEGATIVE

## 2018-11-30 ENCOUNTER — Ambulatory Visit (INDEPENDENT_AMBULATORY_CARE_PROVIDER_SITE_OTHER): Payer: Medicaid Other | Admitting: Family Medicine

## 2018-11-30 ENCOUNTER — Encounter: Payer: Self-pay | Admitting: Family Medicine

## 2018-11-30 ENCOUNTER — Other Ambulatory Visit: Payer: Self-pay

## 2018-11-30 VITALS — BP 98/60 | HR 86 | Wt 133.6 lb

## 2018-11-30 DIAGNOSIS — N898 Other specified noninflammatory disorders of vagina: Secondary | ICD-10-CM

## 2018-12-04 ENCOUNTER — Encounter: Payer: Self-pay | Admitting: Family Medicine

## 2018-12-04 NOTE — Progress Notes (Signed)
Due to a large influx of patients at [facility] lab At the end of the clinic half day I discussed that we will be unable to draw lab work for the patient's complaint of vaginal discharge.  Patient expressed dissatisfaction with this stated that was the only reason she came in that she no longer wanted the visit.  Try to get patient rescheduled for next day but no clinic appointments available.  Patient states that she would go to urgent care for management.  No charge visit.  Guadalupe Dawn MD PGY-3 Family Medicine Resident

## 2018-12-14 ENCOUNTER — Other Ambulatory Visit: Payer: Self-pay

## 2018-12-14 ENCOUNTER — Other Ambulatory Visit: Payer: Medicaid Other

## 2018-12-14 ENCOUNTER — Ambulatory Visit (INDEPENDENT_AMBULATORY_CARE_PROVIDER_SITE_OTHER): Payer: Medicaid Other | Admitting: *Deleted

## 2018-12-14 DIAGNOSIS — Z111 Encounter for screening for respiratory tuberculosis: Secondary | ICD-10-CM | POA: Diagnosis not present

## 2018-12-14 NOTE — Progress Notes (Signed)
Patient is here for a PPD placement.  PPD placed in right forearm.  Patient will return 12/16/18 to have PPD read. Christen Bame, CMA

## 2018-12-15 ENCOUNTER — Ambulatory Visit: Payer: Medicaid Other | Admitting: Family Medicine

## 2018-12-16 ENCOUNTER — Ambulatory Visit: Payer: Medicaid Other

## 2018-12-16 ENCOUNTER — Other Ambulatory Visit: Payer: Self-pay

## 2018-12-16 DIAGNOSIS — Z111 Encounter for screening for respiratory tuberculosis: Secondary | ICD-10-CM

## 2018-12-16 LAB — TB SKIN TEST
Induration: 0 mm
TB Skin Test: NEGATIVE

## 2018-12-16 NOTE — Progress Notes (Signed)
PPD Reading Note  PPD read and results entered in EpicCare.  Result: 0 mm induration.  Interpretation: Negative  Allergic reaction: no

## 2019-02-16 ENCOUNTER — Other Ambulatory Visit (HOSPITAL_COMMUNITY)
Admission: RE | Admit: 2019-02-16 | Discharge: 2019-02-16 | Disposition: A | Discharge status: Home / Self Care | Payer: Medicaid Other | Source: Ambulatory Visit | Attending: Family Medicine | Admitting: Family Medicine

## 2019-02-16 ENCOUNTER — Other Ambulatory Visit: Payer: Self-pay

## 2019-02-16 ENCOUNTER — Ambulatory Visit (INDEPENDENT_AMBULATORY_CARE_PROVIDER_SITE_OTHER): Payer: Medicaid Other | Admitting: Family Medicine

## 2019-02-16 ENCOUNTER — Encounter: Payer: Self-pay | Admitting: Family Medicine

## 2019-02-16 VITALS — BP 115/80 | HR 88 | Wt 138.0 lb

## 2019-02-16 DIAGNOSIS — B9689 Other specified bacterial agents as the cause of diseases classified elsewhere: Secondary | ICD-10-CM

## 2019-02-16 DIAGNOSIS — Z0000 Encounter for general adult medical examination without abnormal findings: Secondary | ICD-10-CM

## 2019-02-16 DIAGNOSIS — Z124 Encounter for screening for malignant neoplasm of cervix: Secondary | ICD-10-CM | POA: Diagnosis not present

## 2019-02-16 DIAGNOSIS — N898 Other specified noninflammatory disorders of vagina: Secondary | ICD-10-CM

## 2019-02-16 DIAGNOSIS — A599 Trichomoniasis, unspecified: Secondary | ICD-10-CM | POA: Diagnosis not present

## 2019-02-16 DIAGNOSIS — L2389 Allergic contact dermatitis due to other agents: Secondary | ICD-10-CM

## 2019-02-16 DIAGNOSIS — N760 Acute vaginitis: Secondary | ICD-10-CM

## 2019-02-16 LAB — POCT WET PREP (WET MOUNT): Clue Cells Wet Prep Whiff POC: POSITIVE

## 2019-02-16 MED ORDER — METRONIDAZOLE 500 MG PO TABS: 500.0000 mg | ORAL_TABLET | Freq: Two times a day (BID) | ORAL | 0 refills | Status: DC

## 2019-02-16 MED ORDER — TRIAMCINOLONE ACETONIDE 0.025 % EX OINT: 1.0000 "application " | TOPICAL_OINTMENT | Freq: Two times a day (BID) | CUTANEOUS | 0 refills | Status: DC

## 2019-02-16 NOTE — Patient Instructions (Addendum)
It was great seeing you again today!  I performed a Pap smear, I will call you back in a few days with the results.  For your face rash I sent in a steroid cream.  Your wet prep came back as bv and trich. I sent in your prescription

## 2019-02-17 NOTE — Progress Notes (Signed)
   CHIEF COMPLAINT / HPI: 34 year old female who presents for pap smear and due to vaginal discharge. She states that she is "supposed to get a pap smear every year." It looks like she has not had one since 2019 and there was no abnormality noted. I explained that she is not yet due for one but patient would like pap anyways.  She also complains about a thick, white discharge that has been present for a few days. She states that she thinks it is either bv or trichomonas. She has been treated for this before and states her partner refuses to get treated.  Patient is also complaining of a mild rash. She states that she routinely get it due to her mask. It is described as very itchy, usually in the distribution of her mask.  PERTINENT  PMH / PSH: h/o trich, bv   OBJECTIVE: BP 115/80   Pulse 88   Wt 138 lb (62.6 kg)   LMP 01/16/2019 (Approximate)   SpO2 100%   Breastfeeding Unknown   BMI 25.24 kg/m   Gen: 34 year old female, no acute distress, comfortale HEENT: mild erythematous rash noted in circular distribution of patient mask CV: skin warm and dry Resp: no distress, no accessory muscle use GU: thick, white discharge noted. Very difficult to visualize cervix for pap smear. Neuro: Alert and oriented, Speech clear, No gross deficits  Wet prep: + for clue cell, trich  ASSESSMENT / PLAN:  Trichomonas infection After telling the patient her results and between when I could get her the EPT form the patient has made her way to the lobby. She told me to send it to her pharmacy "just like always". Sent in flagyl 500mg  bid for 7 days. Strongly recommended patient get her partner to come in for treatment.  Bacterial vaginosis Will send in flagyl 500mg  bid for 7 days  Healthcare maintenance Performed pap smear. Up to date on other HM items.  Contact dermatitis Appears to have contact dermatitis from her mask. I recommended getting a mask with a different fabric to use. Sent in  triamcinolone 0.025% bid for 7 days.     MD PGY-3 Family Medicine Resident [facility]

## 2019-02-18 ENCOUNTER — Encounter: Payer: Self-pay | Admitting: Family Medicine

## 2019-02-18 DIAGNOSIS — L259 Unspecified contact dermatitis, unspecified cause: Secondary | ICD-10-CM | POA: Insufficient documentation

## 2019-02-18 DIAGNOSIS — A599 Trichomoniasis, unspecified: Secondary | ICD-10-CM | POA: Insufficient documentation

## 2019-02-18 DIAGNOSIS — Z0000 Encounter for general adult medical examination without abnormal findings: Secondary | ICD-10-CM | POA: Insufficient documentation

## 2019-02-18 HISTORY — DX: Trichomoniasis, unspecified: A59.9

## 2019-02-18 LAB — CYTOLOGY - PAP
Comment: NEGATIVE
Diagnosis: NEGATIVE
High risk HPV: NEGATIVE

## 2019-02-18 NOTE — Assessment & Plan Note (Signed)
Performed pap smear. Up to date on other HM items.

## 2019-02-18 NOTE — Assessment & Plan Note (Addendum)
After telling the patient her results and between when I could get her the EPT form the patient has made her way to the lobby. She told me to send it to her pharmacy "just like always". Sent in flagyl 500mg  bid for 7 days. Strongly recommended patient get her partner to come in for treatment.

## 2019-02-18 NOTE — Assessment & Plan Note (Signed)
Appears to have contact dermatitis from her mask. I recommended getting a mask with a different fabric to use. Sent in triamcinolone 0.025% bid for 7 days.

## 2019-02-18 NOTE — Assessment & Plan Note (Signed)
Will send in flagyl 500mg  bid for 7 days

## 2019-02-24 ENCOUNTER — Telehealth: Payer: Self-pay

## 2019-02-24 NOTE — Telephone Encounter (Signed)
If the patient calls back please let her know that her pap smear came back negative.   Of note, she had declined urine pregnancy test and left without wanting to discuss the risks of flagyl on pregnancy at last visit.  Myrene Buddy MD PGY-3 Family Medicine Resident

## 2019-02-24 NOTE — Telephone Encounter (Signed)
Patient calls nurse line requesting results from recent pap. Patient also states that she has been feeling nauseous from medication prescribed and began vomiting yesterday. Patient also reports increased vaginal itching and believes she may have developed a yeast infection. Patient also states that she is about one week late on her period. Not currently using birth control.    Consulted with Preceptor (Sparacino), that advised that patient could take 4 flagyl with food to complete treatment.   Will inform patient to abstain from sexual activity for 4-6 weeks until [facility] can be performed.   Patient to return call after taking home pregnancy test.    Called patient to inform her of above. Pt does not have VM set up. Will attempt to call later.   Veronda Prude, RN

## 2019-02-25 NOTE — Telephone Encounter (Signed)
Attempted to call patient again. No answer and no ability to leave VM.  Veronda Prude, RN

## 2019-03-01 ENCOUNTER — Other Ambulatory Visit: Payer: Self-pay

## 2019-03-01 ENCOUNTER — Other Ambulatory Visit (HOSPITAL_COMMUNITY)
Admission: RE | Admit: 2019-03-01 | Discharge: 2019-03-01 | Disposition: A | Discharge status: Home / Self Care | Payer: Medicaid Other | Source: Ambulatory Visit | Attending: Family Medicine | Admitting: Family Medicine

## 2019-03-01 ENCOUNTER — Ambulatory Visit: Payer: Medicaid Other | Admitting: Family Medicine

## 2019-03-01 VITALS — BP 110/70 | HR 85 | Temp 98.2°F | Wt 135.0 lb

## 2019-03-01 DIAGNOSIS — N912 Amenorrhea, unspecified: Secondary | ICD-10-CM

## 2019-03-01 DIAGNOSIS — Z113 Encounter for screening for infections with a predominantly sexual mode of transmission: Secondary | ICD-10-CM | POA: Diagnosis not present

## 2019-03-01 DIAGNOSIS — Z3201 Encounter for pregnancy test, result positive: Secondary | ICD-10-CM

## 2019-03-01 DIAGNOSIS — N898 Other specified noninflammatory disorders of vagina: Secondary | ICD-10-CM | POA: Diagnosis not present

## 2019-03-01 DIAGNOSIS — A599 Trichomoniasis, unspecified: Secondary | ICD-10-CM | POA: Diagnosis not present

## 2019-03-01 LAB — POCT URINE PREGNANCY: Preg Test, Ur: POSITIVE — AB

## 2019-03-01 LAB — POCT WET PREP (WET MOUNT)
Clue Cells Wet Prep Whiff POC: NEGATIVE
Trichomonas Wet Prep HPF POC: ABSENT

## 2019-03-01 MED ORDER — MICONAZOLE NITRATE 100 MG VA SUPP: 100.0000 mg | Freq: Every day | VAGINAL | 0 refills | Status: DC

## 2019-03-01 NOTE — Patient Instructions (Addendum)
Please use over the counter Monistat for 7 days to treat your vaginal itching Please be sure to start taking a prenatal vitamin. Avoid alcohol and tobacco Please stop taking any medications other than your prenatal vitamin. Please discuss any meds with your PCP before starting. Avoid cat litter, unpasteurized cheeses, Ibuprofen/Nsaids, and deli meats unless full cooked.  I will call you if any of the labs are positive and send a letter otherwise. We will plan to follow up again on 03/14/19 with your PCP.  Please call if you have any questions or concerns. Take care, Dr. Mauri Reading

## 2019-03-01 NOTE — Assessment & Plan Note (Addendum)
Patient presents today with vaginal discharge and found to have positive pregnancy test. LMP on 01/20/19. She was recently underwent elective abortion in Oct. 2020, but notes she is in a better place this time around. This pregnancy was unplanned and she is unsure if it is unwanted. She is currently [id number] with 4 previous term vaginal deliveries. Patient has opted to wait on prenatal labs. She would rather discuss her options with her significant other and follow up after. Recommended she call to schedule initial prenatal visit at earliest convenience if she opts to keep pregnancy in order to obtain OB labs and dating ultrasound. She understood and agreed to plan.   Anticipatory Guidance and Prenatal Education provided on the following topics: - Reviewed nutrition in pregnancy, avoiding cat litter, unpasteurized cheeses - Recommended continuing Prenatal vitamin   - Instructed to avoid tobacco and alcohol and other illicit drugs - Med list reviewed and all inappropriate meds were discontinued.

## 2019-03-01 NOTE — Progress Notes (Signed)
Subjective:   Patient ID: Rachel Lloyd    DOB: 06-24-1985, 34 y.o. female   MRN: [id number]  Rachel Lloyd is a 34 y.o. female  here for vaginal discharge.   Vaginal Discharge: Patient notes she was on antibiotics at last week for Trich and now she has developed vaginal itching. She thinks she has yeast infection. Notes she is having thick and white discharge. Denies any vaginal bleeding. Denies any pelvic pain. Denies any fevers/chills, dyspareunia. Patient notes partner got treated for trichomonas.  Positive Pregnancy Test:  Patient noted to have positive pregnancy test. LMP 01/20/19-01/27/19, notes periods are regular, last about 7 days.  Patient was pregnant in October in which she opted for an abortion. She notes her periods were just starting be get back to regular basis. Notes this pregnancy was not planned. She currently has 4 babies. [id number] - 1 miscarriage at ~14-16 weeks, 4 abortions, 4 living children. All term vaginal deliveries. No history of c-sections. All 4 children live with patient. Denies any vaginal bleeding. Denies any pelvic pain. Denies any fevers/chills, dyspareunia.   Review of Systems:  Per HPI.   PMFSH, medications and smoking status reviewed.  Objective:   BP 110/70   Pulse 85   Temp 98.2 F (36.8 C) (Oral)   Wt 135 lb (61.2 kg)   LMP 01/20/2019   SpO2 98%   BMI 24.69 kg/m  Vitals and nursing note reviewed.  General: pleasant young  Female, well nourished, well developed, in no acute distress with non-toxic appearance Resp: Speaking in full sentences, breathing comfortably on room air Abdomen: soft, non-tender, non-distended Skin: warm, dry Extremities: warm and well perfused MSK:  gait normal Neuro: Alert and oriented, speech normal Pelvic exam: VULVA: normal appearing vulva with no masses, tenderness or lesions, VAGINA: normal appearing vagina with normal color and discharge, no lesions, CERVIX: normal appearing cervix without lesions, cervical  discharge present - creamy and green, ADNEXA: normal adnexa in size, nontender and no masses, WET MOUNT done - results: negative for pathogens, normal epithelial cells, exam chaperoned by Virl Son.  Assessment & Plan:   Positive pregnancy test Patient presents today with vaginal discharge and found to have positive pregnancy test. LMP on 01/20/19. She was recently underwent elective abortion in Oct. 2020, but notes she is in a better place this time around. This pregnancy was unplanned and she is unsure if it is unwanted. She is currently [id number] with 4 previous term vaginal deliveries. Patient has opted to wait on prenatal labs. She would rather discuss her options with her significant other and follow up after. Recommended she call to schedule initial prenatal visit at earliest convenience if she opts to keep pregnancy in order to obtain OB labs and dating ultrasound. She understood and agreed to plan.   Anticipatory Guidance and Prenatal Education provided on the following topics: - Reviewed nutrition in pregnancy, avoiding cat litter, unpasteurized cheeses - Recommended continuing Prenatal vitamin   - Instructed to avoid tobacco and alcohol and other illicit drugs - Med list reviewed and all inappropriate meds were discontinued.  Vaginal discharge Endorses vaginal discharge and itching since completion of Metronidazole for Trichomonas. Opted to empirically treat for Yeast infection with supp Miconazole 100mg  x  7 days. Wet prep obtained and negative for yeast, trich, and BV. RPR, HIV, and Hep C pending.   Trichomonas infection Per patient, partner has been treated. TOC via wet prep today negative for Tric.    Orders Placed This Encounter  Procedures  . HIV antibody (with reflex)  . RPR  . Hepatitis C Antibody  . POCT urine pregnancy  . POCT Wet Prep [facility])   Meds ordered this encounter  Medications  . miconazole (MICOTIN) 100 MG vaginal suppository    Sig: Place 1 suppository  (100 mg total) vaginally at bedtime.    Dispense:  7 suppository    Refill:  0   Mina Marble, DO PGY-2, [facility] 03/02/2019 10:13 AM

## 2019-03-02 LAB — CERVICOVAGINAL ANCILLARY ONLY
Chlamydia: NEGATIVE
Comment: NEGATIVE
Comment: NORMAL
Neisseria Gonorrhea: NEGATIVE

## 2019-03-02 LAB — HIV ANTIBODY (ROUTINE TESTING W REFLEX): HIV Screen 4th Generation wRfx: NONREACTIVE

## 2019-03-02 LAB — HEPATITIS C ANTIBODY: Hep C Virus Ab: <0.1 s/co ratio (ref 0.0–0.9)

## 2019-03-02 LAB — RPR: RPR Ser Ql: NONREACTIVE

## 2019-03-02 NOTE — Assessment & Plan Note (Signed)
Endorses vaginal discharge and itching since completion of Metronidazole for Trichomonas. Opted to empirically treat for Yeast infection with supp Miconazole 100mg  x  7 days. Wet prep obtained and negative for yeast, trich, and BV. RPR, HIV, and Hep C pending.

## 2019-03-02 NOTE — Assessment & Plan Note (Signed)
Per patient, partner has been treated. TOC via wet prep today negative for Tric.

## 2019-03-04 ENCOUNTER — Telehealth: Payer: Self-pay | Admitting: *Deleted

## 2019-03-04 NOTE — Telephone Encounter (Signed)
Pt informed and jacke scheduled her new ob appt. Roya Gieselman Bruna Potter, CMA

## 2019-03-04 NOTE — Telephone Encounter (Signed)
-----   Message from Joana Reamer, [place] sent at 03/02/2019 10:20 AM EST ----- Regarding: Initial OB visit Please inform patient that I have canceled her appointment scheduled for 3/1 as her test of cure for her previous infection was negative. She will need to make an initial prenatal visit as soon as possible once she determines if she would like to continue with the pregnancy. If she has any questions she may contact me.

## 2019-03-08 ENCOUNTER — Ambulatory Visit (INDEPENDENT_AMBULATORY_CARE_PROVIDER_SITE_OTHER): Payer: Medicaid Other | Admitting: Family Medicine

## 2019-03-08 ENCOUNTER — Telehealth: Payer: Self-pay

## 2019-03-08 ENCOUNTER — Other Ambulatory Visit: Payer: Self-pay

## 2019-03-08 VITALS — BP 120/68 | HR 85 | Wt 136.6 lb

## 2019-03-08 DIAGNOSIS — L0291 Cutaneous abscess, unspecified: Secondary | ICD-10-CM | POA: Insufficient documentation

## 2019-03-08 NOTE — Telephone Encounter (Signed)
Patient calls nurse line regarding increased pain following I&D procedure this morning. Patient reports that pain is radiating down knee and into foot. Patient declines redness, streaking, fever, or any other signs of infection.   Consulted with Dr. Frances Furbish that advised that patient could take up to 4grams of tylenol in 24 hours for pain and could also use cold therapy for inflammation and pain.   Informed patient of instructions. Patient verbalized having extra strength tylenol (500mg  capsules). Advised patient that she would need to wait at least 6 hours if taking extra strength capsules, equaling 1g every 6 hours. Not to exceed 4 grams in 24 hours. Informed patient that if she is still concerned and if there is no improvement or worsening of symptoms to call office back for follow up.  Pt verbalizes understanding  , RN

## 2019-03-08 NOTE — Assessment & Plan Note (Signed)
Patient experienced immediate relief after incision and drainage.  Conducted shared decision making with the patient and decided to forego antibiotics at this time since that she has anaphylaxis to penicillins and is currently in her first trimester of pregnancy.  She will watch the area closely and let us know if she notices increased erythema, swelling, pain, or experiences fever or chills.

## 2019-03-08 NOTE — Patient Instructions (Signed)
It was nice meeting you today Ms. Rachel Lloyd!  Today we addressed the abscess on antibiotic your right leg.  If you develop redness around the area, fevers, chills, or increased swelling and pain, please let us know to make an appointment if symptoms possible.  Will be important that you wash this area closely since we have decided to forego antibiotics today.  I hope that you feel better soon.  If you have any questions or concerns, please feel free to call the clinic.   Be well,  Dr. Frances Furbish

## 2019-03-08 NOTE — Telephone Encounter (Signed)
Patient calls nurse line regarding abscess between legs. Patient states that abscess has been draining puss and blood since this morning. Patient reports that she has not noticed odor. Patient reports redness and swelling.   Scheduled access to care this morning for evaluation.  To PCP  Veronda Prude, RN

## 2019-03-08 NOTE — Progress Notes (Signed)
    SUBJECTIVE:   CHIEF COMPLAINT / HPI:   R thigh abscess Believes it was due to an ingrown hair since the area is close to her groin and she has shaved her pubic area recently Became very swollen yesterday She squeezed the area last night and expressed some pus and blood, but she felt that it recollected and worsened overnight Soaked in a tub overnight to try to reduce pain and swelling Has also tried heating pads and tea tree oil to reduce pain  Denies fever, chills Has never occurred before Is currently in the first trimester of pregnancy  PERTINENT  PMH / PSH: Bipolar disorder, first trimester of pregnancy  OBJECTIVE:   BP 120/68   Pulse 85   Wt 136 lb 9.6 oz (62 kg)   LMP 01/20/2019   SpO2 98%   BMI 24.98 kg/m   General: well appearing, appears stated age Cardiac: RRR, no MRG Respiratory: CTAB, no rhonchi, rales, or wheezing, normal work of breathing Skin: Swollen, erythematous area on inside of right thigh near groin with fluctuance on palpation, about 1.5 cm in diameter, no streaking erythema surrounding the area Psych: appropriate mood and affect    ASSESSMENT/PLAN:   Abscess Patient experienced immediate relief after incision and drainage.  Conducted shared decision making with the patient and decided to forego antibiotics at this time since that she has anaphylaxis to penicillins and is currently in her first trimester of pregnancy.  She will watch the area closely and let us know if she notices increased erythema, swelling, pain, or experiences fever or chills.   Incision and Drainage Procedure After informed consent was obtained, the site was cleaned with iodine and anesthetized with 1% lidocaine with epinephrine. A linear incision along the local skin lines was made and the purulent material was expressed. The abscess was explored thoroughly and sequestered pockets were opened. Bleeding was minimal.  Patient tolerated the procedure well.     Lennox Solders,  MD [facility]

## 2019-03-09 ENCOUNTER — Encounter (HOSPITAL_COMMUNITY): Payer: Self-pay | Admitting: Obstetrics and Gynecology

## 2019-03-09 ENCOUNTER — Telehealth (INDEPENDENT_AMBULATORY_CARE_PROVIDER_SITE_OTHER): Payer: Medicaid Other | Admitting: Family Medicine

## 2019-03-09 ENCOUNTER — Telehealth: Payer: Self-pay

## 2019-03-09 ENCOUNTER — Inpatient Hospital Stay (HOSPITAL_COMMUNITY)
Admission: AD | Admit: 2019-03-09 | Discharge: 2019-03-09 | Disposition: A | Discharge status: Home / Self Care | Payer: Medicaid Other | Attending: Obstetrics and Gynecology | Admitting: Obstetrics and Gynecology

## 2019-03-09 ENCOUNTER — Other Ambulatory Visit: Payer: Self-pay

## 2019-03-09 DIAGNOSIS — Z87891 Personal history of nicotine dependence: Secondary | ICD-10-CM | POA: Diagnosis not present

## 2019-03-09 DIAGNOSIS — L03115 Cellulitis of right lower limb: Secondary | ICD-10-CM | POA: Diagnosis not present

## 2019-03-09 DIAGNOSIS — R5082 Postprocedural fever: Secondary | ICD-10-CM

## 2019-03-09 DIAGNOSIS — O26891 Other specified pregnancy related conditions, first trimester: Secondary | ICD-10-CM

## 2019-03-09 DIAGNOSIS — Z8249 Family history of ischemic heart disease and other diseases of the circulatory system: Secondary | ICD-10-CM | POA: Diagnosis not present

## 2019-03-09 DIAGNOSIS — M79604 Pain in right leg: Secondary | ICD-10-CM | POA: Diagnosis present

## 2019-03-09 DIAGNOSIS — R112 Nausea with vomiting, unspecified: Secondary | ICD-10-CM | POA: Diagnosis present

## 2019-03-09 DIAGNOSIS — Z3A01 Less than 8 weeks gestation of pregnancy: Secondary | ICD-10-CM | POA: Diagnosis not present

## 2019-03-09 DIAGNOSIS — Z880 Allergy status to penicillin: Secondary | ICD-10-CM | POA: Insufficient documentation

## 2019-03-09 DIAGNOSIS — O99711 Diseases of the skin and subcutaneous tissue complicating pregnancy, first trimester: Secondary | ICD-10-CM | POA: Diagnosis not present

## 2019-03-09 DIAGNOSIS — O219 Vomiting of pregnancy, unspecified: Secondary | ICD-10-CM | POA: Diagnosis not present

## 2019-03-09 DIAGNOSIS — Z91040 Latex allergy status: Secondary | ICD-10-CM | POA: Insufficient documentation

## 2019-03-09 MED ORDER — CLINDAMYCIN HCL 150 MG PO CAPS: 450.0000 mg | ORAL_CAPSULE | Freq: Three times a day (TID) | ORAL | 0 refills | Status: DC

## 2019-03-09 MED ORDER — PROMETHAZINE HCL 25 MG PO TABS: 25.0000 mg | ORAL_TABLET | Freq: Four times a day (QID) | ORAL | 0 refills | Status: DC | PRN

## 2019-03-09 MED ORDER — TRAMADOL HCL 50 MG PO TABS: 50.0000 mg | ORAL_TABLET | Freq: Four times a day (QID) | ORAL | 0 refills | Status: DC | PRN

## 2019-03-09 MED ORDER — PROMETHAZINE HCL 25 MG/ML IJ SOLN
25.0000 mg | Freq: Once | INTRAMUSCULAR | Status: AC
  Administered 2019-03-09: 25 mg via INTRAMUSCULAR
  Filled 2019-03-09: qty 1

## 2019-03-09 NOTE — MAU Note (Addendum)
.   Rachel Lloyd is a 34 y.o. at [redacted]w[redacted]d here in MAU reporting: she has pain on her inner groin, pt states she had a boil lanced yesterday at [facility] LMP: 01/27/19 Onset of complaint: yesterday Pain score: 10 Vitals:   03/09/19 1602  BP: 119/71  Pulse: (!) 107  Resp: 16  Temp: 99.2 F (37.3 C)     FHT: Lab orders placed from triage: [facility]

## 2019-03-09 NOTE — Telephone Encounter (Signed)
Called patient again, she did answer. Patient stated she feels very feverish, however she has not taken her temp. Patient stated my head and both legs are "killing me, I could barely roll over to answer the phone." Patient stated she has been nauseous and over all feels horrible. Patient stated she felt fine after I&D yesterday. These sxs did not "hit her" until last night. Scheduled a virtual due to unable to drive. I asked patient if there was anyone she could call to help her. Patient stated, "my mom is at an apt today."

## 2019-03-09 NOTE — Assessment & Plan Note (Signed)
Concern for developing systemic infection in [redacted]wk pregnant patient no s/p I/D of groin abscess with PO intolerance, report of spreading "bruise" and subjective fever symptoms  Patient agreed to call for transport to ED immediately

## 2019-03-09 NOTE — Progress Notes (Signed)
[facility] Telemedicine Visit  Patient consented to have virtual visit. Method of visit: Telephone  Encounter participants: Patient: Rachel Lloyd - located at home Provider: Marthenia Rolling - located at [facility] clinc  Chief Complaint: fever and intolerance to PO s/p abscess I/D in a pregnant patient  HPI:  Pregnant patient who was in clinic yesterday for what appeared to a well tolerated I/D for a I/D in the groin area no calls in c/o severe and worsening leg pain since the procedure.  She denies paralysis/paresthesia but says it hurts so bad she has to use her hands to move her leg and can't tolerate to move it using hip flexion.  Also says the area of "redness" around her wound is increasing and she says it looks like a spreading bruise.  Also c/o subjective fever and chills and body aches all over (does not have thermometer).  Has not been able to tolerate PO today and has been throwing up, tried sipping gatorade but wasn't able to keep it down.  She is unable to come to clinic for evaluation as she is the only adult in the house and doesn't feel she is physically capable of driving.  ROS: per HPI  Pertinent PMHx: [redacted]wk pregnant, [id number]  Exam:  On phone: sounds tired but is speaking clearly in full sentences, able to process inofrmation/questions appropriately.  Assessment/Plan:  Post-procedural fever Concern for developing systemic infection in [redacted]wk pregnant patient no s/p I/D of groin abscess with PO intolerance, report of spreading "bruise" and subjective fever symptoms  Patient agreed to call for transport to ED immediately    Time spent during visit with patient: 6 minutes

## 2019-03-09 NOTE — Telephone Encounter (Signed)
Patient LVM on nurse line stating she is in pain and feverish after yesterdays I&D. Attempted to call patient to schedule this afternoon, as we have a few spots, or to go directly to ED. Patient did not answer. No option for VM.

## 2019-03-09 NOTE — MAU Provider Note (Signed)
History     CSN: [id number]  Arrival date and time: 03/09/19 1536   First Provider Initiated Contact with Patient 03/09/19 1611      Chief Complaint  Patient presents with  . Nausea  . right leg pain   HPI  Ms.  Rachel Lloyd is a 34 y.o. year old [phone] female at [redacted]w[redacted]d weeks gestation who presents to MAU reporting severe pain in her inner RT upper thigh, increasing redness around the wound "like a spreading bruise," and possible fever and chills today but she has not thermometer. She had an abscess lanced yesterday (03/08/2019) at the [facility]. She had a virtual visit with [place] today with same complaints and was advised to come into the office, but she did not have transportation to get there. She was then advised to go to ED for evaluation. She reports N/V started today. She reports that Tylenol "helps a little with the pain."   Past Medical History:  Diagnosis Date  . Chlamydia   . Contraception management 05/21/2011  . Hypertension    During second pregnancy  . Mental disorder 2010   bipolar  . Pneumonia   . Trichimoniasis     Past Surgical History:  Procedure Laterality Date  . INDUCED ABORTION    . MOUTH SURGERY     as a child  . WISDOM TOOTH EXTRACTION      Family History  Problem Relation Age of Onset  . Hypertension Mother   . Cancer Mother   . Fibroids Mother   . Healthy Father   . ADD / ADHD Brother   . Hypertension Maternal Grandmother   . Anesthesia problems Neg Hx     Social History   Tobacco Use  . Smoking status: Former Smoker    Types: Cigarettes    Quit date: 04/04/2010    Years since quitting: 8.9  . Smokeless tobacco: Never Used  Substance Use Topics  . Alcohol use: Yes    Comment: occasional  . Drug use: Yes    Types: Marijuana, Cocaine    Comment: marijuana last week    Allergies:  Allergies  Allergen Reactions  . Penicillins Anaphylaxis    Has taken cephalosprorins Has patient had a PCN reaction causing immediate rash,  facial/tongue/throat swelling, SOB or lightheadedness with hypotension: Yes Has patient had a PCN reaction causing severe rash involving mucus membranes or skin necrosis: Yes Has patient had a PCN reaction that required hospitalization Yes Has patient had a PCN reaction occurring within the last 10 years: No If all of the above answers are "NO", then may proceed with Cephalosporin use.  . Latex Itching and Rash    Medications Prior to Admission  Medication Sig Dispense Refill Last Dose  . miconazole (MICOTIN) 100 MG vaginal suppository Place 1 suppository (100 mg total) vaginally at bedtime. 7 suppository 0   . Prenatal Vit-Fe Fumarate-FA (PRENATAL VITAMIN) 27-0.8 MG TABS Take 1 tablet by mouth daily. 90 tablet 3     Review of Systems  Constitutional: Negative.   HENT: Negative.   Eyes: Negative.   Respiratory: Negative.   Cardiovascular: Negative.   Gastrointestinal: Positive for nausea and vomiting.  Endocrine: Negative.   Genitourinary: Negative.   Musculoskeletal: Negative.   Skin: Positive for wound (pain in upper RT thigh).  Allergic/Immunologic: Negative.   Neurological: Negative.   Hematological: Negative.   Psychiatric/Behavioral: Negative.    Physical Exam   Blood pressure 119/71, pulse (!) 107, temperature 99.2 F (37.3 C), resp. rate 16,  height 4\' 11"  (1.499 m), weight 61.7 kg, last menstrual period 01/27/2019.  Physical Exam  Nursing note and vitals reviewed. Constitutional: She appears well-developed and well-nourished.  HENT:  Head: Normocephalic and atraumatic.  Eyes: Pupils are equal, round, and reactive to light.  Cardiovascular: Normal rate.  Respiratory: Effort normal.  GI: Soft.  Genitourinary:    Genitourinary Comments: Not indicated   Musculoskeletal:        General: Normal range of motion.     Cervical back: Normal range of motion.  Neurological: She is alert.  Skin: Skin is warm and dry.  Psychiatric: She has a normal mood and affect. Her  behavior is normal. Judgment and thought content normal.    MAU Course  Procedures  MDM Phenergan 25 mg IM injection -- N/V improved   *Consult with Dr. 01/29/2019 @ [id number] - notified of patient's complaints and assessments, Dr. [id number] came to evaluate patient @ 1718 - recommended tx plan Rx for Clindamycin 450 mg TID x 10 days, Tramadol 50 mg QID and Phenergan 25 mg every 6 hrs before taking abx as well as PRN - ok to d/c home and F/U with [facility] on Friday 03/11/2019  Assessment and Plan  1) Cellulitis of right thigh  - Information provided on cellulitis  2) Intractable vomiting with nausea, unspecified vomiting type  - Information provided on N/V   - Discharge patient - Call [facility] on 03/10/2019 to schedule F/U appt for 03/11/2019 - Patient verbalized an understanding of the plan of care and agrees.  Allergies as of 03/09/2019      Reactions   Penicillins Anaphylaxis   Has taken cephalosprorins Has patient had a PCN reaction causing immediate rash, facial/tongue/throat swelling, SOB or lightheadedness with hypotension: Yes Has patient had a PCN reaction causing severe rash involving mucus membranes or skin necrosis: Yes Has patient had a PCN reaction that required hospitalization Yes Has patient had a PCN reaction occurring within the last 10 years: No If all of the above answers are "NO", then may proceed with Cephalosporin use.   Latex Itching, Rash      Medication List    TAKE these medications   clindamycin 150 MG capsule Commonly known as: CLEOCIN Take 3 capsules (450 mg total) by mouth 3 (three) times daily.   miconazole 100 MG vaginal suppository Commonly known as: MICOTIN Place 1 suppository (100 mg total) vaginally at bedtime.   Prenatal Vitamin 27-0.8 MG Tabs Take 1 tablet by mouth daily.   promethazine 25 MG tablet Commonly known as: PHENERGAN Take 1 tablet (25 mg total) by mouth every 6 (six) hours as needed for nausea or vomiting. May insert vaginally, if unable  to keep anything down   traMADol 50 MG tablet Commonly known as: ULTRAM Take 1 tablet (50 mg total) by mouth every 6 (six) hours as needed for moderate pain.        03/11/2019, MSN, CNM 03/09/2019, 5:16 PM

## 2019-03-14 ENCOUNTER — Ambulatory Visit: Payer: Medicaid Other

## 2019-03-14 ENCOUNTER — Ambulatory Visit: Payer: Medicaid Other | Admitting: Family Medicine

## 2019-03-14 NOTE — Progress Notes (Signed)
SUBJECTIVE:   CHIEF COMPLAINT / HPI:   Cellulitis Recently evaluated on 2/23 for right thigh abscess.  Patient had incision and drainage performed and it was decided to forego antibiotics due to anaphylaxis to penicillins and currently in first trimester.  Area seem to worsen in telephone visit on 2/24 recommended patient go to the ED.  In the ED patient was diagnosed with cellulitis and Rx for clindamycin x10 days as well as tramadol given.  Patient reports the area is enlarging. States that she can hardly walk 2/2 pain. Notes she has to walk with a limp now. States that she is unsure if the erythema is spreading but states the "hardness" is getting larger. States that she has been compliant on clindamycin for the most part but does note she had to cut down on doses/day due to abdominal pain and nausea caused by antibiotic. Patient states the area continues to drain brown/mucous drainage. Is able to urinate and have bowel movements without pain. Does not think pain is in her vagina but does note it is getting closer to the area  Elevated PHQ9 Patient with elevated PHQ2 of 6 so reflex PHQ9 was given. PHQ9 was elevated to 27 with positive answer for question 9. Patient states she has no plan to commit suicide or harm herself. When asked about thoughts of harming self patient states "sometimes I wish I could just go to sleep and not wake up".  Reiterated multiple times that she does not have a plan to harm herself.  Patient states she feels like secondary to her recent pregnancy.  States she just had a child last year did not want to get pregnant again.  States she was finally getting her life on track and thinks this may have ruined it.  States she has no support system anymore.  Her brother was recently murdered and her sister also recently passed away.  They were her entire support system.  She feels very alone.  She is also very stressed because she knows she has medical problems now that caused  her to have difficulty taking care of her children secondary to pain.  She is very tearful in the room.  Of note, patient has a history of bipolar disorder as well as major depressive disorder without psychosis and is not on any medications.  Depression screen [facility] 2/9 03/15/2019 03/15/2019 03/08/2019 03/01/2019 02/16/2019  Decreased Interest 3 3 0 0 2  Down, Depressed, Hopeless 3 3 2 2 2   PHQ - 2 Score 6 6 2 2 4   Altered sleeping 3 - - - -  Tired, decreased energy 3 - - - -  Change in appetite 3 - - - -  Feeling bad or failure about yourself  3 - - - -  Trouble concentrating 3 - - - -  Moving slowly or fidgety/restless 3 - - - -  Suicidal thoughts 3 - - - -  PHQ-9 Score 27 - - - -  Difficult doing work/chores - - - - -  Some recent data might be hidden     PERTINENT  PMH / PSH: pregnancy in first trimester, bipolar disorder, MDD, cellulitis  OBJECTIVE:   BP 110/78   Pulse 79   Temp 98.6 F (37 C) (Oral)   Wt 134 lb (60.8 kg)   LMP 01/27/2019   SpO2 99%   BMI 27.06 kg/m   Gen: walking with limp, tearful Skin: area of erythema on upper right thigh measuring 19cm in length,  small area of fluctuance on outer corner. Very tender and firm on inner right thigh. No erythema around vaginal opening.  Psych: depressed mood, tearful  Exam performed with chaperone and also performed by Dr. Manson Passey  ASSESSMENT/PLAN:   Cellulitis of right thigh Area appears to be worsening.  Images from [facility] measuring 17 cm in diameter however today it is 19 cm.  Area appears to be more painful as well.  Does seem to have taking most of her clindamycin.  Given that area is worsening she may need reevaluation in the emergency department and possible IV antibiotics versus surgical I&D.  Discussed these options with patient.  She is agreeable to go to the emergency department.  I discussed with charge nurse in the emergency department to inform them of patient's arrival.  RN Dahlia Client has wheeled patient directly to the  emergency department triage area.  Patient discussed with Dr. Manson Passey who also perform physical exam and agrees with plan.  MDD (major depressive disorder), recurrent severe, without psychosis Patient with worsening PHQ-9 score of 27 with positive answer to #9.  Discussed with both Dr. Manson Passey as well as Sammuel Hines.  Patient was able to be evaluated by Sammuel Hines here in the clinic.  Was given multiple resources including pregnancy supports system as well as [facility].  Patient will call [facility] center in the morning to schedule an appointment.  Sammuel Hines will follow up with patient in 2 days.  No active plan at this time.  Patient also has a follow-up appointment on 3/4 at [facility] so we can follow this up then as well.  Patient is also going to the ED so can be evaluated by behavioral health there as well.  I informed ED charge nurse of the PHQ-9 score as well as positive answer to #9. AVS with crisis information given as well.     Patient discussed and evaluated by both Dr. Manson Passey and Sammuel Hines during visit encounter  This note is not being shared with the patient for the following reason: To prevent harm (release of this note would result in harm to the life or physical safety of the patient or another).  Oralia Manis, DO  PGY-3 [facility]

## 2019-03-15 ENCOUNTER — Other Ambulatory Visit: Payer: Self-pay

## 2019-03-15 ENCOUNTER — Ambulatory Visit: Payer: Medicaid Other | Admitting: Licensed Clinical Social Worker

## 2019-03-15 ENCOUNTER — Ambulatory Visit (INDEPENDENT_AMBULATORY_CARE_PROVIDER_SITE_OTHER): Payer: Medicaid Other | Admitting: Family Medicine

## 2019-03-15 ENCOUNTER — Emergency Department (HOSPITAL_COMMUNITY)
Admission: EM | Admit: 2019-03-15 | Discharge: 2019-03-15 | Disposition: A | Discharge status: Home / Self Care | Payer: Medicaid Other | Attending: Emergency Medicine | Admitting: Emergency Medicine

## 2019-03-15 ENCOUNTER — Encounter (HOSPITAL_COMMUNITY): Payer: Self-pay

## 2019-03-15 DIAGNOSIS — Z79899 Other long term (current) drug therapy: Secondary | ICD-10-CM | POA: Insufficient documentation

## 2019-03-15 DIAGNOSIS — R2241 Localized swelling, mass and lump, right lower limb: Secondary | ICD-10-CM | POA: Diagnosis present

## 2019-03-15 DIAGNOSIS — F332 Major depressive disorder, recurrent severe without psychotic features: Secondary | ICD-10-CM | POA: Diagnosis not present

## 2019-03-15 DIAGNOSIS — F439 Reaction to severe stress, unspecified: Secondary | ICD-10-CM

## 2019-03-15 DIAGNOSIS — Z3A00 Weeks of gestation of pregnancy not specified: Secondary | ICD-10-CM | POA: Diagnosis not present

## 2019-03-15 DIAGNOSIS — L03115 Cellulitis of right lower limb: Secondary | ICD-10-CM

## 2019-03-15 DIAGNOSIS — F314 Bipolar disorder, current episode depressed, severe, without psychotic features: Secondary | ICD-10-CM

## 2019-03-15 DIAGNOSIS — Z87891 Personal history of nicotine dependence: Secondary | ICD-10-CM | POA: Diagnosis not present

## 2019-03-15 DIAGNOSIS — O99711 Diseases of the skin and subcutaneous tissue complicating pregnancy, first trimester: Secondary | ICD-10-CM | POA: Diagnosis not present

## 2019-03-15 DIAGNOSIS — I10 Essential (primary) hypertension: Secondary | ICD-10-CM | POA: Diagnosis not present

## 2019-03-15 DIAGNOSIS — L0390 Cellulitis, unspecified: Secondary | ICD-10-CM | POA: Insufficient documentation

## 2019-03-15 LAB — COMPREHENSIVE METABOLIC PANEL
ALT: 46 U/L — ABNORMAL HIGH (ref 0–44)
AST: 20 U/L (ref 15–41)
Albumin: 3.5 g/dL (ref 3.5–5.0)
Alkaline Phosphatase: 109 U/L (ref 38–126)
Anion gap: 9 (ref 5–15)
BUN: 8 mg/dL (ref 6–20)
CO2: 23 mmol/L (ref 22–32)
Calcium: 9.1 mg/dL (ref 8.9–10.3)
Chloride: 100 mmol/L (ref 98–111)
Creatinine, Ser: 0.63 mg/dL (ref 0.44–1.00)
GFR calc Af Amer: >60 mL/min (ref >60)
GFR calc non Af Amer: >60 mL/min (ref >60)
Glucose, Bld: 96 mg/dL (ref 70–99)
Potassium: 3.7 mmol/L (ref 3.5–5.1)
Sodium: 132 mmol/L — ABNORMAL LOW (ref 135–145)
Total Bilirubin: 0.3 mg/dL (ref 0.3–1.2)
Total Protein: 6.8 g/dL (ref 6.5–8.1)

## 2019-03-15 LAB — CBC WITH DIFFERENTIAL/PLATELET
Abs Immature Granulocytes: 0.11 10*3/uL — ABNORMAL HIGH (ref 0.00–0.07)
Basophils Absolute: 0.1 10*3/uL (ref 0.0–0.1)
Basophils Relative: 1 %
Eosinophils Absolute: 0.1 10*3/uL (ref 0.0–0.5)
Eosinophils Relative: 1 %
HCT: 39.4 % (ref 36.0–46.0)
Hemoglobin: 13.3 g/dL (ref 12.0–15.0)
Immature Granulocytes: 2 %
Lymphocytes Relative: 25 %
Lymphs Abs: 1.8 10*3/uL (ref 0.7–4.0)
MCH: 30.5 pg (ref 26.0–34.0)
MCHC: 33.8 g/dL (ref 30.0–36.0)
MCV: 90.4 fL (ref 80.0–100.0)
Monocytes Absolute: 0.6 10*3/uL (ref 0.1–1.0)
Monocytes Relative: 8 %
Neutro Abs: 4.5 10*3/uL (ref 1.7–7.7)
Neutrophils Relative %: 63 %
Platelets: 333 10*3/uL (ref 150–400)
RBC: 4.36 MIL/uL (ref 3.87–5.11)
RDW: 11.8 % (ref 11.5–15.5)
WBC: 7.1 10*3/uL (ref 4.0–10.5)
nRBC: 0 % (ref 0.0–0.2)

## 2019-03-15 LAB — HCG, QUANTITATIVE, PREGNANCY: hCG, Beta Chain, Quant, S: 46367 m[IU]/mL — ABNORMAL HIGH (ref <5)

## 2019-03-15 LAB — LACTIC ACID, PLASMA: Lactic Acid, Venous: 0.8 mmol/L (ref 0.5–1.9)

## 2019-03-15 MED ORDER — SODIUM CHLORIDE 0.9 % IV BOLUS
1000.0000 mL | Freq: Once | INTRAVENOUS | Status: AC
  Administered 2019-03-15: 1000 mL via INTRAVENOUS

## 2019-03-15 MED ORDER — SODIUM CHLORIDE 0.9% FLUSH: 3.0000 mL | Freq: Once | INTRAVENOUS | Status: DC

## 2019-03-15 MED ORDER — METOCLOPRAMIDE HCL 10 MG PO TABS: 10.0000 mg | ORAL_TABLET | Freq: Three times a day (TID) | ORAL | 1 refills | Status: DC

## 2019-03-15 MED ORDER — ONDANSETRON HCL 4 MG/2ML IJ SOLN
4.0000 mg | Freq: Once | INTRAMUSCULAR | Status: AC
  Administered 2019-03-15: 11:00:00 4 mg via INTRAVENOUS
  Filled 2019-03-15: qty 2

## 2019-03-15 MED ORDER — CLINDAMYCIN PHOSPHATE 600 MG/50ML IV SOLN
600.0000 mg | Freq: Once | INTRAVENOUS | Status: AC
  Administered 2019-03-15: 600 mg via INTRAVENOUS
  Filled 2019-03-15: qty 50

## 2019-03-15 MED ORDER — CEPHALEXIN 500 MG PO CAPS: 500.0000 mg | ORAL_CAPSULE | Freq: Four times a day (QID) | ORAL | 0 refills | Status: AC

## 2019-03-15 NOTE — ED Provider Notes (Signed)
   Ultrasound ED Soft Tissue  Date/Time: 03/15/2019 1:30 PM Performed by: Anselm Pancoast, PA-C Authorized by: Anselm Pancoast, PA-C   Procedure details:    Indications: localization of abscess and evaluate for cellulitis     Transverse view:  Visualized   Longitudinal view:  Visualized   Images: archived   Location:    Location: lower extremity     Side:  Right Findings:     no abscess present    cellulitis present     Concepcion Living 03/15/19 1340    Raeford Razor, MD 03/16/19 [phone]

## 2019-03-15 NOTE — Assessment & Plan Note (Signed)
Area appears to be worsening.  Images from [facility] measuring 17 cm in diameter however today it is 19 cm.  Area appears to be more painful as well.  Does seem to have taking most of her clindamycin.  Given that area is worsening she may need reevaluation in the emergency department and possible IV antibiotics versus surgical I&D.  Discussed these options with patient.  She is agreeable to go to the emergency department.  I discussed with charge nurse in the emergency department to inform them of patient's arrival.  RN Dahlia Client has wheeled patient directly to the emergency department triage area.  Patient discussed with Dr. Manson Passey who also perform physical exam and agrees with plan.

## 2019-03-15 NOTE — Patient Instructions (Signed)
Licensed Clinical Social Worker Visit Information Rachel Lloyd  it was nice speaking with you. Please call me directly if you have questions [phone] Goals we discussed today:  Goals Addressed            This Visit's Progress   . Community resources       CARE PLAN ENTRY (see longtitudinal plan of care for additional care plan information)  Current Barriers:  . Patient with Depression, Bipolar Disorder, and Pregnant  needs community resources,  . Acknowledges deficits and needs support, education and care coordination in order to meet this unmet need  . Limited social support  Clinical Goal(s)  . Over the next 30 days, patient will follow up with [facility]  Interventions provided by LCSW:  . Assessed patient's care coordination needs  . Advised patient to [facility] and explained services they provide . Collaborated with PCP regarding patient needs Patient Self Care Activities & Deficits:  . Patient is unable to independently navigate community resource options without care coordination support  . Acknowledges deficits and is motivated to resolve concern  . Patient is able to contact agencies as discussed today Initial goal documentation      . On going counseling       CARE PLAN ENTRY (see longtitudinal plan of care for additional care plan information)  Current Barriers:  . Patient with Depression, Bipolar Disorder, and [place]  Acknowledges deficits with connecting to mental health provider for ongoing counseling.  . Patient is experiencing symptoms of depression which seem to be exacerbated by pregnancy and limited support system.     . Patient had negative experience with a counselor in the past Clinical Social Work Goal(s):  Marland Kitchen Over the next 30 days, patient will work with SW by telephone to reduce or manage symptoms of depression until connected for ongoing counseling resources.  Interventions:  . Assessed patient's  understanding, education, previous treatment  . Patient interviewed and appropriate assessments performed:  . Provided basic mental health support, education and interventions  . Collaborated with PCP regarding patient needs . Discussed several options for long term counseling based on need and insurance. Assisted patient with narrowing the options down to [employer] [phone]  ) . Other interventions include: Multimedia programmer provided . EMMI education on Relaxed breathing and Relieving stress.  Patient Self Care Activities & Deficits:  . Patient is unable to independently navigate community resource options without care coordination support . Patient is able to implement clinical interventions discussed today and is motivated for treatment  . Patient will call [facility] [phone] to schedule an appointment  Initial goal documentation       Materials provided: Yes: EMMI education  Rachel Lloyd received Care Management services today:  1. Care Management services include personalized support from designated clinical staff supervised by her physician, including individualized plan of care and coordination with other care providers 2. 24/7 contact [phone] for assistance for urgent and routine care needs. 3. Care Management are voluntary services and be declined at any time by calling the office.  Patient  verbally agreed to assistance and services provided by embedded care coordination/care management team today.   Patient verbalizes understanding of instructions provided today.  Follow up plan:  SW will follow up with patient by phone over the next 2 days  [name], LCSW

## 2019-03-15 NOTE — Discharge Instructions (Addendum)
Recheck at family practice Thursday as scheduled

## 2019-03-15 NOTE — ED Triage Notes (Signed)
Pt has abscess to right inner thigh that has gotten worse over the past 3 days. Pt has been taking clindamycin since Friday but has unable to take since yesterday due to nausea. Pt is pregnant but unsure how many weeks. Redness and warmth noted to the area that is spreading toward her vagina.

## 2019-03-15 NOTE — Assessment & Plan Note (Addendum)
Patient with worsening PHQ-9 score of 27 with positive answer to #9.  Discussed with both Dr. Manson Passey as well as Sammuel Hines.  Patient was able to be evaluated by Sammuel Hines here in the clinic.  Was given multiple resources including pregnancy supports system as well as [facility] resources.  Patient will call [facility] center in the morning to schedule an appointment.  Sammuel Hines will follow up with patient in 2 days.  No active plan at this time.  Patient also has a follow-up appointment on 3/4 at [facility] so we can follow this up then as well.  Patient is also going to the ED so can be evaluated by behavioral health there as well.  I informed ED charge nurse of the PHQ-9 score as well as positive answer to #9. AVS with crisis information given as well.

## 2019-03-15 NOTE — Chronic Care Management (AMB) (Signed)
[place] Screening   03/15/2019 Name: MODEAN MCCULLUM MRN: [id number] DOB: January 22, 1985 KAYLYNN CHAMBLIN is a 34 y.o. year old female who sees Guadalupe Dawn, MD for primary care.  LCSW was consulted to assess mental health needs and assist the patient with Mental Health Counseling and Resources. Patient reports thoughts of not wanting to be here. Denies having a plan or acting on a plan. Presenting issue / symptoms/concerns: Stressed, limited support system Recent life changes: Pregnant   Psychiatric History - Diagnoses: major depression and Bipolar - Pharmacotherapy: none - Outpatient therapy: none  Assessment: Patient is currently experiencing symptoms of Depression  which are exacerbated by psychosocial stressors. PHQ-9 score of 27 is an indication of severe depression. Recommendation: Patient may benefit from, and is in agreement to seek ongoing counseling.   Intervention:Crisis Doctor, hospital provided . Collaborative visit with provider,  OBJECTIVE:  Depression screen [facility] 2/9 03/15/2019 03/15/2019 03/08/2019  Decreased Interest 3 3 0  Down, Depressed, Hopeless 3 3 2   PHQ - 2 Score 6 6 2   Altered sleeping 3 - -  Tired, decreased energy 3 - -  Change in appetite 3 - -  Feeling bad or failure about yourself  3 - -  Trouble concentrating 3 - -  Moving slowly or fidgety/restless 3 - -  Suicidal thoughts 3 - -  PHQ-9 Score 27 - -  Difficult doing work/chores - - -  Some recent data might be hidden    Facility-Administered Encounter Medications as of 03/15/2019  Medication  . [COMPLETED] clindamycin (CLEOCIN) IVPB 600 mg  . [COMPLETED] ondansetron (ZOFRAN) injection 4 mg  . [COMPLETED] sodium chloride 0.9 % bolus 1,000 mL  . sodium chloride flush (NS) 0.9 % injection 3 mL   Outpatient Encounter Medications as of 03/15/2019  Medication Sig  . clindamycin (CLEOCIN) 150 MG capsule Take 3 capsules (450 mg total) by mouth 3 (three) times  daily.  . miconazole (MICOTIN) 100 MG vaginal suppository Place 1 suppository (100 mg total) vaginally at bedtime.  . Prenatal Vit-Fe Fumarate-FA (PRENATAL VITAMIN) 27-0.8 MG TABS Take 1 tablet by mouth daily.  . promethazine (PHENERGAN) 25 MG tablet Take 1 tablet (25 mg total) by mouth every 6 (six) hours as needed for nausea or vomiting. May insert vaginally, if unable to keep anything down  . traMADol (ULTRAM) 50 MG tablet Take 1 tablet (50 mg total) by mouth every 6 (six) hours as needed for moderate pain.   Review of patient status, including review of consultants reports, relevant laboratory and other test results, and collaboration with appropriate care team members and the patient's provider was performed as part of comprehensive patient evaluation and provision of care management services.    SDOH (Social Determinants of Health) assessments performed: Yes SDOH Interventions     Most Recent Value  SDOH Interventions  SDOH Interventions for the Following Domains  Depression, Stress  Stress Interventions  Provide Counseling, Other (Comment) [EMMI education]  Depression Interventions/Treatment   -- [referral for ongoing counseling]      Goals Addressed            This Visit's Progress   . Community resources       CARE PLAN ENTRY (see longtitudinal plan of care for additional care plan information)  Current Barriers:  . Patient with Depression, Bipolar Disorder, and Pregnant  needs community resources,  . Acknowledges deficits and needs support, education and care coordination in order to meet this unmet  need  . Limited social support Clinical Goal(s)  . Over the next 30 days, patient will follow up with [facility]  Interventions provided by LCSW:  . Assessed patient's care coordination needs  . Advised patient to [facility] and explained services they provide . Collaborated with PCP regarding patient needs Patient Self Care  Activities & Deficits:  . Patient is unable to independently navigate community resource options without care coordination support  . Acknowledges deficits and is motivated to resolve concern  . Patient is able to contact agencies as discussed today Initial goal documentation     . On going counseling       CARE PLAN ENTRY (see longtitudinal plan of care for additional care plan information)  Current Barriers:  . Patient with Depression, Bipolar Disorder, and [place]  Acknowledges deficits with connecting to mental health provider for ongoing counseling.  . Patient is experiencing symptoms of depression which seem to be exacerbated by pregnancy and limited support system.     . Patient had negative experience with a counselor in the past Clinical Social Work Goal(s):  Marland Kitchen Over the next 30 days, patient will work with SW by telephone to reduce or manage symptoms of depression until connected for ongoing counseling resources.  Interventions:  . Assessed patient's understanding, education, previous treatment  . Patient interviewed and appropriate assessments performed:  . Provided basic mental health support, education and interventions  . Collaborated with PCP regarding patient needs . Discussed several options for long term counseling based on need and insurance. Assisted patient with narrowing the options down to [employer] [phone]  ) . Other interventions include: Multimedia programmer provided . EMMI education on Relaxed breathing and Relieving stress.  Patient Self Care Activities & Deficits:  . Patient is unable to independently navigate community resource options without care coordination support . Patient is able to implement clinical interventions discussed today and is motivated for treatment  . Patient will call [facility] [phone] to schedule an appointment  Initial goal documentation      Plan:  1. Patient will call [facility]  [phone]  To schedule therapy appointment 2. LCSW will F/U with patient via phone in 2 days.  Sammuel Hines, LCSW Clinical Social Worker [facility] / [facility]   [phone] 12:04 PM

## 2019-03-15 NOTE — ED Provider Notes (Signed)
[facility] EMERGENCY DEPARTMENT Provider Note   CSN: [id number] Arrival date & time: 03/15/19  3762     History Chief Complaint  Patient presents with  . Abscess    Rachel Lloyd is a 34 y.o. female.  The history is provided by the patient. No language interpreter was used.  Abscess Abscess quality: redness and warmth   Red streaking: no   Progression:  Worsening Chronicity:  New Relieved by:  Nothing Worsened by:  Nothing Ineffective treatments:  None tried Associated symptoms: vomiting   Pt reports she has had a red swollen area since 2/24.  Pt was seen at [facility] and had I and D.  Pt went to family practice today and was told to come to ED.  Pt has not been able to take antibiotic due to vomiting.  Last dose of clindamycin on Friday. Pt states she was told she could not follow up at [facility] because it was not a pregnancy problem      Past Medical History:  Diagnosis Date  . Chlamydia   . Contraception management 05/21/2011  . Hypertension    During second pregnancy  . Mental disorder 2010   bipolar  . Pneumonia   . Trichimoniasis     Patient Active Problem List   Diagnosis Date Noted  . Post-procedural fever 03/09/2019  . Cellulitis of right thigh 03/09/2019  . Nausea & vomiting 03/09/2019  . Abscess 03/08/2019  . Trichomonas infection 02/18/2019  . Contact dermatitis 02/18/2019  . Positive pregnancy test 10/06/2018  . Migraine with aura 09/01/2018  . 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 Vaginal discharge 05/21/2011  . Supervision of normal pregnancy 04/09/2011  . Bipolar disorder (HCC) 03/18/2007    Past Surgical History:  Procedure Laterality Date  . INDUCED ABORTION    . MOUTH SURGERY     as a child  . WISDOM TOOTH EXTRACTION       OB History    Gravida  10   Para  4   Term  4   Preterm  0   AB  5   Living  4     SAB  1   TAB  4   Ectopic  0   Multiple  0   Live  Births  4           Family History  Problem Relation Age of Onset  . Hypertension Mother   . Cancer Mother   . Fibroids Mother   . Healthy Father   . ADD / ADHD Brother   . Hypertension Maternal Grandmother   . Anesthesia problems Neg Hx     Social History   Tobacco Use  . Smoking status: Former Smoker    Types: Cigarettes    Quit date: 04/04/2010    Years since quitting: 8.9  . Smokeless tobacco: Never Used  Substance Use Topics  . Alcohol use: Yes    Comment: occasional  . Drug use: Yes    Types: Marijuana, Cocaine    Comment: marijuana last week    Home Medications Prior to Admission medications   Medication Sig Start Date End Date Taking? Authorizing Provider  clindamycin (CLEOCIN) 150 MG capsule Take 3 capsules (450 mg total) by mouth 3 (three) times daily. 03/09/19   Raelyn Mora, CNM  miconazole (MICOTIN) 100 MG vaginal suppository Place 1 suppository (100 mg total) vaginally at bedtime. 03/01/19   Mullis, Dara Lords, DO  Prenatal Vit-Fe Fumarate-FA (  PRENATAL VITAMIN) 27-0.8 MG TABS Take 1 tablet by mouth daily. 10/06/18   Shirley, [place], DO  promethazine (PHENERGAN) 25 MG tablet Take 1 tablet (25 mg total) by mouth every 6 (six) hours as needed for nausea or vomiting. May insert vaginally, if unable to keep anything down 03/09/19   Laury Deep, CNM  traMADol (ULTRAM) 50 MG tablet Take 1 tablet (50 mg total) by mouth every 6 (six) hours as needed for moderate pain. 03/09/19   Laury Deep, CNM    Allergies    Penicillins and Latex  Review of Systems   Review of Systems  Gastrointestinal: Positive for vomiting.  All other systems reviewed and are negative.   Physical Exam Updated Vital Signs BP 110/79 (BP Location: Right Arm)   Pulse 87   Temp 97.7 F (36.5 C) (Oral)   Resp 20   LMP 01/27/2019   SpO2 92%   Physical Exam Vitals and nursing note reviewed.  Constitutional:      Appearance: She is well-developed.  HENT:     Head:  Normocephalic.  Cardiovascular:     Rate and Rhythm: Normal rate.  Pulmonary:     Effort: Pulmonary effort is normal.  Abdominal:     General: There is no distension.  Musculoskeletal:        General: Normal range of motion.     Cervical back: Normal range of motion.  Skin:    Comments: Swollen red area right inner thigh,   Neurological:     Mental Status: She is alert and oriented to person, place, and time.  Psychiatric:        Mood and Affect: Mood normal.     ED Results / Procedures / Treatments   Labs (all labs ordered are listed, but only abnormal results are displayed) Labs Reviewed  LACTIC ACID, PLASMA  COMPREHENSIVE METABOLIC PANEL  CBC WITH DIFFERENTIAL/PLATELET  HCG, QUANTITATIVE, PREGNANCY    EKG None  Radiology No results found.  Procedures Procedures (including critical care time)  Medications Ordered in ED Medications  sodium chloride flush (NS) 0.9 % injection 3 mL (has no administration in time range)  sodium chloride 0.9 % bolus 1,000 mL (has no administration in time range)  ondansetron (ZOFRAN) injection 4 mg (has no administration in time range)  clindamycin (CLEOCIN) IVPB 600 mg (has no administration in time range)    ED Course  I have reviewed the triage vital signs and the nursing notes.  Pertinent labs & imaging results that were available during my care of the patient were reviewed by me and considered in my medical decision making (see chart for details).  Clinical Course as of Mar 14 1037  Tue Mar 15, 2019  1037 hCG, quantitative, pregnancy [LS]    Clinical Course User Index [LS] Fransico Meadow, [place]   MDM Rules/Calculators/A&P                     Pt reports pcn reaction as an infant. I think it is probably safe to use keflex   Final Clinical Impression(s) / ED Diagnoses Final diagnoses:  Cellulitis of right leg    Rx / DC Orders ED Discharge Orders         Ordered    metoCLOPramide (REGLAN) 10 MG tablet  3 times  daily with meals     03/15/19 1347    cephALEXin (KEFLEX) 500 MG capsule  4 times daily     03/15/19 1347  An After Visit Summary was printed and given to the patient.    Elson Areas, [place] 03/15/19 1348    Pricilla Loveless, MD 03/16/19 [phone]

## 2019-03-15 NOTE — Patient Instructions (Signed)
Support in a Crisis What if I or someone I know is in crisis?  . If you are thinking about harming yourself or having thoughts of suicide, or if you know someone who is, seek help right away. . Call your doctor or mental health care provider. . Call 911 or go to a hospital emergency room to get immediate help, or ask a friend or family member to help you do these things. . Call the [place] National Suicide Prevention Lifeline's toll-free, 24-hour hotline at 1-800-273-TALK (364) 864-0036) or TTY: 1-800-799-4 TTY 217-733-4673) to talk to a trained counselor. . If you are in crisis, make sure you are not left alone.  . If someone else is in crisis, make sure he or she is not left alone  24 Hour Availability [facility]  [address], [place], [place] [address]  [phone] or [phone]  [facility] [employer] (Domestic Violence, Rape & Victim Assistance [phone]  [facility]  [address], [place]  [address]               [phone] or [phone]  [place]    (ONLY from 8am-4pm)    [phone]  [facility] (24/7)   [phone]  [place] National Suicide Hotline   (270)286-4785 Diamantina Monks)   If you are feeling suicidal or depression symptoms worsen please immediately go to:   24 Hour Availability [facility]  [address], [place], [place] [address]  [phone] or [phone]  . If you are thinking about harming yourself or having thoughts of suicide, or if you know someone who is, seek help right away. . Call your doctor or mental health care provider. . Call 911 or go to a hospital emergency room to get immediate help, or ask a friend or family member to help you do these things. . Call the [place] National Suicide Prevention Lifeline's toll-free, 24-hour hotline at 1-800-273-TALK (720)168-6033) or TTY: 1-800-799-4 TTY  6066882191) to talk to a trained counselor. . If you are in crisis, make sure you are not left alone.  . If someone else is in crisis, make sure he or she is not left alone   [facility] [employer] (Domestic Violence, Rape & Victim Assistance [phone]  [facility]  [address], [place]  [address]               [phone] or [phone]  [facility]    (ONLY from 8am-4pm)    [phone]  [facility] (24/7)   [phone]  [place] National Suicide Hotline   8544767858 Diamantina Monks)  Please call [employer] [phone] to schedule an appointment    Cellulitis, Adult  Cellulitis is a skin infection. The infected area is often warm, red, swollen, and sore. It occurs most often in the arms and lower legs. It is very important to get treated for this condition. What are the causes? This condition is caused by bacteria. The bacteria enter through a break in the skin, such as a cut, burn, insect bite, open sore, or crack. What increases the risk? This condition is more likely to occur in people who:  Have a weak body defense system (immune system).  Have open cuts, burns, bites, or scrapes on the skin.  Are older than 34 years of age.  Have a blood sugar problem (  diabetes).  Have a long-lasting (chronic) liver disease (cirrhosis) or kidney disease.  Are very overweight (obese).  Have a skin problem, such as: ? Itchy rash (eczema). ? Slow movement of blood in the veins (venous stasis). ? Fluid buildup below the skin (edema).  Have been treated with high-energy rays (radiation).  Use IV drugs. What are the signs or symptoms? Symptoms of this condition include:  Skin that is: ? Red. ? Streaking. ? Spotting. ? Swollen. ? Sore or painful when you touch it. ? Warm.  A fever.  Chills.  Blisters. How is this diagnosed? This condition is  diagnosed based on:  Medical history.  Physical exam.  Blood tests.  Imaging tests. How is this treated? Treatment for this condition may include:  Medicines to treat infections or allergies.  Home care, such as: ? Rest. ? Placing cold or warm cloths (compresses) on the skin.  Hospital care, if the condition is very bad. Follow these instructions at home: Medicines  Take over-the-counter and prescription medicines only as told by your doctor.  If you were prescribed an antibiotic medicine, take it as told by your doctor. Do not stop taking it even if you start to feel better. General instructions   Drink enough fluid to keep your pee (urine) pale yellow.  Do not touch or rub the infected area.  Raise (elevate) the infected area above the level of your heart while you are sitting or lying down.  Place cold or warm cloths on the area as told by your doctor.  Keep all follow-up visits as told by your doctor. This is important. Contact a doctor if:  You have a fever.  You do not start to get better after 1-2 days of treatment.  Your bone or joint under the infected area starts to hurt after the skin has healed.  Your infection comes back. This can happen in the same area or another area.  You have a swollen bump in the area.  You have new symptoms.  You feel ill and have muscle aches and pains. Get help right away if:  Your symptoms get worse.  You feel very sleepy.  You throw up (vomit) or have watery poop (diarrhea) for a long time.  You see red streaks coming from the area.  Your red area gets larger.  Your red area turns dark in color. These symptoms may represent a serious problem that is an emergency. Do not wait to see if the symptoms will go away. Get medical help right away. Call your local emergency services (911 in the [place] Do not drive yourself to the hospital. Summary  Cellulitis is a skin infection. The area is often warm, red, swollen,  and sore.  This condition is treated with medicines, rest, and cold and warm cloths.  Take all medicines only as told by your doctor.  Tell your doctor if symptoms do not start to get better after 1-2 days of treatment. This information is not intended to replace advice given to you by your health care provider. Make sure you discuss any questions you have with your health care provider. Document Revised: 05/21/2017 Document Reviewed: 05/21/2017 Elsevier Patient Education  2020 [employer].

## 2019-03-17 ENCOUNTER — Other Ambulatory Visit: Payer: Self-pay | Admitting: Family Medicine

## 2019-03-17 ENCOUNTER — Other Ambulatory Visit: Payer: Self-pay

## 2019-03-17 ENCOUNTER — Ambulatory Visit: Payer: Medicaid Other | Admitting: Licensed Clinical Social Worker

## 2019-03-17 ENCOUNTER — Ambulatory Visit (INDEPENDENT_AMBULATORY_CARE_PROVIDER_SITE_OTHER): Payer: Medicaid Other | Admitting: Family Medicine

## 2019-03-17 DIAGNOSIS — Z7189 Other specified counseling: Secondary | ICD-10-CM

## 2019-03-17 DIAGNOSIS — Z91199 Patient's noncompliance with other medical treatment and regimen due to unspecified reason: Secondary | ICD-10-CM | POA: Insufficient documentation

## 2019-03-17 DIAGNOSIS — F314 Bipolar disorder, current episode depressed, severe, without psychotic features: Secondary | ICD-10-CM

## 2019-03-17 DIAGNOSIS — Z3481 Encounter for supervision of other normal pregnancy, first trimester: Secondary | ICD-10-CM

## 2019-03-17 DIAGNOSIS — Z5329 Procedure and treatment not carried out because of patient's decision for other reasons: Secondary | ICD-10-CM | POA: Insufficient documentation

## 2019-03-17 MED ORDER — PRENATAL VITAMIN 27-0.8 MG PO TABS: 1.0000 | ORAL_TABLET | Freq: Every day | ORAL | 3 refills | Status: DC

## 2019-03-17 NOTE — Progress Notes (Signed)
INTERIM:  Patient called on her cell phone, was reminded of her appointment today 3/4, which she states she completely forgot about.  Did not sleep very well last night as she had been having vivid dreams from tramadol, as well as some nausea/vomiting from eating large spaghetti meal.   Cellulitis: Patient currently dealing with lower extremity cellulitis.  Visited the emergency department where she was given Keflex.  Patient reports she has not started taking the Keflex because "I do not know if it safe for me to take all these medicines while I am pregnant".  I told patient that Keflex is safe for her to take during pregnancy and encouraged her to go to the pharmacy now to get the medication and start taking it, 1 tablet 4 times daily until all the tablets are gone.  Plan: -Prescribed patient prenatal vitamin -Told her to go get Keflex to be taken 4 times daily to all tablets are gone -Told her front office will call her to reschedule initial OB appointment, as well as follow-up appointment for cellulitis   Peggyann Shoals, DO [facility] Family [facility], PGY-2 03/17/2019 1:56 PM

## 2019-03-17 NOTE — Chronic Care Management (AMB) (Signed)
Care Management   Clinical Social Work Follow Up   03/17/2019 Name: Rachel Lloyd MRN: [id number] DOB: December 04, 1985  Referred by: Rachel Dawn, MD  Reason for referral : Care Coordination (crisis F/U)  ALIHA Lloyd is a 34 y.o. year old female who is a primary care patient of Rachel Dawn, MD.  Reason for follow-up: Phone encounter with patient today for ongoing assessment and brief interventions to assist with care coordination needs.   Assessment: Patient has declined ongoing counseling support.  States she knows how to manager her feelings and emotions. Reports she is doing better and knows the emotions are related to her pregnancy.     Review of patient status, including review of consultants reports, relevant laboratory and other test results, and collaboration with appropriate care team members and the patient's provider was performed as part of comprehensive patient evaluation and provision of care management services.    SDOH (Social Determinants of Health) assessments performed: Yes SDOH Interventions     Most Recent Value  SDOH Interventions  Stress Interventions  -- [EMMI education on managing stress]  Depression Interventions/Treatment   Patient refuses Treatment      Goals Addressed            This Visit's Progress   . Community resources   Not on track    [place] (see longtitudinal plan of care for additional care plan information)  Current Barriers:  . Patient with Depression, Bipolar Disorder, and Pregnant  needs community resources,  . Acknowledges deficits and needs support, education and care coordination in order to meet this unmet need  . Limited social support Clinical Goal(s)  . Over the next 30 days, patient will follow up with [facility]  Interventions provided by LCSW:  . Assessed patient's care coordination needs  . Advised patient to [facility] and explained services they  provide . Collaborated with PCP regarding patient needs Patient Self Care Activities & Deficits:  . Patient is unable to independently navigate community resource options without care coordination support  . Acknowledges deficits and is motivated to resolve concern  . Patient is able to contact agencies as discussed today Initial goal documentation    . On going counseling   Not on track    [place] (see longtitudinal plan of care for additional care plan information)  Current Barriers:  . Patient with Depression, Bipolar Disorder, and [place]  Acknowledges deficits with connecting to mental health provider for ongoing counseling.  . Patient is experiencing symptoms of depression which seem to be exacerbated by pregnancy and limited support system.     . Patient had negative experience with a counselor in the past . Patient did not call counseling resources provided states not interested at this time, Stated she knows how to manage her feelings and emotions Clinical Social Work Goal(s):  Marland Kitchen Over the next 30 days, patient will work with SW by telephone to reduce or manage symptoms of depression to see if she is willing to connect for ongoing counseling resources.  Interventions:  . Assessed patient's understanding, education, previous treatment  . Provided basic mental health support, education and interventions  . Discussed several options for long term counseling based on need and insurance.  . Other interventions include: Crisis Doctor, hospital provided . EMMI education on Relaxed breathing and Relieving stress.  Patient Self Care Activities & Deficits:  . Patient is unable to independently navigate community resource options without care coordination  support . Patient is able to implement clinical interventions discussed today and is motivated for treatment  . Patient is not interested in pursuing counseling at this time . Please see past updates related to this goal  by clicking on the "Past Updates" button in the selected goal         Outpatient Encounter Medications as of 03/17/2019  Medication Sig  . cephALEXin (KEFLEX) 500 MG capsule Take 1 capsule (500 mg total) by mouth 4 (four) times daily for 10 days.  . clindamycin (CLEOCIN) 150 MG capsule Take 3 capsules (450 mg total) by mouth 3 (three) times daily.  . metoCLOPramide (REGLAN) 10 MG tablet Take 1 tablet (10 mg total) by mouth 3 (three) times daily with meals.  . miconazole (MICOTIN) 100 MG vaginal suppository Place 1 suppository (100 mg total) vaginally at bedtime.  . Prenatal Vit-Fe Fumarate-FA (PRENATAL VITAMIN) 27-0.8 MG TABS Take 1 tablet by mouth daily.  . promethazine (PHENERGAN) 25 MG tablet Take 1 tablet (25 mg total) by mouth every 6 (six) hours as needed for nausea or vomiting. May insert vaginally, if unable to keep anything down  . traMADol (ULTRAM) 50 MG tablet Take 1 tablet (50 mg total) by mouth every 6 (six) hours as needed for moderate pain.   No facility-administered encounter medications on file as of 03/17/2019.   Plan: Patient would like a one week F/U check in call  Sammuel Hines, LCSW Clinical Social Worker [facility] / [facility]   [phone] 10:57 AM

## 2019-03-17 NOTE — Progress Notes (Signed)
Initial OB Labs ordered for patient.  Does not need electrophoresis as patient tested negative for sickle cell in 2012.  Peggyann Shoals, DO [facility], PGY-2 03/17/2019 2:01 PM

## 2019-03-17 NOTE — Patient Instructions (Signed)
Patient no showed. Told front office would call her to reschedule.

## 2019-03-17 NOTE — Patient Instructions (Signed)
Licensed Clinical Social Worker Visit Information Ms. Bernstein  it was nice speaking with you. Please call me directly if you have questions [phone] Goals we discussed today:  Goals Addressed            This Visit's Progress   . Community resources   Not on track    [place] (see longtitudinal plan of care for additional care plan information)  Current Barriers:  . Patient with Depression, Bipolar Disorder, and Pregnant  needs community resources,  . Acknowledges deficits and needs support, education and care coordination in order to meet this unmet need  . Limited social support  Clinical Goal(s)  . Over the next 30 days, patient will follow up with [facility]  Interventions provided by LCSW:  . Assessed patient's care coordination needs  . Advised patient to [facility] and explained services they provide . Collaborated with PCP regarding patient needs Patient Self Care Activities & Deficits:  . Patient is unable to independently navigate community resource options without care coordination support  . Acknowledges deficits and is motivated to resolve concern  . Patient is able to contact agencies as discussed today Initial goal documentation      . On going counseling   Not on track    [place] (see longtitudinal plan of care for additional care plan information)  Current Barriers:  . Patient with Depression, Bipolar Disorder, and [place]  Acknowledges deficits with connecting to mental health provider for ongoing counseling.  . Patient is experiencing symptoms of depression which seem to be exacerbated by pregnancy and limited support system.     . Patient had negative experience with a counselor in the past . Patient did not call counseling resources provided states not interested at this time, Stated she knows how to manage her feelings and emotions Clinical Social Work Goal(s):  Marland Kitchen Over the next 30 days, patient  will work with SW by telephone to reduce or manage symptoms of depression to see if she is willing to connect for ongoing counseling resources.  Interventions:  . Assessed patient's understanding, education, previous treatment  . Provided basic mental health support, education and interventions  . Discussed several options for long term counseling based on need and insurance.  . Other interventions include: Crisis Doctor, hospital provided . EMMI education on Relaxed breathing and Relieving stress.  Patient Self Care Activities & Deficits:  . Patient is unable to independently navigate community resource options without care coordination support . Patient is able to implement clinical interventions discussed today and is motivated for treatment  . Patient is not interested in pursuing counseling at this time . Please see past updates related to this goal by clicking on the "Past Updates" button in the selected goal        Materials provided: Yes: EMMI education  Ms. Judeth Horn received Care Management services today:  1. Care Management services include personalized support from designated clinical staff supervised by her physician, including individualized plan of care and coordination with other care providers 2. 24/7 contact [phone] for assistance for urgent and routine care needs. 3. Care Management are voluntary services and be declined at any time by calling the office.  Patient  verbally agreed to assistance and services provided by embedded care coordination/care management team today.   Patient verbalizes understanding of instructions provided today.  Follow up plan:  SW will follow up with patient by phone over the next week  Bosie Helper  Christell Constant, LCSW

## 2019-03-17 NOTE — Progress Notes (Signed)
  Patient Name: Rachel Lloyd Date of Birth: [birth year]  Patient was late, we called her on her cell phone, states she forgot the appointment.  We will ask front office staff to call patient to reschedule follow-up for leg cellulitis, as well as 1-hour-long new OB appointment.   Peggyann Shoals, DO [facility] Family [facility], PGY-2 03/17/2019 2:03 PM

## 2019-03-21 ENCOUNTER — Telehealth (INDEPENDENT_AMBULATORY_CARE_PROVIDER_SITE_OTHER): Payer: Medicaid Other | Admitting: Family Medicine

## 2019-03-21 ENCOUNTER — Other Ambulatory Visit: Payer: Self-pay

## 2019-03-21 DIAGNOSIS — L03115 Cellulitis of right lower limb: Secondary | ICD-10-CM

## 2019-03-21 NOTE — Progress Notes (Signed)
[facility] Telemedicine Visit  Patient consented to have virtual visit. Method of visit: Telephone  Encounter participants: Patient: Rachel Lloyd - located at home Provider: Myrene Buddy - located at [facility] Others (if applicable): None  Chief Complaint: Leg pain   HPI: 34 year old female presents for follow-up for cellulitis.  Her issue for started on 2/23 she was found to have an abscess.  After drainage of this abscess she developed spreading erythema, swelling pain.  She was diagnosed with cellulitis and treated with clindamycin initially.  This had to be stopped developed intense nausea.  This was switched to Keflex which she states has not been helpful.  Her course has been complicated by pregnancy, which has influenced antibiotic choices.  She states that she has been having a lot of pain, swelling, and erythema to her thigh. She has been receiving the keflex for 6 days at this point. She states that the pain is so severe at times that she cannot get out of the bed.  Lastly she states that she has made the decision to terminate the pregnancy. She has an appointment tomorrow for this.  ROS: per HPI  Pertinent PMHx: History of vaginal abscess, pregnancy  Exam:  General: No acute distress, resting comfortably, vomited x1 while on the phone Resp: no accessory muscle use, no distress Psych: appropriate, coherent thought process  Assessment/Plan:  Cellulitis of right thigh Given that she has not made any improvement while on antibiotics for 6 days I recommended in person evaluation. Given her extreme pain and reported symptoms I recommended an emergency department visit. Patient voiced understanding and is in agreement. She will need MRSA coverage, options will be expanded given her desire for pregnancy termination.    Time spent during visit with patient: 13 minutes

## 2019-03-21 NOTE — Assessment & Plan Note (Signed)
Given that she has not made any improvement while on antibiotics for 6 days I recommended in person evaluation. Given her extreme pain and reported symptoms I recommended an emergency department visit. Patient voiced understanding and is in agreement. She will need MRSA coverage, options will be expanded given her desire for pregnancy termination.

## 2019-03-25 ENCOUNTER — Ambulatory Visit: Payer: Medicaid Other | Admitting: Licensed Clinical Social Worker

## 2019-03-25 ENCOUNTER — Other Ambulatory Visit: Payer: Self-pay

## 2019-03-25 DIAGNOSIS — F314 Bipolar disorder, current episode depressed, severe, without psychotic features: Secondary | ICD-10-CM

## 2019-03-25 DIAGNOSIS — Z7189 Other specified counseling: Secondary | ICD-10-CM

## 2019-03-25 NOTE — Patient Instructions (Signed)
Licensed Clinical Social Worker Visit Information Ms. Lasala  it was nice speaking with you. Please call me directly if you have questions [phone] Goals we discussed today:  Goals Addressed            This Visit's Progress   . COMPLETED: Community resources       CARE PLAN ENTRY (see longtitudinal plan of care for additional care plan information)  Current Barriers & progress:  . Patient with Depression, Bipolar Disorder, and Pregnant  needs community resources,  . Acknowledges deficits and needs support, education and care coordination in order to meet this unmet need  . Limited social support . Patient called and connected with [facility] Clinical Goal(s)  . Over the next 30 days, patient will follow up with [facility]  Interventions provided by LCSW:  . Assessed patient's care coordination needs  . Advised patient to [facility] and explained services they provide . Collaborated with PCP regarding patient needs Patient Self Care Activities & Deficits:  . Patient is unable to independently navigate community resource options without care coordination support  . Acknowledges deficits and is motivated to resolve concern  . Patient is able to contact agencies as discussed today Please see past updates related to this goal by clicking on the "Past Updates" button in the selected goal       Materials provided:  Ms. Brawley received Care Management services today:  1. Care Management services include personalized support from designated clinical staff supervised by her physician, including individualized plan of care and coordination with other care providers 2. 24/7 contact [phone] for assistance for urgent and routine care needs. 3. Care Management are voluntary services and be declined at any time by calling the office. Patient verbally agreed to assistance and services provided by embedded care  coordination/care management team today.   Patient verbalizes understanding of instructions provided today.  Follow up plan: No follow up required at this time  [name], LCSW

## 2019-03-25 NOTE — Chronic Care Management (AMB) (Signed)
Care Management   Clinical Social Work Follow Up   03/25/2019 Name: Rachel Lloyd MRN: [id number] DOB: 1985-07-13  Referred by: Guadalupe Dawn, MD  Reason for referral : Care Coordination (F/U call )  Rachel Lloyd is a 34 y.o. year old female who is a primary care patient of Guadalupe Dawn, MD.  Reason for follow-up: Phone encounter with patient today for ongoing assessment and brief interventions to assist with care coordination needs for community resources and mental health support.   Assessment:Patient has connected with community resources provided.  Reports she is feeling better utilizing relaxation tech. And denies SI.  Patient decided she no longer wants to pursue counseling.  Patient reports continued concern with her leg and states PCP advised her to go the ER however she has not gone.  Intervention: . Advised patient to follow advise of PCP and go to ER . Motivational Interviewing and solution focused interventions provided to remove barriers to care  Review of patient status, including review of consultants reports, relevant laboratory and other test results, and collaboration with appropriate care team members and the patient's provider was performed as part of comprehensive patient evaluation and provision of care management services.    SDOH (Social Determinants of Health) assessments performed: No   Goals Addressed            This Visit's Progress   . COMPLETED: Community resources       CARE PLAN ENTRY (see longtitudinal plan of care for additional care plan information)  Current Barriers & progress:  . Patient with Depression, Bipolar Disorder, and Pregnant  needs community resources,  . Acknowledges deficits and needs support, education and care coordination in order to meet this unmet need  . Limited social support . Patient called and connected with [facility])  . Over the next 30 days, patient will follow up with  [facility]  Interventions provided by LCSW:  . Assessed patient's care coordination needs  . Advised patient to [facility] and explained services they provide . Collaborated with PCP regarding patient needs Patient Self Care Activities & Deficits:  . Patient is unable to independently navigate community resource options without care coordination support  . Acknowledges deficits and is motivated to resolve concern  . Patient is able to contact agencies as discussed today Please see past updates related to this goal by clicking on the "Past Updates" button in the selected goal           Outpatient Encounter Medications as of 03/25/2019  Medication Sig  . cephALEXin (KEFLEX) 500 MG capsule Take 1 capsule (500 mg total) by mouth 4 (four) times daily for 10 days.  . clindamycin (CLEOCIN) 150 MG capsule Take 3 capsules (450 mg total) by mouth 3 (three) times daily.  . metoCLOPramide (REGLAN) 10 MG tablet Take 1 tablet (10 mg total) by mouth 3 (three) times daily with meals.  . miconazole (MICOTIN) 100 MG vaginal suppository Place 1 suppository (100 mg total) vaginally at bedtime.  . Prenatal Vit-Fe Fumarate-FA (PRENATAL VITAMIN) 27-0.8 MG TABS Take 1 tablet by mouth daily.  . promethazine (PHENERGAN) 25 MG tablet Take 1 tablet (25 mg total) by mouth every 6 (six) hours as needed for nausea or vomiting. May insert vaginally, if unable to keep anything down  . traMADol (ULTRAM) 50 MG tablet Take 1 tablet (50 mg total) by mouth every 6 (six) hours as needed for moderate pain.   No facility-administered  encounter medications on file as of 03/25/2019.   Plan: No follow required for LCSW at this time.  Patient will call office if needed  Sammuel Hines, LCSW Clinical Social Worker [facility]   [phone] 11:24 AM

## 2019-03-27 ENCOUNTER — Inpatient Hospital Stay (HOSPITAL_COMMUNITY)
Admission: AD | Admit: 2019-03-27 | Discharge: 2019-03-28 | Disposition: A | Discharge status: Home / Self Care | Payer: Medicaid Other | Attending: Obstetrics and Gynecology | Admitting: Obstetrics and Gynecology

## 2019-03-27 ENCOUNTER — Encounter (HOSPITAL_COMMUNITY): Payer: Self-pay | Admitting: Obstetrics and Gynecology

## 2019-03-27 ENCOUNTER — Other Ambulatory Visit: Payer: Self-pay

## 2019-03-27 DIAGNOSIS — B9689 Other specified bacterial agents as the cause of diseases classified elsewhere: Secondary | ICD-10-CM

## 2019-03-27 DIAGNOSIS — Z3A08 8 weeks gestation of pregnancy: Secondary | ICD-10-CM | POA: Diagnosis not present

## 2019-03-27 DIAGNOSIS — N760 Acute vaginitis: Secondary | ICD-10-CM

## 2019-03-27 DIAGNOSIS — Z880 Allergy status to penicillin: Secondary | ICD-10-CM | POA: Insufficient documentation

## 2019-03-27 DIAGNOSIS — Z87891 Personal history of nicotine dependence: Secondary | ICD-10-CM | POA: Diagnosis not present

## 2019-03-27 DIAGNOSIS — M79604 Pain in right leg: Secondary | ICD-10-CM | POA: Diagnosis present

## 2019-03-27 DIAGNOSIS — O23591 Infection of other part of genital tract in pregnancy, first trimester: Secondary | ICD-10-CM | POA: Diagnosis not present

## 2019-03-27 DIAGNOSIS — O99891 Other specified diseases and conditions complicating pregnancy: Secondary | ICD-10-CM | POA: Diagnosis not present

## 2019-03-27 DIAGNOSIS — L03315 Cellulitis of perineum: Secondary | ICD-10-CM

## 2019-03-27 DIAGNOSIS — L03115 Cellulitis of right lower limb: Secondary | ICD-10-CM | POA: Diagnosis not present

## 2019-03-27 DIAGNOSIS — N898 Other specified noninflammatory disorders of vagina: Secondary | ICD-10-CM | POA: Diagnosis not present

## 2019-03-27 LAB — URINALYSIS, ROUTINE W REFLEX MICROSCOPIC
Bilirubin Urine: NEGATIVE
Glucose, UA: NEGATIVE mg/dL
Ketones, ur: NEGATIVE mg/dL
Nitrite: NEGATIVE
Protein, ur: 30 mg/dL — AB
Specific Gravity, Urine: 1.028 (ref 1.005–1.030)
pH: 6 (ref 5.0–8.0)

## 2019-03-27 MED ORDER — ONDANSETRON 4 MG PO TBDP: 4.0000 mg | ORAL_TABLET | Freq: Three times a day (TID) | ORAL | 1 refills | Status: DC | PRN

## 2019-03-27 MED ORDER — METRONIDAZOLE 0.75 % VA GEL: 1.0000 | Freq: Every day | VAGINAL | 0 refills | Status: DC

## 2019-03-27 MED ORDER — ONDANSETRON 4 MG PO TBDP
4.0000 mg | ORAL_TABLET | Freq: Once | ORAL | Status: AC
  Administered 2019-03-27: 4 mg via ORAL
  Filled 2019-03-27: qty 1

## 2019-03-27 MED ORDER — ACETAMINOPHEN 500 MG PO TABS
1000.0000 mg | ORAL_TABLET | Freq: Once | ORAL | Status: AC
  Administered 2019-03-27: 1000 mg via ORAL
  Filled 2019-03-27: qty 2

## 2019-03-27 MED ORDER — CLINDAMYCIN HCL 300 MG PO CAPS
450.0000 mg | ORAL_CAPSULE | Freq: Once | ORAL | Status: AC
  Administered 2019-03-27: 450 mg via ORAL
  Filled 2019-03-27: qty 1

## 2019-03-27 MED ORDER — CLINDAMYCIN HCL 150 MG PO CAPS: 450.0000 mg | ORAL_CAPSULE | Freq: Three times a day (TID) | ORAL | 0 refills | Status: AC

## 2019-03-27 NOTE — Discharge Instructions (Signed)
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
## 2019-03-27 NOTE — MAU Note (Addendum)
Pt here with complaints of malodorous and burning vaginal discharge x3 days. Pt complains of abdominal pain x 3days. Pt has cellulitis on right leg. Right leg pain. Is suppose to be taking antibiotics but cannot keep them down. States it has "gotten nasty" and draining x3 weeks. Reports "a lot of headaches and I'm suffering."

## 2019-03-28 LAB — WET PREP, GENITAL
Sperm: NONE SEEN
Trich, Wet Prep: NONE SEEN
Yeast Wet Prep HPF POC: NONE SEEN

## 2019-03-29 ENCOUNTER — Other Ambulatory Visit: Payer: Self-pay

## 2019-03-29 ENCOUNTER — Ambulatory Visit (INDEPENDENT_AMBULATORY_CARE_PROVIDER_SITE_OTHER): Payer: Medicaid Other | Admitting: Obstetrics and Gynecology

## 2019-03-29 ENCOUNTER — Ambulatory Visit: Payer: Medicaid Other

## 2019-03-29 ENCOUNTER — Encounter: Payer: Self-pay | Admitting: Obstetrics and Gynecology

## 2019-03-29 VITALS — BP 110/69 | HR 88 | Wt 134.5 lb

## 2019-03-29 DIAGNOSIS — L03315 Cellulitis of perineum: Secondary | ICD-10-CM

## 2019-03-29 DIAGNOSIS — Z3A08 8 weeks gestation of pregnancy: Secondary | ICD-10-CM

## 2019-03-29 LAB — GC/CHLAMYDIA PROBE AMP (~~LOC~~) NOT AT ARMC
Chlamydia: NEGATIVE
Comment: NEGATIVE
Comment: NORMAL
Neisseria Gonorrhea: NEGATIVE

## 2019-03-29 LAB — CULTURE, OB URINE

## 2019-03-29 MED ORDER — PRENATE MINI 18-0.6-0.4-350 MG PO CAPS: 1.0000 | ORAL_CAPSULE | Freq: Every day | ORAL | 12 refills | Status: DC
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
## 2019-03-29 NOTE — Patient Instructions (Signed)
[facility]  [address], [place], [place] [address] [phone] [email or web address]  [facility]   [place]:  [address], [address]nd floor, [place], [place] [address] [phone])  Main line [phone]  [place] location  [place]:  [address], [place], [place] [address] [phone] Main line [phone]  [place] location   Walk-in hours: Monday -Friday 8:30am-4:30am  [email or web address]   If immediate emergency, call 9-1-1, or [facility] 24 hour crisis hotline at: 336-273-7273  [facility] [address], [place], [place] [address] [phone] **temporary housing for victims of domestic violence  [place]:  [facility]  [address], [place], [place] [address] [phone]  http://helpincorporated.org  Monday-Friday, 8:30am-5:00pm      
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
## 2019-03-31 ENCOUNTER — Ambulatory Visit (HOSPITAL_COMMUNITY): Admission: RE | Admit: 2019-03-31 | Payer: Medicaid Other | Source: Ambulatory Visit

## 2019-04-01 LAB — AEROBIC/ANAEROBIC CULTURE W GRAM STAIN (SURGICAL/DEEP WOUND)

## 2019-04-06 ENCOUNTER — Encounter: Payer: Self-pay | Admitting: Family Medicine

## 2019-04-06 ENCOUNTER — Ambulatory Visit: Payer: Medicaid Other | Admitting: Family Medicine

## 2019-04-06 NOTE — Progress Notes (Signed)
Patient did not keep appointment today. She may call to reschedule.  

## 2019-04-08 ENCOUNTER — Telehealth: Payer: Self-pay | Admitting: Family Medicine

## 2019-04-08 NOTE — Telephone Encounter (Signed)
Pt is calling saying she never received the letter int he mail that doctor Primitivo Gauze was supposed to send out. Letter stating the dates she was here and whats going on cause she needs a note for work. Thanks

## 2019-04-12 ENCOUNTER — Telehealth: Payer: Self-pay | Admitting: Family Medicine

## 2019-04-12 NOTE — Telephone Encounter (Signed)
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  [employer]- [phone] [facility]-FAX:[phone]   Pt states she would like Dr to call in something for yeast infection because she has been on Clindamycin for a while and is now needing something for yeast infection. Thanks

## 2019-04-14 ENCOUNTER — Other Ambulatory Visit: Payer: Self-pay

## 2019-04-14 ENCOUNTER — Ambulatory Visit (INDEPENDENT_AMBULATORY_CARE_PROVIDER_SITE_OTHER): Payer: Medicaid Other | Admitting: Family Medicine

## 2019-04-14 VITALS — BP 84/54 | HR 107 | Wt 137.8 lb

## 2019-04-14 DIAGNOSIS — Z3481 Encounter for supervision of other normal pregnancy, first trimester: Secondary | ICD-10-CM

## 2019-04-14 DIAGNOSIS — N898 Other specified noninflammatory disorders of vagina: Secondary | ICD-10-CM

## 2019-04-14 LAB — POCT WET PREP (WET MOUNT)
Clue Cells Wet Prep Whiff POC: NEGATIVE
Trichomonas Wet Prep HPF POC: ABSENT
WBC, Wet Prep HPF POC: >20

## 2019-04-14 MED ORDER — DOXYLAMINE-PYRIDOXINE 10-10 MG PO TBEC: 1.0000 | DELAYED_RELEASE_TABLET | Freq: Two times a day (BID) | ORAL | 0 refills | Status: AC | PRN

## 2019-04-14 MED ORDER — MICONAZOLE 3 200 MG VA SUPP: 200.0000 mg | Freq: Every day | VAGINAL | 0 refills | Status: DC

## 2019-04-14 NOTE — Patient Instructions (Signed)
First Trimester of Pregnancy  The first trimester of pregnancy is from week 1 until the end of week 13 (months 1 through 3). During this time, your baby will begin to develop inside you. At 6-8 weeks, the eyes and face are formed, and the heartbeat can be seen on ultrasound. At the end of 12 weeks, all the baby's organs are formed. Prenatal care is all the medical care you receive before the birth of your baby. Make sure you get good prenatal care and follow all of your doctor's instructions. Follow these instructions at home: Medicines  Take over-the-counter and prescription medicines only as told by your doctor. Some medicines are safe and some medicines are not safe during pregnancy.  Take a prenatal vitamin that contains at least 600 micrograms (mcg) of folic acid.  If you have trouble pooping (constipation), take medicine that will make your stool soft (stool softener) if your doctor approves. Eating and drinking   Eat regular, healthy meals.  Your doctor will tell you the amount of weight gain that is right for you.  Avoid raw meat and uncooked cheese.  If you feel sick to your stomach (nauseous) or throw up (vomit): ? Eat 4 or 5 small meals a day instead of 3 large meals. ? Try eating a few soda crackers. ? Drink liquids between meals instead of during meals.  To prevent constipation: ? Eat foods that are high in fiber, like fresh fruits and vegetables, whole grains, and beans. ? Drink enough fluids to keep your pee (urine) clear or pale yellow. Activity  Exercise only as told by your doctor. Stop exercising if you have cramps or pain in your lower belly (abdomen) or low back.  Do not exercise if it is too hot, too humid, or if you are in a place of great height (high altitude).  Try to avoid standing for long periods of time. Move your legs often if you must stand in one place for a long time.  Avoid heavy lifting.  Wear low-heeled shoes. Sit and stand up  straight.  You can have sex unless your doctor tells you not to. Relieving pain and discomfort  Wear a good support bra if your breasts are sore.  Take warm water baths (sitz baths) to soothe pain or discomfort caused by hemorrhoids. Use hemorrhoid cream if your doctor says it is okay.  Rest with your legs raised if you have leg cramps or low back pain.  If you have puffy, bulging veins (varicose veins) in your legs: ? Wear support hose or compression stockings as told by your doctor. ? Raise (elevate) your feet for 15 minutes, 3-4 times a day. ? Limit salt in your food. Prenatal care  Schedule your prenatal visits by the twelfth week of pregnancy.  Write down your questions. Take them to your prenatal visits.  Keep all your prenatal visits as told by your doctor. This is important. Safety  Wear your seat belt at all times when driving.  Make a list of emergency phone numbers. The list should include numbers for family, friends, the hospital, and police and fire departments. General instructions  Ask your doctor for a referral to a local prenatal class. Begin classes no later than at the start of month 6 of your pregnancy.  Ask for help if you need counseling or if you need help with nutrition. Your doctor can give you advice or tell you where to go for help.  Do not use hot tubs, steam   rooms, or saunas.  Do not douche or use tampons or scented sanitary pads.  Do not cross your legs for long periods of time.  Avoid all herbs and alcohol. Avoid drugs that are not approved by your doctor.  Do not use any tobacco products, including cigarettes, chewing tobacco, and electronic cigarettes. If you need help quitting, ask your doctor. You may get counseling or other support to help you quit.  Avoid cat litter boxes and soil used by cats. These carry germs that can cause birth defects in the baby and can cause a loss of your baby (miscarriage) or stillbirth.  Visit your dentist.  At home, brush your teeth with a soft toothbrush. Be gentle when you floss. Contact a doctor if:  You are dizzy.  You have mild cramps or pressure in your lower belly.  You have a nagging pain in your belly area.  You continue to feel sick to your stomach, you throw up, or you have watery poop (diarrhea).  You have a bad smelling fluid coming from your vagina.  You have pain when you pee (urinate).  You have increased puffiness (swelling) in your face, hands, legs, or ankles. Get help right away if:  You have a fever.  You are leaking fluid from your vagina.  You have spotting or bleeding from your vagina.  You have very bad belly cramping or pain.  You gain or lose weight rapidly.  You throw up blood. It may look like coffee grounds.  You are around people who have German measles, fifth disease, or chickenpox.  You have a very bad headache.  You have shortness of breath.  You have any kind of trauma, such as from a fall or a car accident. Summary  The first trimester of pregnancy is from week 1 until the end of week 13 (months 1 through 3).  To take care of yourself and your unborn baby, you will need to eat healthy meals, take medicines only if your doctor tells you to do so, and do activities that are safe for you and your baby.  Keep all follow-up visits as told by your doctor. This is important as your doctor will have to ensure that your baby is healthy and growing well. This information is not intended to replace advice given to you by your health care provider. Make sure you discuss any questions you have with your health care provider. Document Revised: 04/22/2018 Document Reviewed: 01/08/2016 Elsevier Patient Education  2020 Elsevier Inc.  

## 2019-04-14 NOTE — Progress Notes (Signed)
  Patient Name: Rachel Lloyd Date of Birth: 16-May-1985 [facility] Prenatal Visit  Rachel Lloyd is a 34 y.o. [id number] at [redacted]w[redacted]d here for routine follow up. She is dated by LMP.  She reports vaginal irritation.  She denies vaginal bleeding.  See flow sheet for details.  Vitals:   04/14/19 1036  BP: (!) 84/54  Pulse: (!) 107     A/P: Pregnancy at [redacted]w[redacted]d.  Doing well.   . Dating reviewed, dating tab is correct . Fetal heart tones Not detected, only 11 weeks* . Pregnancy issues include social situation risk. . Problem list updated: Yes.  . The patient has the following indication for screening preexisting diabetes: Reviewed indications for early 1 hour glucose testing, not indicated . Marland Kitchen Patient is interested in genetic screening. Integrated screen ordered at women's . Pregnancy education including expected weight gain in pregnancy, OTC medication use, continued use of prenatal vitamin, smoking cessation if applicable, and nutrition in pregnancy.  . Bleeding and pain precautions reviewed. . Follow up 4 weeks for "initial with Dr. Peggyann Shoals" . BTL form filled out, copy given to patient/jessica fleeger and in scan box

## 2019-04-15 LAB — OBSTETRIC PANEL, INCLUDING HIV
Antibody Screen: NEGATIVE
Basophils Absolute: 0 10*3/uL (ref 0.0–0.2)
Basos: 1 %
EOS (ABSOLUTE): 0.1 10*3/uL (ref 0.0–0.4)
Eos: 1 %
HIV Screen 4th Generation wRfx: NONREACTIVE
Hematocrit: 32.7 % — ABNORMAL LOW (ref 34.0–46.6)
Hemoglobin: 11.2 g/dL (ref 11.1–15.9)
Hepatitis B Surface Ag: NEGATIVE
Immature Grans (Abs): 0 10*3/uL (ref 0.0–0.1)
Immature Granulocytes: 0 %
Lymphocytes Absolute: 1.4 10*3/uL (ref 0.7–3.1)
Lymphs: 21 %
MCH: 30.8 pg (ref 26.6–33.0)
MCHC: 34.3 g/dL (ref 31.5–35.7)
MCV: 90 fL (ref 79–97)
Monocytes Absolute: 0.4 10*3/uL (ref 0.1–0.9)
Monocytes: 6 %
Neutrophils Absolute: 4.6 10*3/uL (ref 1.4–7.0)
Neutrophils: 71 %
Platelets: 269 10*3/uL (ref 150–450)
RBC: 3.64 x10E6/uL — ABNORMAL LOW (ref 3.77–5.28)
RDW: 13.6 % (ref 11.7–15.4)
RPR Ser Ql: NONREACTIVE
Rh Factor: POSITIVE
Rubella Antibodies, IGG: 7.15 index (ref >0.99)
WBC: 6.5 10*3/uL (ref 3.4–10.8)

## 2019-04-15 LAB — VARICELLA ZOSTER ANTIBODY, IGG: Varicella zoster IgG: 2036 index (ref >165)

## 2019-04-17 LAB — CULTURE, OB URINE

## 2019-04-17 LAB — URINE CULTURE, OB REFLEX

## 2019-04-27 ENCOUNTER — Ambulatory Visit (HOSPITAL_COMMUNITY): Payer: Medicaid Other

## 2019-04-27 ENCOUNTER — Ambulatory Visit (HOSPITAL_COMMUNITY)
Admission: RE | Admit: 2019-04-27 | Payer: Medicaid Other | Source: Ambulatory Visit | Attending: Family Medicine | Admitting: Family Medicine

## 2019-05-03 DIAGNOSIS — F17200 Nicotine dependence, unspecified, uncomplicated: Secondary | ICD-10-CM | POA: Diagnosis not present

## 2019-05-04 DIAGNOSIS — F17200 Nicotine dependence, unspecified, uncomplicated: Secondary | ICD-10-CM | POA: Diagnosis not present

## 2019-05-06 ENCOUNTER — Encounter: Payer: Medicaid Other | Admitting: Family Medicine

## 2019-05-06 DIAGNOSIS — F17200 Nicotine dependence, unspecified, uncomplicated: Secondary | ICD-10-CM | POA: Diagnosis not present

## 2019-05-09 DIAGNOSIS — F17200 Nicotine dependence, unspecified, uncomplicated: Secondary | ICD-10-CM | POA: Diagnosis not present

## 2019-05-11 DIAGNOSIS — F17200 Nicotine dependence, unspecified, uncomplicated: Secondary | ICD-10-CM | POA: Diagnosis not present

## 2019-05-17 DIAGNOSIS — F17200 Nicotine dependence, unspecified, uncomplicated: Secondary | ICD-10-CM | POA: Diagnosis not present

## 2019-05-20 DIAGNOSIS — F17200 Nicotine dependence, unspecified, uncomplicated: Secondary | ICD-10-CM | POA: Diagnosis not present

## 2019-05-23 DIAGNOSIS — F17200 Nicotine dependence, unspecified, uncomplicated: Secondary | ICD-10-CM | POA: Diagnosis not present

## 2019-05-31 ENCOUNTER — Other Ambulatory Visit: Payer: Self-pay

## 2019-05-31 ENCOUNTER — Other Ambulatory Visit (HOSPITAL_COMMUNITY)
Admission: RE | Admit: 2019-05-31 | Discharge: 2019-05-31 | Disposition: A | Discharge status: Home / Self Care | Payer: Medicaid Other | Source: Ambulatory Visit | Attending: Family Medicine | Admitting: Family Medicine

## 2019-05-31 ENCOUNTER — Ambulatory Visit (INDEPENDENT_AMBULATORY_CARE_PROVIDER_SITE_OTHER): Payer: Medicaid Other | Admitting: Family Medicine

## 2019-05-31 VITALS — BP 108/60 | HR 77 | Wt 137.0 lb

## 2019-05-31 DIAGNOSIS — Z3201 Encounter for pregnancy test, result positive: Secondary | ICD-10-CM

## 2019-05-31 DIAGNOSIS — N898 Other specified noninflammatory disorders of vagina: Secondary | ICD-10-CM | POA: Insufficient documentation

## 2019-05-31 LAB — POCT WET PREP (WET MOUNT)
Clue Cells Wet Prep Whiff POC: NEGATIVE
Trichomonas Wet Prep HPF POC: ABSENT

## 2019-05-31 NOTE — Progress Notes (Signed)
    SUBJECTIVE:   CHIEF COMPLAINT / HPI:   Concern of new pregnancy  Patient is a very pleasant 34 year old female that presents today after having an elective abortion about a month ago and noting positive pregnancy test since then.  Patient states that after her abortion she noted to negative pregnancy test and one positive one at home.  Patient denies any vaginal bleeding though states she did have some immediately after her previous abortion.  Patient states she wants to confirm that she is no longer pregnant and not pregnant again.  Patient states that she would still like to have her tubal ligation which she had consented for with plan to have at the end of this last pregnancy.  Vaginal discharge Patient states that for the past 3 days she has had a increase in a whitish discharge.  She denies any new sexual partners and denies any itching, burning, pain but states that she has had yeast infections in the past and this feels similar to that.  PERTINENT  PMH / PSH: Patient recently had an elective abortion last month.  OBJECTIVE:   BP 108/60   Pulse 77   Wt 137 lb (62.1 kg)   LMP 01/27/2019   BMI 25.06 kg/m   General: Alert and oriented in no apparent distress Heart: Regular rate and rhythm with no murmurs appreciated Lungs: CTA bilaterally Pelvic exam: VULVA: normal appearing vulva with no masses, tenderness or lesions, VAGINA: vaginal discharge - white and creamy   ASSESSMENT/PLAN:   Vaginal discharge Assessment: Vaginal discharge which is thick and white for 3-4 days.  Denies new sexual partners but states she would be interested in having tested for STDs.  No itching or burning present. Plan: -Wet prep performed and negative -We will check for GC/chlamydia as well  Positive pregnancy test Assessment: Patient states she had an elective abortion about 1 month ago.  She notes 2 negative and 1 positive pregnancy tests at home since then and presents today to determine her  pregnancy status. Plan: -Quantitative hCG ordered -Patient states she would start to have her tubal ligation which she had consented for prior with initial plan to have at the end of this pregnancy.  Provided patient the number for women's center to follow-up on scheduling her tubal ligation pending the results of this pregnancy test. -Discussed with the patient that we can perform a Depo-Provera shot if this pregnancy test is negative to provide contraceptive coverage until her tubal ligation is scheduled.    -Blood test for syphilis and HIV ordered per patient request  Jackelyn Poling, DO [facility]

## 2019-05-31 NOTE — Assessment & Plan Note (Deleted)
Assessment: Patient states she had an elective abortion about 1 month ago.  She notes 1 - and 2+ pregnancy tests since then and presents today to determine her pregnancy status. Plan: -Quantitative hCG ordered -Patient states she would start to have her tubal ligation which she had consented for prior with initial plan to have at the end of this pregnancy.  Provided patient the number for women's center to follow-up on scheduling her tubal ligation pending the results of this pregnancy test.

## 2019-05-31 NOTE — Assessment & Plan Note (Addendum)
Assessment: Vaginal discharge which is thick and white for 3-4 days.  Denies new sexual partners but states she would be interested in having tested for STDs.  No itching or burning present. Plan: -Wet prep performed and negative -We will check for GC/chlamydia as well

## 2019-05-31 NOTE — Patient Instructions (Addendum)
It was great to see you!  Our plans for today:  -We performed a wet prep today which was negative -We are checking a blood test for STDs per your request. -We are checking a blood test to confirm that you are no longer pregnant.  -You are still consented for the tubal ligation procedure, simply call [phone] [facility] to determine how to schedule this. -If negative we can also give you a Depo shot to provide protection against pregnancy until you are able to get your tubal ligation procedure. -Please return immediately if you experience any sharp abdominal pains, vaginal bleeding, or other symptoms consistent with an ectopic pregnancy (pregnancy outside the uterus)  We are checking some labs today, I will call you if they are abnormal will send you a MyChart message or a letter if they are normal.  If you do not hear about your labs in the next 2 weeks please let us know.  Take care and seek immediate care sooner if you develop any concerns.   Dr. [facility]

## 2019-05-31 NOTE — Assessment & Plan Note (Addendum)
Assessment: Patient states she had an elective abortion about 1 month ago.  She notes 2 negative and 1 positive pregnancy tests at home since then and presents today to determine her pregnancy status. Plan: -Quantitative hCG ordered -Patient states she would start to have her tubal ligation which she had consented for prior with initial plan to have at the end of this pregnancy.  Provided patient the number for women's center to follow-up on scheduling her tubal ligation pending the results of this pregnancy test. -Discussed with the patient that we can perform a Depo-Provera shot if this pregnancy test is negative to provide contraceptive coverage until her tubal ligation is scheduled.

## 2019-06-01 ENCOUNTER — Other Ambulatory Visit: Payer: Self-pay | Admitting: Family Medicine

## 2019-06-01 DIAGNOSIS — Z3201 Encounter for pregnancy test, result positive: Secondary | ICD-10-CM

## 2019-06-01 LAB — CERVICOVAGINAL ANCILLARY ONLY
Chlamydia: NEGATIVE
Comment: NEGATIVE
Comment: NORMAL
Neisseria Gonorrhea: NEGATIVE

## 2019-06-01 LAB — RPR: RPR Ser Ql: NONREACTIVE

## 2019-06-01 LAB — BETA HCG QUANT (REF LAB): hCG Quant: 24 m[IU]/mL

## 2019-06-01 LAB — HIV ANTIBODY (ROUTINE TESTING W REFLEX): HIV Screen 4th Generation wRfx: NONREACTIVE

## 2019-06-01 NOTE — Progress Notes (Signed)
Called patient to discuss B-Hcg results of 24. Patient had an elective abortion 4 -6 weeks ago and had concern of possible pregnancy due to a positive home pregnancy test. Plan to repeat Hcg on Friday with patient coming in for a lab draw.

## 2019-06-03 ENCOUNTER — Other Ambulatory Visit: Payer: Medicaid Other

## 2019-06-03 ENCOUNTER — Encounter (HOSPITAL_COMMUNITY): Payer: Self-pay

## 2019-06-03 ENCOUNTER — Ambulatory Visit (INDEPENDENT_AMBULATORY_CARE_PROVIDER_SITE_OTHER): Payer: Medicaid Other

## 2019-06-03 ENCOUNTER — Other Ambulatory Visit: Payer: Self-pay

## 2019-06-03 ENCOUNTER — Ambulatory Visit (HOSPITAL_COMMUNITY)
Admission: EM | Admit: 2019-06-03 | Discharge: 2019-06-03 | Disposition: A | Discharge status: Home / Self Care | Payer: Medicaid Other | Attending: Urgent Care | Admitting: Urgent Care

## 2019-06-03 DIAGNOSIS — M25562 Pain in left knee: Secondary | ICD-10-CM | POA: Diagnosis not present

## 2019-06-03 DIAGNOSIS — S8992XA Unspecified injury of left lower leg, initial encounter: Secondary | ICD-10-CM

## 2019-06-03 DIAGNOSIS — M25462 Effusion, left knee: Secondary | ICD-10-CM

## 2019-06-03 MED ORDER — PREDNISONE 10 MG PO TABS: 20.0000 mg | ORAL_TABLET | Freq: Every day | ORAL | 0 refills | Status: DC

## 2019-06-03 MED ORDER — ACETAMINOPHEN 325 MG PO TABS
975.0000 mg | ORAL_TABLET | Freq: Once | ORAL | Status: AC
  Administered 2019-06-03: 975 mg via ORAL

## 2019-06-03 MED ORDER — ACETAMINOPHEN 325 MG PO TABS
ORAL_TABLET | ORAL | Status: AC
  Filled 2019-06-03: qty 3

## 2019-06-03 MED ORDER — ACETAMINOPHEN 500 MG PO TABS: 500.0000 mg | ORAL_TABLET | Freq: Four times a day (QID) | ORAL | 0 refills | Status: DC | PRN
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
## 2019-06-03 NOTE — ED Notes (Signed)
Patient had to leave.  Per Nena Jordan NP request, patient fitted with knee sleeve and crutches before leaving.

## 2019-06-03 NOTE — Discharge Instructions (Addendum)
Rest, ice and heat as needed Ensure adequate ROM as tolerated. Prescribed Tylenol as needed for inflammation and pain relief Prescribed prednisone Return here or go to ER if you have any new or worsening symptoms such as numbness/tingling of the inner thighs, loss of bladder or bowel control, headache/blurry vision, nausea/vomiting, confusion/altered mental status, dizziness, weakness, passing out, imbalance, etc..Marland Kitchen
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
## 2019-06-15 NOTE — Progress Notes (Signed)
Patient did not have repeat Bhcg done.

## 2019-06-16 ENCOUNTER — Other Ambulatory Visit: Payer: Self-pay | Admitting: Family Medicine

## 2019-06-16 DIAGNOSIS — Z3201 Encounter for pregnancy test, result positive: Secondary | ICD-10-CM

## 2019-06-16 NOTE — Progress Notes (Signed)
Called patient after noting that she did not come in for her repeat hCG as discussed during last appointment. Patient stated she had an injury to her knee and has been on crutches and unable to come in since then. I discussed with patient that as she stated last appointment that she would like to have a Depo injection that this would still be a possibility once we confirm that she is not pregnant as her previous hCG was 24. I recommended patient schedule a lab appointment to come in and get the already-ordered hCG quant to determine if she is pregnant or not, I informed the patient that thereafter she can schedule an appointment for a Depo shot to provide contraceptive coverage until her tubal ligation.

## 2019-06-25 DIAGNOSIS — Z03818 Encounter for observation for suspected exposure to other biological agents ruled out: Secondary | ICD-10-CM | POA: Diagnosis not present

## 2019-06-27 NOTE — Progress Notes (Signed)
No show   Sindi Beckworth, MD [facility]   

## 2019-06-28 ENCOUNTER — Ambulatory Visit (INDEPENDENT_AMBULATORY_CARE_PROVIDER_SITE_OTHER): Payer: Medicaid Other | Admitting: Family Medicine

## 2019-06-28 DIAGNOSIS — Z5329 Procedure and treatment not carried out because of patient's decision for other reasons: Secondary | ICD-10-CM

## 2019-06-28 NOTE — Progress Notes (Signed)
No show ° ° °Sumayyah Custodio, MD °[facility]  °

## 2019-06-29 ENCOUNTER — Ambulatory Visit (INDEPENDENT_AMBULATORY_CARE_PROVIDER_SITE_OTHER): Payer: Medicaid Other | Admitting: Family Medicine

## 2019-06-29 DIAGNOSIS — Z5329 Procedure and treatment not carried out because of patient's decision for other reasons: Secondary | ICD-10-CM

## 2019-07-13 DIAGNOSIS — Z03818 Encounter for observation for suspected exposure to other biological agents ruled out: Secondary | ICD-10-CM | POA: Diagnosis not present

## 2019-07-27 DIAGNOSIS — Z03818 Encounter for observation for suspected exposure to other biological agents ruled out: Secondary | ICD-10-CM | POA: Diagnosis not present

## 2019-08-10 DIAGNOSIS — Z03818 Encounter for observation for suspected exposure to other biological agents ruled out: Secondary | ICD-10-CM | POA: Diagnosis not present

## 2019-08-24 DIAGNOSIS — Z03818 Encounter for observation for suspected exposure to other biological agents ruled out: Secondary | ICD-10-CM | POA: Diagnosis not present

## 2019-09-04 ENCOUNTER — Other Ambulatory Visit: Payer: Self-pay | Admitting: Obstetrics and Gynecology

## 2019-09-07 DIAGNOSIS — Z03818 Encounter for observation for suspected exposure to other biological agents ruled out: Secondary | ICD-10-CM | POA: Diagnosis not present

## 2019-09-13 ENCOUNTER — Ambulatory Visit (INDEPENDENT_AMBULATORY_CARE_PROVIDER_SITE_OTHER): Payer: Medicaid Other | Admitting: Family Medicine

## 2019-09-13 ENCOUNTER — Encounter: Payer: Self-pay | Admitting: Family Medicine

## 2019-09-13 ENCOUNTER — Other Ambulatory Visit: Payer: Self-pay

## 2019-09-13 ENCOUNTER — Other Ambulatory Visit (HOSPITAL_COMMUNITY)
Admission: RE | Admit: 2019-09-13 | Discharge: 2019-09-13 | Disposition: A | Discharge status: Home / Self Care | Payer: Medicaid Other | Source: Ambulatory Visit | Attending: Family Medicine | Admitting: Family Medicine

## 2019-09-13 VITALS — BP 125/80 | HR 81 | Ht 62.0 in | Wt 139.4 lb

## 2019-09-13 DIAGNOSIS — N760 Acute vaginitis: Secondary | ICD-10-CM

## 2019-09-13 DIAGNOSIS — N898 Other specified noninflammatory disorders of vagina: Secondary | ICD-10-CM

## 2019-09-13 DIAGNOSIS — Z3009 Encounter for other general counseling and advice on contraception: Secondary | ICD-10-CM

## 2019-09-13 DIAGNOSIS — B9689 Other specified bacterial agents as the cause of diseases classified elsewhere: Secondary | ICD-10-CM

## 2019-09-13 LAB — POCT WET PREP (WET MOUNT)
Clue Cells Wet Prep Whiff POC: POSITIVE
Trichomonas Wet Prep HPF POC: ABSENT

## 2019-09-13 LAB — POCT URINE PREGNANCY: Preg Test, Ur: NEGATIVE

## 2019-09-13 MED ORDER — FLUCONAZOLE 150 MG PO TABS: 150.0000 mg | ORAL_TABLET | ORAL | 0 refills | Status: DC

## 2019-09-13 MED ORDER — METRONIDAZOLE 0.75 % VA GEL: 1.0000 | Freq: Every day | VAGINAL | 0 refills | Status: DC

## 2019-09-13 MED ORDER — METRONIDAZOLE 500 MG PO TABS: 500.0000 mg | ORAL_TABLET | Freq: Two times a day (BID) | ORAL | 0 refills | Status: AC

## 2019-09-13 NOTE — Assessment & Plan Note (Addendum)
[id number] and not currently on contraception.  Normal cycles, LMP 2 weeks ago with negative urine pregnancy test today.  Discussed reinitiating contraception, however patient has had numerous failed trials of several contraceptive methods and is hesitant.  She is considering having a tubal ligation, however does not want to proceed with this currently due to her family thoughts.  Discussed alternative contraception available and follow-up when she decides to proceed with therapy.  Adamantly encouraged condom use in the interim.

## 2019-09-13 NOTE — Assessment & Plan Note (Signed)
Clinical exam and wet prep consistent with BV.  Rx'd MetroGel x5 days (endorses N/V with oral metronidazole) and posttreatment Diflucan.  Educated on preventative measures including loose clothing, appropriate hydration, and avoiding soap/douching within vaginal canal.  Gc/Ch collected and pending.

## 2019-09-13 NOTE — Patient Instructions (Addendum)
Wonderful to see you today.  You do have bacterial vaginosis on wet prep.  We will follow-up for the gonorrhea/chlamydia.  Please make sure that you are staying very well-hydrated with plenty of water throughout the day, avoid tight clothing, wear cotton underwear and avoid thongs, and please make sure that you are using condoms every single time.  Do not do any douching.  Please consider restarting a birth control option to avoid any unwanted pregnancy.

## 2019-09-13 NOTE — Progress Notes (Signed)
    SUBJECTIVE:   CHIEF COMPLAINT / HPI: Vaginal discharge   Rachel Lloyd is a 34 year old female presenting for evaluation of vaginal discharge.   White creamy discharge present for the past 4 days, last sexually active with monogamous female partner ~>1 week ago.  Uses condoms intermittently.  A little vaginal irritation and suprapubic cramping.  No associated dysuria, dyspareunia, fever, fatigue.  No known exposure to STDs, has had trichomonas before.  Similar to previous episodes of BV.  LMP 2 weeks ago, did resume normal periods after recent elective abortion for known pregnancy earlier this year.  No current contraception.  Previously used the Depo injections however states this had never been effective for her and has had 3 children while using this therapy.  Previously had Mirena IUD which had to be removed due to improper placement.  Said the [facility] patches made her "aggressive" when she was younger.  Not interested in Nexplanon.  She is considering getting her tubes tied however her family is telling her to wait because she is not married.  PERTINENT  PMH / PSH: Bipolar disorder, previous trichomonas, recurrent BV  OBJECTIVE:   BP 125/80   Pulse 81   Ht 5\' 2"  (1.575 m)   Wt 139 lb 6.4 oz (63.2 kg)   LMP 01/27/2019   SpO2 100%   BMI 25.50 kg/m   General: Alert, NAD HEENT: NCAT, MMM Lungs: No increased WOB  Pelvic exam: VULVA: normal appearing vulva with no masses, tenderness or lesions, VAGINA: vaginal discharge - white, copious, creamy and malodorous, CERVIX: normal appearing cervix w/o lesions, no cervical motion tenderness, UTERUS: uterus is normal size, shape, consistency and nontender, ADNEXA: normal adnexa in size, nontender and no masses.  ASSESSMENT/PLAN:   BV (bacterial vaginosis) Clinical exam and wet prep consistent with BV.  Rx'd MetroGel x5 days (endorses N/V with oral metronidazole) and posttreatment Diflucan.  Educated on preventative measures including loose clothing,  appropriate hydration, and avoiding soap/douching within vaginal canal.  Gc/Ch collected and pending.   General counselling and advice on contraception 01/29/2019 and not currently on contraception.  Normal cycles, LMP 2 weeks ago with negative urine pregnancy test today.  Discussed reinitiating contraception, however patient has had numerous failed trials of several contraceptive methods and is hesitant.  She is considering having a tubal ligation, however does not want to proceed with this currently due to her family thoughts.  Discussed alternative contraception available and follow-up when she decides to proceed with therapy.  Adamantly encouraged condom use in the interim.    Follow-up to further discuss initiating contraception or if discharge not improving.  [id number], DO [facility]

## 2019-09-15 LAB — CERVICOVAGINAL ANCILLARY ONLY
Chlamydia: NEGATIVE
Comment: NEGATIVE
Comment: NEGATIVE
Comment: NORMAL
Neisseria Gonorrhea: NEGATIVE
Trichomonas: NEGATIVE

## 2019-09-21 DIAGNOSIS — Z03818 Encounter for observation for suspected exposure to other biological agents ruled out: Secondary | ICD-10-CM | POA: Diagnosis not present

## 2019-10-04 DIAGNOSIS — Z03818 Encounter for observation for suspected exposure to other biological agents ruled out: Secondary | ICD-10-CM | POA: Diagnosis not present

## 2019-10-18 DIAGNOSIS — Z03818 Encounter for observation for suspected exposure to other biological agents ruled out: Secondary | ICD-10-CM | POA: Diagnosis not present

## 2019-10-24 ENCOUNTER — Ambulatory Visit: Payer: Medicaid Other

## 2019-10-28 DIAGNOSIS — U071 COVID-19: Secondary | ICD-10-CM | POA: Diagnosis not present

## 2019-11-01 DIAGNOSIS — Z03818 Encounter for observation for suspected exposure to other biological agents ruled out: Secondary | ICD-10-CM | POA: Diagnosis not present

## 2019-11-15 DIAGNOSIS — Z03818 Encounter for observation for suspected exposure to other biological agents ruled out: Secondary | ICD-10-CM | POA: Diagnosis not present

## 2019-11-29 DIAGNOSIS — Z03818 Encounter for observation for suspected exposure to other biological agents ruled out: Secondary | ICD-10-CM | POA: Diagnosis not present

## 2019-12-13 DIAGNOSIS — Z03818 Encounter for observation for suspected exposure to other biological agents ruled out: Secondary | ICD-10-CM | POA: Diagnosis not present

## 2019-12-13 NOTE — Progress Notes (Signed)
    SUBJECTIVE:   CHIEF COMPLAINT / HPI:   Abdominal cramping and vaginal bleeding since procedure: Patient is a pleasant 34 year old female who presents today to discuss abdominal cramping and bleeding which is occurred since her previous elective abortion. Noticed heavier bleeding last month. Her last abortion was between March and June. Normally has about 7 days of bleeding per month but since June has had 7-14 days of bleeding per month as well as passing clots. She is also experiencing more cramping during her periods. She states her last period lasted 7 days.   PERTINENT  PMH / PSH: Recent elective abortion.  OBJECTIVE:   BP 118/70   Pulse 74   Wt 136 lb (61.7 kg)   LMP 12/09/2019   SpO2 98%   Breastfeeding No   BMI 24.87 kg/m    General: NAD, pleasant, able to participate in exam Respiratory: No respiratory distress Skin: warm and dry, no rashes noted Psych: Normal affect and mood  ASSESSMENT/PLAN:   Irregular bleeding Assessment: 34 year old female with irregular menstrual bleeding since she had her previous elective abortion in June of this year.  Patient states that she normally bleeds for about 7 days/month and since June has had several months where she would have menses for 7 to 14 days.  She says this is also associated with increased cramping which feel like her normal period cramps but are somewhat more significant.  She states that her most recent menses lasted for 7 days and seemed more similar to her normal periods.  The fact that her most recent menstrual cycle is similar to her normal cycle length suggest that this may be improving from her previous elective abortion.  There is also the concern for generalized dysmenorrhea which may be unrelated to this procedure.  Will check CBC today and will have patient follow-up if her next menstrual cycle is prolonged Plan: -Urine pregnancy negative -We will check CBC today -If anemic will discuss iron  supplementation -Discussed with patient that even if her CBC is normal if she has a prolonged period over the next few months that I recommend her schedule and follow-up. -Discussed use of ibuprofen for cramping during her menstrual cycles to help with the discomfort -Discussed with patient that I do recommend her consider contraception.  Patient has had bad experiences with Depo as well as IUDs in the past and states that she has difficulty taking oral contraception on a regular basis.  Patient states that she plans make a follow-up appointment to further discuss contraception options.     Jackelyn Poling, DO [facility]    This note was prepared using Dragon voice recognition software and may include unintentional dictation errors due to the inherent limitations of voice recognition software.

## 2019-12-14 ENCOUNTER — Other Ambulatory Visit: Payer: Self-pay

## 2019-12-14 ENCOUNTER — Other Ambulatory Visit (HOSPITAL_COMMUNITY)
Admission: RE | Admit: 2019-12-14 | Discharge: 2019-12-14 | Disposition: A | Discharge status: Home / Self Care | Payer: Medicaid Other | Source: Ambulatory Visit | Attending: Family Medicine | Admitting: Family Medicine

## 2019-12-14 ENCOUNTER — Ambulatory Visit (INDEPENDENT_AMBULATORY_CARE_PROVIDER_SITE_OTHER): Payer: Medicaid Other | Admitting: Family Medicine

## 2019-12-14 VITALS — BP 118/70 | HR 74 | Wt 136.0 lb

## 2019-12-14 DIAGNOSIS — N926 Irregular menstruation, unspecified: Secondary | ICD-10-CM | POA: Insufficient documentation

## 2019-12-14 LAB — POCT URINE PREGNANCY: Preg Test, Ur: NEGATIVE

## 2019-12-14 NOTE — Patient Instructions (Addendum)
It was great to see you! Thank you for allowing me to participate in your care!  Our plans for today:  -We are checking a CBC to look for signs of anemia -I would like for you to take ibuprofen 400 mg every 6 hours when you have your next.  If you are having significant cramping.  I would like for you to monitor your next few periods to make sure that they have resumed to the normal 7-day length and certainly let us know if your your periods start becoming prolonged such as your previous ones have been. -I do recommend a follow-up appointment to discuss birth control options if you are interested in this.  We are checking some labs today, I will call you if they are abnormal will send you a MyChart message or a letter if they are normal.  If you do not hear about your labs in the next 2 weeks please let us know.  Take care and seek immediate care sooner if you develop any concerns.   Dr. Jackelyn Poling, DO [facility]

## 2019-12-14 NOTE — Assessment & Plan Note (Signed)
Assessment: 34 year old female with irregular menstrual bleeding since she had her previous elective abortion in June of this year.  Patient states that she normally bleeds for about 7 days/month and since June has had several months where she would have menses for 7 to 14 days.  She says this is also associated with increased cramping which feel like her normal period cramps but are somewhat more significant.  She states that her most recent menses lasted for 7 days and seemed more similar to her normal periods.  The fact that her most recent menstrual cycle is similar to her normal cycle length suggest that this may be improving from her previous elective abortion.  There is also the concern for generalized dysmenorrhea which may be unrelated to this procedure.  Will check CBC today and will have patient follow-up if her next menstrual cycle is prolonged Plan: -Urine pregnancy negative -We will check CBC today -If anemic will discuss iron supplementation -Discussed with patient that even if her CBC is normal if she has a prolonged period over the next few months that I recommend her schedule and follow-up. -Discussed use of ibuprofen for cramping during her menstrual cycles to help with the discomfort -Discussed with patient that I do recommend her consider contraception.  Patient has had bad experiences with Depo as well as IUDs in the past and states that she has difficulty taking oral contraception on a regular basis.  Patient states that she plans make a follow-up appointment to further discuss contraception options.

## 2019-12-15 LAB — URINE CYTOLOGY ANCILLARY ONLY
Chlamydia: NEGATIVE
Comment: NEGATIVE
Comment: NEGATIVE
Comment: NORMAL
Neisseria Gonorrhea: NEGATIVE
Trichomonas: NEGATIVE

## 2019-12-16 ENCOUNTER — Other Ambulatory Visit: Payer: Self-pay

## 2019-12-16 ENCOUNTER — Ambulatory Visit (INDEPENDENT_AMBULATORY_CARE_PROVIDER_SITE_OTHER): Payer: Medicaid Other | Admitting: Family Medicine

## 2019-12-16 DIAGNOSIS — N760 Acute vaginitis: Secondary | ICD-10-CM | POA: Diagnosis not present

## 2019-12-16 DIAGNOSIS — B9689 Other specified bacterial agents as the cause of diseases classified elsewhere: Secondary | ICD-10-CM | POA: Diagnosis not present

## 2019-12-16 DIAGNOSIS — N898 Other specified noninflammatory disorders of vagina: Secondary | ICD-10-CM

## 2019-12-16 DIAGNOSIS — N926 Irregular menstruation, unspecified: Secondary | ICD-10-CM | POA: Diagnosis not present

## 2019-12-16 MED ORDER — METRONIDAZOLE 0.75 % VA GEL: 1.0000 | Freq: Every day | VAGINAL | 2 refills | Status: DC

## 2019-12-16 MED ORDER — FLUCONAZOLE 150 MG PO TABS: 150.0000 mg | ORAL_TABLET | ORAL | 0 refills | Status: DC

## 2019-12-16 NOTE — Assessment & Plan Note (Signed)
Patient has symptoms consistent with her recurrent bacterial vaginosis.  No pelvic exam completed today per patient's request.  Urine cytology looking for GC/chlamydia and trichomonas were negative.  Pregnancy test 2 days ago was negative.  Prescription given for MetroGel for 5 days and posttreatment Diflucan.  Discussed prophylaxis with twice weekly metronidazole and patient is agreeable to this.  Refill sent for MetroGel and she will start the prophylactic doses in 2 weeks.  She will take 1 dose Monday and 1 dose either Thursday or Friday.  No further questions or concerns.

## 2019-12-16 NOTE — Patient Instructions (Signed)
It was great seeing you today.  I am sorry you are having these issues.  I have sent in a prescription for metronidazole gel which should be used nightly for 5 nights.  I also sent in a prescription for the Diflucan to help with possible yeast infection.  I put refills for the metronidazole in so that after you finish this treatment dose I want you to switch to taking it twice weekly for prophylaxis.  If you continue to get yeast infections after those doses we will have to switch the prescription to something else.  Please let me know if you have any issues, questions, concerns.  I hope you have a wonderful afternoon!   Bacterial Vaginosis  Bacterial vaginosis is an infection of the vagina. It happens when too many normal germs (healthy bacteria) grow in the vagina. This infection puts you at risk for infections from sex (STIs). Treating this infection can lower your risk for some STIs. You should also treat this if you are pregnant. It can cause your baby to be born early. Follow these instructions at home: Medicines  Take over-the-counter and prescription medicines only as told by your doctor.  Take or use your antibiotic medicine as told by your doctor. Do not stop taking or using it even if you start to feel better. General instructions  If you your sexual partner is a woman, tell her that you have this infection. She needs to get treatment if she has symptoms. If you have a female partner, he does not need to be treated.  During treatment: ? Avoid sex. ? Do not douche. ? Avoid alcohol as told. ? Avoid breastfeeding as told.  Drink enough fluid to keep your pee (urine) clear or pale yellow.  Keep your vagina and butt (rectum) clean. ? Wash the area with warm water every day. ? Wipe from front to back after you use the toilet.  Keep all follow-up visits as told by your doctor. This is important. Preventing this condition  Do not douche.  Use only warm water to wash around your  vagina.  Use protection when you have sex. This includes: ? Latex condoms. ? Dental dams.  Limit how many people you have sex with. It is best to only have sex with the same person (be monogamous).  Get tested for STIs. Have your partner get tested.  Wear underwear that is cotton or lined with cotton.  Avoid tight pants and pantyhose. This is most important in summer.  Do not use any products that have nicotine or tobacco in them. These include cigarettes and e-cigarettes. If you need help quitting, ask your doctor.  Do not use illegal drugs.  Limit how much alcohol you drink. Contact a doctor if:  Your symptoms do not get better, even after you are treated.  You have more discharge or pain when you pee (urinate).  You have a fever.  You have pain in your belly (abdomen).  You have pain with sex.  Your bleed from your vagina between periods. Summary  This infection happens when too many germs (bacteria) grow in the vagina.  Treating this condition can lower your risk for some infections from sex (STIs).  You should also treat this if you are pregnant. It can cause early (premature) birth.  Do not stop taking or using your antibiotic medicine even if you start to feel better. This information is not intended to replace advice given to you by your health care provider. Make sure  you discuss any questions you have with your health care provider. Document Revised: 12/12/2016 Document Reviewed: 09/15/2015 Elsevier Patient Education  2020 [employer].

## 2019-12-16 NOTE — Progress Notes (Signed)
    SUBJECTIVE:   CHIEF COMPLAINT / HPI:   Vaginal Discharge Patient reports that she has continued to have vaginal discharge since her previous visit 2 days ago.  Reports that it initially started with an odor and then the discharge increased and then she is now having itching.  Her STD checks for gonorrhea and chlamydia came back negative.  She reports history of multiple bacterial vaginosis infections and she feels that this is what it is.  She is adamant that she does not want a pelvic exam.  She is interested in a prophylactic treatment to prevent recurrent bacterial vaginosis.  PERTINENT  PMH / PSH: Recurrent bacterial vaginosis  OBJECTIVE:   BP 108/76   Pulse 97   Ht 5\' 2"  (1.575 m)   Wt 138 lb 3.2 oz (62.7 kg)   LMP 12/09/2019   SpO2 99%   BMI 25.28 kg/m   General: Well-appearing, no acute distress Respiratory: Normal work of breathing Cardiac: Regular rate and rhythm, no murmurs appreciated Abdomen: Soft, nontender, positive bowel sounds GU: Patient deferred GU exam at this time but understands that future visits may require pelvic exam.  ASSESSMENT/PLAN:   BV (bacterial vaginosis) Patient has symptoms consistent with her recurrent bacterial vaginosis.  No pelvic exam completed today per patient's request.  Urine cytology looking for GC/chlamydia and trichomonas were negative.  Pregnancy test 2 days ago was negative.  Prescription given for MetroGel for 5 days and posttreatment Diflucan.  Discussed prophylaxis with twice weekly metronidazole and patient is agreeable to this.  Refill sent for MetroGel and she will start the prophylactic doses in 2 weeks.  She will take 1 dose Monday and 1 dose either Thursday or Friday.  No further questions or concerns.    Wednesday, MD [facility]

## 2019-12-17 LAB — CBC
Hematocrit: 38.9 % (ref 34.0–46.6)
Hemoglobin: 13.1 g/dL (ref 11.1–15.9)
MCH: 31 pg (ref 26.6–33.0)
MCHC: 33.7 g/dL (ref 31.5–35.7)
MCV: 92 fL (ref 79–97)
Platelets: 305 10*3/uL (ref 150–450)
RBC: 4.23 x10E6/uL (ref 3.77–5.28)
RDW: 12.6 % (ref 11.7–15.4)
WBC: 5.7 10*3/uL (ref 3.4–10.8)

## 2019-12-27 DIAGNOSIS — Z03818 Encounter for observation for suspected exposure to other biological agents ruled out: Secondary | ICD-10-CM | POA: Diagnosis not present

## 2020-01-10 DIAGNOSIS — Z03818 Encounter for observation for suspected exposure to other biological agents ruled out: Secondary | ICD-10-CM | POA: Diagnosis not present

## 2020-01-11 ENCOUNTER — Encounter: Payer: Self-pay | Admitting: Family Medicine

## 2020-01-11 ENCOUNTER — Ambulatory Visit (INDEPENDENT_AMBULATORY_CARE_PROVIDER_SITE_OTHER): Payer: Medicaid Other | Admitting: Family Medicine

## 2020-01-11 ENCOUNTER — Other Ambulatory Visit: Payer: Self-pay

## 2020-01-11 VITALS — BP 100/62 | HR 105 | Ht 62.0 in | Wt 137.0 lb

## 2020-01-11 DIAGNOSIS — Z3009 Encounter for other general counseling and advice on contraception: Secondary | ICD-10-CM

## 2020-01-11 DIAGNOSIS — Z789 Other specified health status: Secondary | ICD-10-CM

## 2020-01-11 LAB — POCT URINE PREGNANCY: Preg Test, Ur: NEGATIVE

## 2020-01-11 NOTE — Progress Notes (Signed)
  Date of Visit: 01/11/2020   SUBJECTIVE:   HPI:  Rachel Lloyd presents today for a same day appointment to discuss retained condom.  Had intercourse this morning, condom fell off and they could not locate it. Recently tested for STDs earlier this month No abnormal discharge, bleeding, etc. Has not yet had period for December, it may be a little late She is not on any other forms of birth control and is not interested in other forms currently, though after discussion may be interested in nexplanon.  OBJECTIVE:   BP 100/62   Pulse (!) 105   Ht 5\' 2"  (1.575 m)   Wt 137 lb (62.1 kg)   SpO2 98%   BMI 25.06 kg/m  Gen: no acute distress, pleasant, cooperative HEENT: normocephalic, atraumatic  Lungs: normal work of breathing  Neuro: alert, speech normal, grossly nonfocal GU: Vagina is moist with normal appearing discharge. Condom easily viewed with speculum, grasped with ring forceps, and removed..   ASSESSMENT/PLAN:   Health maintenance:  -declines flu vaccine -has received dose #1 of pfizer vaccine -current on all other HM items including pap smear  Retained condom Removed today without complication  General counselling and advice on contraception Reviewed birth control options today, encouraged nexplanon. Patient will contemplate and schedule if she decides she wants this placed. Upreg negative today  J. [place], MD [facility]

## 2020-01-11 NOTE — Assessment & Plan Note (Signed)
Reviewed birth control options today, encouraged nexplanon. Patient will contemplate and schedule if she decides she wants this placed.

## 2020-01-11 NOTE — Patient Instructions (Signed)
Contraceptive Implant Information A contraceptive implant is a small, plastic rod that is inserted under the skin. The implant releases a hormone into the bloodstream that prevents pregnancy. Contraceptive implants can be effective for up to 3 years. They do not provide protection against STIs (sexually transmitted infections). How does the implant work? Contraceptive implants prevent pregnancy by releasing a small amount of progestin into the bloodstream. Progestin has similar effects to the hormone progesterone, which plays a role in menstrual periods and pregnancy. Progestin will:  Stop the ovaries from releasing eggs.  Thicken cervical mucus to prevent sperm from entering the cervix.  Thin out the lining of the uterus to prevent a fertilized egg from attaching to the wall of the uterus. What are the advantages of this form of birth control? The advantages of this form of birth control include the following:  It is very effective at preventing pregnancy.  It is effective for up to 3 years.  It can easily be removed.  It does not interfere with sex or daily activities.  It can be used when breastfeeding.  It can be used by women who cannot take estrogen.  The procedure to insert the device is quick.  Women can get pregnant shortly after removing the device. What are the disadvantages of this form of birth control? The disadvantages of this form of birth control include the following:  It can cause side effects, including: ? Irregular menstrual periods or bleeding. ? Headache. ? Weight gain. ? Acne. ? Breast tenderness. ? Abdomen (abdominal) pain. ? Mood changes, such as depression.  It does not protect against STIs.  You must make an office visit to have it inserted and removed by a trained clinician.  Inserting or removing the device can result in pain, scarring, and tissue or nerve damage (rare). How is this implant inserted? The procedure to insert an implant only  takes a few minutes. During the procedure:  Your upper arm will be numbed with a numbing medicine (local anesthetic).  The implant will be injected under the skin of your upper arm with a needle. After the procedure:  You may experience minor bruising, swelling, or discomfort at the insertion site. This should only last for a couple of days.  You may need to use another, non-hormonal contraceptive such as a condom for 7 days after the procedure. How is the implant removed? The implant should be removed after 3 years or as directed by your health care provider. The procedure to remove the implant only takes a few minutes. During this procedure:  Your upper arm will be numbed with a local anesthetic.  A small incision will be made near the implant.  The implant will be removed with a small pair of forceps. After the implant is removed:  The effect of the implant will wear off a few hours after removal. Most women will be able to get pregnant within 3 weeks of removal.  A new implant can be inserted as soon as the old one is removed, if desired.  You may experience minor bruising, swelling, or discomfort at the removal site. This should only last for a couple of days. Is this implant right for me? Your health care provider can help you determine whether you are good candidate for a contraceptive implant. Make sure to discuss the possible side effects with your health care provider. You should not get the implant if you:  Are pregnant.  Are allergic to any part of the implant.    Have a history of: ? Breast cancer. ? Unusual bleeding from the vagina. ? Heart disease. ? Stroke. ? Liver disease or tumors. ? Migraines. Summary  A contraceptive implant is a small, plastic rod that is inserted under the skin. The implant releases a hormone into the bloodstream that prevents pregnancy.  Contraceptive implants can be effective for up to 3 years.  The implant works by preventing  ovaries from releasing eggs, thickening the cervical mucus, and thinning the uterine wall.  This form of birth control is very effective at preventing pregnancy and can be inserted and removed quickly. Women can get pregnant shortly after the device is removed.  This form of birth control can cause some side effects, including weight gain, breast tenderness, headaches, irregular periods or bleeding, acne, abdominal pain, and depression. It does not provide protection against STIs (sexually transmitted infections). This information is not intended to replace advice given to you by your health care provider. Make sure you discuss any questions you have with your health care provider. Document Revised: 02/23/2018 Document Reviewed: 12/15/2015 Elsevier Patient Education  2020 [employer]  

## 2020-01-24 DIAGNOSIS — Z03818 Encounter for observation for suspected exposure to other biological agents ruled out: Secondary | ICD-10-CM | POA: Diagnosis not present

## 2020-01-25 DIAGNOSIS — F17200 Nicotine dependence, unspecified, uncomplicated: Secondary | ICD-10-CM | POA: Diagnosis not present

## 2020-01-27 DIAGNOSIS — F17200 Nicotine dependence, unspecified, uncomplicated: Secondary | ICD-10-CM | POA: Diagnosis not present

## 2020-01-30 DIAGNOSIS — F17200 Nicotine dependence, unspecified, uncomplicated: Secondary | ICD-10-CM | POA: Diagnosis not present

## 2020-02-01 DIAGNOSIS — F17200 Nicotine dependence, unspecified, uncomplicated: Secondary | ICD-10-CM | POA: Diagnosis not present

## 2020-02-03 DIAGNOSIS — F17200 Nicotine dependence, unspecified, uncomplicated: Secondary | ICD-10-CM | POA: Diagnosis not present

## 2020-02-06 DIAGNOSIS — F17200 Nicotine dependence, unspecified, uncomplicated: Secondary | ICD-10-CM | POA: Diagnosis not present

## 2020-02-07 DIAGNOSIS — Z03818 Encounter for observation for suspected exposure to other biological agents ruled out: Secondary | ICD-10-CM | POA: Diagnosis not present

## 2020-02-08 DIAGNOSIS — F17200 Nicotine dependence, unspecified, uncomplicated: Secondary | ICD-10-CM | POA: Diagnosis not present

## 2020-02-10 DIAGNOSIS — F17200 Nicotine dependence, unspecified, uncomplicated: Secondary | ICD-10-CM | POA: Diagnosis not present

## 2020-02-13 DIAGNOSIS — F17200 Nicotine dependence, unspecified, uncomplicated: Secondary | ICD-10-CM | POA: Diagnosis not present

## 2020-02-15 DIAGNOSIS — F17200 Nicotine dependence, unspecified, uncomplicated: Secondary | ICD-10-CM | POA: Diagnosis not present

## 2020-02-17 DIAGNOSIS — F17200 Nicotine dependence, unspecified, uncomplicated: Secondary | ICD-10-CM | POA: Diagnosis not present

## 2020-02-18 ENCOUNTER — Encounter (HOSPITAL_COMMUNITY): Payer: Self-pay | Admitting: Emergency Medicine

## 2020-02-18 ENCOUNTER — Other Ambulatory Visit: Payer: Self-pay

## 2020-02-18 DIAGNOSIS — N898 Other specified noninflammatory disorders of vagina: Secondary | ICD-10-CM | POA: Diagnosis not present

## 2020-02-18 DIAGNOSIS — Z91040 Latex allergy status: Secondary | ICD-10-CM | POA: Diagnosis not present

## 2020-02-18 DIAGNOSIS — L739 Follicular disorder, unspecified: Secondary | ICD-10-CM | POA: Diagnosis not present

## 2020-02-18 DIAGNOSIS — Z87891 Personal history of nicotine dependence: Secondary | ICD-10-CM | POA: Diagnosis not present

## 2020-02-18 DIAGNOSIS — N3000 Acute cystitis without hematuria: Secondary | ICD-10-CM | POA: Insufficient documentation

## 2020-02-18 DIAGNOSIS — R21 Rash and other nonspecific skin eruption: Secondary | ICD-10-CM | POA: Diagnosis present

## 2020-02-18 NOTE — ED Triage Notes (Signed)
Patient reports rash to vagina and and upper legs with itching and white vaginal discharge x2 days.

## 2020-02-19 ENCOUNTER — Emergency Department (HOSPITAL_COMMUNITY)
Admission: EM | Admit: 2020-02-19 | Discharge: 2020-02-19 | Disposition: A | Discharge status: Home / Self Care | Payer: Medicaid Other | Attending: Emergency Medicine | Admitting: Emergency Medicine

## 2020-02-19 DIAGNOSIS — N898 Other specified noninflammatory disorders of vagina: Secondary | ICD-10-CM

## 2020-02-19 DIAGNOSIS — L738 Other specified follicular disorders: Secondary | ICD-10-CM

## 2020-02-19 DIAGNOSIS — N3000 Acute cystitis without hematuria: Secondary | ICD-10-CM

## 2020-02-19 LAB — WET PREP, GENITAL
Clue Cells Wet Prep HPF POC: NONE SEEN
Sperm: NONE SEEN
Trich, Wet Prep: NONE SEEN
Yeast Wet Prep HPF POC: NONE SEEN

## 2020-02-19 LAB — RPR: RPR Ser Ql: NONREACTIVE

## 2020-02-19 LAB — URINALYSIS, ROUTINE W REFLEX MICROSCOPIC
Bilirubin Urine: NEGATIVE
Glucose, UA: NEGATIVE mg/dL
Ketones, ur: 5 mg/dL — AB
Nitrite: POSITIVE — AB
Protein, ur: NEGATIVE mg/dL
Specific Gravity, Urine: 1.029 (ref 1.005–1.030)
pH: 5 (ref 5.0–8.0)

## 2020-02-19 LAB — PREGNANCY, URINE: Preg Test, Ur: NEGATIVE

## 2020-02-19 LAB — HIV ANTIBODY (ROUTINE TESTING W REFLEX): HIV Screen 4th Generation wRfx: NONREACTIVE

## 2020-02-19 MED ORDER — CEPHALEXIN 500 MG PO CAPS: 500.0000 mg | ORAL_CAPSULE | Freq: Four times a day (QID) | ORAL | 0 refills | Status: AC

## 2020-02-19 MED ORDER — TRIAMCINOLONE ACETONIDE 0.1 % EX CREA: TOPICAL_CREAM | CUTANEOUS | 0 refills | Status: DC

## 2020-02-19 MED ORDER — CEPHALEXIN 500 MG PO CAPS
500.0000 mg | ORAL_CAPSULE | Freq: Once | ORAL | Status: AC
  Administered 2020-02-19: 500 mg via ORAL
  Filled 2020-02-19: qty 1

## 2020-02-19 NOTE — ED Notes (Signed)
Discharge paperwork reviewed with pt, including prescriptions.  Pt with no questions or concerns at time of discharge.  Ambulatory to ED exit.

## 2020-02-19 NOTE — ED Provider Notes (Signed)
[facility]-EMERGENCY DEPT Provider Note   CSN: [id number] Arrival date & time: 02/18/20  2153     History Chief Complaint  Patient presents with  . Rash  . Vaginal Discharge    Rachel Lloyd is a 35 y.o. female with a history of recurrent bacterial vaginosis, trichomoniasis, chlamydia who presents to the emergency department with a chief complaint of rash.  The patient reports that she developed a painful rash to her groin and extends to the bilateral upper thighs for 2 days. She characterizes the pain as aching, but not burning. Pain was worse when she applied a wet wipe to the area, but no other known aggravating or alleviating factors. There is associated itching. No red streaking, purulent drainage, fever, chills. She also reports that she has been having some thick, white vaginal discharge and dysuria for several days. No abdominal pain, back pain, fever, chills, nausea, vomiting, diarrhea, constipation, or hematuria. LMP was 01/18/2020.  She does note that she recently used a new razor.  No new soaps or lotion, but she does frequently use soap that has a fragrance.  She does not douche.  She has had a new sexual partner for little more than a month.  She is unsure if she could have an STD.    The history is provided by the patient and medical records. No language interpreter was used.       Past Medical History:  Diagnosis Date  . Chlamydia   . Hypertension    During second pregnancy  . Mental disorder 2010   bipolar  . Pneumonia   . Trichimoniasis     Patient Active Problem List   Diagnosis Date Noted  . BV (bacterial vaginosis) 09/13/2019  . General counselling and advice on contraception 09/13/2019  . No-show for appointment 03/17/2019  . Post-procedural fever 03/09/2019  . Abscess 03/08/2019  . Trichomonas infection 02/18/2019  . Contact dermatitis 02/18/2019  . Positive pregnancy test 10/06/2018  . Migraine with aura 09/01/2018  .  Irregular bleeding 02/03/2018  . 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 Vaginal discharge 05/21/2011  . Bipolar disorder (HCC) 03/18/2007    Past Surgical History:  Procedure Laterality Date  . INDUCED ABORTION    . MOUTH SURGERY     as a child  . WISDOM TOOTH EXTRACTION       OB History    Gravida  10   Para  4   Term  4   Preterm  0   AB  5   Living  4     SAB  1   IAB  4   Ectopic  0   Multiple  0   Live Births  4           Family History  Problem Relation Age of Onset  . Hypertension Mother   . Cancer Mother   . Fibroids Mother   . Healthy Father   . ADD / ADHD Brother   . Hypertension Maternal Grandmother   . Anesthesia problems Neg Hx     Social History   Tobacco Use  . Smoking status: Former Smoker    Types: Cigarettes    Quit date: 04/04/2010    Years since quitting: 9.8  . Smokeless tobacco: Never Used  Vaping Use  . Vaping Use: Never used  Substance Use Topics  . Alcohol use: Not Currently  . Drug use: Not Currently    Types: Marijuana, Cocaine  Home Medications Prior to Admission medications   Medication Sig Start Date End Date Taking? Authorizing Provider  cephALEXin (KEFLEX) 500 MG capsule Take 1 capsule (500 mg total) by mouth 4 (four) times daily for 5 days. 02/19/20 02/24/20 Yes Cherrise Occhipinti A, PA-C  triamcinolone (KENALOG) 0.1 % Apply a thin layer to the rash on the skin two times daily for no more than a week. 02/19/20  Yes Nil Bolser A, PA-C  acetaminophen (TYLENOL) 500 MG tablet Take 1 tablet (500 mg total) by mouth every 6 (six) hours as needed. 06/03/19   Avegno, Zachery Dakins, FNP  fluconazole (DIFLUCAN) 150 MG tablet Take 1 tablet (150 mg total) by mouth every 3 (three) days. 12/16/19   Derrel Nip, MD  metroNIDAZOLE (METROGEL VAGINAL) 0.75 % vaginal gel Place 1 Applicatorful vaginally at bedtime. Insert one applicator, at bedtime, for 5 nights.  After your 5 nights  start taking twice weekly 12/16/19   Derrel Nip, MD  predniSONE (DELTASONE) 10 MG tablet Take 2 tablets (20 mg total) by mouth daily. 06/03/19   Avegno, Zachery Dakins, FNP  Prenatal Vit-Fe Fumarate-FA (PRENATAL VITAMIN) 27-0.8 MG TABS Take 1 tablet by mouth daily. Patient not taking: Reported on 03/29/2019 03/17/19   Peggyann Shoals C, DO    Allergies    Penicillins and Latex  Review of Systems   Review of Systems  Constitutional: Negative for activity change, chills and fever.  HENT: Negative for congestion and sore throat.   Respiratory: Negative for cough, shortness of breath and wheezing.   Cardiovascular: Negative for chest pain and palpitations.  Gastrointestinal: Negative for abdominal pain, blood in stool, diarrhea, nausea and vomiting.  Genitourinary: Positive for dysuria and vaginal discharge. Negative for flank pain, frequency, hematuria, pelvic pain, urgency, vaginal bleeding and vaginal pain.  Musculoskeletal: Negative for back pain.  Skin: Positive for rash and wound.  Allergic/Immunologic: Negative for immunocompromised state.  Neurological: Negative for seizures, syncope, weakness, numbness and headaches.  Psychiatric/Behavioral: Negative for confusion.    Physical Exam Updated Vital Signs BP 103/68 (BP Location: Right Arm)   Pulse 98   Temp 98.1 F (36.7 C) (Oral)   Resp 16   Ht 5\' 2"  (1.575 m)   Wt 60.8 kg   LMP 01/18/2020 (Approximate)   SpO2 98%   BMI 24.51 kg/m   Physical Exam Vitals and nursing note reviewed. Exam conducted with a chaperone present.  Constitutional:      General: She is not in acute distress.    Appearance: She is not ill-appearing, toxic-appearing or diaphoretic.  HENT:     Head: Normocephalic.  Eyes:     Conjunctiva/sclera: Conjunctivae normal.  Cardiovascular:     Rate and Rhythm: Normal rate and regular rhythm.     Heart sounds: No murmur heard. No friction rub. No gallop.   Pulmonary:     Effort: Pulmonary effort is  normal. No respiratory distress.     Breath sounds: No stridor. No wheezing, rhonchi or rales.  Chest:     Chest wall: No tenderness.  Abdominal:     General: There is no distension.     Palpations: Abdomen is soft. There is no mass.     Tenderness: There is no abdominal tenderness. There is no right CVA tenderness, left CVA tenderness, guarding or rebound.     Hernia: No hernia is present.     Comments: Abdomen is soft, nontender, nondistended.  Genitourinary:    Pubic Area: Rash present.     Comments: Scattered circular,  erythematous lesions with central pustules or scaling.no induration.  No satellite lesions.  No bulla.  No desquamation.  No vesicles.  Noted to the bilateral groin and extending to the bilateral, medial superior thighs. The rash is only present where hair follicles are present.  The wound rash does not extend to the vaginal mucosa.  Chaperoned exam.  No cervical motion tenderness.  There is a moderate amount of thick, white vaginal discharge.  No adnexal tenderness or fullness bilaterally. Musculoskeletal:     Cervical back: Neck supple.  Lymphadenopathy:     Lower Body: No right inguinal adenopathy. No left inguinal adenopathy.  Skin:    General: Skin is warm.     Findings: No rash.  Neurological:     Mental Status: She is alert.  Psychiatric:        Behavior: Behavior normal.     ED Results / Procedures / Treatments   Labs (all labs ordered are listed, but only abnormal results are displayed) Labs Reviewed  WET PREP, GENITAL - Abnormal; Notable for the following components:      Result Value   WBC, Wet Prep HPF POC RARE (*)    All other components within normal limits  URINALYSIS, ROUTINE W REFLEX MICROSCOPIC - Abnormal; Notable for the following components:   Color, Urine AMBER (*)    APPearance HAZY (*)    Hgb urine dipstick MODERATE (*)    Ketones, ur 5 (*)    Nitrite POSITIVE (*)    Leukocytes,Ua TRACE (*)    Bacteria, UA MANY (*)    All other  components within normal limits  URINE CULTURE  PREGNANCY, URINE  RPR  HIV ANTIBODY (ROUTINE TESTING W REFLEX)  GC/CHLAMYDIA PROBE AMP ([place]) NOT AT [facility]    EKG None  Radiology No results found.  Procedures Procedures   Medications Ordered in ED Medications  cephALEXin (KEFLEX) capsule 500 mg (has no administration in time range)    ED Course  I have reviewed the triage vital signs and the nursing notes.  Pertinent labs & imaging results that were available during my care of the patient were reviewed by me and considered in my medical decision making (see chart for details).    MDM Rules/Calculators/A&P                          35 year old female with a history of recurrent bacterial vaginosis, trichomoniasis, chlamydia who presents to the emergency department with a rash to the groin for the last 2 days.  She notes that she shaved with a new razor prior to onset of the rash.  No constitutional symptoms.  She has also been having some vaginal discharge and does note that she has had a new sexual partner for the last month.   Vital signs are normal.  On exam, she has a rash that seems consistent with folliculitis barbae.  Rash is not consistent with intertrigo, HSV, Behcet's, Bartholin's cyst, or cellulitis.  Pelvic exam with moderate thick, white vaginal discharge, but no evidence of PID.  Labs have been reviewed and interpreted by me.  Wet prep is unremarkable.  GC chlamydia is pending.  However, based on pelvic exam, have a low suspicion for positive GC chlamydia.  Patient plans to follow-up with OB/GYN will defer treatment at this time.  She does note that these test are pending.  UA is concerning for infection and patient has been endorsing dysuria.  Will treat with Keflex  given diffuse folliculitis barbae.  She is also been encouraged to use warm compresses and has been given a course of triamcinolone ointment.  ER return precautions given.  She is hemodynamically  stable no acute distress.  Safe for discharge home with outpatient follow-up as indicated.  Final Clinical Impression(s) / ED Diagnoses Final diagnoses:  Folliculitis barbae  Acute cystitis without hematuria  Vaginal discharge    Rx / DC Orders ED Discharge Orders         Ordered    cephALEXin (KEFLEX) 500 MG capsule  4 times daily        02/19/20 0428    triamcinolone (KENALOG) 0.1 %        02/19/20 0428           Jayshon Dommer A, PA-C 02/19/20 0441    Dione Booze, MD 02/19/20 [phone]

## 2020-02-19 NOTE — ED Notes (Signed)
Pt did not want to be hooked to the cardiac monitor,sp02 or blood pressure cuff.

## 2020-02-19 NOTE — Discharge Instructions (Addendum)
Thank you for allowing me to care for you today in the Emergency Department.   Take 1 tablet of Keflex every 6 hours for the next 5 days.  Your first dose was given here.  This should treat the rash on the skin as well as the urinary tract infection.  You can also apply a thin layer of triamcinolone cream to the skin where the rash is two times daily.  Do not use this cream for more than a week.  You can place a warm compress to the skin where the rash is for 15 to 20 minutes up to 3-4 times a day.  Take 650 mg of Tylenol or 600 mg of ibuprofen with food every 6 hours for pain.  You can alternate between these 2 medications every 3 hours if your pain returns.  For instance, you can take Tylenol at noon, followed by a dose of ibuprofen at 3, followed by second dose of Tylenol and 6.  It is very important that you do not shave until the rash is healed.  I would recommend replacing your razor.  Please follow-up with OB/GYN if the vaginal discharge does not improve for the time that you finish the antibiotics.  Return to the emergency department if you have difficulty urinating, if you start having fevers, red streaking, significant swelling to the groin, especially after you have been on antibiotics for at least 48 to 72 hours, or other new, concerning symptoms.

## 2020-02-20 DIAGNOSIS — F17200 Nicotine dependence, unspecified, uncomplicated: Secondary | ICD-10-CM | POA: Diagnosis not present

## 2020-02-20 LAB — GC/CHLAMYDIA PROBE AMP (~~LOC~~) NOT AT ARMC
Chlamydia: NEGATIVE
Comment: NEGATIVE
Comment: NORMAL
Neisseria Gonorrhea: NEGATIVE

## 2020-02-21 DIAGNOSIS — Z03818 Encounter for observation for suspected exposure to other biological agents ruled out: Secondary | ICD-10-CM | POA: Diagnosis not present

## 2020-02-21 LAB — URINE CULTURE: Culture: >=100000 — AB

## 2020-02-22 ENCOUNTER — Telehealth (HOSPITAL_BASED_OUTPATIENT_CLINIC_OR_DEPARTMENT_OTHER): Payer: Self-pay

## 2020-02-22 DIAGNOSIS — F17200 Nicotine dependence, unspecified, uncomplicated: Secondary | ICD-10-CM | POA: Diagnosis not present

## 2020-02-22 NOTE — Telephone Encounter (Signed)
Post ED Visit - Positive Culture Follow-up  Culture report reviewed by antimicrobial stewardship pharmacist: [facility] []  , Pharm.D. []  Enzo Bi, Pharm.D., BCPS AQ-ID []  , Pharm.D., BCPS []  Celedonio Miyamoto, Pharm.D., BCPS []  [place], Garvin Fila.D., BCPS, AAHIVP []  , Pharm.D., BCPS, AAHIVP []  Georgina Pillion, PharmD, BCPS []  , PharmD, BCPS []  [place], PharmD, BCPS []  [address], PharmD []  , PharmD, BCPS []  Estella Husk, PharmD  Pharmacy Team []  Lysle Pearl, PharmD []  , PharmD []  Phillips Climes, PharmD []  , Rph []  Agapito Games) , PharmD []  Verlan Friends, PharmD []  , PharmD []  Mervyn Gay, PharmD []  , PharmD []  Vinnie Level, PharmD []  [facility], PharmD []  , PharmD []  Len Childs, PharmD X   , PharmD  Positive urine culture -> >/= 100,000 colonies/ml E Coli Treated with Cephalexin, organism sensitive to the same and no further patient follow-up is required at this time.  Greer Pickerel 02/22/2020, 10:03 AM

## 2020-02-24 DIAGNOSIS — F17200 Nicotine dependence, unspecified, uncomplicated: Secondary | ICD-10-CM | POA: Diagnosis not present

## 2020-02-27 DIAGNOSIS — F17200 Nicotine dependence, unspecified, uncomplicated: Secondary | ICD-10-CM | POA: Diagnosis not present

## 2020-02-29 DIAGNOSIS — F17200 Nicotine dependence, unspecified, uncomplicated: Secondary | ICD-10-CM | POA: Diagnosis not present

## 2020-03-02 DIAGNOSIS — F17200 Nicotine dependence, unspecified, uncomplicated: Secondary | ICD-10-CM | POA: Diagnosis not present

## 2020-03-05 DIAGNOSIS — F17200 Nicotine dependence, unspecified, uncomplicated: Secondary | ICD-10-CM | POA: Diagnosis not present

## 2020-03-06 DIAGNOSIS — Z03818 Encounter for observation for suspected exposure to other biological agents ruled out: Secondary | ICD-10-CM | POA: Diagnosis not present

## 2020-03-07 DIAGNOSIS — F17200 Nicotine dependence, unspecified, uncomplicated: Secondary | ICD-10-CM | POA: Diagnosis not present

## 2020-03-09 DIAGNOSIS — F17200 Nicotine dependence, unspecified, uncomplicated: Secondary | ICD-10-CM | POA: Diagnosis not present

## 2020-03-12 DIAGNOSIS — F17200 Nicotine dependence, unspecified, uncomplicated: Secondary | ICD-10-CM | POA: Diagnosis not present

## 2020-03-14 DIAGNOSIS — F17200 Nicotine dependence, unspecified, uncomplicated: Secondary | ICD-10-CM | POA: Diagnosis not present

## 2020-03-16 DIAGNOSIS — F17200 Nicotine dependence, unspecified, uncomplicated: Secondary | ICD-10-CM | POA: Diagnosis not present

## 2020-03-19 DIAGNOSIS — F17200 Nicotine dependence, unspecified, uncomplicated: Secondary | ICD-10-CM | POA: Diagnosis not present

## 2020-03-21 DIAGNOSIS — F17200 Nicotine dependence, unspecified, uncomplicated: Secondary | ICD-10-CM | POA: Diagnosis not present

## 2020-03-23 DIAGNOSIS — F17200 Nicotine dependence, unspecified, uncomplicated: Secondary | ICD-10-CM | POA: Diagnosis not present

## 2020-03-26 DIAGNOSIS — F17200 Nicotine dependence, unspecified, uncomplicated: Secondary | ICD-10-CM | POA: Diagnosis not present

## 2020-03-28 DIAGNOSIS — F17200 Nicotine dependence, unspecified, uncomplicated: Secondary | ICD-10-CM | POA: Diagnosis not present

## 2020-03-30 DIAGNOSIS — F17200 Nicotine dependence, unspecified, uncomplicated: Secondary | ICD-10-CM | POA: Diagnosis not present

## 2020-04-02 DIAGNOSIS — F17200 Nicotine dependence, unspecified, uncomplicated: Secondary | ICD-10-CM | POA: Diagnosis not present

## 2020-04-02 DIAGNOSIS — Z03818 Encounter for observation for suspected exposure to other biological agents ruled out: Secondary | ICD-10-CM | POA: Diagnosis not present

## 2020-04-03 DIAGNOSIS — F17200 Nicotine dependence, unspecified, uncomplicated: Secondary | ICD-10-CM | POA: Diagnosis not present

## 2020-04-04 DIAGNOSIS — F17200 Nicotine dependence, unspecified, uncomplicated: Secondary | ICD-10-CM | POA: Diagnosis not present

## 2020-04-05 DIAGNOSIS — F17200 Nicotine dependence, unspecified, uncomplicated: Secondary | ICD-10-CM | POA: Diagnosis not present

## 2020-04-06 DIAGNOSIS — F17200 Nicotine dependence, unspecified, uncomplicated: Secondary | ICD-10-CM | POA: Diagnosis not present

## 2020-04-07 DIAGNOSIS — F17200 Nicotine dependence, unspecified, uncomplicated: Secondary | ICD-10-CM | POA: Diagnosis not present

## 2020-04-09 DIAGNOSIS — F17200 Nicotine dependence, unspecified, uncomplicated: Secondary | ICD-10-CM | POA: Diagnosis not present

## 2020-04-09 DIAGNOSIS — Z03818 Encounter for observation for suspected exposure to other biological agents ruled out: Secondary | ICD-10-CM | POA: Diagnosis not present

## 2020-04-11 DIAGNOSIS — F17200 Nicotine dependence, unspecified, uncomplicated: Secondary | ICD-10-CM | POA: Diagnosis not present

## 2020-04-13 DIAGNOSIS — F17200 Nicotine dependence, unspecified, uncomplicated: Secondary | ICD-10-CM | POA: Diagnosis not present

## 2020-04-14 DIAGNOSIS — F17200 Nicotine dependence, unspecified, uncomplicated: Secondary | ICD-10-CM | POA: Diagnosis not present

## 2020-04-16 DIAGNOSIS — Z03818 Encounter for observation for suspected exposure to other biological agents ruled out: Secondary | ICD-10-CM | POA: Diagnosis not present

## 2020-04-16 DIAGNOSIS — F17200 Nicotine dependence, unspecified, uncomplicated: Secondary | ICD-10-CM | POA: Diagnosis not present

## 2020-04-18 DIAGNOSIS — F17200 Nicotine dependence, unspecified, uncomplicated: Secondary | ICD-10-CM | POA: Diagnosis not present

## 2020-04-20 DIAGNOSIS — F17200 Nicotine dependence, unspecified, uncomplicated: Secondary | ICD-10-CM | POA: Diagnosis not present

## 2020-04-23 DIAGNOSIS — Z03818 Encounter for observation for suspected exposure to other biological agents ruled out: Secondary | ICD-10-CM | POA: Diagnosis not present

## 2020-04-30 DIAGNOSIS — Z03818 Encounter for observation for suspected exposure to other biological agents ruled out: Secondary | ICD-10-CM | POA: Diagnosis not present

## 2020-05-07 DIAGNOSIS — Z03818 Encounter for observation for suspected exposure to other biological agents ruled out: Secondary | ICD-10-CM | POA: Diagnosis not present

## 2020-05-14 DIAGNOSIS — Z03818 Encounter for observation for suspected exposure to other biological agents ruled out: Secondary | ICD-10-CM | POA: Diagnosis not present

## 2020-05-21 DIAGNOSIS — Z03818 Encounter for observation for suspected exposure to other biological agents ruled out: Secondary | ICD-10-CM | POA: Diagnosis not present

## 2020-05-28 DIAGNOSIS — Z03818 Encounter for observation for suspected exposure to other biological agents ruled out: Secondary | ICD-10-CM | POA: Diagnosis not present

## 2020-06-04 DIAGNOSIS — Z03818 Encounter for observation for suspected exposure to other biological agents ruled out: Secondary | ICD-10-CM | POA: Diagnosis not present

## 2020-06-06 ENCOUNTER — Encounter: Payer: Self-pay | Admitting: *Deleted

## 2020-06-18 DIAGNOSIS — Z03818 Encounter for observation for suspected exposure to other biological agents ruled out: Secondary | ICD-10-CM | POA: Diagnosis not present

## 2020-06-25 DIAGNOSIS — Z03818 Encounter for observation for suspected exposure to other biological agents ruled out: Secondary | ICD-10-CM | POA: Diagnosis not present

## 2020-07-02 DIAGNOSIS — Z03818 Encounter for observation for suspected exposure to other biological agents ruled out: Secondary | ICD-10-CM | POA: Diagnosis not present

## 2020-07-04 ENCOUNTER — Other Ambulatory Visit: Payer: Self-pay

## 2020-07-04 DIAGNOSIS — N898 Other specified noninflammatory disorders of vagina: Secondary | ICD-10-CM

## 2020-07-05 MED ORDER — METRONIDAZOLE 0.75 % VA GEL: 1.0000 | Freq: Every day | VAGINAL | 2 refills | Status: DC

## 2020-07-09 DIAGNOSIS — Z03818 Encounter for observation for suspected exposure to other biological agents ruled out: Secondary | ICD-10-CM | POA: Diagnosis not present

## 2020-07-10 ENCOUNTER — Encounter: Payer: Self-pay | Admitting: Family Medicine

## 2020-07-10 ENCOUNTER — Other Ambulatory Visit: Payer: Self-pay

## 2020-07-10 ENCOUNTER — Ambulatory Visit (INDEPENDENT_AMBULATORY_CARE_PROVIDER_SITE_OTHER): Payer: Medicaid Other | Admitting: Family Medicine

## 2020-07-10 VITALS — Ht 62.0 in | Wt 137.6 lb

## 2020-07-10 DIAGNOSIS — Z3201 Encounter for pregnancy test, result positive: Secondary | ICD-10-CM

## 2020-07-10 LAB — POCT URINE PREGNANCY: Preg Test, Ur: POSITIVE — AB

## 2020-07-10 NOTE — Progress Notes (Signed)
    SUBJECTIVE:   CHIEF COMPLAINT / HPI:   Confirm pregnancy: Patient presents to clinic today with recent positive pregnancy test at home.  Patient has a history of irregular periods.  She states that her LMP was April 2022, she says they usually skip every other month.  No reports of vaginal bleeding, unusual vaginal discharge, or abdominal pain.  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 -We will collect initial OB labs, schedule patient for initial OB follow-up appointment, send for OB ultrasound, and collect OB urine culture. -Patient instructed to restart prenatal vitamin     05/29/2020, DO [facility]

## 2020-07-10 NOTE — Patient Instructions (Signed)
Thank you for coming in to see [place] today! Please see below to review our plan for today's visit:  1. We are checking some labs on you for your new pregnancy. We are also getting scheduled for an ultrasound.  2. Take your prenatal vitamin every day! 3. Please follow up with [place] for an initial appointment in 3-4 weeks.   Please call the clinic at [phone] if your symptoms worsen or you have any concerns. It was our pleasure to serve you!   Dr. Peggyann Shoals [facility]

## 2020-07-11 ENCOUNTER — Encounter: Payer: Self-pay | Admitting: Family Medicine

## 2020-07-11 LAB — OBSTETRIC PANEL, INCLUDING HIV
Antibody Screen: NEGATIVE
Basophils Absolute: 0.1 10*3/uL (ref 0.0–0.2)
Basos: 1 %
EOS (ABSOLUTE): 0.1 10*3/uL (ref 0.0–0.4)
Eos: 2 %
HIV Screen 4th Generation wRfx: NONREACTIVE
Hematocrit: 38.2 % (ref 34.0–46.6)
Hemoglobin: 12.6 g/dL (ref 11.1–15.9)
Hepatitis B Surface Ag: NEGATIVE
Immature Grans (Abs): 0 10*3/uL (ref 0.0–0.1)
Immature Granulocytes: 0 %
Lymphocytes Absolute: 2 10*3/uL (ref 0.7–3.1)
Lymphs: 32 %
MCH: 30.1 pg (ref 26.6–33.0)
MCHC: 33 g/dL (ref 31.5–35.7)
MCV: 91 fL (ref 79–97)
Monocytes Absolute: 0.4 10*3/uL (ref 0.1–0.9)
Monocytes: 6 %
Neutrophils Absolute: 3.7 10*3/uL (ref 1.4–7.0)
Neutrophils: 59 %
Platelets: 262 10*3/uL (ref 150–450)
RBC: 4.18 x10E6/uL (ref 3.77–5.28)
RDW: 13.2 % (ref 11.7–15.4)
RPR Ser Ql: NONREACTIVE
Rh Factor: POSITIVE
Rubella Antibodies, IGG: 6.5 index (ref >0.99)
WBC: 6.2 10*3/uL (ref 3.4–10.8)

## 2020-07-11 NOTE — Assessment & Plan Note (Signed)
Patient with positive pregnancy test at home and in the clinic today. -We will collect initial OB labs, schedule patient for initial OB follow-up appointment, send for OB ultrasound, and collect OB urine culture. -Patient instructed to restart prenatal vitamin

## 2020-07-12 ENCOUNTER — Encounter: Payer: Self-pay | Admitting: Family Medicine

## 2020-07-12 LAB — CULTURE, OB URINE

## 2020-07-12 LAB — URINE CULTURE, OB REFLEX

## 2020-07-14 ENCOUNTER — Inpatient Hospital Stay (HOSPITAL_COMMUNITY)
Admission: AD | Admit: 2020-07-14 | Discharge: 2020-07-14 | Discharge status: Against Medical Advice | Payer: Medicaid Other | Attending: Obstetrics & Gynecology | Admitting: Obstetrics & Gynecology

## 2020-07-14 ENCOUNTER — Inpatient Hospital Stay (HOSPITAL_COMMUNITY): Payer: Medicaid Other

## 2020-07-14 ENCOUNTER — Encounter (HOSPITAL_COMMUNITY): Payer: Self-pay | Admitting: Obstetrics & Gynecology

## 2020-07-14 ENCOUNTER — Other Ambulatory Visit: Payer: Self-pay

## 2020-07-14 DIAGNOSIS — Z3A01 Less than 8 weeks gestation of pregnancy: Secondary | ICD-10-CM | POA: Diagnosis not present

## 2020-07-14 DIAGNOSIS — O26891 Other specified pregnancy related conditions, first trimester: Secondary | ICD-10-CM | POA: Insufficient documentation

## 2020-07-14 DIAGNOSIS — Z87891 Personal history of nicotine dependence: Secondary | ICD-10-CM | POA: Insufficient documentation

## 2020-07-14 DIAGNOSIS — O3680X0 Pregnancy with inconclusive fetal viability, not applicable or unspecified: Secondary | ICD-10-CM | POA: Diagnosis not present

## 2020-07-14 DIAGNOSIS — N939 Abnormal uterine and vaginal bleeding, unspecified: Secondary | ICD-10-CM | POA: Diagnosis not present

## 2020-07-14 DIAGNOSIS — R1084 Generalized abdominal pain: Secondary | ICD-10-CM | POA: Insufficient documentation

## 2020-07-14 DIAGNOSIS — O4691 Antepartum hemorrhage, unspecified, first trimester: Secondary | ICD-10-CM

## 2020-07-14 DIAGNOSIS — Z5329 Procedure and treatment not carried out because of patient's decision for other reasons: Secondary | ICD-10-CM | POA: Diagnosis not present

## 2020-07-14 DIAGNOSIS — O209 Hemorrhage in early pregnancy, unspecified: Secondary | ICD-10-CM

## 2020-07-14 DIAGNOSIS — Z3A09 9 weeks gestation of pregnancy: Secondary | ICD-10-CM

## 2020-07-14 DIAGNOSIS — O26851 Spotting complicating pregnancy, first trimester: Secondary | ICD-10-CM | POA: Insufficient documentation

## 2020-07-14 LAB — URINALYSIS, ROUTINE W REFLEX MICROSCOPIC
Bilirubin Urine: NEGATIVE
Glucose, UA: NEGATIVE mg/dL
Ketones, ur: NEGATIVE mg/dL
Leukocytes,Ua: NEGATIVE
Nitrite: NEGATIVE
Protein, ur: NEGATIVE mg/dL
RBC / HPF: >50 RBC/hpf — ABNORMAL HIGH (ref 0–5)
Specific Gravity, Urine: 1.017 (ref 1.005–1.030)
pH: 6 (ref 5.0–8.0)

## 2020-07-14 LAB — CBC
HCT: 34.6 % — ABNORMAL LOW (ref 36.0–46.0)
Hemoglobin: 11.5 g/dL — ABNORMAL LOW (ref 12.0–15.0)
MCH: 30.1 pg (ref 26.0–34.0)
MCHC: 33.2 g/dL (ref 30.0–36.0)
MCV: 90.6 fL (ref 80.0–100.0)
Platelets: 235 10*3/uL (ref 150–400)
RBC: 3.82 MIL/uL — ABNORMAL LOW (ref 3.87–5.11)
RDW: 12.4 % (ref 11.5–15.5)
WBC: 5.9 10*3/uL (ref 4.0–10.5)
nRBC: 0 % (ref 0.0–0.2)

## 2020-07-14 LAB — WET PREP, GENITAL
Clue Cells Wet Prep HPF POC: NONE SEEN
Sperm: NONE SEEN
Trich, Wet Prep: NONE SEEN
Yeast Wet Prep HPF POC: NONE SEEN

## 2020-07-14 LAB — HCG, QUANTITATIVE, PREGNANCY: hCG, Beta Chain, Quant, S: 1325 m[IU]/mL — ABNORMAL HIGH (ref <5)

## 2020-07-14 NOTE — MAU Note (Signed)
Pt called out asking to leave AMA.  Form signed.  Provider notified

## 2020-07-14 NOTE — MAU Note (Addendum)
Saw reddish brown when wiped last night.  Started cramping this morning when wiped, 'toilet bowl full of blood, still seeing brown when wipes.  Cramping started on LLQ, now across lower abd, but holding left side.

## 2020-07-14 NOTE — MAU Provider Note (Addendum)
History     CSN: [id number]  Arrival date and time: 07/14/20 7262   Event Date/Time   First Provider Initiated Contact with Patient 07/14/20 1028      Chief Complaint  Patient presents with   Vaginal Bleeding   Abdominal Pain   HPI Rachel Lloyd is a 35 y.o. [id number] at unsure gestation who presents with abdominal pain and spotting. She reports for 2 days she has had abdominal pain on the left side of her abdomen. She reports last night the pain became more generalized to her lower abdomen and was waking her up out of her sleep. She also reports spotting that is brown. She reports she saw more of it this morning than she had the past 24 hours but states it is only when she wipes. She also reports vaginal itching but no foul odor or other abnormal discharge. She reports a hx of irregular periods and usually has a period every other month.   OB History     Gravida  11   Para  4   Term  4   Preterm  0   AB  5   Living  4      SAB  1   IAB  4   Ectopic  0   Multiple  0   Live Births  4           Past Medical History:  Diagnosis Date   Chlamydia    Hypertension    During second pregnancy   Mental disorder 2010   bipolar   Pneumonia    Trichimoniasis     Past Surgical History:  Procedure Laterality Date   INDUCED ABORTION     MOUTH SURGERY     as a child   WISDOM TOOTH EXTRACTION      Family History  Problem Relation Age of Onset   Hypertension Mother    Cancer Mother    Fibroids Mother    Healthy Father    ADD / ADHD Brother    Hypertension Maternal Grandmother    Anesthesia problems Neg Hx     Social History   Tobacco Use   Smoking status: Former    Pack years: 0.00    Types: Cigarettes    Quit date: 04/04/2010    Years since quitting: 10.2   Smokeless tobacco: Never  Vaping Use   Vaping Use: Never used  Substance Use Topics   Alcohol use: Not Currently   Drug use: Not Currently    Types: Marijuana, Cocaine    Allergies:   Allergies  Allergen Reactions   Penicillins Anaphylaxis    Has taken cephalosprorins Has patient had a PCN reaction causing immediate rash, facial/tongue/throat swelling, SOB or lightheadedness with hypotension: Yes Has patient had a PCN reaction causing severe rash involving mucus membranes or skin necrosis: Yes Has patient had a PCN reaction that required hospitalization Yes Has patient had a PCN reaction occurring within the last 10 years: No If all of the above answers are "NO", then may proceed with Cephalosporin use.   Latex Itching and Rash    No medications prior to admission.    Review of Systems  Constitutional: Negative.  Negative for chills, fatigue and fever.  HENT: Negative.    Respiratory: Negative.  Negative for shortness of breath.   Cardiovascular: Negative.  Negative for chest pain.  Gastrointestinal:  Positive for abdominal pain. Negative for constipation, diarrhea, nausea and vomiting.  Genitourinary:  Positive for vaginal discharge. Negative  for dysuria and vaginal bleeding.  Neurological: Negative.  Negative for dizziness and headaches.  Physical Exam   Blood pressure 109/78, pulse 85, temperature 98.3 F (36.8 C), temperature source Oral, resp. rate 18, height 5\' 2"  (1.575 m), weight 62.4 kg, last menstrual period 05/07/2020, SpO2 100 %.  Physical Exam Vitals and nursing note reviewed.  Constitutional:      General: She is not in acute distress.    Appearance: She is well-developed.  HENT:     Head: Normocephalic.  Eyes:     Pupils: Pupils are equal, round, and reactive to light.  Cardiovascular:     Rate and Rhythm: Normal rate and regular rhythm.     Heart sounds: Normal heart sounds.  Pulmonary:     Effort: Pulmonary effort is normal. No respiratory distress.     Breath sounds: Normal breath sounds.  Abdominal:     General: Bowel sounds are normal. There is no distension.     Palpations: Abdomen is soft.     Tenderness: There is no abdominal  tenderness.  Skin:    General: Skin is warm and dry.  Neurological:     Mental Status: She is alert and oriented to person, place, and time.  Psychiatric:        Mood and Affect: Mood normal.        Behavior: Behavior normal.        Thought Content: Thought content normal.        Judgment: Judgment normal.    MAU Course  Procedures Results for orders placed or performed during the hospital encounter of 07/14/20 (from the past 24 hour(s))  CBC     Status: Abnormal   Collection Time: 07/14/20 10:23 AM  Result Value Ref Range   WBC 5.9 4.0 - 10.5 K/uL   RBC 3.82 (L) 3.87 - 5.11 MIL/uL   Hemoglobin 11.5 (L) 12.0 - 15.0 g/dL   HCT 09/14/20 (L) 22.0 - 25.4 %   MCV 90.6 80.0 - 100.0 fL   MCH 30.1 26.0 - 34.0 pg   MCHC 33.2 30.0 - 36.0 g/dL   RDW 27.0 62.3 - 76.2 %   Platelets 235 150 - 400 K/uL   nRBC 0.0 0.0 - 0.2 %  hCG, quantitative, pregnancy     Status: Abnormal   Collection Time: 07/14/20 10:23 AM  Result Value Ref Range   hCG, Beta Chain, Quant, S 1,325 (H) <5 mIU/mL  Wet prep, genital     Status: Abnormal   Collection Time: 07/14/20 11:12 AM   Specimen: Cervix  Result Value Ref Range   Yeast Wet Prep HPF POC NONE SEEN NONE SEEN   Trich, Wet Prep NONE SEEN NONE SEEN   Clue Cells Wet Prep HPF POC NONE SEEN NONE SEEN   WBC, Wet Prep HPF POC MODERATE (A) NONE SEEN   Sperm NONE SEEN   Urinalysis, Routine w reflex microscopic Urine, Clean Catch     Status: Abnormal   Collection Time: 07/14/20 11:12 AM  Result Value Ref Range   Color, Urine YELLOW YELLOW   APPearance HAZY (A) CLEAR   Specific Gravity, Urine 1.017 1.005 - 1.030   pH 6.0 5.0 - 8.0   Glucose, UA NEGATIVE NEGATIVE mg/dL   Hgb urine dipstick LARGE (A) NEGATIVE   Bilirubin Urine NEGATIVE NEGATIVE   Ketones, ur NEGATIVE NEGATIVE mg/dL   Protein, ur NEGATIVE NEGATIVE mg/dL   Nitrite NEGATIVE NEGATIVE   Leukocytes,Ua NEGATIVE NEGATIVE   RBC / HPF >50 (H) 0 -  5 RBC/hpf   WBC, UA 6-10 0 - 5 WBC/hpf   Bacteria, UA  RARE (A) NONE SEEN   Squamous Epithelial / LPF 0-5 0 - 5   Mucus PRESENT      US OB LESS THAN 14 WEEKS WITH OB TRANSVAGINAL  Result Date: 07/14/2020 CLINICAL DATA:  Vaginal bleeding. EXAM: OBSTETRIC <14 WK [place] AND TRANSVAGINAL OB US TECHNIQUE: Both transabdominal and transvaginal ultrasound examinations were performed for complete evaluation of the gestation as well as the maternal uterus, adnexal regions, and pelvic cul-de-sac. Transvaginal technique was performed to assess early pregnancy. COMPARISON:  None. FINDINGS: Intrauterine gestational sac: Single Yolk sac:  Not Visualized. Embryo:  Not Visualized. Cardiac Activity: Not Visualized. MSD: 3.4 mm mm   5 w   0 d Maternal uterus/adnexae: Subchorionic hemorrhage: None visualized. Right ovary: Appears normal. Left ovary: Appears normal Other :Within the right adnexa there is an elongated, tubular appearing 7.6 x 6.7 x 3.0 cm hypoechoic mass which appears separate from the uterus and right ovary. No significant increased blood flow identified within this structure. No signs of a gestational sac within this mass. Small scattered hypoechoic structure foci noted internally. Free fluid:  No free fluid. IMPRESSION: 1. Probable early intrauterine gestational sac, but no yolk sac, fetal pole, or cardiac activity yet visualized. Recommend follow-up quantitative B-HCG levels and follow-up US in 14 days to confirm and assess viability. This recommendation follows SRU consensus guidelines: Diagnostic Criteria for Nonviable Pregnancy Early in the First Trimester. Malva Limes Med 2013; 270:3500-93. 2. Indeterminate, elongated and tubular appearing solid-appearing hypoechoic mass noted within the right adnexa. Not seen on previous imaging. Potential diagnostic considerations include complex hydrosalpinx versus endometrioma. Attention to this area at follow-up imaging is advised. Electronically Signed   By: Signa Kell M.D.   On: 07/14/2020 12:42     MDM UA, UPT CBC,  HCG ABO/Rh- A Pos Wet prep and gc/chlamydia US OB Comp Less 14 weeks with Transvaginal  Reviewed imaging with Dr. Macon Large- ok to bring patient back for repeat HCG in 48 hours.  Patient left AMA before results of labs and ultrasound resulted. Attempted to call patient to discuss plan of care and results without answer.   Assessment and Plan   1. Pregnancy of unknown anatomic location   2. Vaginal bleeding affecting early pregnancy   3. [redacted] weeks gestation of pregnancy   4. Left against medical advice    -Left AMA -Signed and held orders placed for 7/4 in event patient gets messages and comes for repeat HCG.   Rolm Bookbinder CNM 07/14/2020, 1:10 PM

## 2020-07-17 ENCOUNTER — Telehealth: Payer: Self-pay | Admitting: Family Medicine

## 2020-07-17 DIAGNOSIS — Z03818 Encounter for observation for suspected exposure to other biological agents ruled out: Secondary | ICD-10-CM | POA: Diagnosis not present

## 2020-07-17 LAB — GC/CHLAMYDIA PROBE AMP (~~LOC~~) NOT AT ARMC
Chlamydia: NEGATIVE
Comment: NEGATIVE
Comment: NORMAL
Neisseria Gonorrhea: NEGATIVE

## 2020-07-17 NOTE — Telephone Encounter (Signed)
Attempted to call patient to discuss labs. No answer. LVM to call back. She is Rh + and this is a just a protein found on the blood; nothing to do. For her urine she should have had a urine culture to determine is she would need to be treated for UTI; I do not see this was done or resulted.   Hersh Minney Autry-Lott, DO 07/17/2020, 8:57 AM PGY-3, [facility]

## 2020-07-17 NOTE — Telephone Encounter (Signed)
Patient calls nurse line regarding follow up from MAU visit from 07/14/20. Per MAU note, patient was to follow up with beta HCG and ultrasound in 14 days. Patient is requesting to have labs done in our office, as to avoid going back to MAU if possible. Patient denies abdominal pain at this time and is only having "mild spotting."   Advised patient of return MAU precautions. Please advise if patient can obtain labs through our office.   Veronda Prude, RN

## 2020-07-18 ENCOUNTER — Telehealth: Payer: Self-pay | Admitting: Family Medicine

## 2020-07-18 NOTE — Telephone Encounter (Signed)
Patient came in was seen by Dr Dareen Piano on 07/10/20.  She needs letter from provider that it's ok for her to work.  Doctor she saw in June is no longer with [facility].  Can you write letter for patient.  Any questions please call patient P#[phone].  Thanks. According to her she was never told to stay out of work she continued to work the whole.

## 2020-07-19 NOTE — Telephone Encounter (Signed)
We can do a betaHCG in the office and she is already scheduled for her follow up ultrasound.   Franchesca Veneziano Autry-Lott, DO 07/19/2020, 5:06 PM PGY-3, [facility]

## 2020-07-19 NOTE — Telephone Encounter (Signed)
Letter sent to patient's Mychart. Please notify her. Thank you for your help with this patient.   Lavonda Jumbo, DO 07/19/2020, 5:10 PM PGY-3, [facility]

## 2020-07-20 ENCOUNTER — Inpatient Hospital Stay (HOSPITAL_COMMUNITY)
Admission: AD | Admit: 2020-07-20 | Discharge: 2020-07-20 | Disposition: A | Discharge status: Home / Self Care | Payer: Medicaid Other | Attending: Obstetrics & Gynecology | Admitting: Obstetrics & Gynecology

## 2020-07-20 ENCOUNTER — Inpatient Hospital Stay (HOSPITAL_COMMUNITY): Payer: Medicaid Other

## 2020-07-20 ENCOUNTER — Other Ambulatory Visit: Payer: Self-pay

## 2020-07-20 ENCOUNTER — Encounter (HOSPITAL_COMMUNITY): Payer: Self-pay | Admitting: Obstetrics & Gynecology

## 2020-07-20 DIAGNOSIS — O039 Complete or unspecified spontaneous abortion without complication: Secondary | ICD-10-CM | POA: Insufficient documentation

## 2020-07-20 DIAGNOSIS — B373 Candidiasis of vulva and vagina: Secondary | ICD-10-CM | POA: Diagnosis not present

## 2020-07-20 DIAGNOSIS — Z880 Allergy status to penicillin: Secondary | ICD-10-CM | POA: Diagnosis not present

## 2020-07-20 DIAGNOSIS — O23591 Infection of other part of genital tract in pregnancy, first trimester: Secondary | ICD-10-CM | POA: Insufficient documentation

## 2020-07-20 DIAGNOSIS — Z87891 Personal history of nicotine dependence: Secondary | ICD-10-CM | POA: Diagnosis not present

## 2020-07-20 DIAGNOSIS — B9689 Other specified bacterial agents as the cause of diseases classified elsewhere: Secondary | ICD-10-CM | POA: Insufficient documentation

## 2020-07-20 DIAGNOSIS — O26891 Other specified pregnancy related conditions, first trimester: Secondary | ICD-10-CM

## 2020-07-20 DIAGNOSIS — Z3A10 10 weeks gestation of pregnancy: Secondary | ICD-10-CM | POA: Insufficient documentation

## 2020-07-20 DIAGNOSIS — R109 Unspecified abdominal pain: Secondary | ICD-10-CM | POA: Diagnosis not present

## 2020-07-20 DIAGNOSIS — Z3A00 Weeks of gestation of pregnancy not specified: Secondary | ICD-10-CM | POA: Diagnosis not present

## 2020-07-20 LAB — COMPREHENSIVE METABOLIC PANEL
ALT: 13 U/L (ref 0–44)
AST: 18 U/L (ref 15–41)
Albumin: 3.9 g/dL (ref 3.5–5.0)
Alkaline Phosphatase: 42 U/L (ref 38–126)
Anion gap: 6 (ref 5–15)
BUN: 14 mg/dL (ref 6–20)
CO2: 22 mmol/L (ref 22–32)
Calcium: 9.1 mg/dL (ref 8.9–10.3)
Chloride: 108 mmol/L (ref 98–111)
Creatinine, Ser: 0.95 mg/dL (ref 0.44–1.00)
GFR, Estimated: >60 mL/min (ref >60)
Glucose, Bld: 97 mg/dL (ref 70–99)
Potassium: 4.3 mmol/L (ref 3.5–5.1)
Sodium: 136 mmol/L (ref 135–145)
Total Bilirubin: 0.5 mg/dL (ref 0.3–1.2)
Total Protein: 6.7 g/dL (ref 6.5–8.1)

## 2020-07-20 LAB — CBC
HCT: 35.8 % — ABNORMAL LOW (ref 36.0–46.0)
Hemoglobin: 12.2 g/dL (ref 12.0–15.0)
MCH: 30.5 pg (ref 26.0–34.0)
MCHC: 34.1 g/dL (ref 30.0–36.0)
MCV: 89.5 fL (ref 80.0–100.0)
Platelets: 256 10*3/uL (ref 150–400)
RBC: 4 MIL/uL (ref 3.87–5.11)
RDW: 12.2 % (ref 11.5–15.5)
WBC: 7.4 10*3/uL (ref 4.0–10.5)
nRBC: 0 % (ref 0.0–0.2)

## 2020-07-20 LAB — URINALYSIS, ROUTINE W REFLEX MICROSCOPIC
Bilirubin Urine: NEGATIVE
Glucose, UA: NEGATIVE mg/dL
Ketones, ur: 5 mg/dL — AB
Leukocytes,Ua: NEGATIVE
Nitrite: NEGATIVE
Protein, ur: NEGATIVE mg/dL
Specific Gravity, Urine: 1.029 (ref 1.005–1.030)
pH: 6 (ref 5.0–8.0)

## 2020-07-20 LAB — HCG, QUANTITATIVE, PREGNANCY: hCG, Beta Chain, Quant, S: 253 m[IU]/mL — ABNORMAL HIGH (ref <5)

## 2020-07-20 LAB — WET PREP, GENITAL
Sperm: NONE SEEN
Trich, Wet Prep: NONE SEEN
Yeast Wet Prep HPF POC: NONE SEEN

## 2020-07-20 MED ORDER — METRONIDAZOLE 0.75 % VA GEL: 1.0000 | Freq: Two times a day (BID) | VAGINAL | 0 refills | Status: DC

## 2020-07-20 MED ORDER — OXYCODONE HCL 5 MG PO TABS
10.0000 mg | ORAL_TABLET | Freq: Once | ORAL | Status: AC
  Administered 2020-07-20: 10 mg via ORAL
  Filled 2020-07-20: qty 2

## 2020-07-20 MED ORDER — NORGESTIMATE-ETH ESTRADIOL 0.25-35 MG-MCG PO TABS: 1.0000 | ORAL_TABLET | Freq: Every day | ORAL | 1 refills | Status: DC

## 2020-07-20 MED ORDER — IBUPROFEN 600 MG PO TABS: 600.0000 mg | ORAL_TABLET | Freq: Four times a day (QID) | ORAL | 0 refills | Status: DC | PRN

## 2020-07-20 NOTE — MAU Note (Signed)
Rachel Lloyd is a 35 y.o. at [redacted]w[redacted]d here in MAU reporting: was here over the weekend and was supposed to return on Monday for repeat hcg but felt like she was okay so she didn't come. Has continued to have vaginal bleeding when she wipes. Also having right lower abdominal pain. Also reports vaginal odor, "smells like something is dead".  Onset of complaint: ongoing  Pain score: 8/10  Vitals:   07/20/20 1657  BP: 109/65  Pulse: 98  Resp: 16  Temp: 98.2 F (36.8 C)  SpO2: 98%     Lab orders placed from triage: none

## 2020-07-20 NOTE — Telephone Encounter (Signed)
Please place future orders for beta hcg, once completed I will call patient to schedule lab appointment.   Veronda Prude, RN

## 2020-07-20 NOTE — MAU Provider Note (Signed)
History     CSN: [id number]  Arrival date and time: 07/20/20 1601   Event Date/Time   First Provider Initiated Contact with Patient 07/20/20 1724      Chief Complaint  Patient presents with   Abdominal Pain   Vaginal Bleeding   HPI Rachel Lloyd is a 35 y.o. [id number] at [redacted]w[redacted]d who presents with abdominal pain. Was seen in MAU last week for same - left AMA prior to receiving results. Had an ultrasound that showed no IUP but had a right tubular adnexal mass - recommended for a repeat HCG. Reports worsening abdominal pain today - primarily in her right lower quadrant. Rates pain 8/10. Hasn't treated symptoms. Nothing makes better or worse. Vaginal bleeding has continued but is red spotting - not saturating pads or passing clots. Also reports a vaginal discharge that has a foul odor. Denies fever/chills, n/v/d, dysuria.   OB History     Gravida  10   Para  4   Term  4   Preterm  0   AB  5   Living  4      SAB  1   IAB  4   Ectopic  0   Multiple  0   Live Births  4           Past Medical History:  Diagnosis Date   Chlamydia    Hypertension    During second pregnancy   Mental disorder 2010   bipolar   Pneumonia    Trichimoniasis     Past Surgical History:  Procedure Laterality Date   INDUCED ABORTION     MOUTH SURGERY     as a child   WISDOM TOOTH EXTRACTION      Family History  Problem Relation Age of Onset   Hypertension Mother    Cancer Mother    Fibroids Mother    Healthy Father    ADD / ADHD Brother    Hypertension Maternal Grandmother    Anesthesia problems Neg Hx     Social History   Tobacco Use   Smoking status: Former    Pack years: 0.00    Types: Cigarettes    Quit date: 04/04/2010    Years since quitting: 10.3   Smokeless tobacco: Never  Vaping Use   Vaping Use: Never used  Substance Use Topics   Alcohol use: Not Currently   Drug use: Not Currently    Types: Marijuana, Cocaine    Allergies:  Allergies  Allergen  Reactions   Penicillins Anaphylaxis    Has taken cephalosprorins Has patient had a PCN reaction causing immediate rash, facial/tongue/throat swelling, SOB or lightheadedness with hypotension: Yes Has patient had a PCN reaction causing severe rash involving mucus membranes or skin necrosis: Yes Has patient had a PCN reaction that required hospitalization Yes Has patient had a PCN reaction occurring within the last 10 years: No If all of the above answers are "NO", then may proceed with Cephalosporin use.   Latex Itching and Rash    Medications Prior to Admission  Medication Sig Dispense Refill Last Dose   acetaminophen (TYLENOL) 500 MG tablet Take 1 tablet (500 mg total) by mouth every 6 (six) hours as needed. 30 tablet 0 07/20/2020   Prenatal Vit-Fe Fumarate-FA (PRENATAL VITAMIN) 27-0.8 MG TABS Take 1 tablet by mouth daily. (Patient not taking: Reported on 03/29/2019) 90 tablet 3     Review of Systems  Constitutional: Negative.   Gastrointestinal:  Positive for abdominal pain. Negative  for constipation, diarrhea, nausea and vomiting.  Genitourinary:  Positive for vaginal bleeding and vaginal discharge. Negative for dysuria.  Physical Exam   Blood pressure 109/65, pulse 98, temperature 98.2 F (36.8 C), temperature source Oral, resp. rate 16, height 5\' 2"  (1.575 m), weight 62.7 kg, last menstrual period 05/07/2020, SpO2 98 %.  Physical Exam Vitals and nursing note reviewed.  Constitutional:      General: She is in acute distress.     Appearance: She is well-developed and normal weight.  HENT:     Head: Normocephalic and atraumatic.  Eyes:     General: No scleral icterus. Abdominal:     General: Abdomen is flat. There is no distension.     Palpations: Abdomen is soft.     Tenderness: There is no abdominal tenderness. There is no guarding or rebound.  Skin:    General: Skin is warm and dry.  Neurological:     Mental Status: She is alert.  Psychiatric:        Mood and Affect:  Mood normal.        Behavior: Behavior normal.    MAU Course  Procedures Results for orders placed or performed during the hospital encounter of 07/20/20 (from the past 24 hour(s))  Urinalysis, Routine w reflex microscopic Urine, Clean Catch     Status: Abnormal   Collection Time: 07/20/20  5:12 PM  Result Value Ref Range   Color, Urine YELLOW YELLOW   APPearance HAZY (A) CLEAR   Specific Gravity, Urine 1.029 1.005 - 1.030   pH 6.0 5.0 - 8.0   Glucose, UA NEGATIVE NEGATIVE mg/dL   Hgb urine dipstick MODERATE (A) NEGATIVE   Bilirubin Urine NEGATIVE NEGATIVE   Ketones, ur 5 (A) NEGATIVE mg/dL   Protein, ur NEGATIVE NEGATIVE mg/dL   Nitrite NEGATIVE NEGATIVE   Leukocytes,Ua NEGATIVE NEGATIVE   RBC / HPF 6-10 0 - 5 RBC/hpf   WBC, UA 0-5 0 - 5 WBC/hpf   Bacteria, UA RARE (A) NONE SEEN   Squamous Epithelial / LPF 6-10 0 - 5   Mucus PRESENT   CBC     Status: Abnormal   Collection Time: 07/20/20  5:34 PM  Result Value Ref Range   WBC 7.4 4.0 - 10.5 K/uL   RBC 4.00 3.87 - 5.11 MIL/uL   Hemoglobin 12.2 12.0 - 15.0 g/dL   HCT 09/20/20 (L) 03.4 - 74.2 %   MCV 89.5 80.0 - 100.0 fL   MCH 30.5 26.0 - 34.0 pg   MCHC 34.1 30.0 - 36.0 g/dL   RDW 59.5 63.8 - 75.6 %   Platelets 256 150 - 400 K/uL   nRBC 0.0 0.0 - 0.2 %  Comprehensive metabolic panel     Status: None   Collection Time: 07/20/20  5:34 PM  Result Value Ref Range   Sodium 136 135 - 145 mmol/L   Potassium 4.3 3.5 - 5.1 mmol/L   Chloride 108 98 - 111 mmol/L   CO2 22 22 - 32 mmol/L   Glucose, Bld 97 70 - 99 mg/dL   BUN 14 6 - 20 mg/dL   Creatinine, Ser 09/20/20 0.44 - 1.00 mg/dL   Calcium 9.1 8.9 - 2.95 mg/dL   Total Protein 6.7 6.5 - 8.1 g/dL   Albumin 3.9 3.5 - 5.0 g/dL   AST 18 15 - 41 U/L   ALT 13 0 - 44 U/L   Alkaline Phosphatase 42 38 - 126 U/L   Total Bilirubin 0.5 0.3 - 1.2  mg/dL   GFR, Estimated >24 >82 mL/min   Anion gap 6 5 - 15  hCG, quantitative, pregnancy     Status: Abnormal   Collection Time: 07/20/20  5:34 PM   Result Value Ref Range   hCG, Beta Chain, Quant, S 253 (H) <5 mIU/mL  Wet prep, genital     Status: Abnormal   Collection Time: 07/20/20  5:50 PM   Specimen: Vaginal  Result Value Ref Range   Yeast Wet Prep HPF POC NONE SEEN NONE SEEN   Trich, Wet Prep NONE SEEN NONE SEEN   Clue Cells Wet Prep HPF POC PRESENT (A) NONE SEEN   WBC, Wet Prep HPF POC FEW (A) NONE SEEN   Sperm NONE SEEN    US OB Transvaginal  Result Date: 07/20/2020 CLINICAL DATA:  Abdomen pain EXAM: TRANSVAGINAL OB ULTRASOUND TECHNIQUE: Transvaginal ultrasound was performed for complete evaluation of the gestation as well as the maternal uterus, adnexal regions, and pelvic cul-de-sac. COMPARISON:  07/14/2020 FINDINGS: Intrauterine gestational sac: None Yolk sac:  Not Visualized. Embryo:  Not Visualized. Maternal uterus/adnexae: Ovaries are within normal limits. Right ovary measures 3.7 x 2.1 x 3.1 cm. Left ovary measures 2.4 x 1.7 x 1.7 cm. Previously noted tubular mass at the right adnexa is not identified today. IMPRESSION: 1. No IUP identified. Previous possible early intrauterine gestational sac no longer visualized. 2. The previously described tubular mass at the right adnexa is also not identified. Electronically Signed   By: Jasmine Pang M.D.   On: 07/20/2020 18:51    MDM Patient visibly uncomfortable upon arrival. Benign abdominal exam. Labs, ultrasound, and pain medication ordered.   Pain improved with med. Ultrasound shows no IUP and adnexal mass previously seen is no longer present, no free fluid. Significant drop in HCG from 1325 down to 253. She is RH positive.  Discussed results with patient - change in hormone level consistent with miscarriage. She will f/u with [facility].   She had negative GC/CT & wet prep last week but would like to be retested. Wet prep positive for clue cells - will tx for BV, pt prefers metrogel.   Assessment and Plan   1. Spontaneous miscarriage   2. Abdominal pain during pregnancy in first  trimester   3. Bacterial vaginosis    -reviewed reasons to return to MAU -Rx ibuprofen prn -Rx metrogel -GC/CT pending -msg to [facility] for SAB f/u  Judeth Horn 07/20/2020, 5:24 PM

## 2020-07-23 DIAGNOSIS — Z03818 Encounter for observation for suspected exposure to other biological agents ruled out: Secondary | ICD-10-CM | POA: Diagnosis not present

## 2020-07-23 LAB — GC/CHLAMYDIA PROBE AMP (~~LOC~~) NOT AT ARMC
Chlamydia: NEGATIVE
Comment: NEGATIVE
Comment: NORMAL
Neisseria Gonorrhea: NEGATIVE

## 2020-07-25 ENCOUNTER — Ambulatory Visit: Payer: Medicaid Other

## 2020-07-30 DIAGNOSIS — Z03818 Encounter for observation for suspected exposure to other biological agents ruled out: Secondary | ICD-10-CM | POA: Diagnosis not present

## 2020-08-06 DIAGNOSIS — Z03818 Encounter for observation for suspected exposure to other biological agents ruled out: Secondary | ICD-10-CM | POA: Diagnosis not present

## 2020-08-13 DIAGNOSIS — Z03818 Encounter for observation for suspected exposure to other biological agents ruled out: Secondary | ICD-10-CM | POA: Diagnosis not present

## 2020-08-20 DIAGNOSIS — Z03818 Encounter for observation for suspected exposure to other biological agents ruled out: Secondary | ICD-10-CM | POA: Diagnosis not present

## 2020-08-27 DIAGNOSIS — Z03818 Encounter for observation for suspected exposure to other biological agents ruled out: Secondary | ICD-10-CM | POA: Diagnosis not present

## 2020-09-03 DIAGNOSIS — Z03818 Encounter for observation for suspected exposure to other biological agents ruled out: Secondary | ICD-10-CM | POA: Diagnosis not present

## 2020-09-10 DIAGNOSIS — Z03818 Encounter for observation for suspected exposure to other biological agents ruled out: Secondary | ICD-10-CM | POA: Diagnosis not present

## 2020-09-12 ENCOUNTER — Encounter: Payer: Self-pay | Admitting: Family Medicine

## 2020-09-12 ENCOUNTER — Other Ambulatory Visit: Payer: Self-pay | Admitting: Student

## 2020-09-13 MED ORDER — METRONIDAZOLE 0.75 % VA GEL: 1.0000 | Freq: Two times a day (BID) | VAGINAL | 0 refills | Status: DC

## 2020-09-18 DIAGNOSIS — Z03818 Encounter for observation for suspected exposure to other biological agents ruled out: Secondary | ICD-10-CM | POA: Diagnosis not present

## 2020-09-24 DIAGNOSIS — Z03818 Encounter for observation for suspected exposure to other biological agents ruled out: Secondary | ICD-10-CM | POA: Diagnosis not present

## 2020-10-01 DIAGNOSIS — Z03818 Encounter for observation for suspected exposure to other biological agents ruled out: Secondary | ICD-10-CM | POA: Diagnosis not present

## 2020-10-08 ENCOUNTER — Emergency Department (HOSPITAL_COMMUNITY)
Admission: EM | Admit: 2020-10-08 | Discharge: 2020-10-09 | Disposition: A | Discharge status: Home / Self Care | Payer: Medicaid Other | Attending: Emergency Medicine | Admitting: Emergency Medicine

## 2020-10-08 ENCOUNTER — Encounter (HOSPITAL_COMMUNITY): Payer: Self-pay

## 2020-10-08 ENCOUNTER — Other Ambulatory Visit: Payer: Self-pay

## 2020-10-08 DIAGNOSIS — R109 Unspecified abdominal pain: Secondary | ICD-10-CM | POA: Insufficient documentation

## 2020-10-08 DIAGNOSIS — Z5321 Procedure and treatment not carried out due to patient leaving prior to being seen by health care provider: Secondary | ICD-10-CM | POA: Diagnosis not present

## 2020-10-08 DIAGNOSIS — Z03818 Encounter for observation for suspected exposure to other biological agents ruled out: Secondary | ICD-10-CM | POA: Diagnosis not present

## 2020-10-08 LAB — COMPREHENSIVE METABOLIC PANEL
ALT: 14 U/L (ref 0–44)
AST: 17 U/L (ref 15–41)
Albumin: 4.6 g/dL (ref 3.5–5.0)
Alkaline Phosphatase: 49 U/L (ref 38–126)
Anion gap: 11 (ref 5–15)
BUN: 16 mg/dL (ref 6–20)
CO2: 24 mmol/L (ref 22–32)
Calcium: 9.3 mg/dL (ref 8.9–10.3)
Chloride: 103 mmol/L (ref 98–111)
Creatinine, Ser: 0.81 mg/dL (ref 0.44–1.00)
GFR, Estimated: >60 mL/min (ref >60)
Glucose, Bld: 105 mg/dL — ABNORMAL HIGH (ref 70–99)
Potassium: 4 mmol/L (ref 3.5–5.1)
Sodium: 138 mmol/L (ref 135–145)
Total Bilirubin: 0.6 mg/dL (ref 0.3–1.2)
Total Protein: 7.8 g/dL (ref 6.5–8.1)

## 2020-10-08 LAB — CBC WITH DIFFERENTIAL/PLATELET
Abs Immature Granulocytes: 0.03 10*3/uL (ref 0.00–0.07)
Basophils Absolute: 0.1 10*3/uL (ref 0.0–0.1)
Basophils Relative: 1 %
Eosinophils Absolute: 0.1 10*3/uL (ref 0.0–0.5)
Eosinophils Relative: 1 %
HCT: 39.7 % (ref 36.0–46.0)
Hemoglobin: 13.4 g/dL (ref 12.0–15.0)
Immature Granulocytes: 0 %
Lymphocytes Relative: 19 %
Lymphs Abs: 2 10*3/uL (ref 0.7–4.0)
MCH: 31.2 pg (ref 26.0–34.0)
MCHC: 33.8 g/dL (ref 30.0–36.0)
MCV: 92.3 fL (ref 80.0–100.0)
Monocytes Absolute: 0.8 10*3/uL (ref 0.1–1.0)
Monocytes Relative: 8 %
Neutro Abs: 7.9 10*3/uL — ABNORMAL HIGH (ref 1.7–7.7)
Neutrophils Relative %: 71 %
Platelets: 258 10*3/uL (ref 150–400)
RBC: 4.3 MIL/uL (ref 3.87–5.11)
RDW: 12.5 % (ref 11.5–15.5)
WBC: 10.9 10*3/uL — ABNORMAL HIGH (ref 4.0–10.5)
nRBC: 0 % (ref 0.0–0.2)

## 2020-10-08 LAB — URINALYSIS, ROUTINE W REFLEX MICROSCOPIC
Bilirubin Urine: NEGATIVE
Glucose, UA: NEGATIVE mg/dL
Ketones, ur: NEGATIVE mg/dL
Nitrite: NEGATIVE
Protein, ur: 30 mg/dL — AB
Specific Gravity, Urine: 1.02 (ref 1.005–1.030)
pH: 6 (ref 5.0–8.0)

## 2020-10-08 LAB — URINALYSIS, MICROSCOPIC (REFLEX): WBC, UA: >50 WBC/hpf (ref 0–5)

## 2020-10-08 NOTE — ED Triage Notes (Signed)
Pt complains of flank pain and abdominal pain x 2 days.

## 2020-10-09 ENCOUNTER — Other Ambulatory Visit: Payer: Self-pay

## 2020-10-09 ENCOUNTER — Other Ambulatory Visit: Payer: Self-pay | Admitting: Family Medicine

## 2020-10-09 ENCOUNTER — Encounter (HOSPITAL_COMMUNITY): Payer: Self-pay | Admitting: Emergency Medicine

## 2020-10-09 ENCOUNTER — Telehealth: Payer: Self-pay

## 2020-10-09 ENCOUNTER — Ambulatory Visit (HOSPITAL_COMMUNITY)
Admission: EM | Admit: 2020-10-09 | Discharge: 2020-10-09 | Disposition: A | Discharge status: Home / Self Care | Payer: Medicaid Other | Attending: Student | Admitting: Student

## 2020-10-09 DIAGNOSIS — N760 Acute vaginitis: Secondary | ICD-10-CM | POA: Insufficient documentation

## 2020-10-09 DIAGNOSIS — Z3202 Encounter for pregnancy test, result negative: Secondary | ICD-10-CM | POA: Diagnosis not present

## 2020-10-09 LAB — POC URINE PREG, ED: Preg Test, Ur: NEGATIVE

## 2020-10-09 LAB — POCT URINALYSIS DIPSTICK, ED / UC
Bilirubin Urine: NEGATIVE
Glucose, UA: NEGATIVE mg/dL
Nitrite: POSITIVE — AB
Protein, ur: 100 mg/dL — AB
Specific Gravity, Urine: 1.025 (ref 1.005–1.030)
Urobilinogen, UA: 1 mg/dL (ref 0.0–1.0)
pH: 5.5 (ref 5.0–8.0)

## 2020-10-09 MED ORDER — METRONIDAZOLE 500 MG PO TABS: 500.0000 mg | ORAL_TABLET | Freq: Two times a day (BID) | ORAL | 0 refills | Status: DC

## 2020-10-09 MED ORDER — METRONIDAZOLE 0.75 % VA GEL: 1.0000 | Freq: Every day | VAGINAL | 0 refills | Status: DC

## 2020-10-09 NOTE — Discharge Instructions (Addendum)
-  Your pregnancy test is negative -For bacterial vaginosis, start the antibiotic-Flagyl (metronidazole), 2 pills daily for 7 days.  You can take this with food if you have a sensitive stomach.  Avoid alcohol while taking this medication and for 2 days after as this will cause severe nausea and vomiting. -We'll call if urine culture is positive -Seek additional immediate medical attention if symptoms getting worse instead of better

## 2020-10-09 NOTE — Telephone Encounter (Signed)
Patient calls nurse line to follow up with provider regarding results from ED visit last night. Patient LWBS prior to receiving results and would like to speak with provider regarding these results.   Please advise.   Veronda Prude, RN

## 2020-10-09 NOTE — ED Provider Notes (Signed)
[facility]    CSN: [id number] Arrival date & time: 10/09/20  1220      History   Chief Complaint Chief Complaint  Patient presents with   Abdominal Pain    HPI Rachel Lloyd is a 35 y.o. female presenting with abdominal pain for about 3 days.  This patient actually went to the emergency department last night but was not seen as the wait time was too long.  History of trichomoniasis, 12 pregnancies per patient, bacterial vaginosis, chlamydia, irregular menstrual bleeding.  States that she has had crampy right lower abdominal pain and dysuria for about 3 days, scant clear vaginal discharge.  Denies new partners.  States this feels like bacterial vaginosis, but she does have history of ectopic pregnancy and so she wants to also rule out pregnancy.  Denies nausea, vomiting, change in bowel bladder function including diarrhea or constipation.  Denies fever/chills, hematuria, flank pain.  HPI  Past Medical History:  Diagnosis Date   Chlamydia    Hypertension    During second pregnancy   Mental disorder 2010   bipolar   Pneumonia    Trichimoniasis     Patient Active Problem List   Diagnosis Date Noted   BV (bacterial vaginosis) 09/13/2019   General counselling and advice on contraception 09/13/2019   No-show for appointment 03/17/2019   Post-procedural fever 03/09/2019   Abscess 03/08/2019   Trichomonas infection 02/18/2019   Contact dermatitis 02/18/2019   Positive pregnancy test 10/06/2018   Migraine with aura 09/01/2018   Irregular bleeding 02/03/2018   MDD (major depressive disorder), recurrent severe, 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 Authorizing Provider  metroNIDAZOLE (METROGEL VAGINAL) 0.75 % vaginal gel Place 1 Applicatorful vaginally at bedtime for 5 days. 10/09/20 10/14/20 Yes Rhys Martini, PA-C  acetaminophen (TYLENOL) 500 MG tablet Take 1 tablet (500 mg total) by mouth every 6 (six) hours as needed. 06/03/19   Avegno, Zachery Dakins, FNP  ibuprofen (ADVIL) 600 MG tablet Take 1 tablet (600 mg total) by mouth every 6 (six) hours as needed. 07/20/20   Judeth Horn, NP    Family History Family History  Problem Relation Age of Onset   Hypertension Mother    Cancer Mother    Fibroids Mother    Healthy Father    ADD / ADHD Brother    Hypertension Maternal Grandmother    Anesthesia problems Neg Hx     Social History Social History   Tobacco Use   Smoking status: Former    Types: Cigarettes    Quit date: 04/04/2010    Years since quitting: 10.5   Smokeless tobacco: Never  Vaping Use   Vaping Use: Never used  Substance Use Topics   Alcohol use: Not Currently   Drug use: Not Currently    Types: Marijuana, Cocaine     Allergies   Penicillins and Latex   Review of Systems Review of Systems  Constitutional:  Negative for chills and fever.  HENT:  Negative for sore throat.   Eyes:  Negative for pain and redness.  Respiratory:  Negative for shortness of breath.   Cardiovascular:  Negative for chest pain.  Gastrointestinal:  Positive for abdominal pain. Negative for diarrhea, nausea and vomiting.  Genitourinary:  Positive for vaginal discharge. Negative for decreased urine volume, difficulty urinating, dysuria, flank pain, frequency, genital sores, hematuria, menstrual problem, pelvic pain, urgency, vaginal bleeding and vaginal pain.  Musculoskeletal:  Negative for back pain.  Skin:  Negative for rash.    Physical Exam Triage Vital  Signs ED Triage Vitals  Enc Vitals Group     BP 10/09/20 1433 113/81     Pulse Rate 10/09/20 1433 80     Resp 10/09/20 1433 17     Temp 10/09/20 1437 98.7 F (37.1 C)     Temp src --      SpO2 10/09/20 1433 98 %     Weight --      Height --      Head Circumference --      Peak Flow --      Pain Score 10/09/20 1436 10     Pain Loc --      Pain Edu? --      Excl. in GC? --    No data found.  Updated Vital Signs BP 113/81   Pulse 80   Temp 98.7 F (37.1 C)   Resp 17   LMP 09/24/2020 (Approximate)   SpO2 98%   Breastfeeding No   Visual Acuity Right Eye Distance:   Left Eye Distance:   Bilateral Distance:    Right Eye Near:   Left Eye Near:    Bilateral Near:     Physical Exam Vitals reviewed.  Constitutional:      General: She is not in acute distress.    Appearance: Normal appearance. She is not ill-appearing.  HENT:     Head: Normocephalic and atraumatic.     Mouth/Throat:     Mouth: Mucous membranes are moist.     Comments: Moist mucous membranes Eyes:     Extraocular Movements: Extraocular movements intact.     Pupils: Pupils are equal, round, and reactive to light.  Cardiovascular:     Rate and Rhythm: Normal rate and regular rhythm.     Heart sounds: Normal heart sounds.  Pulmonary:     Effort: Pulmonary effort is normal.     Breath sounds: Normal breath sounds. No wheezing, rhonchi or rales.  Abdominal:     General: Bowel sounds are normal. There is no distension.     Palpations: Abdomen is soft. There is no mass.     Tenderness: There is abdominal tenderness in the right lower quadrant. There is no right CVA tenderness, left CVA tenderness, guarding or rebound. Negative signs include Murphy's sign, Rovsing's sign and McBurney's sign.     Comments: RLQ tenderness without guarding rebound or mass. Patient comfortable throughout exam .  Genitourinary:    Comments: DEFERRED Skin:    General: Skin is warm.     Capillary Refill: Capillary refill takes  less than 2 seconds.     Comments: Good skin turgor  Neurological:     General: No  focal deficit present.     Mental Status: She is alert and oriented to person, place, and time.  Psychiatric:        Mood and Affect: Mood normal.        Behavior: Behavior normal.     UC Treatments / Results  Labs (all labs ordered are listed, but only abnormal results are displayed) Labs Reviewed  POCT URINALYSIS DIPSTICK, ED / UC - Abnormal; Notable for the following components:      Result Value   Ketones, ur TRACE (*)    Hgb urine dipstick LARGE (*)    Protein, ur 100 (*)    Nitrite POSITIVE (*)    Leukocytes,Ua MODERATE (*)    All other components within normal limits  URINE CULTURE  POC URINE PREG, ED  POC URINE PREG, ED  CERVICOVAGINAL ANCILLARY ONLY    EKG   Radiology No results found.  Procedures Procedures (including critical care time)  Medications Ordered in UC Medications - No data to display  Initial Impression / Assessment and Plan / UC Course  I have reviewed the triage vital signs and the nursing notes.  Pertinent labs & imaging results that were available during my care of the patient were reviewed by me and considered in my medical decision making (see chart for details).     This patient is a very pleasant 35 y.o. year old female presenting with suspected vaginitis. Afebrile, nontachycardic, RLQ pain but no reproducible CVAT. Long history BV. Denies STI risk.   LMP 09/24/20. U-preg negative.  UA with large blood, positive nitrite, moderate leuk. Suspect this is related to vaginitis but did send culture.  Denies STI risk. Will send self-swab for G/C, trich, yeast, BV testing. Declines HIV, RPR. Safe sex precautions.   Will treat for BV with metrogel as below, patient does not tolerate flagyl PO.   ED return precautions discussed. Patient verbalizes understanding and agreement.   Coding Level 4 for review of past notes/labs, order and interpretation of labs  today, and prescription drug management      Final Clinical Impressions(s) / UC Diagnoses   Final diagnoses:  Negative pregnancy test  Vaginitis and vulvovaginitis     Discharge Instructions      -Your pregnancy test is negative -For bacterial vaginosis, start the antibiotic-Flagyl (metronidazole), 2 pills daily for 7 days.  You can take this with food if you have a sensitive stomach.  Avoid alcohol while taking this medication and for 2 days after as this will cause severe nausea and vomiting. -We'll call if urine culture is positive -Seek additional immediate medical attention if symptoms getting worse instead of better   ED Prescriptions     Medication Sig Dispense Auth. Provider   metroNIDAZOLE (FLAGYL) 500 MG tablet  (Status: Discontinued) Take 1 tablet (500 mg total) by mouth 2 (two) times daily. Avoid alcohol while taking this medication and for 2 days after 14 tablet Rhys Martini, PA-C   metroNIDAZOLE (METROGEL VAGINAL) 0.75 % vaginal gel Place 1 Applicatorful vaginally at bedtime for 5 days. 70 g Rhys Martini, PA-C      PDMP not reviewed this encounter.   Rhys Martini, PA-C 10/09/20 1645

## 2020-10-09 NOTE — ED Triage Notes (Signed)
Pt is present today with right side pain and dysuria. Pt states that the pain started x3 days ago

## 2020-10-10 LAB — CERVICOVAGINAL ANCILLARY ONLY
Bacterial Vaginitis (gardnerella): POSITIVE — AB
Candida Glabrata: NEGATIVE
Candida Vaginitis: NEGATIVE
Chlamydia: POSITIVE — AB
Comment: NEGATIVE
Comment: NEGATIVE
Comment: NEGATIVE
Comment: NEGATIVE
Comment: NEGATIVE
Comment: NORMAL
Neisseria Gonorrhea: NEGATIVE
Trichomonas: NEGATIVE

## 2020-10-10 MED ORDER — METRONIDAZOLE 0.75 % VA GEL: 1.0000 | Freq: Two times a day (BID) | VAGINAL | 0 refills | Status: DC

## 2020-10-11 ENCOUNTER — Telehealth (HOSPITAL_COMMUNITY): Payer: Self-pay | Admitting: Emergency Medicine

## 2020-10-11 LAB — URINE CULTURE: Culture: >=100000 — AB

## 2020-10-11 MED ORDER — DOXYCYCLINE HYCLATE 100 MG PO CAPS: 100.0000 mg | ORAL_CAPSULE | Freq: Two times a day (BID) | ORAL | 0 refills | Status: AC

## 2020-10-11 MED ORDER — NITROFURANTOIN MONOHYD MACRO 100 MG PO CAPS: 100.0000 mg | ORAL_CAPSULE | Freq: Two times a day (BID) | ORAL | 0 refills | Status: DC

## 2020-10-11 NOTE — Telephone Encounter (Signed)
Called patient about results. She states she received her results and medication and she is doing fine.   Rochester Serpe Autry-Lott, DO 10/11/2020, 9:52 PM PGY-3, [facility]

## 2020-10-15 DIAGNOSIS — Z03818 Encounter for observation for suspected exposure to other biological agents ruled out: Secondary | ICD-10-CM | POA: Diagnosis not present

## 2020-10-22 DIAGNOSIS — Z03818 Encounter for observation for suspected exposure to other biological agents ruled out: Secondary | ICD-10-CM | POA: Diagnosis not present

## 2020-10-29 DIAGNOSIS — Z03818 Encounter for observation for suspected exposure to other biological agents ruled out: Secondary | ICD-10-CM | POA: Diagnosis not present

## 2020-10-29 DIAGNOSIS — F17200 Nicotine dependence, unspecified, uncomplicated: Secondary | ICD-10-CM | POA: Diagnosis not present

## 2020-10-31 DIAGNOSIS — F17200 Nicotine dependence, unspecified, uncomplicated: Secondary | ICD-10-CM | POA: Diagnosis not present

## 2020-11-02 DIAGNOSIS — F17200 Nicotine dependence, unspecified, uncomplicated: Secondary | ICD-10-CM | POA: Diagnosis not present

## 2020-11-05 DIAGNOSIS — Z03818 Encounter for observation for suspected exposure to other biological agents ruled out: Secondary | ICD-10-CM | POA: Diagnosis not present

## 2020-11-05 DIAGNOSIS — F17200 Nicotine dependence, unspecified, uncomplicated: Secondary | ICD-10-CM | POA: Diagnosis not present

## 2020-11-07 DIAGNOSIS — F17200 Nicotine dependence, unspecified, uncomplicated: Secondary | ICD-10-CM | POA: Diagnosis not present

## 2020-11-09 DIAGNOSIS — F17200 Nicotine dependence, unspecified, uncomplicated: Secondary | ICD-10-CM | POA: Diagnosis not present

## 2020-11-12 DIAGNOSIS — F17200 Nicotine dependence, unspecified, uncomplicated: Secondary | ICD-10-CM | POA: Diagnosis not present

## 2020-11-12 DIAGNOSIS — Z03818 Encounter for observation for suspected exposure to other biological agents ruled out: Secondary | ICD-10-CM | POA: Diagnosis not present

## 2020-11-14 DIAGNOSIS — F17200 Nicotine dependence, unspecified, uncomplicated: Secondary | ICD-10-CM | POA: Diagnosis not present

## 2020-11-16 DIAGNOSIS — F17200 Nicotine dependence, unspecified, uncomplicated: Secondary | ICD-10-CM | POA: Diagnosis not present

## 2020-11-18 ENCOUNTER — Other Ambulatory Visit: Payer: Self-pay | Admitting: Family Medicine

## 2020-11-19 DIAGNOSIS — F17200 Nicotine dependence, unspecified, uncomplicated: Secondary | ICD-10-CM | POA: Diagnosis not present

## 2020-11-19 DIAGNOSIS — Z03818 Encounter for observation for suspected exposure to other biological agents ruled out: Secondary | ICD-10-CM | POA: Diagnosis not present

## 2020-11-20 MED ORDER — METRONIDAZOLE 0.75 % VA GEL: 1.0000 | Freq: Two times a day (BID) | VAGINAL | 0 refills | Status: DC

## 2020-11-21 DIAGNOSIS — F17200 Nicotine dependence, unspecified, uncomplicated: Secondary | ICD-10-CM | POA: Diagnosis not present

## 2020-11-23 DIAGNOSIS — F17200 Nicotine dependence, unspecified, uncomplicated: Secondary | ICD-10-CM | POA: Diagnosis not present

## 2020-11-26 DIAGNOSIS — Z03818 Encounter for observation for suspected exposure to other biological agents ruled out: Secondary | ICD-10-CM | POA: Diagnosis not present

## 2020-11-26 DIAGNOSIS — F17200 Nicotine dependence, unspecified, uncomplicated: Secondary | ICD-10-CM | POA: Diagnosis not present

## 2020-11-28 DIAGNOSIS — F17200 Nicotine dependence, unspecified, uncomplicated: Secondary | ICD-10-CM | POA: Diagnosis not present

## 2020-11-30 DIAGNOSIS — F17200 Nicotine dependence, unspecified, uncomplicated: Secondary | ICD-10-CM | POA: Diagnosis not present

## 2020-12-03 DIAGNOSIS — Z03818 Encounter for observation for suspected exposure to other biological agents ruled out: Secondary | ICD-10-CM | POA: Diagnosis not present

## 2020-12-10 DIAGNOSIS — Z03818 Encounter for observation for suspected exposure to other biological agents ruled out: Secondary | ICD-10-CM | POA: Diagnosis not present

## 2020-12-24 ENCOUNTER — Other Ambulatory Visit: Payer: Self-pay

## 2020-12-24 ENCOUNTER — Ambulatory Visit (INDEPENDENT_AMBULATORY_CARE_PROVIDER_SITE_OTHER): Payer: Medicaid Other

## 2020-12-24 DIAGNOSIS — Z111 Encounter for screening for respiratory tuberculosis: Secondary | ICD-10-CM

## 2020-12-24 NOTE — Progress Notes (Signed)
Patient is here for a PPD placement.  PPD placed in right forearm @ 1010 am.  Patient will return 12/26/2020 to have PPD read. Veronda Prude, RN

## 2020-12-26 ENCOUNTER — Other Ambulatory Visit: Payer: Self-pay

## 2020-12-26 ENCOUNTER — Telehealth: Payer: Self-pay

## 2020-12-26 ENCOUNTER — Ambulatory Visit: Payer: Medicaid Other

## 2020-12-26 ENCOUNTER — Encounter: Payer: Self-pay | Admitting: Family Medicine

## 2020-12-26 ENCOUNTER — Ambulatory Visit (INDEPENDENT_AMBULATORY_CARE_PROVIDER_SITE_OTHER): Payer: Medicaid Other | Admitting: Family Medicine

## 2020-12-26 VITALS — BP 100/70 | HR 90 | Ht 62.0 in | Wt 141.4 lb

## 2020-12-26 DIAGNOSIS — Z3200 Encounter for pregnancy test, result unknown: Secondary | ICD-10-CM | POA: Diagnosis not present

## 2020-12-26 DIAGNOSIS — Z3201 Encounter for pregnancy test, result positive: Secondary | ICD-10-CM | POA: Diagnosis not present

## 2020-12-26 DIAGNOSIS — Z111 Encounter for screening for respiratory tuberculosis: Secondary | ICD-10-CM

## 2020-12-26 LAB — POCT URINE PREGNANCY: Preg Test, Ur: NEGATIVE

## 2020-12-26 LAB — TB SKIN TEST
Induration: 0 mm
TB Skin Test: NEGATIVE

## 2020-12-26 NOTE — Telephone Encounter (Signed)
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 Patient reports she was given a note a few months ago to return to work, however she needs this revised to return without restrictions.   The original note was written back in July. 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   Given the time that has past and uncertainty of pregnancy status at this time will defer to a provider. Patient scheduled for this afternoon.

## 2020-12-26 NOTE — Progress Notes (Signed)
° ° °  SUBJECTIVE:   CHIEF COMPLAINT / HPI: need for return to work note   Patient presents for follow up with request for a note to return to work She states that she was restricted but able to return to work safely, restrictions were due to high risk pregnancies and multiple pregnancy losses  Patient states that she would like to return to work ASAP without restrictions  She reports having her LMP in Nov but states that it ended earlier than normal  She has been having some rare occurrences of lower abdominal discomfort, denies vaginal bleeding  Patient has been sexually active in last two weeks  She states that she has not had nausea associated with the abdominal pain, reports normal BM  PERTINENT  PMH / PSH:  Hx of multiple pregnancy losses    OBJECTIVE:   BP 100/70    Pulse 90    Ht 5\' 2"  (1.575 m)    Wt 141 lb 6.4 oz (64.1 kg)    LMP 05/07/2020 (Approximate)    BMI 25.86 kg/m   General: female appearing stated age in no acute distress Cardio: Normal S1 and S2, no S3 or S4. Rhythm is regular. No murmurs or rubs.  Bilateral radial pulses palpable Pulm: Clear to auscultation bilaterally, no crackles, wheezing, or diminished breath sounds. Normal respiratory effort, stable on RA Abdomen: Bowel sounds normal. Abdomen soft, tenderness in left and right lower quadrants and suprapubic region  Extremities: No peripheral edema. Warm/ well perfused.   ASSESSMENT/PLAN:   Possible pregnancy UPT collected and negative today  Patient given note to return to work without restrictions      05/09/2020, MD [facility]

## 2020-12-26 NOTE — Patient Instructions (Addendum)
Today you were given a letter to return to work. Your pregnancy test was negative.

## 2020-12-26 NOTE — Progress Notes (Signed)
PPD Reading Note  PPD read and results entered in EpicCare.  Result: 0 mm induration.  Interpretation: Negative  Allergic reaction: no

## 2020-12-28 DIAGNOSIS — Z3200 Encounter for pregnancy test, result unknown: Secondary | ICD-10-CM | POA: Insufficient documentation

## 2020-12-28 NOTE — Assessment & Plan Note (Signed)
UPT collected and negative today  Patient given note to return to work without restrictions

## 2021-01-10 ENCOUNTER — Other Ambulatory Visit: Payer: Self-pay

## 2021-01-10 ENCOUNTER — Encounter (HOSPITAL_COMMUNITY): Payer: Self-pay

## 2021-01-10 ENCOUNTER — Ambulatory Visit (HOSPITAL_COMMUNITY)
Admission: EM | Admit: 2021-01-10 | Discharge: 2021-01-10 | Disposition: A | Discharge status: Home / Self Care | Payer: Medicaid Other

## 2021-01-10 DIAGNOSIS — R6884 Jaw pain: Secondary | ICD-10-CM
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
## 2021-01-10 NOTE — Discharge Instructions (Addendum)
-  I'm concerned about the trauma to your head, especially jaw. I think you need a scan to make sure you don't have a broken jaw or other trauma. Please head straight there. They'll also likely do antibiotics, tetanus shot, STI screen.

## 2021-01-11 ENCOUNTER — Encounter (HOSPITAL_BASED_OUTPATIENT_CLINIC_OR_DEPARTMENT_OTHER): Payer: Self-pay | Admitting: Emergency Medicine

## 2021-01-11 ENCOUNTER — Other Ambulatory Visit: Payer: Self-pay

## 2021-01-11 ENCOUNTER — Emergency Department (HOSPITAL_BASED_OUTPATIENT_CLINIC_OR_DEPARTMENT_OTHER)
Admission: EM | Admit: 2021-01-11 | Discharge: 2021-01-11 | Disposition: A | Discharge status: Home / Self Care | Payer: Medicaid Other | Attending: Emergency Medicine | Admitting: Emergency Medicine

## 2021-01-11 DIAGNOSIS — S01451A Open bite of right cheek and temporomandibular area, initial encounter: Secondary | ICD-10-CM | POA: Insufficient documentation

## 2021-01-11 DIAGNOSIS — I10 Essential (primary) hypertension: Secondary | ICD-10-CM | POA: Diagnosis not present

## 2021-01-11 DIAGNOSIS — N898 Other specified noninflammatory disorders of vagina: Secondary | ICD-10-CM | POA: Diagnosis not present

## 2021-01-11 DIAGNOSIS — Z87891 Personal history of nicotine dependence: Secondary | ICD-10-CM | POA: Diagnosis not present

## 2021-01-11 DIAGNOSIS — M791 Myalgia, unspecified site: Secondary | ICD-10-CM

## 2021-01-11 DIAGNOSIS — Z91040 Latex allergy status: Secondary | ICD-10-CM | POA: Diagnosis not present

## 2021-01-11 DIAGNOSIS — S0993XA Unspecified injury of face, initial encounter: Secondary | ICD-10-CM | POA: Diagnosis present

## 2021-01-11 DIAGNOSIS — W503XXS Accidental bite by another person, sequela: Secondary | ICD-10-CM

## 2021-01-11 LAB — WET PREP, GENITAL
Sperm: NONE SEEN
Trich, Wet Prep: NONE SEEN
WBC, Wet Prep HPF POC: <10 (ref <10)
Yeast Wet Prep HPF POC: NONE SEEN

## 2021-01-11 MED ORDER — DOXYCYCLINE HYCLATE 100 MG PO CAPS: 100.0000 mg | ORAL_CAPSULE | Freq: Two times a day (BID) | ORAL | 0 refills | Status: AC
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
## 2021-01-11 NOTE — ED Notes (Signed)
EDP provided AVS using Teachback Method. Patient verbalizes understanding of Discharge Instructions. Opportunity for Questioning and Answers were provided by EDP. Patient Discharged from ED.  ° °
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
## 2021-01-14 ENCOUNTER — Telehealth: Payer: Self-pay

## 2021-01-14 NOTE — Telephone Encounter (Signed)
Transition Care Management Follow-up Telephone Call Date of discharge and from where: 01/11/2021 from [facility] How have you been since you were released from the hospital? Pt stated that she is feeling okay and did not have any questions or concerns at this time. Pt is picking up abx today.  Any questions or concerns? No  Items Reviewed: Did the pt receive and understand the discharge instructions provided? Yes  Medications obtained and verified? Yes  Other? No  Any new allergies since your discharge? No  Dietary orders reviewed? No Do you have support at home? Yes   Functional Questionnaire: (I = Independent and D = Dependent) ADLs: I  Bathing/Dressing- I  Meal Prep- I  Eating- I  Maintaining continence- I  Transferring/Ambulation- I  Managing Meds- I   Follow up appointments reviewed:  PCP Hospital f/u appt confirmed? No  Pt stated she will reach out to pcp if symptoms worsen or fail to improve.  Specialist Hospital f/u appt confirmed? No  Are transportation arrangements needed? No  If their condition worsens, is the pt aware to call PCP or go to the Emergency Dept.? Yes Was the patient provided with contact information for the PCP's office or ED? Yes Was to pt encouraged to call back with questions or concerns? Yes

## 2021-01-15 LAB — GC/CHLAMYDIA PROBE AMP (~~LOC~~) NOT AT ARMC
Chlamydia: NEGATIVE
Comment: NEGATIVE
Comment: NORMAL
Neisseria Gonorrhea: NEGATIVE

## 2021-02-18 ENCOUNTER — Ambulatory Visit (HOSPITAL_COMMUNITY)
Admission: RE | Admit: 2021-02-18 | Discharge: 2021-02-18 | Disposition: A | Discharge status: Home / Self Care | Payer: Medicaid Other | Attending: Psychiatry | Admitting: Psychiatry
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
## 2021-03-18 DIAGNOSIS — F17200 Nicotine dependence, unspecified, uncomplicated: Secondary | ICD-10-CM | POA: Diagnosis not present

## 2021-03-20 DIAGNOSIS — F17200 Nicotine dependence, unspecified, uncomplicated: Secondary | ICD-10-CM | POA: Diagnosis not present

## 2021-03-22 ENCOUNTER — Other Ambulatory Visit: Payer: Self-pay | Admitting: Internal Medicine

## 2021-03-22 ENCOUNTER — Ambulatory Visit
Admission: RE | Admit: 2021-03-22 | Discharge: 2021-03-22 | Disposition: A | Discharge status: Home / Self Care | Payer: Medicaid Other | Source: Ambulatory Visit | Attending: Internal Medicine | Admitting: Internal Medicine

## 2021-03-22 VITALS — BP 109/71 | HR 97 | Temp 98.9°F | Resp 18

## 2021-03-22 DIAGNOSIS — Z3201 Encounter for pregnancy test, result positive: Secondary | ICD-10-CM

## 2021-03-22 DIAGNOSIS — Z113 Encounter for screening for infections with a predominantly sexual mode of transmission: Secondary | ICD-10-CM

## 2021-03-22 DIAGNOSIS — F17200 Nicotine dependence, unspecified, uncomplicated: Secondary | ICD-10-CM | POA: Diagnosis not present

## 2021-03-22 LAB — POCT URINALYSIS DIP (MANUAL ENTRY)
Bilirubin, UA: NEGATIVE
Glucose, UA: NEGATIVE mg/dL
Ketones, POC UA: NEGATIVE mg/dL
Leukocytes, UA: NEGATIVE
Nitrite, UA: NEGATIVE
Protein Ur, POC: NEGATIVE mg/dL
Spec Grav, UA: >=1.03 — AB (ref 1.010–1.025)
Urobilinogen, UA: 0.2 E.U./dL
pH, UA: 5.5 (ref 5.0–8.0)

## 2021-03-22 LAB — POCT URINE PREGNANCY: Preg Test, Ur: POSITIVE — AB

## 2021-03-22 NOTE — ED Provider Notes (Signed)
?[facility] ? ? ? ?CSN: [id number] ?Arrival date & time: 03/22/21  1346 ? ? ?  ? ?History   ?Chief Complaint ?Chief Complaint  ?Patient presents with  ? vaginal sxs  ? ? ?HPI ?Rachel Lloyd is a 36 y.o. female.  ? ?Patient presents for further evaluation due to missed menstrual cycle and vaginal discharge.  Patient reports that her menstrual cycle was supposed to be 3 to 4 weeks ago but has not occurred.  Developed a fishy vaginal odor as well as a clear vaginal discharge a few days prior.  Also having some mild intermittent lower abdominal cramping over the past few days.  Denies urinary burning, urinary frequency, hematuria, pelvic pain, fever, back pain.  Denies any known exposure to STD. ? ? ? ?History reviewed. No pertinent past medical history. ? ?There are no problems to display for this patient. ? ? ?History reviewed. No pertinent surgical history. ? ?OB History   ?No obstetric history on file. ?  ? ? ? ?Home Medications   ? ?Prior to Admission medications   ?Not on File  ? ? ?Family History ?History reviewed. No pertinent family history. ? ?Social History ?Social History  ? ?Tobacco Use  ? Smoking status: Never  ? Smokeless tobacco: Never  ? ? ? ?Allergies   ?Penicillin g and Latex ? ? ?Review of Systems ?Review of Systems ?Per HPI ? ?Physical Exam ?Triage Vital Signs ?ED Triage Vitals  ?Enc Vitals Group  ?   BP 03/22/21 1415 109/71  ?   Pulse Rate 03/22/21 1415 97  ?   Resp 03/22/21 1415 18  ?   Temp 03/22/21 1415 98.9 ?F (37.2 ?C)  ?   Temp Source 03/22/21 1415 Oral  ?   SpO2 03/22/21 1415 98 %  ?   Weight --   ?   Height --   ?   Head Circumference --   ?   Peak Flow --   ?   Pain Score 03/22/21 1416 0  ?   Pain Loc --   ?   Pain Edu? --   ?   Excl. in GC? --   ? ?No data found. ? ?Updated Vital Signs ?BP 109/71 (BP Location: Right Arm)   Pulse 97   Temp 98.9 ?F (37.2 ?C) (Oral)   Resp 18   SpO2 98%  ? ?Visual Acuity ?Right Eye Distance:   ?Left Eye Distance:   ?Bilateral Distance:    ? ?Right Eye Near:   ?Left Eye Near:    ?Bilateral Near:    ? ?Physical Exam ?Constitutional:   ?   General: She is not in acute distress. ?   Appearance: Normal appearance. She is not toxic-appearing or diaphoretic.  ?HENT:  ?   Head: Normocephalic and atraumatic.  ?Eyes:  ?   Extraocular Movements: Extraocular movements intact.  ?   Conjunctiva/sclera: Conjunctivae normal.  ?Cardiovascular:  ?   Rate and Rhythm: Normal rate and regular rhythm.  ?   Pulses: Normal pulses.  ?   Heart sounds: Normal heart sounds.  ?Pulmonary:  ?   Effort: Pulmonary effort is normal. No respiratory distress.  ?   Breath sounds: Normal breath sounds.  ?Genitourinary: ?   Comments: Deferred with shared decision making.  Self swab performed. ?Neurological:  ?   General: No focal deficit present.  ?   Mental Status: She is alert and oriented to person, place, and time. Mental status is at baseline.  ?Psychiatric:     ?  Mood and Affect: Mood normal.     ?   Behavior: Behavior normal.     ?   Thought Content: Thought content normal.     ?   Judgment: Judgment normal.  ? ? ? ?UC Treatments / Results  ?Labs ?(all labs ordered are listed, but only abnormal results are displayed) ?Labs Reviewed  ?POCT URINALYSIS DIP (MANUAL ENTRY) - Abnormal; Notable for the following components:  ?    Result Value  ? Spec Grav, UA >=1.030 (*)   ? Blood, UA small (*)   ? All other components within normal limits  ?POCT URINE PREGNANCY - Abnormal; Notable for the following components:  ? Preg Test, Ur Positive (*)   ? All other components within normal limits  ?CERVICOVAGINAL ANCILLARY ONLY  ? ? ?EKG ? ? ?Radiology ?No results found. ? ?Procedures ?Procedures (including critical care time) ? ?Medications Ordered in UC ?Medications - No data to display ? ?Initial Impression / Assessment and Plan / UC Course  ?I have reviewed the triage vital signs and the nursing notes. ? ?Pertinent labs & imaging results that were available during my care of the patient were  reviewed by me and considered in my medical decision making (see chart for details). ? ?  ? ?Urine pregnancy test was positive.  Suspect vaginal discharge and lower abdominal cramping is related to early pregnancy symptoms.  Do not think that patient is in need of immediate medical attention at the hospital given that pain is not severe and symptoms are mild.  Discussed strict return and ER precautions.  Patient advised to start prenatal vitamin and follow-up with OB/GYN for further evaluation and management.  Cervicovaginal swab pending due to patient request.  Discussed return precautions.  Patient verbalized understanding and was agreeable with plan. ?Final Clinical Impressions(s) / UC Diagnoses  ? ?Final diagnoses:  ?Pregnancy test positive  ?Screening examination for venereal disease  ? ? ? ?Discharge Instructions   ? ?  ?Your pregnancy test was positive.  Please start prenatal vitamins and follow-up with OB/GYN as soon as possible.  Your STD test is pending.  We will call if it is positive. ? ? ? ?ED Prescriptions   ?None ?  ? ?PDMP not reviewed this encounter. ?  ?Gustavus Bryant, [place] ?03/22/21 1444 ? ?

## 2021-03-22 NOTE — ED Triage Notes (Signed)
Pt c/o missing a mensis first week of march, vaginal discharge, frequency, pelvic pressure, vaginal odor ? ?Denies dysuria, hematuria, back pain ? ?Onset ~ 3-4 days ago  ?

## 2021-03-22 NOTE — Discharge Instructions (Signed)
Your pregnancy test was positive.  Please start prenatal vitamins and follow-up with OB/GYN as soon as possible.  Your STD test is pending.  We will call if it is positive. ?

## 2021-03-25 ENCOUNTER — Encounter (HOSPITAL_BASED_OUTPATIENT_CLINIC_OR_DEPARTMENT_OTHER): Payer: Self-pay | Admitting: Emergency Medicine

## 2021-03-25 ENCOUNTER — Other Ambulatory Visit: Payer: Self-pay | Admitting: *Deleted

## 2021-03-25 DIAGNOSIS — F17200 Nicotine dependence, unspecified, uncomplicated: Secondary | ICD-10-CM | POA: Diagnosis not present

## 2021-03-25 DIAGNOSIS — Z3481 Encounter for supervision of other normal pregnancy, first trimester: Secondary | ICD-10-CM

## 2021-03-25 LAB — MOLECULAR ANCILLARY ONLY
Bacterial Vaginitis (gardnerella): POSITIVE — AB
Candida Glabrata: NEGATIVE
Candida Vaginitis: NEGATIVE
Chlamydia: NEGATIVE
Comment: NEGATIVE
Comment: NEGATIVE
Comment: NEGATIVE
Comment: NEGATIVE
Comment: NEGATIVE
Comment: NORMAL
Neisseria Gonorrhea: NEGATIVE
Trichomonas: NEGATIVE

## 2021-03-26 ENCOUNTER — Other Ambulatory Visit: Payer: Medicaid Other

## 2021-03-26 ENCOUNTER — Other Ambulatory Visit: Payer: Self-pay

## 2021-03-26 DIAGNOSIS — Z3481 Encounter for supervision of other normal pregnancy, first trimester: Secondary | ICD-10-CM

## 2021-03-27 DIAGNOSIS — F17200 Nicotine dependence, unspecified, uncomplicated: Secondary | ICD-10-CM | POA: Diagnosis not present

## 2021-03-28 ENCOUNTER — Telehealth: Payer: Self-pay | Admitting: Emergency Medicine

## 2021-03-28 ENCOUNTER — Telehealth (HOSPITAL_COMMUNITY): Payer: Self-pay | Admitting: Emergency Medicine

## 2021-03-28 ENCOUNTER — Ambulatory Visit
Admission: EM | Admit: 2021-03-28 | Discharge: 2021-03-28 | Disposition: A | Discharge status: Home / Self Care | Payer: Medicaid Other | Attending: Internal Medicine | Admitting: Internal Medicine

## 2021-03-28 ENCOUNTER — Encounter: Payer: Self-pay | Admitting: Emergency Medicine

## 2021-03-28 ENCOUNTER — Other Ambulatory Visit: Payer: Self-pay

## 2021-03-28 DIAGNOSIS — N898 Other specified noninflammatory disorders of vagina: Secondary | ICD-10-CM | POA: Insufficient documentation

## 2021-03-28 NOTE — Telephone Encounter (Signed)
Received notification that patient was looking for her cytology results from 3/10.  Called down to lab who states they never received a specimen.  Called and updated patient and encouraged return visit for recollect, she verbalized understanding ?

## 2021-03-28 NOTE — Telephone Encounter (Signed)
Patient came to [facility] asking about lab results she had seen in my chart.  Patient had u-preg, urine-dip and cytology swab at prior ucc visit.  Rolly Salter, [place] present to review results.  Positive urine preg.  Cytology pending.  Reported this to patient.  Patient pulled up my chart on cell phone.  Questions related to blood work in my chart.  Instructed to call pcp.  Reminded patient she has an appt with autry-lott on 3/21 at 2:50 pm.  Patient states she is aware and will call that office about blood work questions ?

## 2021-03-28 NOTE — ED Triage Notes (Signed)
Patient has been sent to ucc for recollect of cytology swab ?

## 2021-03-28 NOTE — Discharge Instructions (Signed)
Keep appt with pcp ?

## 2021-03-29 ENCOUNTER — Telehealth (HOSPITAL_COMMUNITY): Payer: Self-pay | Admitting: Emergency Medicine

## 2021-03-29 DIAGNOSIS — F17200 Nicotine dependence, unspecified, uncomplicated: Secondary | ICD-10-CM | POA: Diagnosis not present

## 2021-03-29 LAB — URINE CULTURE, OB REFLEX

## 2021-03-29 LAB — CERVICOVAGINAL ANCILLARY ONLY
Bacterial Vaginitis (gardnerella): POSITIVE — AB
Candida Glabrata: NEGATIVE
Candida Vaginitis: NEGATIVE
Chlamydia: NEGATIVE
Comment: NEGATIVE
Comment: NEGATIVE
Comment: NEGATIVE
Comment: NEGATIVE
Comment: NEGATIVE
Comment: NORMAL
Neisseria Gonorrhea: NEGATIVE
Trichomonas: NEGATIVE

## 2021-03-29 LAB — HCV INTERPRETATION

## 2021-03-29 LAB — CBC/D/PLT+RPR+RH+ABO+RUBIGG...
Antibody Screen: NEGATIVE
Basophils Absolute: 0.1 10*3/uL (ref 0.0–0.2)
Basos: 1 %
Bilirubin, UA: NEGATIVE
EOS (ABSOLUTE): 0.1 10*3/uL (ref 0.0–0.4)
Eos: 1 %
Glucose, UA: NEGATIVE
HCV Ab: NONREACTIVE
HIV Screen 4th Generation wRfx: NONREACTIVE
Hematocrit: 35.5 % (ref 34.0–46.6)
Hemoglobin: 12 g/dL (ref 11.1–15.9)
Hepatitis B Surface Ag: NEGATIVE
Immature Grans (Abs): 0 10*3/uL (ref 0.0–0.1)
Immature Granulocytes: 0 %
Ketones, UA: NEGATIVE
Leukocytes,UA: NEGATIVE
Lymphocytes Absolute: 2 10*3/uL (ref 0.7–3.1)
Lymphs: 30 %
MCH: 30.7 pg (ref 26.6–33.0)
MCHC: 33.8 g/dL (ref 31.5–35.7)
MCV: 91 fL (ref 79–97)
Monocytes Absolute: 0.4 10*3/uL (ref 0.1–0.9)
Monocytes: 6 %
Neutrophils Absolute: 4.1 10*3/uL (ref 1.4–7.0)
Neutrophils: 62 %
Nitrite, UA: NEGATIVE
Platelets: 267 10*3/uL (ref 150–450)
RBC: 3.91 x10E6/uL (ref 3.77–5.28)
RDW: 12.5 % (ref 11.7–15.4)
RPR Ser Ql: NONREACTIVE
Rh Factor: POSITIVE
Rubella Antibodies, IGG: 6.26 index (ref >0.99)
Specific Gravity, UA: 1.024 (ref 1.005–1.030)
Urobilinogen, Ur: 0.2 mg/dL (ref 0.2–1.0)
WBC: 6.7 10*3/uL (ref 3.4–10.8)
pH, UA: 5.5 (ref 5.0–7.5)

## 2021-03-29 LAB — HGB FRACTIONATION CASCADE
Hgb A2: 2.8 % (ref 1.8–3.2)
Hgb A: 97.2 % (ref 96.4–98.8)
Hgb F: 0 % (ref 0.0–2.0)
Hgb S: 0 %

## 2021-03-29 LAB — MICROSCOPIC EXAMINATION
Casts: NONE SEEN /lpf
Epithelial Cells (non renal): >10 /hpf — AB (ref 0–10)

## 2021-03-29 MED ORDER — METRONIDAZOLE 0.75 % VA GEL: 1.0000 | Freq: Every day | VAGINAL | 0 refills | Status: AC

## 2021-04-02 ENCOUNTER — Encounter (HOSPITAL_COMMUNITY): Payer: Self-pay | Admitting: Obstetrics and Gynecology

## 2021-04-02 ENCOUNTER — Encounter: Payer: Self-pay | Admitting: Family Medicine

## 2021-04-02 ENCOUNTER — Other Ambulatory Visit: Payer: Self-pay

## 2021-04-02 ENCOUNTER — Ambulatory Visit (INDEPENDENT_AMBULATORY_CARE_PROVIDER_SITE_OTHER): Payer: Medicaid Other | Admitting: Family Medicine

## 2021-04-02 ENCOUNTER — Inpatient Hospital Stay (HOSPITAL_COMMUNITY)
Admission: AD | Admit: 2021-04-02 | Discharge: 2021-04-03 | Disposition: A | Discharge status: Home / Self Care | Payer: Medicaid Other | Attending: Obstetrics and Gynecology | Admitting: Obstetrics and Gynecology

## 2021-04-02 VITALS — BP 95/70 | HR 102 | Temp 99.0°F | Wt 137.2 lb

## 2021-04-02 DIAGNOSIS — Z3A01 Less than 8 weeks gestation of pregnancy: Secondary | ICD-10-CM | POA: Diagnosis not present

## 2021-04-02 DIAGNOSIS — R102 Pelvic and perineal pain: Secondary | ICD-10-CM | POA: Diagnosis not present

## 2021-04-02 DIAGNOSIS — O3680X0 Pregnancy with inconclusive fetal viability, not applicable or unspecified: Secondary | ICD-10-CM | POA: Insufficient documentation

## 2021-04-02 DIAGNOSIS — O219 Vomiting of pregnancy, unspecified: Secondary | ICD-10-CM | POA: Insufficient documentation

## 2021-04-02 DIAGNOSIS — Z3490 Encounter for supervision of normal pregnancy, unspecified, unspecified trimester: Secondary | ICD-10-CM

## 2021-04-02 DIAGNOSIS — O26891 Other specified pregnancy related conditions, first trimester: Secondary | ICD-10-CM | POA: Insufficient documentation

## 2021-04-02 DIAGNOSIS — R1030 Lower abdominal pain, unspecified: Secondary | ICD-10-CM | POA: Diagnosis not present

## 2021-04-02 DIAGNOSIS — F39 Unspecified mood [affective] disorder: Secondary | ICD-10-CM

## 2021-04-02 DIAGNOSIS — Z87898 Personal history of other specified conditions: Secondary | ICD-10-CM

## 2021-04-02 DIAGNOSIS — Z6710 Type A blood, Rh positive: Secondary | ICD-10-CM | POA: Diagnosis not present

## 2021-04-02 DIAGNOSIS — M545 Low back pain, unspecified: Secondary | ICD-10-CM | POA: Diagnosis not present

## 2021-04-02 LAB — URINALYSIS, ROUTINE W REFLEX MICROSCOPIC
Bilirubin Urine: NEGATIVE
Glucose, UA: NEGATIVE mg/dL
Ketones, ur: NEGATIVE mg/dL
Leukocytes,Ua: NEGATIVE
Nitrite: NEGATIVE
Protein, ur: NEGATIVE mg/dL
Specific Gravity, Urine: 1.025 (ref 1.005–1.030)
pH: 5 (ref 5.0–8.0)

## 2021-04-02 MED ORDER — PRENATAL 28-0.8 MG PO TABS: 1.0000 | ORAL_TABLET | Freq: Every day | ORAL | 11 refills | Status: DC
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
## 2021-04-02 NOTE — Patient Instructions (Addendum)
?Therapy and Counseling Resources ?Most providers on this list will take Medicaid. Patients with commercial insurance or Medicare should contact their insurance company to get a list of in network providers. ? ?[facility] (spanish speaking therapist available)(habla espanol)  ?[address], [place], [place] [address], [place] ?[email or web address] ?[phone] ? ?[facility] ?[address] [place], [place] [address] ?[phone] ? ?[facility] ? [address]   [place], [place] [address]      [phone] ? ?[facility] ?[address]  [place], [place] [address] ?[phone] ? ?[facility] ?[address]  [place], [place] [address]       [phone]    ? ?[facility] (virtual only) ?[phone] ? ?[facility] ?[address], [place], [place] [phone] ? ?[facility]:  [address] [place] [phone] ? ?[facility]:  ?[address], [place] [phone] ? ?[facility] (under & uninsured) ?[address]   [place] [phone]    [email or web address]   ? ?[facility] ?[address]  [place]    [phone] ? ?[facility] ?[place] -[address]     Phone:  [phone]     [place]-  [address]  [phone]  ? ?[facility] ?[address]      [place], [place] [phone] ext [phone] ? ?[facility]: [address], [place], [place]  [phone]  ? ?[facility] (Spanish therapist) [email or web address]  ?[address]   [place] [address]    [phone]   ? ?[facility]   ?[address],  [place], [place]  [phone]  ? ?[facility]  ?[address] [place]  [phone] ? ?[facility]  ?[address]  [place]        [phone] ? ?Open Access/Walk In Clinic under & uninsured ? ?[facility]  ?[address] [place], [place] ?[place] [phone] ?[place]risis [phone] ? ?[facility] [place],  ?(Spanish)   [address], [place]: ([phone]) 8:30 - 12; 1 - 2:30 ? ?[facility] [place],  ?[address], [place]    ([phone]):8:30 - 12; 2 - 3PM ? ?[facility] [place],  ?[address],  [place]; ([phone]):   Mon - Fri 8 AM - 5 PM ? ?[facility] ?[address] [place]  MWF 12:30 to 3:00 or call to schedule an appointment  [phone] ? ?Specific Provider options ?[facility]  [email or web address].[email or web address]/us ?click on find a therapist  ?enter your zip code ?left side and select or tailor a therapist for your specific need.  ? ?[facility] Provider Directory ?[email or web address]  (Medicaid)   Follow all drop down to find a provider ? ?Social Support program ?[facility] ?[phone] or www.[email or web address] ?[address], [place], [place] Recovery support and educational  ? ?24- Hour Availability:  ? ?[facility]  ?[address] [place],  ?[place] [phone] ?[place]risis [phone] ? ?[facility]  Crisis Line [phone] ? ?[facility]  [phone]  ? ?[facility] [place] [facility]  1-866-261-5769 (after hours) ? ?Therapeutic Alternative/Mobile Crisis   1-877-626-1772 ? ?[place] National Suicide Hotline  1-800-273-8255 (TALK) ? ?Call 911 or go to   emergency room ? ?[facility] Crisis [facility]  (800-256-2452);  [place]  ? ?[facility] ACCESS  ?([phone]); [place], [place], [place], [place], [place], [place], [place] ? ? ?
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
## 2021-04-02 NOTE — Progress Notes (Signed)
Pt called for Triage and not in lobby 

## 2021-04-03 ENCOUNTER — Inpatient Hospital Stay (HOSPITAL_COMMUNITY): Payer: Medicaid Other

## 2021-04-03 ENCOUNTER — Telehealth: Payer: Self-pay | Admitting: Family Medicine

## 2021-04-03 DIAGNOSIS — M545 Low back pain, unspecified: Secondary | ICD-10-CM | POA: Diagnosis not present

## 2021-04-03 DIAGNOSIS — O219 Vomiting of pregnancy, unspecified: Secondary | ICD-10-CM

## 2021-04-03 DIAGNOSIS — Z3A01 Less than 8 weeks gestation of pregnancy: Secondary | ICD-10-CM | POA: Diagnosis not present

## 2021-04-03 DIAGNOSIS — O26891 Other specified pregnancy related conditions, first trimester: Secondary | ICD-10-CM | POA: Diagnosis not present

## 2021-04-03 DIAGNOSIS — R1030 Lower abdominal pain, unspecified: Secondary | ICD-10-CM | POA: Diagnosis not present

## 2021-04-03 LAB — CBC
HCT: 36.9 % (ref 36.0–46.0)
Hemoglobin: 12.9 g/dL (ref 12.0–15.0)
MCH: 31.8 pg (ref 26.0–34.0)
MCHC: 35 g/dL (ref 30.0–36.0)
MCV: 90.9 fL (ref 80.0–100.0)
Platelets: 288 10*3/uL (ref 150–400)
RBC: 4.06 MIL/uL (ref 3.87–5.11)
RDW: 12.4 % (ref 11.5–15.5)
WBC: 8.3 10*3/uL (ref 4.0–10.5)
nRBC: 0 % (ref 0.0–0.2)

## 2021-04-03 LAB — HCG, QUANTITATIVE, PREGNANCY: hCG, Beta Chain, Quant, S: 35224 m[IU]/mL — ABNORMAL HIGH (ref <5)

## 2021-04-03 MED ORDER — ONDANSETRON 4 MG PO TBDP
8.0000 mg | ORAL_TABLET | Freq: Once | ORAL | Status: AC
  Administered 2021-04-03: 8 mg via ORAL
  Filled 2021-04-03: qty 2

## 2021-04-03 MED ORDER — ACETAMINOPHEN 500 MG PO TABS
1000.0000 mg | ORAL_TABLET | Freq: Once | ORAL | Status: AC
Start: 2021-04-03 — End: 2021-04-03
  Administered 2021-04-03: 1000 mg via ORAL
  Filled 2021-04-03: qty 2

## 2021-04-03 MED ORDER — PROMETHAZINE HCL 25 MG PO TABS: 25.0000 mg | ORAL_TABLET | Freq: Four times a day (QID) | ORAL | 0 refills | Status: DC | PRN

## 2021-04-03 MED ORDER — LACTATED RINGERS IV BOLUS: 1000.0000 mL | Freq: Once | INTRAVENOUS | Status: DC

## 2021-04-03 MED ORDER — SODIUM CHLORIDE 0.9 % IV SOLN
12.5000 mg | Freq: Once | INTRAVENOUS | Status: DC
  Filled 2021-04-03: qty 0.5

## 2021-04-03 NOTE — MAU Provider Note (Signed)
?History  ?  ? ?CSN: [id number] ? ?Arrival date and time: 04/02/21 2220 ? ? Event Date/Time  ? First Provider Initiated Contact with Patient 04/03/21 0020   ?  ? ?Chief Complaint  ?Patient presents with  ?? Abdominal Pain  ?? Back Pain  ? ?Pelvic Pain ?The patient's primary symptoms include pelvic pain. This is a new problem. The current episode started today. The problem occurs constantly. The problem has been unchanged. The problem affects both sides. She is pregnant. Associated symptoms include vomiting. The vaginal discharge was normal. There has been no bleeding. Nothing aggravates the symptoms. She has tried nothing for the symptoms.  ?Emesis  ?This is a new problem. The current episode started today. The problem occurs 2 to 4 times per day. The problem has been unchanged. The emesis has an appearance of stomach contents. There has been no fever. Risk factors: pregnancy. She has tried nothing for the symptoms.  ? ?OB History   ? ? Gravida  ?10  ? Para  ?4  ? Term  ?4  ? Preterm  ?0  ? AB  ?5  ? Living  ?4  ?  ? ? SAB  ?1  ? IAB  ?4  ? Ectopic  ?0  ? Multiple  ?   ? Live Births  ?4  ?   ?  ?  ? ? ?Past Medical History:  ?Diagnosis Date  ?? Chlamydia   ?? Hypertension   ? During second pregnancy  ?? Mental disorder 2010  ? bipolar  ?? Pneumonia   ?? Trichimoniasis   ? ? ?Past Surgical History:  ?Procedure Laterality Date  ?? INDUCED ABORTION    ?? MOUTH SURGERY    ? as a child  ?? WISDOM TOOTH EXTRACTION    ? ? ?Family History  ?Problem Relation Age of Onset  ?? Hypertension Mother   ?? Cancer Mother   ?? Fibroids Mother   ?? Healthy Father   ?? ADD / ADHD Brother   ?? Hypertension Maternal Grandmother   ?? Anesthesia problems Neg Hx   ? ? ?Social History  ? ?Tobacco Use  ?? Smoking status: Never  ?? Smokeless tobacco: Never  ?Vaping Use  ?? Vaping Use: Never used  ?Substance Use Topics  ?? Alcohol use: Not Currently  ?? Drug use: Not Currently  ?  Types: Marijuana, Cocaine  ?  Comment: last smoked marijuana and  used cocaine before last wk.  ? ? ?Allergies:  ?Allergies  ?Allergen Reactions  ?? Penicillins Anaphylaxis  ?  Has taken cephalosprorins ?Has patient had a PCN reaction causing immediate rash, facial/tongue/throat swelling, SOB or lightheadedness with hypotension: Yes ?Has patient had a PCN reaction causing severe rash involving mucus membranes or skin necrosis: Yes ?Has patient had a PCN reaction that required hospitalization Yes ?Has patient had a PCN reaction occurring within the last 10 years: No ?If all of the above answers are "NO", then may proceed with Cephalosporin use.  ?? Penicillin G Swelling  ?? Latex Itching and Rash  ?? Latex Rash  ? ? ?Medications Prior to Admission  ?Medication Sig Dispense Refill Last Dose  ?? metroNIDAZOLE (METROGEL VAGINAL) 0.75 % vaginal gel Place 1 Applicatorful vaginally at bedtime for 5 days. 50 g 0 04/01/2021  ?? Prenatal 28-0.8 MG TABS Take 1 tablet by mouth daily. 90 tablet 11   ? ? ?Review of Systems  ?Gastrointestinal:  Positive for vomiting.  ?Genitourinary:  Positive for pelvic pain.  ?All other systems reviewed  and are negative. ?Physical Exam  ? ?Blood pressure 112/80, pulse 99, resp. rate 18, height 5\' 2"  (1.575 m), weight 62.6 kg, last menstrual period 02/27/2021, SpO2 100 %. ? ?Physical Exam ?Constitutional:   ?   Appearance: She is well-developed.  ?HENT:  ?   Head: Normocephalic.  ?Eyes:  ?   Pupils: Pupils are equal, round, and reactive to light.  ?Cardiovascular:  ?   Rate and Rhythm: Normal rate and regular rhythm.  ?   Heart sounds: Normal heart sounds.  ?Pulmonary:  ?   Effort: Pulmonary effort is normal. No respiratory distress.  ?   Breath sounds: Normal breath sounds.  ?Abdominal:  ?   Palpations: Abdomen is soft.  ?   Tenderness: There is no abdominal tenderness.  ?Genitourinary: ?   Vagina: No bleeding. Vaginal discharge: mucusy. ?   Comments: External: no lesion ?Vagina: small amount of white discharge ?  ? ? ?Musculoskeletal:     ?   General: Normal  range of motion.  ?   Cervical back: Normal range of motion and neck supple.  ?Skin: ?   General: Skin is warm and dry.  ?Neurological:  ?   Mental Status: She is alert and oriented to person, place, and time.  ?Psychiatric:     ?   Mood and Affect: Mood normal.     ?   Behavior: Behavior normal.  ? ? ?Results for orders placed or performed during the hospital encounter of 04/02/21 (from the past 24 hour(s))  ?Urinalysis, Routine w reflex microscopic Urine, Clean Catch     Status: Abnormal  ? Collection Time: 04/02/21 10:47 PM  ?Result Value Ref Range  ? Color, Urine YELLOW YELLOW  ? APPearance HAZY (A) CLEAR  ? Specific Gravity, Urine 1.025 1.005 - 1.030  ? pH 5.0 5.0 - 8.0  ? Glucose, UA NEGATIVE NEGATIVE mg/dL  ? Hgb urine dipstick MODERATE (A) NEGATIVE  ? Bilirubin Urine NEGATIVE NEGATIVE  ? Ketones, ur NEGATIVE NEGATIVE mg/dL  ? Protein, ur NEGATIVE NEGATIVE mg/dL  ? Nitrite NEGATIVE NEGATIVE  ? Leukocytes,Ua NEGATIVE NEGATIVE  ? RBC / HPF 6-10 0 - 5 RBC/hpf  ? WBC, UA 0-5 0 - 5 WBC/hpf  ? Bacteria, UA RARE (A) NONE SEEN  ? Squamous Epithelial / LPF 11-20 0 - 5  ? Mucus PRESENT   ?CBC     Status: None  ? Collection Time: 04/02/21 11:54 PM  ?Result Value Ref Range  ? WBC 8.3 4.0 - 10.5 K/uL  ? RBC 4.06 3.87 - 5.11 MIL/uL  ? Hemoglobin 12.9 12.0 - 15.0 g/dL  ? HCT 36.9 36.0 - 46.0 %  ? MCV 90.9 80.0 - 100.0 fL  ? MCH 31.8 26.0 - 34.0 pg  ? MCHC 35.0 30.0 - 36.0 g/dL  ? RDW 12.4 11.5 - 15.5 %  ? Platelets 288 150 - 400 K/uL  ? nRBC 0.0 0.0 - 0.2 %  ?hCG, quantitative, pregnancy     Status: Abnormal  ? Collection Time: 04/02/21 11:54 PM  ?Result Value Ref Range  ? hCG, Beta Chain, Quant, S 35,224 (H) <5 mIU/mL  ? ?US OB LESS THAN 14 WEEKS WITH OB TRANSVAGINAL ? ?Result Date: 04/03/2021 ?CLINICAL DATA:  Lower abdominal pain and back pain for 1 week. Last menstrual period 02/27/2021. Estimated due date by last menstrual period 12/04/2021. Estimated gestational age by last menstrual period 5 weeks and 0 days. EXAM:  OBSTETRIC <14 WK US AND TRANSVAGINAL OB US TECHNIQUE: Both transabdominal and  transvaginal ultrasound examinations were performed for complete evaluation of the gestation as well as the maternal uterus, adnexal regions, and pelvic cul-de-sac. Transvaginal technique was performed to assess early pregnancy. COMPARISON:  None. FINDINGS: Intrauterine gestational sac: Single Yolk sac:  Not Visualized. Embryo:  Not Visualized. Cardiac Activity: Not Visualized. MSD: 12  mm   5 w   6  d Subchorionic hemorrhage:  None visualized. Maternal uterus/adnexae: Bilateral necks are unremarkable. A corpus luteum cyst is noted on the left. Other: No free fluid. IMPRESSION: Probable early intrauterine gestational sac, but no yolk sac, fetal pole, or cardiac activity yet visualized. Recommend follow-up quantitative B-HCG levels and follow-up US in 14 days to assess viability. This recommendation follows SRU consensus guidelines: Diagnostic Criteria for Nonviable Pregnancy Early in the First Trimester. Malva Limes Med 2013; 256:3893-73. Electronically Signed   By: Tish Frederickson M.D.   On: 04/03/2021 00:57   ? ? ?MAU Course  ?Procedures ? ?MDM ?Attempted to start IV and give fluids and meds for nausea. First attempt was not successful and patient declines any further attempts. She is requesting Zofran for nausea. Will give Zofran. Nausea and vomiting have improved. Patient given tylenol for headache and cramping.  ? ?0119 AM: DW Dr. Jolayne Panther, will get FU HCG in 48 hours and then have patient keep [place] appt that is already scheduled for 04/09/2021.  ? ?Assessment and Plan  ? ?1. Nausea/vomiting in pregnancy   ?2. [redacted] weeks gestation of pregnancy   ?3. Type A blood, Rh positive   ?4. Pregnancy of unknown anatomic location   ?5. Pelvic pain in pregnancy, antepartum, first trimester   ? ?DC home in stable condition  ?1st Trimester precautions  ?Bleeding precautions ?Ectopic precautions ?RX: phenergan 25mg  PRN #[id number]  ?Return to MAU as needed ?FU for  blood work in 2 days ?Keep appt for 04/09/2021 ? ? Follow-up Information   ? ? [facility] Follow up.   ?Specialty: Obstetrics and Gynecology ?Why: FU

## 2021-04-03 NOTE — MAU Note (Addendum)
Attempted IV x 1 without success. Pt does not want the IV and will try something by mouth for nausea. Pt requested ice chips and crackers but has actively thrown up few times in MAU. Talked with pt about trying nausea med first and then see if she can tolerate po. Agrees with plan. Thressa Sheller CNM aware pt refuses IV and will order nausea meds before u/s. Pt also mentions she has been SOB about a wk with walking. CNM aware. No new orders ?

## 2021-04-03 NOTE — Progress Notes (Addendum)
Written and verbal d/c instructions given and understanding voiced. FOB, Clinton, picked up pt for d/c. Pt requested he be present when provider discussed u/s report and d/c plan. Marcille Buffy CNM and RN present when d/c instructions reviewed.  ?

## 2021-04-05 ENCOUNTER — Ambulatory Visit: Payer: Medicaid Other | Admitting: *Deleted

## 2021-04-05 ENCOUNTER — Other Ambulatory Visit: Payer: Self-pay

## 2021-04-05 ENCOUNTER — Ambulatory Visit: Payer: Medicaid Other

## 2021-04-05 ENCOUNTER — Telehealth: Payer: Self-pay | Admitting: Obstetrics and Gynecology

## 2021-04-05 VITALS — BP 111/74 | HR 101 | Ht 62.0 in

## 2021-04-05 DIAGNOSIS — O3680X0 Pregnancy with inconclusive fetal viability, not applicable or unspecified: Secondary | ICD-10-CM

## 2021-04-05 DIAGNOSIS — Z3A01 Less than 8 weeks gestation of pregnancy: Secondary | ICD-10-CM

## 2021-04-05 LAB — BETA HCG QUANT (REF LAB): hCG Quant: 56354 m[IU]/mL

## 2021-04-05 NOTE — Telephone Encounter (Signed)
F/u bhcg has resulted. Presented to our MAU 2 days ago with crampy periumbilical abdominal pain. Hcg at that time 35,000. U/s showed probable but not definite iup. HCG today shows good rise to 56,000. Patient reports her abdominal pain persists intermittently but has improved. No vaginal bleeding. She has a repeat u/s scheduled for 3/28. I advised that so long as pain continues to improve reasonable to wait until u/s on 3/28. If pain worsens I advised returning promptly to our MAU for evaluation. ?

## 2021-04-05 NOTE — Progress Notes (Signed)
It appears that patient was notified by nursing staff and was prescribed vaginal flagyl for treatment.

## 2021-04-05 NOTE — Progress Notes (Signed)
Pt here for Stat BHCG following MAU visit on 3/22.  She reports having continuous abdominal pain, feels weak, has nausea and vomiting and increased saliva. Pt states she has been feeling bad for 3 days. Lab drawn and pt was advised she will be notified of results today as well as next steps of care. She was instructed to go to MAU if she develops heavy vaginal bleeding or worsening abdominal/pelvic pain. Pt voiced understanding of instructions and stated that a Mychart message can be sent if she does not answer the call regarding results.  ? ? ? ? ?

## 2021-04-08 ENCOUNTER — Inpatient Hospital Stay (HOSPITAL_COMMUNITY): Payer: Medicaid Other

## 2021-04-08 ENCOUNTER — Encounter (HOSPITAL_COMMUNITY): Payer: Self-pay | Admitting: Obstetrics and Gynecology

## 2021-04-08 ENCOUNTER — Other Ambulatory Visit: Payer: Self-pay

## 2021-04-08 ENCOUNTER — Inpatient Hospital Stay (HOSPITAL_COMMUNITY)
Admission: AD | Admit: 2021-04-08 | Discharge: 2021-04-08 | Disposition: A | Discharge status: Home / Self Care | Payer: Medicaid Other | Attending: Family Medicine | Admitting: Family Medicine

## 2021-04-08 DIAGNOSIS — O99321 Drug use complicating pregnancy, first trimester: Secondary | ICD-10-CM | POA: Diagnosis not present

## 2021-04-08 DIAGNOSIS — N3001 Acute cystitis with hematuria: Secondary | ICD-10-CM | POA: Insufficient documentation

## 2021-04-08 DIAGNOSIS — O2311 Infections of bladder in pregnancy, first trimester: Secondary | ICD-10-CM | POA: Diagnosis not present

## 2021-04-08 DIAGNOSIS — F1490 Cocaine use, unspecified, uncomplicated: Secondary | ICD-10-CM | POA: Diagnosis not present

## 2021-04-08 DIAGNOSIS — K5900 Constipation, unspecified: Secondary | ICD-10-CM | POA: Diagnosis not present

## 2021-04-08 DIAGNOSIS — O23591 Infection of other part of genital tract in pregnancy, first trimester: Secondary | ICD-10-CM | POA: Diagnosis not present

## 2021-04-08 DIAGNOSIS — F1290 Cannabis use, unspecified, uncomplicated: Secondary | ICD-10-CM | POA: Diagnosis not present

## 2021-04-08 DIAGNOSIS — O210 Mild hyperemesis gravidarum: Secondary | ICD-10-CM | POA: Diagnosis not present

## 2021-04-08 DIAGNOSIS — Z3A01 Less than 8 weeks gestation of pregnancy: Secondary | ICD-10-CM | POA: Insufficient documentation

## 2021-04-08 DIAGNOSIS — F1491 Cocaine use, unspecified, in remission: Secondary | ICD-10-CM

## 2021-04-08 DIAGNOSIS — O3680X0 Pregnancy with inconclusive fetal viability, not applicable or unspecified: Secondary | ICD-10-CM

## 2021-04-08 DIAGNOSIS — B9689 Other specified bacterial agents as the cause of diseases classified elsewhere: Secondary | ICD-10-CM | POA: Insufficient documentation

## 2021-04-08 DIAGNOSIS — O99611 Diseases of the digestive system complicating pregnancy, first trimester: Secondary | ICD-10-CM | POA: Diagnosis not present

## 2021-04-08 DIAGNOSIS — O26891 Other specified pregnancy related conditions, first trimester: Secondary | ICD-10-CM | POA: Diagnosis not present

## 2021-04-08 LAB — CBC WITH DIFFERENTIAL/PLATELET
Abs Immature Granulocytes: 0.02 10*3/uL (ref 0.00–0.07)
Basophils Absolute: 0 10*3/uL (ref 0.0–0.1)
Basophils Relative: 1 %
Eosinophils Absolute: 0 10*3/uL (ref 0.0–0.5)
Eosinophils Relative: 1 %
HCT: 40.1 % (ref 36.0–46.0)
Hemoglobin: 14 g/dL (ref 12.0–15.0)
Immature Granulocytes: 0 %
Lymphocytes Relative: 19 %
Lymphs Abs: 1.6 10*3/uL (ref 0.7–4.0)
MCH: 31 pg (ref 26.0–34.0)
MCHC: 34.9 g/dL (ref 30.0–36.0)
MCV: 88.9 fL (ref 80.0–100.0)
Monocytes Absolute: 0.6 10*3/uL (ref 0.1–1.0)
Monocytes Relative: 7 %
Neutro Abs: 5.9 10*3/uL (ref 1.7–7.7)
Neutrophils Relative %: 72 %
Platelets: 302 10*3/uL (ref 150–400)
RBC: 4.51 MIL/uL (ref 3.87–5.11)
RDW: 11.9 % (ref 11.5–15.5)
WBC: 8.1 10*3/uL (ref 4.0–10.5)
nRBC: 0 % (ref 0.0–0.2)

## 2021-04-08 LAB — URINALYSIS, ROUTINE W REFLEX MICROSCOPIC
Bilirubin Urine: NEGATIVE
Glucose, UA: NEGATIVE mg/dL
Ketones, ur: 5 mg/dL — AB
Leukocytes,Ua: NEGATIVE
Nitrite: NEGATIVE
Protein, ur: 30 mg/dL — AB
Specific Gravity, Urine: 1.03 (ref 1.005–1.030)
Squamous Epithelial / HPF: >50 — ABNORMAL HIGH (ref 0–5)
pH: 5 (ref 5.0–8.0)

## 2021-04-08 LAB — COMPREHENSIVE METABOLIC PANEL
ALT: 14 U/L (ref 0–44)
AST: 18 U/L (ref 15–41)
Albumin: 4.2 g/dL (ref 3.5–5.0)
Alkaline Phosphatase: 45 U/L (ref 38–126)
Anion gap: 7 (ref 5–15)
BUN: 9 mg/dL (ref 6–20)
CO2: 24 mmol/L (ref 22–32)
Calcium: 9.7 mg/dL (ref 8.9–10.3)
Chloride: 105 mmol/L (ref 98–111)
Creatinine, Ser: 0.79 mg/dL (ref 0.44–1.00)
GFR, Estimated: >60 mL/min (ref >60)
Glucose, Bld: 116 mg/dL — ABNORMAL HIGH (ref 70–99)
Potassium: 3.8 mmol/L (ref 3.5–5.1)
Sodium: 136 mmol/L (ref 135–145)
Total Bilirubin: 0.4 mg/dL (ref 0.3–1.2)
Total Protein: 7.5 g/dL (ref 6.5–8.1)

## 2021-04-08 LAB — LIPASE, BLOOD: Lipase: 28 U/L (ref 11–51)

## 2021-04-08 MED ORDER — FAMOTIDINE 20 MG PO TABS: 20.0000 mg | ORAL_TABLET | Freq: Every day | ORAL | 1 refills | Status: DC

## 2021-04-08 MED ORDER — METRONIDAZOLE 0.75 % EX GEL: 1.0000 "application " | Freq: Every day | CUTANEOUS | 0 refills | Status: AC

## 2021-04-08 MED ORDER — FAMOTIDINE IN NACL 20-0.9 MG/50ML-% IV SOLN
20.0000 mg | Freq: Once | INTRAVENOUS | Status: AC
  Administered 2021-04-08: 20 mg via INTRAVENOUS
  Filled 2021-04-08: qty 50

## 2021-04-08 MED ORDER — LACTATED RINGERS IV BOLUS
2000.0000 mL | Freq: Once | INTRAVENOUS | Status: AC
  Administered 2021-04-08: 2000 mL via INTRAVENOUS

## 2021-04-08 MED ORDER — METRONIDAZOLE 0.75 % VA GEL: 1.0000 | Freq: Every day | VAGINAL | Status: DC

## 2021-04-08 MED ORDER — SODIUM CHLORIDE 0.9 % IV SOLN
8.0000 mg | Freq: Once | INTRAVENOUS | Status: AC
  Administered 2021-04-08: 8 mg via INTRAVENOUS
  Filled 2021-04-08: qty 4

## 2021-04-08 MED ORDER — CEFADROXIL 500 MG PO CAPS: 500.0000 mg | ORAL_CAPSULE | Freq: Two times a day (BID) | ORAL | 0 refills | Status: AC

## 2021-04-08 MED ORDER — GLYCOPYRROLATE 0.2 MG/ML IJ SOLN
0.2000 mg | Freq: Once | INTRAMUSCULAR | Status: AC
  Administered 2021-04-08: 0.2 mg via INTRAVENOUS
  Filled 2021-04-08: qty 1

## 2021-04-08 MED ORDER — ONDANSETRON HCL 4 MG PO TABS: 4.0000 mg | ORAL_TABLET | Freq: Three times a day (TID) | ORAL | 0 refills | Status: DC | PRN

## 2021-04-08 MED ORDER — POLYETHYLENE GLYCOL 3350 17 G PO PACK: 17.0000 g | PACK | Freq: Every day | ORAL | 0 refills | Status: DC

## 2021-04-08 MED ORDER — CEFADROXIL 500 MG PO CAPS: 500.0000 mg | ORAL_CAPSULE | Freq: Two times a day (BID) | ORAL | Status: DC

## 2021-04-08 NOTE — MAU Provider Note (Signed)
?History  ?  ? ?CSN: [id number] ? ?Arrival date and time: 04/08/21 0831 ? ? Event Date/Time  ? First Provider Initiated Contact with Patient 04/08/21 0912   ?  ? ?Chief Complaint  ?Patient presents with  ? Emesis  ? Nausea  ? ?Patient complaining of nausea and vomiting since Friday 2/24. Says that the emesis is yellow/clear/frothy. Says that she has felt warm but denies any known fevers. Denies any sick symptoms. She has not been able to hold anything down and has not tried the phenergran she was prescribed d/t not wanting to be sleepy at home with her kids. Does have a history of marijuana and cocaine use, last use for either was 2 weeks ago when she found out she was pregnant. Denies any acidic taste in mouth but does say that she feels worse when laying down. Has been having some lower abdominal pain and RUQ tenderness. Says that it is a sharp pain that comes and goes. Says that her urine has been darker than usual but denies any dysuria. Has been constipated and last BM was yesterday morning. Denies any vaginal bleeding, has been having some mucoid clear discharge but no irritation. Was diagnosed with BV last week and sent in metronidazole gel but did not complete the course (was worried it contributed to her abdominal discomfort). ? ? ? ?Emesis  ?The current episode started in the past 7 days. The problem has been unchanged. The emesis has an appearance of stomach contents. There has been no fever. Associated symptoms include abdominal pain. Pertinent negatives include no diarrhea.  ? ?OB History   ? ? Gravida  ?10  ? Para  ?4  ? Term  ?4  ? Preterm  ?0  ? AB  ?5  ? Living  ?4  ?  ? ? SAB  ?1  ? IAB  ?4  ? Ectopic  ?0  ? Multiple  ?   ? Live Births  ?4  ?   ?  ?  ? ? ?Past Medical History:  ?Diagnosis Date  ? Chlamydia   ? Hypertension   ? During second pregnancy  ? Mental disorder 2010  ? bipolar  ? Pneumonia   ? Trichimoniasis   ? ? ?Past Surgical History:  ?Procedure Laterality Date  ? INDUCED ABORTION    ?  MOUTH SURGERY    ? as a child  ? WISDOM TOOTH EXTRACTION    ? ? ?Family History  ?Problem Relation Age of Onset  ? Hypertension Mother   ? Cancer Mother   ? Fibroids Mother   ? Healthy Father   ? ADD / ADHD Brother   ? Hypertension Maternal Grandmother   ? Anesthesia problems Neg Hx   ? ? ?Social History  ? ?Tobacco Use  ? Smoking status: Never  ? Smokeless tobacco: Never  ?Vaping Use  ? Vaping Use: Never used  ?Substance Use Topics  ? Alcohol use: Not Currently  ? Drug use: Not Currently  ?  Types: Marijuana, Cocaine  ?  Comment: last used before she found out she was pregnant  ? ? ?Allergies:  ?Allergies  ?Allergen Reactions  ? Penicillins Anaphylaxis  ?  Has taken cephalosprorins ?Has patient had a PCN reaction causing immediate rash, facial/tongue/throat swelling, SOB or lightheadedness with hypotension: Yes ?Has patient had a PCN reaction causing severe rash involving mucus membranes or skin necrosis: Yes ?Has patient had a PCN reaction that required hospitalization Yes ?Has patient had a PCN reaction occurring within  the last 10 years: No ?If all of the above answers are "NO", then may proceed with Cephalosporin use.  ? Penicillin G Swelling  ? Latex Itching and Rash  ? Latex Rash  ? ? ?Medications Prior to Admission  ?Medication Sig Dispense Refill Last Dose  ? Prenatal 28-0.8 MG TABS Take 1 tablet by mouth daily. (Patient not taking: Reported on 04/05/2021) 90 tablet 11   ? promethazine (PHENERGAN) 25 MG tablet Take 1 tablet (25 mg total) by mouth every 6 (six) hours as needed for nausea or vomiting. (Patient not taking: Reported on 04/05/2021) 30 tablet 0   ? ? ?Review of Systems  ?Constitutional:  Positive for appetite change.  ?HENT:  Positive for rhinorrhea. Negative for congestion.   ?Respiratory:  Negative for shortness of breath.   ?Gastrointestinal:  Positive for abdominal pain, constipation, nausea and vomiting. Negative for abdominal distention and diarrhea.  ?Genitourinary:  Negative for dysuria and  vaginal bleeding.  ?Physical Exam  ? ?Blood pressure 105/77, pulse 91, temperature 98.4 ?F (36.9 ?C), temperature source Oral, resp. rate 19, height 5\' 2"  (1.575 m), weight 61 kg, last menstrual period 02/27/2021, SpO2 100 %. ? ?Physical Exam ?Constitutional:   ?   General: She is not in acute distress. ?   Appearance: She is ill-appearing. She is not toxic-appearing.  ?HENT:  ?   Head: Normocephalic and atraumatic.  ?   Mouth/Throat:  ?   Mouth: Mucous membranes are moist.  ?   Pharynx: No oropharyngeal exudate or posterior oropharyngeal erythema.  ?Eyes:  ?   Conjunctiva/sclera: Conjunctivae normal.  ?Cardiovascular:  ?   Rate and Rhythm: Normal rate and regular rhythm.  ?   Pulses: Normal pulses.  ?Pulmonary:  ?   Effort: Pulmonary effort is normal.  ?   Breath sounds: Normal breath sounds.  ?Abdominal:  ?   General: Bowel sounds are normal.  ?   Palpations: Abdomen is soft.  ?   Tenderness: There is abdominal tenderness.  ?   Comments: Tenderness in RUQ when palpating. Negative Murphy's, no rebound tenderness. No gaurding. No midline epigastric pain. Some pain on palpation near umbilicus. Some suprapubic tenderness to palpation  ?Musculoskeletal:  ?   Right lower leg: No edema.  ?   Left lower leg: No edema.  ?Skin: ?   General: Skin is warm and dry.  ?   Findings: No rash.  ?Neurological:  ?   Mental Status: She is alert.  ? ?FHT on u/s 116 ? ?Results for orders placed or performed during the hospital encounter of 04/08/21 (from the past 24 hour(s))  ?Urinalysis, Routine w reflex microscopic Urine, Clean Catch     Status: Abnormal  ? Collection Time: 04/08/21  8:47 AM  ?Result Value Ref Range  ? Color, Urine AMBER (A) YELLOW  ? APPearance CLOUDY (A) CLEAR  ? Specific Gravity, Urine 1.030 1.005 - 1.030  ? pH 5.0 5.0 - 8.0  ? Glucose, UA NEGATIVE NEGATIVE mg/dL  ? Hgb urine dipstick MODERATE (A) NEGATIVE  ? Bilirubin Urine NEGATIVE NEGATIVE  ? Ketones, ur 5 (A) NEGATIVE mg/dL  ? Protein, ur 30 (A) NEGATIVE mg/dL   ? Nitrite NEGATIVE NEGATIVE  ? Leukocytes,Ua NEGATIVE NEGATIVE  ? RBC / HPF 6-10 0 - 5 RBC/hpf  ? WBC, UA 0-5 0 - 5 WBC/hpf  ? Bacteria, UA MANY (A) NONE SEEN  ? Squamous Epithelial / LPF >50 (H) 0 - 5  ? Mucus PRESENT   ?CBC with Differential/Platelet  Status: None  ? Collection Time: 04/08/21  9:44 AM  ?Result Value Ref Range  ? WBC 8.1 4.0 - 10.5 K/uL  ? RBC 4.51 3.87 - 5.11 MIL/uL  ? Hemoglobin 14.0 12.0 - 15.0 g/dL  ? HCT 40.1 36.0 - 46.0 %  ? MCV 88.9 80.0 - 100.0 fL  ? MCH 31.0 26.0 - 34.0 pg  ? MCHC 34.9 30.0 - 36.0 g/dL  ? RDW 11.9 11.5 - 15.5 %  ? Platelets 302 150 - 400 K/uL  ? nRBC 0.0 0.0 - 0.2 %  ? Neutrophils Relative % 72 %  ? Neutro Abs 5.9 1.7 - 7.7 K/uL  ? Lymphocytes Relative 19 %  ? Lymphs Abs 1.6 0.7 - 4.0 K/uL  ? Monocytes Relative 7 %  ? Monocytes Absolute 0.6 0.1 - 1.0 K/uL  ? Eosinophils Relative 1 %  ? Eosinophils Absolute 0.0 0.0 - 0.5 K/uL  ? Basophils Relative 1 %  ? Basophils Absolute 0.0 0.0 - 0.1 K/uL  ? Immature Granulocytes 0 %  ? Abs Immature Granulocytes 0.02 0.00 - 0.07 K/uL  ?Comprehensive metabolic panel     Status: Abnormal  ? Collection Time: 04/08/21  9:44 AM  ?Result Value Ref Range  ? Sodium 136 135 - 145 mmol/L  ? Potassium 3.8 3.5 - 5.1 mmol/L  ? Chloride 105 98 - 111 mmol/L  ? CO2 24 22 - 32 mmol/L  ? Glucose, Bld 116 (H) 70 - 99 mg/dL  ? BUN 9 6 - 20 mg/dL  ? Creatinine, Ser 0.79 0.44 - 1.00 mg/dL  ? Calcium 9.7 8.9 - 10.3 mg/dL  ? Total Protein 7.5 6.5 - 8.1 g/dL  ? Albumin 4.2 3.5 - 5.0 g/dL  ? AST 18 15 - 41 U/L  ? ALT 14 0 - 44 U/L  ? Alkaline Phosphatase 45 38 - 126 U/L  ? Total Bilirubin 0.4 0.3 - 1.2 mg/dL  ? GFR, Estimated >60 >60 mL/min  ? Anion gap 7 5 - 15  ?Lipase, blood     Status: None  ? Collection Time: 04/08/21  9:44 AM  ?Result Value Ref Range  ? Lipase 28 11 - 51 U/L  ?  ?US OB Transvaginal ? ?Result Date: 04/08/2021 ?CLINICAL DATA:  Back pain, pregnant EXAM: OBSTETRIC <14 WK ULTRASOUND TECHNIQUE: Transabdominal ultrasound was performed for  evaluation of the gestation as well as the maternal uterus and adnexal regions. COMPARISON:  None. FINDINGS: Intrauterine gestational sac: Single gestational sac located at the left aspect of the uterine f

## 2021-04-08 NOTE — MAU Note (Signed)
...  Rachel Lloyd is a 36 y.o. at [redacted]w[redacted]d here in MAU reporting: N/V and constant spitting every day since being discharged. She states she has only been able to consistently keep orange juice down. Has not taken phenergan she's been prescribed. She is also endorsing a HA, lower abdominal pain, and lower back pain that began on Saturday. She states she took Tylenol last night but the HA returned this morning. Denies VB but endorses thick mucous like discharge.   ? ?Last PO: ?Orange juice around 0800 ?Broccoli and rice around 1830 ? ?Pain score:  ?8/10 HA  ?8/10 lower back ?8/10 lower abdomen ?  ?Lab orders placed from triage: UA ? ?

## 2021-04-09 ENCOUNTER — Ambulatory Visit (HOSPITAL_COMMUNITY): Admission: RE | Admit: 2021-04-09 | Payer: Medicaid Other | Source: Ambulatory Visit

## 2021-04-09 LAB — URINE CULTURE

## 2021-04-25 ENCOUNTER — Telehealth: Payer: Self-pay | Admitting: *Deleted

## 2021-04-25 NOTE — Telephone Encounter (Signed)
Patient called and states that she ended her pregnancy on Saturday 04/20/21 and had the procedure in office.  She states that she has been experiencing 8/10 pain most days and sometimes it does get to a 10/10.  The pain is in her abdomen and goes into her legs.  She is experiencing back pain, heavy bleeding and clots.  She is bleeding through overnights pads.  Patient states that she has tried calling the clinic where this was done but hasn't heard back from them. Patient was advised to seek care today at the MAU or ED.  She voiced understanding and will head that way.  Will forward to MD.  Burnard Hawthorne ? ? ? ?

## 2021-05-07 ENCOUNTER — Ambulatory Visit (INDEPENDENT_AMBULATORY_CARE_PROVIDER_SITE_OTHER): Payer: Medicaid Other | Admitting: Family Medicine

## 2021-05-07 VITALS — BP 100/60 | HR 93 | Wt 142.0 lb

## 2021-05-07 DIAGNOSIS — Z3009 Encounter for other general counseling and advice on contraception: Secondary | ICD-10-CM

## 2021-05-07 MED ORDER — NORETHINDRONE 0.35 MG PO TABS: 1.0000 | ORAL_TABLET | Freq: Every day | ORAL | 11 refills | Status: DC

## 2021-05-07 NOTE — Patient Instructions (Addendum)
It was wonderful to see you today. ? ?Please bring ALL of your medications with you to every visit.  ? ?Today we talked about: ? ?Starting the birth control pill while waiting to get nexplanon. If vaginal odor continues despite treatment please follow up for further testing.  ? ?Please be sure to schedule follow up at the front  desk before you leave today.  ? ?If you haven't already, sign up for My Chart to have easy access to your labs results, and communication with your primary care physician. ? ?Please call the clinic at [phone] if your symptoms worsen or you have any concerns. It was our pleasure to serve you. ? ?Dr. Salvadore Dom ? ?

## 2021-05-07 NOTE — Progress Notes (Signed)
? ? ?  SUBJECTIVE:  ? ?CHIEF COMPLAINT / HPI:  ? ?Encounter for birth control ?History of elective abortion 04/20/2021.  Was experiencing abdominal pain and heavy menstrual bleeding 4/13.  She states the bleeding has eased up.  Today she desires birth control in the form of Nexplanon.  Denies fever, abdominal pain, dysuria.  Does however continue to endorse vaginal odor.  She states she sought care for this and was given MetroGel.  She has not tried Benadryl at this time.  Declines pelvic swabs at this time. ? ?She denies past medical history of VTE.  She denies tobacco smoke. Has hx of migraine.  In the past that she has tried Mirena, the patch, OCPs, depo.  ? ?PERTINENT  PMH / PSH: As above.  ? ?OBJECTIVE:  ? ?BP 100/60   Pulse 93   Wt 142 lb (64.4 kg)   LMP 02/27/2021   SpO2 99%   BMI 25.97 kg/m?  ?General: Appears well, no acute distress. Age appropriate. ?Respiratory: normal effort ?Neuro: alert and oriented ?Psych: normal affect ? ?ASSESSMENT/PLAN:  ? ?Encounter for counseling regarding contraception ?Desires nexplanon. Will schedule this. Start micronor today. Elective AB 4/8. No continued abdominal pain and vaginal bleeding improving. Experiencing vaginal odor. Prescribed metrogel week prior, has not used this. Declines pelvic exam and swab today. Encouraged use and follow up if odor continues.  ? ?Lavonda Jumbo, DO ?[facility]  ?

## 2021-05-17 ENCOUNTER — Ambulatory Visit: Payer: Medicaid Other | Admitting: Family Medicine

## 2021-06-11 ENCOUNTER — Other Ambulatory Visit: Payer: Self-pay

## 2021-06-11 ENCOUNTER — Ambulatory Visit
Admission: RE | Admit: 2021-06-11 | Discharge: 2021-06-11 | Disposition: A | Discharge status: Home / Self Care | Payer: Medicaid Other | Source: Ambulatory Visit | Attending: Internal Medicine | Admitting: Internal Medicine

## 2021-06-11 VITALS — BP 129/87 | HR 86 | Temp 98.1°F | Resp 18

## 2021-06-11 DIAGNOSIS — Z113 Encounter for screening for infections with a predominantly sexual mode of transmission: Secondary | ICD-10-CM

## 2021-06-11 DIAGNOSIS — Z3202 Encounter for pregnancy test, result negative: Secondary | ICD-10-CM

## 2021-06-11 DIAGNOSIS — N898 Other specified noninflammatory disorders of vagina: Secondary | ICD-10-CM | POA: Diagnosis not present

## 2021-06-11 LAB — POCT URINALYSIS DIP (MANUAL ENTRY)
Bilirubin, UA: NEGATIVE
Glucose, UA: NEGATIVE mg/dL
Ketones, POC UA: NEGATIVE mg/dL
Leukocytes, UA: NEGATIVE
Nitrite, UA: NEGATIVE
Protein Ur, POC: NEGATIVE mg/dL
Spec Grav, UA: 1.015 (ref 1.010–1.025)
Urobilinogen, UA: 0.2 E.U./dL
pH, UA: 7 (ref 5.0–8.0)

## 2021-06-11 LAB — POCT URINE PREGNANCY: Preg Test, Ur: NEGATIVE
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
## 2021-06-11 NOTE — Discharge Instructions (Addendum)
Your urine test did not show signs of urinary tract infection.  Urine pregnancy was negative.  Your vaginal swab is pending.  We will call if anything is positive and treat as appropriate.

## 2021-06-11 NOTE — ED Triage Notes (Signed)
Pt here for vaginal discharge with odor and some burning; pt unsure if she could be pregnant

## 2021-06-12 ENCOUNTER — Telehealth (HOSPITAL_COMMUNITY): Payer: Self-pay | Admitting: Emergency Medicine

## 2021-06-12 LAB — CERVICOVAGINAL ANCILLARY ONLY
Bacterial Vaginitis (gardnerella): POSITIVE — AB
Candida Glabrata: NEGATIVE
Candida Vaginitis: NEGATIVE
Chlamydia: NEGATIVE
Comment: NEGATIVE
Comment: NEGATIVE
Comment: NEGATIVE
Comment: NEGATIVE
Comment: NEGATIVE
Comment: NORMAL
Neisseria Gonorrhea: NEGATIVE
Trichomonas: NEGATIVE

## 2021-06-12 MED ORDER — METRONIDAZOLE 500 MG PO TABS: 500.0000 mg | ORAL_TABLET | Freq: Two times a day (BID) | ORAL | 0 refills | Status: DC

## 2021-06-18 ENCOUNTER — Encounter: Payer: Self-pay | Admitting: *Deleted

## 2021-06-21 ENCOUNTER — Ambulatory Visit: Payer: Medicaid Other | Admitting: Family Medicine

## 2021-06-24 ENCOUNTER — Encounter: Payer: Self-pay | Admitting: Family Medicine

## 2021-06-25 MED ORDER — MICONAZOLE 7 100 MG VA SUPP: 100.0000 mg | Freq: Every day | VAGINAL | 0 refills | Status: DC

## 2021-07-04 DIAGNOSIS — Z20822 Contact with and (suspected) exposure to covid-19: Secondary | ICD-10-CM | POA: Diagnosis not present

## 2021-07-04 DIAGNOSIS — Z03818 Encounter for observation for suspected exposure to other biological agents ruled out: Secondary | ICD-10-CM | POA: Diagnosis not present

## 2021-07-27 ENCOUNTER — Ambulatory Visit: Payer: Self-pay

## 2021-08-12 ENCOUNTER — Ambulatory Visit
Admission: RE | Admit: 2021-08-12 | Discharge: 2021-08-12 | Disposition: A | Discharge status: Home / Self Care | Payer: Medicaid Other | Source: Ambulatory Visit | Attending: Physician Assistant | Admitting: Physician Assistant

## 2021-08-12 VITALS — BP 120/62 | HR 81 | Temp 98.8°F | Resp 18

## 2021-08-12 DIAGNOSIS — N898 Other specified noninflammatory disorders of vagina: Secondary | ICD-10-CM | POA: Diagnosis not present

## 2021-08-12 DIAGNOSIS — R8290 Unspecified abnormal findings in urine: Secondary | ICD-10-CM | POA: Insufficient documentation

## 2021-08-12 LAB — POCT URINALYSIS DIP (MANUAL ENTRY)
Bilirubin, UA: NEGATIVE
Glucose, UA: NEGATIVE mg/dL
Ketones, POC UA: NEGATIVE mg/dL
Leukocytes, UA: NEGATIVE
Nitrite, UA: NEGATIVE
Protein Ur, POC: NEGATIVE mg/dL
Spec Grav, UA: 1.025 (ref 1.010–1.025)
Urobilinogen, UA: 0.2 E.U./dL
pH, UA: 5.5 (ref 5.0–8.0)

## 2021-08-12 LAB — POCT URINE PREGNANCY: Preg Test, Ur: NEGATIVE
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
## 2021-08-12 NOTE — ED Triage Notes (Signed)
Pt presents to uc with co of vaginal oder, urinary oder, itch and white discharge for one week. Pt reports concern for sti or uti.

## 2021-08-12 NOTE — Discharge Instructions (Signed)
Pregnancy test negative.   Urine looks clear- no infection. Culture ordered.   Await other screening for further recommendation. Check MyChart for results.

## 2021-08-13 LAB — CERVICOVAGINAL ANCILLARY ONLY
Bacterial Vaginitis (gardnerella): POSITIVE — AB
Candida Glabrata: NEGATIVE
Candida Vaginitis: NEGATIVE
Chlamydia: NEGATIVE
Comment: NEGATIVE
Comment: NEGATIVE
Comment: NEGATIVE
Comment: NEGATIVE
Comment: NEGATIVE
Comment: NORMAL
Neisseria Gonorrhea: NEGATIVE
Trichomonas: NEGATIVE

## 2021-08-14 ENCOUNTER — Telehealth (HOSPITAL_COMMUNITY): Payer: Self-pay | Admitting: Emergency Medicine

## 2021-08-14 LAB — URINE CULTURE: Culture: >=100000 — AB

## 2021-08-14 MED ORDER — NITROFURANTOIN MONOHYD MACRO 100 MG PO CAPS: 100.0000 mg | ORAL_CAPSULE | Freq: Two times a day (BID) | ORAL | 0 refills | Status: DC

## 2021-08-14 MED ORDER — METRONIDAZOLE 500 MG PO TABS: 500.0000 mg | ORAL_TABLET | Freq: Two times a day (BID) | ORAL | 0 refills | Status: DC

## 2021-08-15 ENCOUNTER — Telehealth (HOSPITAL_COMMUNITY): Payer: Self-pay | Admitting: Emergency Medicine

## 2021-08-15 MED ORDER — METRONIDAZOLE 0.75 % VA GEL: 1.0000 | Freq: Every day | VAGINAL | 0 refills | Status: AC

## 2021-08-15 MED ORDER — FLUCONAZOLE 150 MG PO TABS: 150.0000 mg | ORAL_TABLET | Freq: Once | ORAL | 0 refills | Status: AC

## 2021-08-16 ENCOUNTER — Ambulatory Visit: Payer: Medicaid Other | Admitting: Family Medicine

## 2021-09-25 ENCOUNTER — Ambulatory Visit: Payer: Self-pay

## 2021-09-25 ENCOUNTER — Ambulatory Visit
Admission: RE | Admit: 2021-09-25 | Discharge: 2021-09-25 | Disposition: A | Discharge status: Home / Self Care | Payer: Medicaid Other | Source: Ambulatory Visit | Attending: Emergency Medicine | Admitting: Emergency Medicine

## 2021-09-25 VITALS — BP 101/69 | HR 101 | Temp 99.5°F | Resp 16

## 2021-09-25 DIAGNOSIS — N3090 Cystitis, unspecified without hematuria: Secondary | ICD-10-CM

## 2021-09-25 DIAGNOSIS — Z113 Encounter for screening for infections with a predominantly sexual mode of transmission: Secondary | ICD-10-CM | POA: Insufficient documentation

## 2021-09-25 DIAGNOSIS — Z1152 Encounter for screening for COVID-19: Secondary | ICD-10-CM | POA: Diagnosis not present

## 2021-09-25 DIAGNOSIS — N898 Other specified noninflammatory disorders of vagina: Secondary | ICD-10-CM

## 2021-09-25 LAB — POCT URINALYSIS DIP (MANUAL ENTRY)
Bilirubin, UA: NEGATIVE
Glucose, UA: NEGATIVE mg/dL
Ketones, POC UA: NEGATIVE mg/dL
Nitrite, UA: POSITIVE — AB
Protein Ur, POC: NEGATIVE mg/dL
Spec Grav, UA: 1.015 (ref 1.010–1.025)
Urobilinogen, UA: 0.2 E.U./dL
pH, UA: 5.5 (ref 5.0–8.0)

## 2021-09-25 LAB — POCT URINE PREGNANCY: Preg Test, Ur: NEGATIVE
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
## 2021-09-25 NOTE — Discharge Instructions (Addendum)
Common causes of urinary tract infections include but are not limited to holding your urine longer than you should, squatting instead of sitting down when urinating, sitting around in wet clothing such as a wet swimsuit or gym clothes too long, not emptying your bladder after having sexual intercourse, wiping from back to front instead of front to back after having a bowel movement.    Less common causes of urinary tract infections include but are not limited to anatomical shifts in the location of your bladder or uterus causing obstruction of passage of urine from your bladder to your urethra where your urine comes out or prolapse of your rectum into your vaginal wall.  These less common causes can be evaluated by gynecologist, a urologist or subspecialist called a uro-gynecologist   The urinalysis that we performed in the clinic today was abnormal.  Urine culture will be performed per our protocol.  The result of the urine culture will be available in the next 3 to 5 days and will be posted to your MyChart account.  If there is an abnormal finding, you will be contacted by phone and advised of further treatment recommendations, if any.   You were advised to begin antibiotics today because your urinalysis is abnormal and you are having active symptoms of an acute lower urinary tract infection also known as cystitis.  It is very important that you take all doses exactly as prescribed.  Incomplete antibiotic therapy can cause worsening urinary tract infection that can become aggressive, escape from urinary tract into your bloodstream causing sepsis which will require hospitalization.   Please pick up and begin taking your prescription for Macrobid (nitrofurantoin) as soon as possible.  Please take all doses exactly as prescribed.  You can take this medication with or without food.  This medication is safe to take with your other medications.   If you receive a phone call advising you that your urine culture  is negative but you begin to feel better after taking antibiotics for 24 hours, please feel free to complete the full course of antibiotics as they were likely needed and the urine culture result was false.  If your culture is negative and you do not feel any better, please return for repeat evaluation of other possible causes of your symptoms.   Please consider abstaining from sexual intercourse while you are being treated for urinary tract infection.   The results of your vaginal swab test which screens for BV, yeast, gonorrhea, chlamydia and trichomonas will be made available to you once it is complete.  This typically takes 3 to 5 days.  Please abstain from sexual intercourse of any kind, vaginal, oral or anal, until you have received the results of your STD testing.    You were tested for COVID-19 today.  This is a PCR test.  The result of your COVID-19 test will be posted to your MyChart once it is complete, typically this takes 24-36 hours.    If you have not had complete resolution of your symptoms after completing treatment as prescribed, please return to urgent care for repeat evaluation or follow-up with your primary care provider.   Thank you for visiting urgent care today.  I appreciate the opportunity to participate in your care.

## 2021-09-25 NOTE — ED Triage Notes (Signed)
Pt c/o vaginal odor and discharge. The patient states she was prescribed medication for UTI last month, and completed full course of medication.   The patient is needing covid testing for work, and she states she has not had any symptoms.   Started: last month   Home interventions: none

## 2021-09-26 ENCOUNTER — Telehealth: Payer: Self-pay | Admitting: Emergency Medicine

## 2021-09-26 LAB — SARS CORONAVIRUS 2 BY RT PCR: SARS Coronavirus 2 by RT PCR: NEGATIVE

## 2021-09-26 MED ORDER — NITROFURANTOIN MONOHYD MACRO 100 MG PO CAPS: 100.0000 mg | ORAL_CAPSULE | Freq: Two times a day (BID) | ORAL | 0 refills | Status: DC

## 2021-09-26 NOTE — Telephone Encounter (Signed)
Prescription sent

## 2021-09-27 LAB — URINE CULTURE

## 2021-09-27 LAB — CERVICOVAGINAL ANCILLARY ONLY
Bacterial Vaginitis (gardnerella): POSITIVE — AB
Candida Glabrata: NEGATIVE
Candida Vaginitis: NEGATIVE
Chlamydia: NEGATIVE
Comment: NEGATIVE
Comment: NEGATIVE
Comment: NEGATIVE
Comment: NEGATIVE
Comment: NEGATIVE
Comment: NORMAL
Neisseria Gonorrhea: NEGATIVE
Trichomonas: NEGATIVE

## 2021-09-30 ENCOUNTER — Telehealth (HOSPITAL_COMMUNITY): Payer: Self-pay | Admitting: Emergency Medicine

## 2021-09-30 MED ORDER — METRONIDAZOLE 500 MG PO TABS: 500.0000 mg | ORAL_TABLET | Freq: Two times a day (BID) | ORAL | 0 refills | Status: DC

## 2021-10-03 NOTE — Telephone Encounter (Signed)
Open in error

## 2021-12-08 ENCOUNTER — Ambulatory Visit
Admission: RE | Admit: 2021-12-08 | Discharge: 2021-12-08 | Disposition: A | Discharge status: Home / Self Care | Payer: Medicaid Other | Source: Ambulatory Visit | Attending: Urgent Care | Admitting: Urgent Care

## 2021-12-08 VITALS — BP 111/77 | HR 78 | Temp 98.8°F | Resp 20

## 2021-12-08 DIAGNOSIS — B9689 Other specified bacterial agents as the cause of diseases classified elsewhere: Secondary | ICD-10-CM | POA: Diagnosis not present

## 2021-12-08 DIAGNOSIS — N760 Acute vaginitis: Secondary | ICD-10-CM | POA: Diagnosis not present

## 2021-12-08 DIAGNOSIS — N898 Other specified noninflammatory disorders of vagina: Secondary | ICD-10-CM

## 2021-12-08 DIAGNOSIS — M545 Low back pain, unspecified: Secondary | ICD-10-CM

## 2021-12-08 LAB — POCT URINALYSIS DIP (MANUAL ENTRY)
Bilirubin, UA: NEGATIVE
Glucose, UA: NEGATIVE mg/dL
Ketones, POC UA: NEGATIVE mg/dL
Nitrite, UA: NEGATIVE
Protein Ur, POC: NEGATIVE mg/dL
Spec Grav, UA: 1.02 (ref 1.010–1.025)
Urobilinogen, UA: 0.2 E.U./dL
pH, UA: 7 (ref 5.0–8.0)

## 2021-12-08 LAB — POCT URINE PREGNANCY: Preg Test, Ur: NEGATIVE

## 2021-12-08 MED ORDER — FLUCONAZOLE 150 MG PO TABS: 150.0000 mg | ORAL_TABLET | ORAL | 0 refills | Status: DC

## 2021-12-08 MED ORDER — CLINDAMYCIN HCL 300 MG PO CAPS: 300.0000 mg | ORAL_CAPSULE | Freq: Two times a day (BID) | ORAL | 0 refills | Status: DC

## 2021-12-08 MED ORDER — TIZANIDINE HCL 4 MG PO TABS: 4.0000 mg | ORAL_TABLET | Freq: Every day | ORAL | 0 refills | Status: DC

## 2021-12-08 MED ORDER — NAPROXEN 375 MG PO TABS: 375.0000 mg | ORAL_TABLET | Freq: Two times a day (BID) | ORAL | 0 refills | Status: DC

## 2021-12-08 NOTE — Discharge Instructions (Addendum)
There was no sign of any urinary tract infection from the urine test.  Your pregnancy test was also negative.  We will let you know about the results from your vaginal swab tomorrow.  Please go ahead and start the medications I prescribed you today.

## 2021-12-08 NOTE — ED Triage Notes (Signed)
Pt c/o vaginal d/c, lower back pain and increase in gas x 3-4 days-NAD-steady gait

## 2021-12-08 NOTE — ED Provider Notes (Signed)
[facility]  Note:  This document was prepared using Conservation officer, historic buildings and may include unintentional dictation errors.  MRN: [id number] DOB: [birth year]  Subjective:   Rachel Lloyd is a 36 y.o. female presenting for 1 week history of persistent vaginal discharge now having more pelvic discomfort.  Has a history of BV infections.  Does not like to take oral metronidazole.  She has previously done better with MetroGel but feels like she is never really rid of the infection.  She is requesting future refills for this as she has gotten prescriptions in clinic with refills at a different facility.  She has also had 3 to 4-day history of low back pain.  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  This is what happened 3 to 4 days ago.  She has since had persistent low back pain and some stiffness.  Has previously used Advil but does not feel it is helping currently. No fall, trauma, numbness or tingling, saddle paresthesia, changes to bowel or urinary habits, radicular symptoms.  Patient did want to get a urinalysis done in the urine pregnancy test.  However, she did not want to wait for the results.  No current facility-administered medications for this encounter.  Current Outpatient Medications:    famotidine (PEPCID) 20 MG tablet, Take 1 tablet (20 mg total) by mouth daily., Disp: 30 tablet, Rfl: 1   metroNIDAZOLE (FLAGYL) 500 MG tablet, Take 1 tablet (500 mg total) by mouth 2 (two) times daily., Disp: 14 tablet, Rfl: 0   nitrofurantoin, macrocrystal-monohydrate, (MACROBID) 100 MG capsule, Take 1 capsule (100 mg total) by mouth 2 (two) times daily., Disp: 10 capsule, Rfl: 0   ondansetron (ZOFRAN) 4 MG tablet, Take 1 tablet (4 mg total) by mouth every 8 (eight) hours as needed for up to 60 doses for nausea or vomiting., Disp: 60 tablet, Rfl: 0   polyethylene glycol (MIRALAX) 17 g packet, Take 17 g by mouth daily., Disp: 14 each, Rfl: 0   Allergies   Allergen Reactions   Penicillins Anaphylaxis    Has taken cephalosprorins Has patient had a PCN reaction causing immediate rash, facial/tongue/throat swelling, SOB or lightheadedness with hypotension: Yes Has patient had a PCN reaction causing severe rash involving mucus membranes or skin necrosis: Yes Has patient had a PCN reaction that required hospitalization Yes Has patient had a PCN reaction occurring within the last 10 years: No If all of the above answers are "NO", then may proceed with Cephalosporin use.   Penicillin G Swelling   Latex Itching and Rash   Latex Rash    Past Medical History:  Diagnosis Date   Chlamydia    Hypertension    During second pregnancy   Mental disorder 2010   bipolar   Pneumonia    Trichimoniasis      Past Surgical History:  Procedure Laterality Date   INDUCED ABORTION     MOUTH SURGERY     as a child   WISDOM TOOTH EXTRACTION      Family History  Problem Relation Age of Onset   Hypertension Mother    Cancer Mother    Fibroids Mother    Healthy Father    ADD / ADHD Brother    Hypertension Maternal Grandmother    Anesthesia problems Neg Hx     Social History   Tobacco Use   Smoking status: Never   Smokeless tobacco: Never  Vaping Use   Vaping  Use: Never used  Substance Use Topics   Alcohol use: Yes    Comment: weekly   Drug use: Yes    Types: Marijuana    ROS   Objective:   Vitals: BP 111/77 (BP Location: Right Arm)   Pulse 78   Temp 98.8 F (37.1 C) (Oral)   Resp 20   LMP 11/12/2021   SpO2 98%   Physical Exam Constitutional:      General: She is not in acute distress.    Appearance: Normal appearance. She is well-developed. She is not ill-appearing, toxic-appearing or diaphoretic.  HENT:     Head: Normocephalic and atraumatic.     Nose: Nose normal.     Mouth/Throat:     Mouth: Mucous membranes are moist.     Pharynx: Oropharynx is clear.  Eyes:     General: No scleral icterus.       Right eye: No  discharge.        Left eye: No discharge.     Extraocular Movements: Extraocular movements intact.     Conjunctiva/sclera: Conjunctivae normal.  Cardiovascular:     Rate and Rhythm: Normal rate.  Pulmonary:     Effort: Pulmonary effort is normal.  Abdominal:     General: Bowel sounds are normal. There is no distension.     Palpations: Abdomen is soft. There is no mass.     Tenderness: There is abdominal tenderness in the suprapubic area. There is no right CVA tenderness, left CVA tenderness, guarding or rebound.  Musculoskeletal:     Lumbar back: Tenderness (paraspinal muscles on either side) present. No swelling, edema, deformity, signs of trauma, lacerations, spasms or bony tenderness. Normal range of motion. Negative right straight leg raise test and negative left straight leg raise test. No scoliosis.  Skin:    General: Skin is warm and dry.  Neurological:     General: No focal deficit present.     Mental Status: She is alert and oriented to person, place, and time.  Psychiatric:        Mood and Affect: Mood normal.        Behavior: Behavior normal.        Thought Content: Thought content normal.        Judgment: Judgment normal.     Results for orders placed or performed during the hospital encounter of 12/08/21 (from the past 24 hour(s))  POCT urinalysis dipstick     Status: Abnormal   Collection Time: 12/08/21  2:35 PM  Result Value Ref Range   Color, UA yellow yellow   Clarity, UA clear clear   Glucose, UA negative negative mg/dL   Bilirubin, UA negative negative   Ketones, POC UA negative negative mg/dL   Spec Grav, UA 3.818 2.993 - 1.025   Blood, UA small (A) negative   pH, UA 7.0 5.0 - 8.0   Protein Ur, POC negative negative mg/dL   Urobilinogen, UA 0.2 0.2 or 1.0 E.U./dL   Nitrite, UA Negative Negative   Leukocytes, UA Trace (A) Negative  POCT urine pregnancy     Status: None   Collection Time: 12/08/21  2:37 PM  Result Value Ref Range   Preg Test, Ur  Negative Negative    Assessment and Plan :   PDMP not reviewed this encounter.  1. Bacterial vaginosis   2. Vaginal discharge   3. Acute bilateral low back pain without sciatica     Patient did not want to wait for the urinalysis and  urine pregnancy test but she did want to run.  She wanted me to let her know her results through MyChart.  I will include this as a part of her updated patient instructions, AVS.  We will treat patient empirically for bacterial vaginosis with clindamycin and for yeast vaginitis with fluconazole.  Labs pending.  No signs of an acute spinal emergency.  Recommended conservative management for back strain, her low back pain with naproxen and tizanidine.  Will defer urine culture as there were no urinary symptoms.  Counseled patient on potential for adverse effects with medications prescribed/recommended today, ER and return-to-clinic precautions discussed, patient verbalized understanding.    Wallis Bamberg, PA-C 12/08/21 1507

## 2021-12-09 LAB — CERVICOVAGINAL ANCILLARY ONLY
Bacterial Vaginitis (gardnerella): POSITIVE — AB
Candida Glabrata: NEGATIVE
Candida Vaginitis: NEGATIVE
Chlamydia: NEGATIVE
Comment: NEGATIVE
Comment: NEGATIVE
Comment: NEGATIVE
Comment: NEGATIVE
Comment: NEGATIVE
Comment: NORMAL
Neisseria Gonorrhea: NEGATIVE
Trichomonas: POSITIVE — AB

## 2021-12-10 ENCOUNTER — Telehealth (HOSPITAL_COMMUNITY): Payer: Self-pay | Admitting: Emergency Medicine

## 2021-12-10 MED ORDER — METRONIDAZOLE 500 MG PO TABS: 500.0000 mg | ORAL_TABLET | Freq: Two times a day (BID) | ORAL | 0 refills | Status: DC

## 2021-12-23 ENCOUNTER — Ambulatory Visit
Admission: EM | Admit: 2021-12-23 | Discharge: 2021-12-23 | Disposition: A | Discharge status: Home / Self Care | Payer: Medicaid Other | Attending: Emergency Medicine | Admitting: Emergency Medicine

## 2021-12-23 ENCOUNTER — Telehealth: Payer: Self-pay

## 2021-12-23 DIAGNOSIS — Z113 Encounter for screening for infections with a predominantly sexual mode of transmission: Secondary | ICD-10-CM

## 2021-12-23 DIAGNOSIS — N3001 Acute cystitis with hematuria: Secondary | ICD-10-CM | POA: Diagnosis not present

## 2021-12-23 LAB — POCT URINALYSIS DIP (MANUAL ENTRY)
Glucose, UA: NEGATIVE mg/dL
Leukocytes, UA: NEGATIVE
Nitrite, UA: POSITIVE — AB
Protein Ur, POC: >=300 mg/dL — AB
Spec Grav, UA: 1.025 (ref 1.010–1.025)
Urobilinogen, UA: 1 E.U./dL
pH, UA: 5 (ref 5.0–8.0)

## 2021-12-23 MED ORDER — FLUCONAZOLE 150 MG PO TABS: ORAL_TABLET | ORAL | 0 refills | Status: DC

## 2021-12-23 MED ORDER — SULFAMETHOXAZOLE-TRIMETHOPRIM 800-160 MG PO TABS: 2.0000 | ORAL_TABLET | Freq: Two times a day (BID) | ORAL | 0 refills | Status: AC

## 2021-12-23 NOTE — Discharge Instructions (Signed)
Common causes of urinary tract infections include but are not limited to holding your urine longer than you should, squatting instead of sitting down when urinating, sitting around in wet clothing such as a wet swimsuit or gym clothes too long, not emptying your bladder after having sexual intercourse, wiping from back to front instead of front to back after having a bowel movement.     The urinalysis that we performed in the clinic today was abnormal.  Urine culture will be performed per our protocol.  The result of the urine culture will be available in the next 3 to 5 days and will be posted to your MyChart account.  If there is an abnormal finding, you will be contacted by phone and advised of further treatment recommendations, if any.   You were advised to begin antibiotics today because your urinalysis is abnormal and you are having active symptoms of an acute lower urinary tract infection also known as cystitis.     Please pick up and begin taking your prescription for Bactrim DS (trimethoprim sulfamethoxazole) as soon as possible.  Please take all doses exactly as prescribed.  You can take this medication with or without food.  This medication is safe to take with your other medications.  Unfortunately, because you have a severe allergy to penicillin we were unable to provide you with an injection of antibiotics during your visit today.   If you receive a phone call advising you that your urine culture is negative but you begin to feel better after taking antibiotics for 24 hours, please feel free to complete the full course of antibiotics as they were likely needed and the urine culture result was false.  If your culture is negative and you do not feel any better, please return for repeat evaluation of other possible causes of your symptoms.   Because we know that antibiotic treatment can often cause vaginal yeast infections, I have also provided you with a prescription for fluconazole  (Diflucan).  Please take the first tablet the day you begin to experience symptoms of vaginal yeast infection which include vaginal itching and thick white vaginal discharge.  Please take the second tablet three days after the first tablet to ensure resolution of the yeast infection.  The results of your vaginal swab test which screens for BV, yeast, gonorrhea, chlamydia and trichomonas will be made posted to your MyChart account once it is complete.  This typically takes 2 to 4 days.  Please abstain from sexual intercourse of any kind, vaginal, oral or anal, until you have received the results of your STD testing.     If any of your results are abnormal, you will receive a phone call regarding treatment.         If you have not had complete resolution of your symptoms after completing treatment as prescribed, please return to urgent care for repeat evaluation or follow-up with your primary care provider.  Repeat urinalysis and urine culture may be indicated for more directed therapy.   Thank you for visiting urgent care today.  I appreciate the opportunity to participate in your care.

## 2021-12-23 NOTE — ED Triage Notes (Signed)
Pt report right lower quadrant abdominal pain, radiates to legs x 3 days. Pt taking metronidazole today will be the last dose.

## 2021-12-23 NOTE — Telephone Encounter (Signed)
Patient calls nurse line complaining of severe abdominal cramping.   She reports she was diagnosed with BV and Trichomonas on 11/26 at urgent care. She reports she is almost done with the medication. However, she reports for the last 3 days her period has been "very heavy" and she has been experiencing "painful cramps."   Apt offered for tomorrow, however patient declined stating she was going to go to UC.
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
## 2021-12-31 ENCOUNTER — Ambulatory Visit (INDEPENDENT_AMBULATORY_CARE_PROVIDER_SITE_OTHER): Payer: Medicaid Other

## 2021-12-31 DIAGNOSIS — Z111 Encounter for screening for respiratory tuberculosis: Secondary | ICD-10-CM

## 2022-01-02 ENCOUNTER — Ambulatory Visit: Payer: Medicaid Other

## 2022-01-02 DIAGNOSIS — Z111 Encounter for screening for respiratory tuberculosis: Secondary | ICD-10-CM

## 2022-01-02 LAB — TB SKIN TEST
Induration: 0 mm
TB Skin Test: NEGATIVE

## 2022-01-02 NOTE — Progress Notes (Signed)
Patient is here for a PPD placement.  PPD placed in left forearm @ 3:15 pm.  Patient will return 01/02/2022 to have PPD read. Veronda Prude, RN

## 2022-01-02 NOTE — Progress Notes (Signed)
PPD Reading Note  PPD read and results entered in EpicCare.  Result: 0 mm induration.  Interpretation: Negative  Allergic reaction: no

## 2022-01-04 ENCOUNTER — Ambulatory Visit
Admission: EM | Admit: 2022-01-04 | Discharge: 2022-01-04 | Disposition: A | Discharge status: Home / Self Care | Payer: Medicaid Other | Attending: Urgent Care | Admitting: Urgent Care

## 2022-01-04 DIAGNOSIS — B349 Viral infection, unspecified: Secondary | ICD-10-CM

## 2022-01-04 DIAGNOSIS — Z20828 Contact with and (suspected) exposure to other viral communicable diseases: Secondary | ICD-10-CM

## 2022-01-04 MED ORDER — OSELTAMIVIR PHOSPHATE 75 MG PO CAPS: 75.0000 mg | ORAL_CAPSULE | Freq: Two times a day (BID) | ORAL | 0 refills | Status: DC

## 2022-01-04 MED ORDER — PROMETHAZINE-DM 6.25-15 MG/5ML PO SYRP: 5.0000 mL | ORAL_SOLUTION | Freq: Three times a day (TID) | ORAL | 0 refills | Status: DC | PRN

## 2022-01-04 MED ORDER — PSEUDOEPHEDRINE HCL 60 MG PO TABS: 60.0000 mg | ORAL_TABLET | Freq: Three times a day (TID) | ORAL | 0 refills | Status: DC | PRN

## 2022-01-04 MED ORDER — CETIRIZINE HCL 10 MG PO TABS: 10.0000 mg | ORAL_TABLET | Freq: Every day | ORAL | 0 refills | Status: DC

## 2022-01-04 NOTE — Discharge Instructions (Addendum)
We will notify you of your test results as they arrive and may take between about 24 hours.  I encourage you to sign up for MyChart if you have not already done so as this can be the easiest way for [place] to communicate results to you online or through a phone app.  Generally, we only contact you if it is a positive test result.  In the meantime, if you develop worsening symptoms including fever, chest pain, shortness of breath despite our current treatment plan then please report to the emergency room as this may be a sign of worsening status from possible viral infection.  Otherwise, we will manage this as a viral syndrome like influenza given your exposure, so start Tamiflu. For sore throat or cough try using a honey-based tea. Use 3 teaspoons of honey with juice squeezed from half lemon. Place shaved pieces of ginger into 1/2-1 cup of water and warm over stove top. Then mix the ingredients and repeat every 4 hours as needed. Please take Tylenol 500mg -650mg  every 6 hours for aches and pains, fevers. Hydrate very well with at least 2 liters of water. Eat light meals such as soups to replenish electrolytes and soft fruits, veggies. Start an antihistamine like Zyrtec for postnasal drainage, sinus congestion.  You can take this together with pseudoephedrine (Sudafed) at a dose of 60 mg 2-3 times a day as needed for the same kind of congestion.  Use the cough medications as needed.

## 2022-01-04 NOTE — ED Triage Notes (Addendum)
Pt c/o body aches, cough, nasal congestion sx started last night-reports +flu exposure-NAD-steady gait-pt added she is currently taking abx for UTI

## 2022-01-04 NOTE — ED Provider Notes (Signed)
[facility]  Note:  This document was prepared using Conservation officer, historic buildings and may include unintentional dictation errors.  MRN: [id number] DOB: August 10, 1985  Subjective:   Rachel Lloyd is a 36 y.o. female presenting for 1 day history of acute onset body pains, malaise, fatigue, coughing, sinus congestion. Had negative COVID test through work, does not want a repeat.  Had very close exposure to flu.  No current facility-administered medications for this encounter.  Current Outpatient Medications:    fluconazole (DIFLUCAN) 150 MG tablet, Take 1 tablet on day 4 of antibiotics.  Take second tablet 3 days later., Disp: 2 tablet, Rfl: 0   naproxen (NAPROSYN) 375 MG tablet, Take 1 tablet (375 mg total) by mouth 2 (two) times daily with a meal., Disp: 30 tablet, Rfl: 0   tiZANidine (ZANAFLEX) 4 MG tablet, Take 1 tablet (4 mg total) by mouth at bedtime., Disp: 30 tablet, Rfl: 0   Allergies  Allergen Reactions   Penicillins Anaphylaxis    Has taken cephalosprorins Has patient had a PCN reaction causing immediate rash, facial/tongue/throat swelling, SOB or lightheadedness with hypotension: Yes Has patient had a PCN reaction causing severe rash involving mucus membranes or skin necrosis: Yes Has patient had a PCN reaction that required hospitalization Yes Has patient had a PCN reaction occurring within the last 10 years: No If all of the above answers are "NO", then may proceed with Cephalosporin use.   Penicillin G Swelling   Latex Itching and Rash   Latex Rash    Past Medical History:  Diagnosis Date   Chlamydia    Hypertension    During second pregnancy   Mental disorder 2010   bipolar   Pneumonia    Trichimoniasis      Past Surgical History:  Procedure Laterality Date   INDUCED ABORTION     MOUTH SURGERY     as a child   WISDOM TOOTH EXTRACTION      Family History  Problem Relation Age of Onset   Hypertension Mother    Cancer Mother     Fibroids Mother    Healthy Father    ADD / ADHD Brother    Hypertension Maternal Grandmother    Anesthesia problems Neg Hx     Social History   Tobacco Use   Smoking status: Never   Smokeless tobacco: Never  Vaping Use   Vaping Use: Never used  Substance Use Topics   Alcohol use: Yes    Comment: weekly   Drug use: Yes    Types: Marijuana    ROS   Objective:   Vitals: BP 122/73 (BP Location: Left Arm)   Pulse 92   Temp 98.6 F (37 C) (Oral)   Resp 18   LMP 12/21/2021 (Approximate)   SpO2 98%   Physical Exam Constitutional:      General: She is not in acute distress.    Appearance: Normal appearance. She is well-developed. She is not ill-appearing, toxic-appearing or diaphoretic.  HENT:     Head: Normocephalic and atraumatic.     Nose: Congestion present. No rhinorrhea.     Mouth/Throat:     Mouth: Mucous membranes are moist.  Eyes:     General: No scleral icterus.       Right eye: No discharge.        Left eye: No discharge.     Extraocular Movements: Extraocular movements intact.  Cardiovascular:     Rate and Rhythm: Normal rate and  regular rhythm.     Heart sounds: Normal heart sounds. No murmur heard.    No friction rub. No gallop.  Pulmonary:     Effort: Pulmonary effort is normal. No respiratory distress.     Breath sounds: No stridor. No wheezing, rhonchi or rales.  Chest:     Chest wall: No tenderness.  Skin:    General: Skin is warm and dry.  Neurological:     General: No focal deficit present.     Mental Status: She is alert and oriented to person, place, and time.  Psychiatric:        Mood and Affect: Mood normal.        Behavior: Behavior normal.     Assessment and Plan :   PDMP not reviewed this encounter.  1. Acute viral syndrome   2. Exposure to influenza     Patient declined COVID test.  Deferred imaging given clear cardiopulmonary exam, hemodynamically stable vital signs. Will cover for influenza with Tamiflu given  exposure, symptom set, current incidence in the community.  Use supportive care, rest, fluids, hydration, light meals, schedule Tylenol and ibuprofen. Counseled patient on potential for adverse effects with medications prescribed today, patient verbalized understanding. ER and return-to-clinic precautions discussed, patient verbalized understanding.    Wallis Bamberg, [place] 01/05/22 [phone]

## 2022-02-12 ENCOUNTER — Ambulatory Visit
Admission: RE | Admit: 2022-02-12 | Discharge: 2022-02-12 | Disposition: A | Discharge status: Home / Self Care | Payer: Medicaid Other | Source: Ambulatory Visit | Attending: Nurse Practitioner | Admitting: Nurse Practitioner

## 2022-02-12 VITALS — BP 119/76 | HR 94 | Temp 98.3°F | Resp 16

## 2022-02-12 DIAGNOSIS — N760 Acute vaginitis: Secondary | ICD-10-CM | POA: Diagnosis not present

## 2022-02-12 DIAGNOSIS — R21 Rash and other nonspecific skin eruption: Secondary | ICD-10-CM | POA: Diagnosis not present

## 2022-02-12 MED ORDER — METRONIDAZOLE 500 MG PO TABS: 500.0000 mg | ORAL_TABLET | Freq: Two times a day (BID) | ORAL | 0 refills | Status: DC

## 2022-02-12 MED ORDER — METRONIDAZOLE 0.75 % VA GEL: 1.0000 | Freq: Every day | VAGINAL | 0 refills | Status: AC

## 2022-02-12 MED ORDER — CLOTRIMAZOLE-BETAMETHASONE 1-0.05 % EX CREA: TOPICAL_CREAM | CUTANEOUS | 1 refills | Status: DC
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
## 2022-02-12 NOTE — Discharge Instructions (Signed)
Start Flagyl twice a day for 7 days.  Please do not drink alcohol while on this medication as this can make you sick The clinic will contact you with results of the vaginal testing done today is positive Topical steroid/antifungal cream to your rash on your right lower leg twice a day as needed.  If this does not improve please follow-up with your PCP or dermatology Please go to the emergency room if you have any worsening symptoms

## 2022-02-13 LAB — CERVICOVAGINAL ANCILLARY ONLY
Bacterial Vaginitis (gardnerella): POSITIVE — AB
Candida Glabrata: NEGATIVE
Candida Vaginitis: NEGATIVE
Chlamydia: NEGATIVE
Comment: NEGATIVE
Comment: NEGATIVE
Comment: NEGATIVE
Comment: NEGATIVE
Comment: NEGATIVE
Comment: NORMAL
Neisseria Gonorrhea: NEGATIVE
Trichomonas: NEGATIVE

## 2022-03-21 ENCOUNTER — Encounter (HOSPITAL_COMMUNITY): Payer: Self-pay | Admitting: *Deleted

## 2022-03-21 ENCOUNTER — Inpatient Hospital Stay (HOSPITAL_COMMUNITY)
Admission: AD | Admit: 2022-03-21 | Discharge: 2022-03-21 | Disposition: A | Discharge status: Home / Self Care | Payer: Medicaid Other | Attending: Obstetrics and Gynecology | Admitting: Obstetrics and Gynecology

## 2022-03-21 ENCOUNTER — Inpatient Hospital Stay (HOSPITAL_COMMUNITY): Payer: Medicaid Other

## 2022-03-21 ENCOUNTER — Other Ambulatory Visit: Payer: Self-pay

## 2022-03-21 DIAGNOSIS — Z3491 Encounter for supervision of normal pregnancy, unspecified, first trimester: Secondary | ICD-10-CM

## 2022-03-21 DIAGNOSIS — O09521 Supervision of elderly multigravida, first trimester: Secondary | ICD-10-CM | POA: Insufficient documentation

## 2022-03-21 DIAGNOSIS — Z3A01 Less than 8 weeks gestation of pregnancy: Secondary | ICD-10-CM | POA: Insufficient documentation

## 2022-03-21 DIAGNOSIS — R1030 Lower abdominal pain, unspecified: Secondary | ICD-10-CM | POA: Diagnosis not present

## 2022-03-21 DIAGNOSIS — O26891 Other specified pregnancy related conditions, first trimester: Secondary | ICD-10-CM | POA: Insufficient documentation

## 2022-03-21 DIAGNOSIS — O208 Other hemorrhage in early pregnancy: Secondary | ICD-10-CM | POA: Diagnosis not present

## 2022-03-21 LAB — URINALYSIS, ROUTINE W REFLEX MICROSCOPIC
Bilirubin Urine: NEGATIVE
Glucose, UA: NEGATIVE mg/dL
Ketones, ur: 20 mg/dL — AB
Leukocytes,Ua: NEGATIVE
Nitrite: NEGATIVE
Protein, ur: NEGATIVE mg/dL
Specific Gravity, Urine: 1.026 (ref 1.005–1.030)
pH: 5 (ref 5.0–8.0)

## 2022-03-21 LAB — CBC
HCT: 38.7 % (ref 36.0–46.0)
Hemoglobin: 13.3 g/dL (ref 12.0–15.0)
MCH: 31 pg (ref 26.0–34.0)
MCHC: 34.4 g/dL (ref 30.0–36.0)
MCV: 90.2 fL (ref 80.0–100.0)
Platelets: 257 10*3/uL (ref 150–400)
RBC: 4.29 MIL/uL (ref 3.87–5.11)
RDW: 12.5 % (ref 11.5–15.5)
WBC: 7.8 10*3/uL (ref 4.0–10.5)
nRBC: 0 % (ref 0.0–0.2)

## 2022-03-21 LAB — WET PREP, GENITAL
Clue Cells Wet Prep HPF POC: NONE SEEN
Sperm: NONE SEEN
Trich, Wet Prep: NONE SEEN
WBC, Wet Prep HPF POC: <10 (ref <10)
Yeast Wet Prep HPF POC: NONE SEEN

## 2022-03-21 LAB — POCT PREGNANCY, URINE: Preg Test, Ur: POSITIVE — AB

## 2022-03-21 LAB — HCG, QUANTITATIVE, PREGNANCY: hCG, Beta Chain, Quant, S: 35393 m[IU]/mL — ABNORMAL HIGH (ref <5)

## 2022-03-21 NOTE — MAU Note (Addendum)
Rachel Lloyd is a 37 y.o. at Unknown here in MAU reporting: she's having abdominal pain in the lower right quadrant and behind the umbilicus.  Reports pain behind umbilicus is a constant ache and RLQ pain is cramping. Denies VB. Seen on February 14, 2022 @ Urgent Care, states negative UPT. LMP: 12/21/2021 Onset of complaint: 5 days ago Pain score: 8 Vitals:   03/21/22 0844  BP: 112/70  Pulse: 75  Resp: 18  Temp: 98.5 F (36.9 C)  SpO2: 100%     FHT: Attempted, None heard Lab orders placed from triage:   UPT

## 2022-03-21 NOTE — MAU Provider Note (Signed)
History     CSN: [id number]  Arrival date and time: 03/21/22 [id number]   Event Date/Time   First Provider Initiated Contact with Patient 03/21/22 [phone]      Chief Complaint  Patient presents with   Vaginal Bleeding   HPI  Rachel Lloyd is a 37 y.o. [id number] at 37w5dwho presents for evaluation of lower abdominal pain. Patient reports she has pain in her lower abdomen and upper abdomen. She reports it comes and goes and is sometimes sharp. Patient rates the pain as a 5/10 and has not tried anything for the pain. She is worried that it is an ectopic pregnancy.  She denies any vaginal bleeding, discharge, and leaking of fluid. Denies any constipation or any urinary complaints. She reports she and her son have been having loose stools this week.    OB History     Gravida  11   Para  4   Term  4   Preterm  0   AB  5   Living  4      SAB  1   IAB  4   Ectopic  0   Multiple      Live Births  4           Past Medical History:  Diagnosis Date   Chlamydia    Hypertension    During second pregnancy   Mental disorder 2010   bipolar   Pneumonia    Trichimoniasis     Past Surgical History:  Procedure Laterality Date   INDUCED ABORTION     MOUTH SURGERY     as a child   WISDOM TOOTH EXTRACTION      Family History  Problem Relation Age of Onset   Hypertension Mother    Cancer Mother    Fibroids Mother    Healthy Father    ADD / ADHD Brother    Hypertension Maternal Grandmother    Anesthesia problems Neg Hx     Social History   Tobacco Use   Smoking status: Never   Smokeless tobacco: Never  Vaping Use   Vaping Use: Never used  Substance Use Topics   Alcohol use: Yes    Comment: weekly   Drug use: Yes    Types: Marijuana    Allergies:  Allergies  Allergen Reactions   Penicillins Anaphylaxis    Has taken cephalosprorins Has patient had a PCN reaction causing immediate rash, facial/tongue/throat swelling, SOB or lightheadedness with  hypotension: Yes Has patient had a PCN reaction causing severe rash involving mucus membranes or skin necrosis: Yes Has patient had a PCN reaction that required hospitalization Yes Has patient had a PCN reaction occurring within the last 10 years: No If all of the above answers are "NO", then may proceed with Cephalosporin use.   Penicillin G Swelling   Latex Itching and Rash   Latex Rash    No medications prior to admission.    Review of Systems  Constitutional: Negative.  Negative for fatigue and fever.  HENT: Negative.    Respiratory: Negative.  Negative for shortness of breath.   Cardiovascular: Negative.  Negative for chest pain.  Gastrointestinal:  Positive for abdominal pain. Negative for constipation, diarrhea, nausea and vomiting.  Genitourinary: Negative.  Negative for dysuria, vaginal bleeding and vaginal discharge.  Neurological: Negative.  Negative for dizziness and headaches.   Physical Exam   Blood pressure 112/70, pulse 75, temperature 98.5 F (36.9 C), temperature source Oral, resp. rate 18, weight  63.1 kg, last menstrual period 12/21/2021, SpO2 100 %.  Patient Vitals for the past 24 hrs:  BP Temp Temp src Pulse Resp SpO2 Weight  03/21/22 0844 112/70 98.5 F (36.9 C) Oral 75 18 100 % --  03/21/22 0837 -- -- -- -- -- -- 63.1 kg    Physical Exam Vitals and nursing note reviewed.  Constitutional:      General: She is not in acute distress.    Appearance: She is well-developed.  HENT:     Head: Normocephalic.  Eyes:     Pupils: Pupils are equal, round, and reactive to light.  Cardiovascular:     Rate and Rhythm: Normal rate and regular rhythm.     Heart sounds: Normal heart sounds.  Pulmonary:     Effort: Pulmonary effort is normal. No respiratory distress.     Breath sounds: Normal breath sounds.  Abdominal:     General: Bowel sounds are normal. There is no distension.     Palpations: Abdomen is soft.     Tenderness: There is no abdominal tenderness.   Skin:    General: Skin is warm and dry.  Neurological:     Mental Status: She is alert and oriented to person, place, and time.  Psychiatric:        Mood and Affect: Mood normal.        Behavior: Behavior normal.        Thought Content: Thought content normal.        Judgment: Judgment normal.    MAU Course  Procedures  Results for orders placed or performed during the hospital encounter of 03/21/22 (from the past 24 hour(s))  Pregnancy, urine POC     Status: Abnormal   Collection Time: 03/21/22  8:32 AM  Result Value Ref Range   Preg Test, Ur POSITIVE (A) NEGATIVE  Urinalysis, Routine w reflex microscopic -Urine, Clean Catch     Status: Abnormal   Collection Time: 03/21/22  8:35 AM  Result Value Ref Range   Color, Urine YELLOW YELLOW   APPearance HAZY (A) CLEAR   Specific Gravity, Urine 1.026 1.005 - 1.030   pH 5.0 5.0 - 8.0   Glucose, UA NEGATIVE NEGATIVE mg/dL   Hgb urine dipstick MODERATE (A) NEGATIVE   Bilirubin Urine NEGATIVE NEGATIVE   Ketones, ur 20 (A) NEGATIVE mg/dL   Protein, ur NEGATIVE NEGATIVE mg/dL   Nitrite NEGATIVE NEGATIVE   Leukocytes,Ua NEGATIVE NEGATIVE   RBC / HPF 0-5 0 - 5 RBC/hpf   WBC, UA 0-5 0 - 5 WBC/hpf   Bacteria, UA RARE (A) NONE SEEN   Squamous Epithelial / HPF 11-20 0 - 5 /HPF   Mucus PRESENT   Wet prep, genital     Status: None   Collection Time: 03/21/22  9:04 AM  Result Value Ref Range   Yeast Wet Prep HPF POC NONE SEEN NONE SEEN   Trich, Wet Prep NONE SEEN NONE SEEN   Clue Cells Wet Prep HPF POC NONE SEEN NONE SEEN   WBC, Wet Prep HPF POC <10 <10   Sperm NONE SEEN   CBC     Status: None   Collection Time: 03/21/22  9:21 AM  Result Value Ref Range   WBC 7.8 4.0 - 10.5 K/uL   RBC 4.29 3.87 - 5.11 MIL/uL   Hemoglobin 13.3 12.0 - 15.0 g/dL   HCT 38.7 36.0 - 46.0 %   MCV 90.2 80.0 - 100.0 fL   MCH 31.0 26.0 - 34.0 pg  MCHC 34.4 30.0 - 36.0 g/dL   RDW 12.5 11.5 - 15.5 %   Platelets 257 150 - 400 K/uL   nRBC 0.0 0.0 - 0.2 %   hCG, quantitative, pregnancy     Status: Abnormal   Collection Time: 03/21/22  9:21 AM  Result Value Ref Range   hCG, Beta Chain, Quant, S 35,393 (H) <5 mIU/mL     US OB LESS THAN 14 WEEKS WITH OB TRANSVAGINAL  Result Date: 03/21/2022 CLINICAL DATA:  Abdominal pain. Estimated gestational age of [redacted] weeks, 6 days by LMP. EXAM: OBSTETRIC <14 WK [place] AND TRANSVAGINAL OB US TECHNIQUE: Both transabdominal and transvaginal ultrasound examinations were performed for complete evaluation of the gestation as well as the maternal uterus, adnexal regions, and pelvic cul-de-sac. Transvaginal technique was performed to assess early pregnancy. COMPARISON:  None Available. FINDINGS: Intrauterine gestational sac: Single. Yolk sac:  Visualized. Embryo:  Not Visualized. MSD: 9.7 mm   5 w   5 d Subchorionic hemorrhage:  Tiny 4 x 3 mm subchorionic hemorrhage. Maternal uterus/adnexae: Unremarkable. Right ovarian corpus luteum noted. No free fluid. IMPRESSION: 1. Early intrauterine gestational sac with yolk sac, but no fetal pole or cardiac activity visualized. Recommend follow-up quantitative beta HCG levels and follow-up US in 10-14 days for definitive diagnosis. This recommendation follows SRU consensus guidelines: Diagnostic Criteria for Nonviable Pregnancy Early in the First Trimester. [place] Med [id number]. 2. Tiny subchorionic hemorrhage. Electronically Signed   By: Titus Dubin M.D.   On: 03/21/2022 10:30     MDM Labs ordered and reviewed.   UA, UPT CBC, HCG ABO/Rh-  Wet prep and gc/chlamydia US OB Comp Less 14 weeks with Transvaginal  CNM independently reviewed the imaging ordered. Imaging show IUP with yolk sac  CNM discussed recommendation for repeat u/s in 10-14 days. Patient states she will follow up with OB  Assessment and Plan   1. Normal intrauterine pregnancy on prenatal ultrasound in first trimester   2. [redacted] weeks gestation of pregnancy     -Discharge home in stable condition -First  trimester precautions discussed -Patient advised to follow-up with OB as scheduled for prenatal care -Patient may return to MAU as needed or if her condition were to change or worsen  Wende Mott, CNM 03/21/2022, 8:59 AM

## 2022-03-21 NOTE — Discharge Instructions (Signed)

## 2022-03-24 LAB — GC/CHLAMYDIA PROBE AMP (~~LOC~~) NOT AT ARMC
Chlamydia: POSITIVE — AB
Comment: NEGATIVE
Comment: NORMAL
Neisseria Gonorrhea: NEGATIVE

## 2022-03-26 ENCOUNTER — Telehealth (HOSPITAL_COMMUNITY): Payer: Self-pay

## 2022-03-27 ENCOUNTER — Inpatient Hospital Stay (HOSPITAL_COMMUNITY)
Admission: AD | Admit: 2022-03-27 | Discharge: 2022-03-27 | Disposition: A | Discharge status: Home / Self Care | Payer: Medicaid Other | Attending: Obstetrics and Gynecology | Admitting: Obstetrics and Gynecology

## 2022-03-27 ENCOUNTER — Other Ambulatory Visit: Payer: Self-pay

## 2022-03-27 ENCOUNTER — Telehealth (HOSPITAL_COMMUNITY): Payer: Self-pay

## 2022-03-27 ENCOUNTER — Telehealth: Payer: Self-pay

## 2022-03-27 DIAGNOSIS — Z3A01 Less than 8 weeks gestation of pregnancy: Secondary | ICD-10-CM | POA: Insufficient documentation

## 2022-03-27 DIAGNOSIS — A568 Sexually transmitted chlamydial infection of other sites: Secondary | ICD-10-CM | POA: Insufficient documentation

## 2022-03-27 DIAGNOSIS — O98819 Other maternal infectious and parasitic diseases complicating pregnancy, unspecified trimester: Secondary | ICD-10-CM | POA: Diagnosis not present

## 2022-03-27 DIAGNOSIS — O98311 Other infections with a predominantly sexual mode of transmission complicating pregnancy, first trimester: Secondary | ICD-10-CM | POA: Insufficient documentation

## 2022-03-27 DIAGNOSIS — A749 Chlamydial infection, unspecified: Secondary | ICD-10-CM | POA: Diagnosis not present

## 2022-03-27 DIAGNOSIS — O09521 Supervision of elderly multigravida, first trimester: Secondary | ICD-10-CM | POA: Insufficient documentation

## 2022-03-27 DIAGNOSIS — R141 Gas pain: Secondary | ICD-10-CM

## 2022-03-27 DIAGNOSIS — R1030 Lower abdominal pain, unspecified: Secondary | ICD-10-CM | POA: Diagnosis present

## 2022-03-27 DIAGNOSIS — O26891 Other specified pregnancy related conditions, first trimester: Secondary | ICD-10-CM | POA: Insufficient documentation

## 2022-03-27 MED ORDER — ONDANSETRON 4 MG PO TBDP
4.0000 mg | ORAL_TABLET | Freq: Once | ORAL | Status: AC
  Administered 2022-03-27: 4 mg via ORAL
  Filled 2022-03-27: qty 1

## 2022-03-27 MED ORDER — ACETAMINOPHEN 325 MG PO TABS
650.0000 mg | ORAL_TABLET | Freq: Once | ORAL | Status: AC
  Administered 2022-03-27: 650 mg via ORAL
  Filled 2022-03-27: qty 2

## 2022-03-27 MED ORDER — SIMETHICONE 80 MG PO CHEW: 80.0000 mg | CHEWABLE_TABLET | Freq: Four times a day (QID) | ORAL | 0 refills | Status: DC | PRN

## 2022-03-27 MED ORDER — PROMETHAZINE HCL 12.5 MG PO TABS: 12.5000 mg | ORAL_TABLET | Freq: Four times a day (QID) | ORAL | 0 refills | Status: DC | PRN

## 2022-03-27 MED ORDER — AZITHROMYCIN 250 MG PO TABS
1000.0000 mg | ORAL_TABLET | Freq: Once | ORAL | Status: AC
  Administered 2022-03-27: 1000 mg via ORAL
  Filled 2022-03-27: qty 4

## 2022-03-27 NOTE — Telephone Encounter (Signed)
Patient calls nurse line in regards to positive Chlamydia.   She reports she went to MAU last week and saw positive result in mychart. She reports she attempted to reach out to them, however they stated to contact her PCP for treatment.  Of note, patient is pregnant. She reports she "just found this out in [place] as well." Patient scheduled for Monday to discuss.  Patient is requesting treatment in the meantime.   Precautions discussed with patient.   Will forward to PCP.

## 2022-03-27 NOTE — MAU Provider Note (Signed)
Event Date/Time  First Provider Initiated Contact with Patient 03/27/22 1730     S Ms. Rachel Lloyd is a 37 y.o. [id number] patient who presents to MAU today requesting treatment for Chlamydia. She states she saw a positive result in her MyChart from her MAU visit 03/21/2022.   Patient c/o upper abdominal and periumbilical pain, unchanged since 03/21/2022. Pain score is 10/10. She has not taken medication or tried other treatments for this complaint.   O BP 110/68 (BP Location: Right Arm)   Pulse 86   Temp 98.4 F (36.9 C) (Oral)   Resp 20   Ht '5\' 2"'$  (1.575 m)   Wt 63.3 kg   LMP 12/21/2021 (Approximate)   SpO2 100%   BMI 25.51 kg/m   Physical Exam Vitals and nursing note reviewed.  Constitutional:      Appearance: She is well-developed. She is not ill-appearing.  Cardiovascular:     Rate and Rhythm: Normal rate.  Pulmonary:     Effort: Pulmonary effort is normal.  Abdominal:     Tenderness: There is abdominal tenderness in the epigastric area.  Neurological:     Mental Status: She is alert and oriented to person, place, and time.  Psychiatric:        Mood and Affect: Mood normal.        Behavior: Behavior normal.    A Medical screening exam complete IUP confirmed 03/21/2022 Chlamydia positive 03/21/2022, no treatment to date Medication given in MAU with antiemetic per patient request Outpatient prescription for Simethicone per patient request Reassured patient at her current GA locus of pain is remote from her pregnancy  P Discharge from MAU in stable condition Patient may return to MAU as needed   F/U: Patient is scheduled to see her PCP Monday for discussion of her upper abdominal pain  Meds ordered this encounter  Medications   azithromycin (ZITHROMAX) tablet 1,000 mg   ondansetron (ZOFRAN-ODT) disintegrating tablet 4 mg   acetaminophen (TYLENOL) tablet 650 mg   simethicone (GAS-X) 80 MG chewable tablet    Sig: Chew 1 tablet (80 mg total) by mouth every 6  (six) hours as needed for flatulence.    Dispense:  30 tablet    Refill:  0    Order Specific Question:   Supervising Provider    Answer:   Inez Catalina [id number]   promethazine (PHENERGAN) 12.5 MG tablet    Sig: Take 1 tablet (12.5 mg total) by mouth every 6 (six) hours as needed for nausea or vomiting.    Dispense:  30 tablet    Refill:  0    Order Specific Question:   Supervising Provider    Answer:   Inez Catalina [id number]   Mallie Snooks, [place], MSN, CNM Certified Nurse Midwife, [facility] for [employer], [place]

## 2022-03-27 NOTE — Discharge Instructions (Signed)

## 2022-03-27 NOTE — MAU Note (Signed)
Rachel Lloyd is a 37 y.o. at 46w4dhere in MAU reporting: lower abdominal pain since last week.  Reports was notified that she has a STI and needs treatment.  Denies VB LMP: NA Onset of complaint: last week Pain score: 10 Vitals:   03/27/22 1725  BP: 110/68  Pulse: 86  Resp: 20  Temp: 98.4 F (36.9 C)  SpO2: 100%     FHT:NA Lab orders placed from triage:   None

## 2022-03-28 ENCOUNTER — Other Ambulatory Visit: Payer: Self-pay | Admitting: Student

## 2022-03-28 MED ORDER — AZITHROMYCIN 500 MG PO TABS: 1000.0000 mg | ORAL_TABLET | Freq: Once | ORAL | 0 refills | Status: AC

## 2022-03-29 NOTE — Telephone Encounter (Signed)
Called to let patient know that I sent 1g of Azithromycin to her pharmacy to be taken all at once (2 pills). She should be retested in 1 month for test of cure. She should have her partner tested and treated and should avoid sex until 1 week after treatment.

## 2022-03-30 NOTE — Telephone Encounter (Signed)
Patient called and rx w/ instructions discussed

## 2022-03-31 ENCOUNTER — Encounter: Payer: Self-pay | Admitting: Family Medicine

## 2022-03-31 ENCOUNTER — Telehealth: Payer: Self-pay

## 2022-03-31 ENCOUNTER — Ambulatory Visit (INDEPENDENT_AMBULATORY_CARE_PROVIDER_SITE_OTHER): Payer: Medicaid Other | Admitting: Family Medicine

## 2022-03-31 VITALS — Ht 62.0 in | Wt 139.4 lb

## 2022-03-31 DIAGNOSIS — Z3200 Encounter for pregnancy test, result unknown: Secondary | ICD-10-CM | POA: Diagnosis not present

## 2022-03-31 LAB — POCT URINE PREGNANCY: Preg Test, Ur: POSITIVE — AB

## 2022-03-31 MED ORDER — DOXYLAMINE-PYRIDOXINE 10-10 MG PO TBEC: DELAYED_RELEASE_TABLET | ORAL | 0 refills | Status: DC

## 2022-03-31 NOTE — Patient Instructions (Signed)
It was wonderful to see you today.  Please bring ALL of your medications with you to every visit.   Today we talked about:  Please start taking a prenatal vitamin.   I prescribed diclegus nausea medication as well.   I have ordered a follow up ultrasound in one more week.   Thank you for choosing [facility].   Please call [phone] with any questions about today's appointment.  Please be sure to schedule follow up at the front desk before you leave today.   Lowry Ram, MD  Family Medicine
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
## 2022-03-31 NOTE — Telephone Encounter (Signed)
Spoke with patient. Informed of u/sound at [facility] on Mar. 26th at 12:30. Also informed patient that she will have to have a full bladder and to go through entrance C. Patient understood  Salvatore Marvel, CMA

## 2022-04-08 ENCOUNTER — Ambulatory Visit (HOSPITAL_COMMUNITY)
Admission: RE | Admit: 2022-04-08 | Discharge: 2022-04-08 | Disposition: A | Discharge status: Home / Self Care | Payer: Medicaid Other | Source: Ambulatory Visit | Attending: Family Medicine | Admitting: Family Medicine

## 2022-04-08 DIAGNOSIS — Z3200 Encounter for pregnancy test, result unknown: Secondary | ICD-10-CM | POA: Diagnosis not present

## 2022-04-08 DIAGNOSIS — Z3689 Encounter for other specified antenatal screening: Secondary | ICD-10-CM | POA: Diagnosis not present

## 2022-04-08 DIAGNOSIS — Z3A01 Less than 8 weeks gestation of pregnancy: Secondary | ICD-10-CM | POA: Diagnosis not present

## 2022-04-10 ENCOUNTER — Telehealth: Payer: Self-pay | Admitting: Family Medicine

## 2022-04-10 NOTE — Telephone Encounter (Signed)
Covering for Dr. Gwendolyn Lima while she is out of the office. I spoke with patient and expressed my condolences on her failed pregnancy on OB US. Patient was understandably sad to hear the news. We discussed further options of expectant management with careful watching of bleeding patterns and signs of anemia versus consultation with an OB/GYN for medical management. Patient states she canceled her appointment with her new OB/GYN today because it was too expensive to go. However, she asked if she should reschedule the appointment, and I recommended she do follow up with them to ensure she is getting the care she needs. She states that she will call family members to speak with during this time. I advised her to call our clinic in the future for help with anything she may need.

## 2022-04-15 NOTE — Telephone Encounter (Signed)
Patient calls nurse line requesting a referral to OBGYN.   She is requesting a D/C for pregnancy loss. She reports she went to [facility] this morning, however they wanted her to pay "money" for procedure.   Patient reports she has started to have abdominal cramping, however denies any bleeding at this time.   Will forward to provider who saw patient for pregnancy.

## 2022-04-16 ENCOUNTER — Inpatient Hospital Stay (HOSPITAL_COMMUNITY)
Admission: AD | Admit: 2022-04-16 | Discharge: 2022-04-16 | Disposition: A | Discharge status: Home / Self Care | Payer: Medicaid Other | Attending: Obstetrics & Gynecology | Admitting: Obstetrics & Gynecology

## 2022-04-16 DIAGNOSIS — Z3A01 Less than 8 weeks gestation of pregnancy: Secondary | ICD-10-CM | POA: Diagnosis not present

## 2022-04-16 DIAGNOSIS — O021 Missed abortion: Secondary | ICD-10-CM | POA: Insufficient documentation

## 2022-04-16 DIAGNOSIS — O209 Hemorrhage in early pregnancy, unspecified: Secondary | ICD-10-CM

## 2022-04-16 DIAGNOSIS — N898 Other specified noninflammatory disorders of vagina: Secondary | ICD-10-CM | POA: Diagnosis present

## 2022-04-16 LAB — WET PREP, GENITAL
Sperm: NONE SEEN
Trich, Wet Prep: NONE SEEN
WBC, Wet Prep HPF POC: <10 (ref <10)
Yeast Wet Prep HPF POC: NONE SEEN

## 2022-04-16 LAB — CBC
HCT: 37.3 % (ref 36.0–46.0)
Hemoglobin: 12.8 g/dL (ref 12.0–15.0)
MCH: 30.9 pg (ref 26.0–34.0)
MCHC: 34.3 g/dL (ref 30.0–36.0)
MCV: 90.1 fL (ref 80.0–100.0)
Platelets: 270 10*3/uL (ref 150–400)
RBC: 4.14 MIL/uL (ref 3.87–5.11)
RDW: 12.4 % (ref 11.5–15.5)
WBC: 7 10*3/uL (ref 4.0–10.5)
nRBC: 0 % (ref 0.0–0.2)

## 2022-04-16 LAB — URINALYSIS, ROUTINE W REFLEX MICROSCOPIC
Bacteria, UA: NONE SEEN
Bilirubin Urine: NEGATIVE
Glucose, UA: NEGATIVE mg/dL
Ketones, ur: NEGATIVE mg/dL
Leukocytes,Ua: NEGATIVE
Nitrite: NEGATIVE
Protein, ur: NEGATIVE mg/dL
Specific Gravity, Urine: 1.02 (ref 1.005–1.030)
pH: 6 (ref 5.0–8.0)

## 2022-04-16 LAB — HCG, QUANTITATIVE, PREGNANCY: hCG, Beta Chain, Quant, S: 25682 m[IU]/mL — ABNORMAL HIGH (ref <5)

## 2022-04-16 MED ORDER — OXYCODONE-ACETAMINOPHEN 5-325 MG PO TABS: 1.0000 | ORAL_TABLET | Freq: Four times a day (QID) | ORAL | 0 refills | Status: DC | PRN

## 2022-04-16 MED ORDER — MISOPROSTOL 200 MCG PO TABS: ORAL_TABLET | ORAL | 1 refills | Status: DC

## 2022-04-16 NOTE — MAU Note (Addendum)
Rachel Lloyd is a 37 y.o. at 110w6d here in MAU reporting: was treated last wk for an STD. Now having a d/c and itching- started 2 days ago.  For 2 days now her back been cramping. Cramping on the side, now cramping in lower abd woke her. Noted bleeding, small amt.  Been feeling real out of it the last few days. Has been constipated, "last was the other day" Onset of complaint: 2-3 days Pain score: back 8/, abd 8 Vitals:   04/16/22 0948  BP: 98/64  Pulse: 72  Resp: 17  Temp: 98.4 F (36.9 C)  SpO2: 100%     Lab orders placed from triage:  urine    [place] 3/26  failed preg.

## 2022-04-16 NOTE — MAU Provider Note (Signed)
History     CSN: [id number]  Arrival date and time: 04/16/22 0916   None     Chief Complaint  Patient presents with   Vaginal Discharge   HPI Rachel Lloyd is a 37 y.o. [id number] at [redacted]w[redacted]d by LMP who presents to MAU for abdominal cramping, vaginal bleeding, and vaginal itching. Patient recently diagnosed with missed abortion on 3/26. She reports her PCP contacted her a few days later and offered expectant management vs follow up with GYN. She opted for GYN referral but no appointment was made. She reports yesterday she started having lower abdominal cramping and dark brown/red blood with wiping. She is wearing a pad but reports only a small amount and is not heavy like a period. She denies passing clots or tissue. She reports she was also diagnosed with chlamydia and just took her medications last week. She is having vaginal itching. She reports "I just want this over with and I feel like I'm not getting the help I need". She reports in the past when she had a miscarriage she was offered "the pills" which she is requesting today.   OB History     Gravida  11   Para  4   Term  4   Preterm  0   AB  5   Living  4      SAB  1   IAB  4   Ectopic  0   Multiple      Live Births  4           Past Medical History:  Diagnosis Date   Chlamydia    Hypertension    During second pregnancy   Mental disorder 2010   bipolar   Pneumonia    Trichimoniasis     Past Surgical History:  Procedure Laterality Date   INDUCED ABORTION     MOUTH SURGERY     as a child   WISDOM TOOTH EXTRACTION      Family History  Problem Relation Age of Onset   Hypertension Mother    Cancer Mother    Fibroids Mother    Healthy Father    ADD / ADHD Brother    Hypertension Maternal Grandmother    Anesthesia problems Neg Hx     Social History   Tobacco Use   Smoking status: Never   Smokeless tobacco: Never  Vaping Use   Vaping Use: Never used  Substance Use Topics   Alcohol use:  Yes    Comment: weekly   Drug use: Yes    Types: Marijuana    Allergies:  Allergies  Allergen Reactions   Penicillins Anaphylaxis    Has taken cephalosprorins Has patient had a PCN reaction causing immediate rash, facial/tongue/throat swelling, SOB or lightheadedness with hypotension: Yes Has patient had a PCN reaction causing severe rash involving mucus membranes or skin necrosis: Yes Has patient had a PCN reaction that required hospitalization Yes Has patient had a PCN reaction occurring within the last 10 years: No If all of the above answers are "NO", then may proceed with Cephalosporin use.   Penicillin G Swelling   Latex Itching and Rash   Latex Rash    No medications prior to admission.   Review of Systems  Constitutional: Negative.   Gastrointestinal:  Positive for abdominal pain (cramping).  Genitourinary:  Positive for vaginal bleeding (spotting).       Vaginal itching   All other systems reviewed and are negative.  Physical Exam  Blood pressure 98/64, pulse 72, temperature 98.4 F (36.9 C), temperature source Oral, resp. rate 17, height 5\' 2"  (1.575 m), weight 63.6 kg, last menstrual period 12/21/2021, SpO2 100 %.  Physical Exam Vitals and nursing note reviewed.  Constitutional:      General: She is not in acute distress. Eyes:     Extraocular Movements: Extraocular movements intact.     Pupils: Pupils are equal, round, and reactive to light.  Cardiovascular:     Rate and Rhythm: Normal rate.  Pulmonary:     Effort: Pulmonary effort is normal.  Abdominal:     Palpations: Abdomen is soft.     Tenderness: There is no abdominal tenderness.  Genitourinary:    Comments: Patient self swabbed Musculoskeletal:        General: Normal range of motion.     Cervical back: Normal range of motion.  Skin:    General: Skin is warm and dry.  Neurological:     General: No focal deficit present.     Mental Status: She is alert and oriented to person, place, and  time.  Psychiatric:        Mood and Affect: Mood normal.        Behavior: Behavior normal.    Results for orders placed or performed during the hospital encounter of 04/16/22 (from the past 24 hour(s))  Urinalysis, Routine w reflex microscopic -Urine, Clean Catch     Status: Abnormal   Collection Time: 04/16/22  9:59 AM  Result Value Ref Range   Color, Urine YELLOW YELLOW   APPearance HAZY (A) CLEAR   Specific Gravity, Urine 1.020 1.005 - 1.030   pH 6.0 5.0 - 8.0   Glucose, UA NEGATIVE NEGATIVE mg/dL   Hgb urine dipstick SMALL (A) NEGATIVE   Bilirubin Urine NEGATIVE NEGATIVE   Ketones, ur NEGATIVE NEGATIVE mg/dL   Protein, ur NEGATIVE NEGATIVE mg/dL   Nitrite NEGATIVE NEGATIVE   Leukocytes,Ua NEGATIVE NEGATIVE   RBC / HPF 0-5 0 - 5 RBC/hpf   WBC, UA 0-5 0 - 5 WBC/hpf   Bacteria, UA NONE SEEN NONE SEEN   Squamous Epithelial / HPF 11-20 0 - 5 /HPF   Mucus PRESENT   Wet prep, genital     Status: Abnormal   Collection Time: 04/16/22 10:42 AM   Specimen: Vaginal  Result Value Ref Range   Yeast Wet Prep HPF POC NONE SEEN NONE SEEN   Trich, Wet Prep NONE SEEN NONE SEEN   Clue Cells Wet Prep HPF POC PRESENT (A) NONE SEEN   WBC, Wet Prep HPF POC <10 <10   Sperm NONE SEEN   CBC     Status: None   Collection Time: 04/16/22 11:29 AM  Result Value Ref Range   WBC 7.0 4.0 - 10.5 K/uL   RBC 4.14 3.87 - 5.11 MIL/uL   Hemoglobin 12.8 12.0 - 15.0 g/dL   HCT 37.3 36.0 - 46.0 %   MCV 90.1 80.0 - 100.0 fL   MCH 30.9 26.0 - 34.0 pg   MCHC 34.3 30.0 - 36.0 g/dL   RDW 12.4 11.5 - 15.5 %   Platelets 270 150 - 400 K/uL   nRBC 0.0 0.0 - 0.2 %    MAU Course  Procedures  MDM UA Wet prep CBC, HCG  Reviewed previous ultrasound results which confirms MAB. CBC stable, urine and wet prep negative. No GC/CT today as patient reports she only took medication last week. HCG drawn but patient does not need to wait  for results.  Discussed management of missed abortion: expectant management vs  misoprostol vs D&E. Risks and benefits of all modalities discussed; all questions answered. Patient opted for misoprostol administration. Risks and benefits of medical management were carefully explained, including a success rate of 80-90%, the need for another person to be at home with her, and to call/come in if she had heavy bleeding, dizziness, or severe pain not relieved by medication. Verbal consent was obtained. Misoprostol 800 mcg with 1 refill & oxycodone were prescribed; written instructions given to patient. She will follow up in one week; if there has been no passage of pregnancy, will consider D&E. Bleeding precautions reviewed; she was told to call clinic or come to MAU for any concerns.  Assessment and Plan   1. Missed abortion   2. [redacted] weeks gestation of pregnancy   3. Vaginal bleeding affecting early pregnancy    - Discharge home in stable condition - Rx for cytotec and percocet sent to pharmacy - Return precautions given. Return to MAU as needed for new/worsening symptoms - Follow up in 1-2 weeks. Message sent to [facility].   Renee Harder, CNM 04/16/2022, 12:22 PM

## 2022-04-16 NOTE — Discharge Instructions (Signed)
What to expect after the misoprostol  Cramping, moderate to heavy bleeding, and moderate pain are normal parts of the abortion process as your uterus passes the pregnancy. Most women pass blood clots.  Cramping usually starts one to four hours after you place the misoprostol in your vagina. Bleeding usually starts between 30 minutes to four hours after you place the misoprostol in your vagina. However, it can take up to 24 hours for some women. Heavy bleeding and strong cramps usually last between one and four hours. Call or return to the hospital if you soak through more than two large maxi-pads per hour for three hours in a row.  For pain: Resting and using a heating pad or hot water bottle may help. Both Vicodin and ibuprofen usually help with the pain. You may use either one or both together. Call if your pain is not manageable with Vicodin and ibuprofen.  Vicodin: You may take one or two tablets of Vicodin every four to six hours. You may take the first pill as soon as you feel you need something for the pain. Vicodin may make you feel sleepy, nauseated, or dizzy. Do not exceed this dose.  Ibuprofen (Motrin, Advil, etc.): You may take 800mg of ibuprofen (four over-the-counter tablets) every six to eight hours. You may take the first dose of pills as soon as you feel you need something for the pain. Ibuprofen usually does not cause side effects such as sleepiness, nausea, or dizziness.  Other misoprostol side effects:  The following side effects are not dangerous and usually only last one to four hours. If any of these side effects make you very uncomfortable, you may treat your symptoms with over-the-counter medicines. Call if over-the-counter medicines do not relieve your symptoms.  Nausea or vomiting  Call if you vomit several times and cannot hold down liquids. Over the counter treatment: Benadryl 25mg to 50mg every six hours as needed, or Dramamine 25mg to 50mg every six hours as  needed.  Diarrhea  Call if you have several episodes of diarrhea lasting more than four to five hours and you are having trouble drinking liquids (you may be getting dehydrated). Over the counter treatment: Imodium 2 tablets (4mg) once.  Fever, chills  Misoprostol may cause fever or chills in the first 24 hours. After 24 hours, fever or chills may be a sign of an infection and you should call the clinic. If you already took Tylenol, Vicodin (which has 500mg of Tylenol in it), or ibuprofen for pain, do not take more of the same medication for fever or chills. Call the clinic if your fever is higher than 101 F (or 39 C). Over the counter treatment: Tylenol 500 to 650mg every four hours, Ibuprofen 800mg (four over-the-counter pills) every six to eight hours. How to tell if the abortion is complete  Most women have bleeding and painful cramping. As you pass the pregnancy, the bleeding is usually heavy and the cramping very strong. This usually lasts one to four hours. Most women pass some blood clots in the toilet and the pregnancy is often one of those clots. After the pregnancy passes, the cramps decrease and the bleeding slows down significantly.  Within a few hours after passing the pregnancy, cramps and bleeding should be much improved  

## 2022-05-07 ENCOUNTER — Encounter: Payer: Self-pay | Admitting: Family Medicine

## 2022-05-07 ENCOUNTER — Other Ambulatory Visit (HOSPITAL_COMMUNITY)
Admission: RE | Admit: 2022-05-07 | Discharge: 2022-05-07 | Disposition: A | Discharge status: Home / Self Care | Payer: Medicaid Other | Source: Ambulatory Visit | Attending: Obstetrics & Gynecology | Admitting: Obstetrics & Gynecology

## 2022-05-07 ENCOUNTER — Other Ambulatory Visit: Payer: Self-pay

## 2022-05-07 ENCOUNTER — Ambulatory Visit: Payer: Medicaid Other | Admitting: Obstetrics & Gynecology

## 2022-05-07 ENCOUNTER — Encounter: Payer: Self-pay | Admitting: Obstetrics & Gynecology

## 2022-05-07 VITALS — BP 118/76 | HR 87 | Ht 62.0 in | Wt 138.8 lb

## 2022-05-07 DIAGNOSIS — Z202 Contact with and (suspected) exposure to infections with a predominantly sexual mode of transmission: Secondary | ICD-10-CM | POA: Insufficient documentation

## 2022-05-07 DIAGNOSIS — Z113 Encounter for screening for infections with a predominantly sexual mode of transmission: Secondary | ICD-10-CM | POA: Insufficient documentation

## 2022-05-07 LAB — POCT URINALYSIS DIP (DEVICE)
Bilirubin Urine: NEGATIVE
Glucose, UA: NEGATIVE mg/dL
Ketones, ur: NEGATIVE mg/dL
Nitrite: NEGATIVE
Protein, ur: NEGATIVE mg/dL
Specific Gravity, Urine: >=1.03 (ref 1.005–1.030)
Urobilinogen, UA: 0.2 mg/dL (ref 0.0–1.0)
pH: 5.5 (ref 5.0–8.0)

## 2022-05-07 LAB — POCT PREGNANCY, URINE: Preg Test, Ur: NEGATIVE

## 2022-05-07 NOTE — Progress Notes (Signed)
Patient ID: Rachel Lloyd, female   DOB: May 06, 1985, 37 y.o.   MRN: [id number]   SUBJECTIVE  HPI:  Rachel Lloyd is a 37 y.o. [id number]  who miscarried at 9 weeks  and  comes for f/u. Bleeding has stopped. She requests sterilization. The patient denies abdominal pain or vaginal bleeding.   Past Medical History:  Diagnosis Date   Chlamydia    Hypertension    During second pregnancy   Mental disorder 2010   bipolar   Pneumonia    Trichimoniasis    Past Surgical History:  Procedure Laterality Date   INDUCED ABORTION     MOUTH SURGERY     as a child   WISDOM TOOTH EXTRACTION     Social History   Socioeconomic History   Marital status: Single    Spouse name: Not on file   Number of children: Not on file   Years of education: Not on file   Highest education level: Not on file  Occupational History   Not on file  Tobacco Use   Smoking status: Never   Smokeless tobacco: Never  Vaping Use   Vaping Use: Never used  Substance and Sexual Activity   Alcohol use: Yes    Comment: weekly   Drug use: Yes    Types: Marijuana   Sexual activity: Yes    Birth control/protection: None  Other Topics Concern   Not on file  Social History Narrative   ** Merged History Encounter **       Social Determinants of Health   Financial Resource Strain: Not on file  Food Insecurity: Not on file  Transportation Needs: Not on file  Physical Activity: Not on file  Stress: Stress Concern Present (03/15/2019)   [employer] of Occupational Health - Occupational Stress Questionnaire    Feeling of Stress : Very much  Social Connections: Not on file  Intimate Partner Violence: Not on file   Current Outpatient Medications on File Prior to Visit  Medication Sig Dispense Refill   misoprostol (CYTOTEC) 200 MCG tablet Place four tablets in between your gums and cheeks (two tablets on each side) as instructed. May repeat dose in 24 hours if no bleeding occurs. 4 tablet 1    oxyCODONE-acetaminophen (PERCOCET/ROXICET) 5-325 MG tablet Take 1 tablet by mouth every 6 (six) hours as needed for severe pain. 15 tablet 0   Doxylamine-Pyridoxine 10-10 MG TBEC Take 2 tabs at bedtime. If needed, add another tab in the morning. If needed, add another tab in the afternoon, up to 4 tabs/day. (Patient not taking: Reported on 05/07/2022) 120 tablet 0   promethazine (PHENERGAN) 12.5 MG tablet Take 1 tablet (12.5 mg total) by mouth every 6 (six) hours as needed for nausea or vomiting. (Patient not taking: Reported on 05/07/2022) 30 tablet 0   simethicone (GAS-X) 80 MG chewable tablet Chew 1 tablet (80 mg total) by mouth every 6 (six) hours as needed for flatulence. (Patient not taking: Reported on 05/07/2022) 30 tablet 0   No current facility-administered medications on file prior to visit.   Allergies  Allergen Reactions   Penicillins Anaphylaxis    Has taken cephalosprorins Has patient had a PCN reaction causing immediate rash, facial/tongue/throat swelling, SOB or lightheadedness with hypotension: Yes Has patient had a PCN reaction causing severe rash involving mucus membranes or skin necrosis: Yes Has patient had a PCN reaction that required hospitalization Yes Has patient had a PCN reaction occurring within the last 10 years: No If  all of the above answers are "NO", then may proceed with Cephalosporin use.   Penicillin G Swelling   Latex Itching and Rash   Latex Rash    I have reviewed patient's Past Medical Hx, Surgical Hx, Family Hx, Social Hx, medications and allergies.   Review of Systems Review of Systems  Constitutional: Negative for fever and chills.  Gastrointestinal: Negative for nausea, vomiting, abdominal pain, diarrhea and constipation.  Genitourinary: Negative for dysuria.  Musculoskeletal: Negative for back pain.  Neurological: Negative for dizziness and weakness.    Physical Exam  BP 118/76   Pulse 87   Ht  (1.575 m)   Wt 138 lb 12.8 oz (63 kg)    LMP 12/21/2021 (Approximate) Comment: SAB  Breastfeeding Unknown   BMI 25.39 kg/m   GENERAL: Well-developed, well-nourished female in no acute distress.  HEENT: Normocephalic, atraumatic.   LUNGS: Effort normal ABDOMEN: soft, non-tender HEART: Regular rate  SKIN: Warm, dry and without erythema PSYCH: Normal mood and affect NEURO: Alert and oriented x 4  LAB RESULTS Results for orders placed or performed in visit on 05/07/22 (from the past 24 hour(s))  POCT urinalysis dip (device)     Status: Abnormal   Collection Time: 05/07/22  9:48 AM  Result Value Ref Range   Glucose, UA NEGATIVE NEGATIVE mg/dL   Bilirubin Urine NEGATIVE NEGATIVE   Ketones, ur NEGATIVE NEGATIVE mg/dL   Specific Gravity, Urine >=1.030 1.005 - 1.030   Hgb urine dipstick MODERATE (A) NEGATIVE   pH 5.5 5.0 - 8.0   Protein, ur NEGATIVE NEGATIVE mg/dL   Urobilinogen, UA 0.2 0.0 - 1.0 mg/dL   Nitrite NEGATIVE NEGATIVE   Leukocytes,Ua SMALL (A) NEGATIVE    IMAGING US OB LESS THAN 14 WEEKS WITH OB TRANSVAGINAL  Result Date: 04/09/2022 CLINICAL DATA:  First trimester pregnancy, no fetal pole identified on prior exam, follow-up. LMP EXAM: OBSTETRIC <14 WK [place] AND TRANSVAGINAL OB US TECHNIQUE: Both transabdominal and transvaginal ultrasound examinations were performed for complete evaluation of the gestation as well as the maternal uterus, adnexal regions, and pelvic cul-de-sac. Transvaginal technique was performed to assess early pregnancy. COMPARISON:  None Available. FINDINGS: Intrauterine gestational sac: Present, single sac without membrane Yolk sac:  2 yolk sacs appear to be present Embryo:  Absent Cardiac Activity: N/A Heart Rate: N/A  bpm MSD: 17.81 mm   6 w   5 d Subchorionic hemorrhage:  Small subchronic hemorrhage Maternal uterus/adnexae: Single gestational sac with what appear to be 2 small rounded adjacent yolk sacs. No fetal pole. Failure to visualize a fetal pole more than 10 days after an exam which  demonstrated a gestational sac with a yolk sac is consistent with a definite failed pregnancy. Small corpus luteal cyst RIGHT ovary, ovaries otherwise unremarkable. Trace nonspecific free pelvic fluid. No adnexal masses. IMPRESSION: Single gestational sac is again identified within the uterus, containing two small adjacent yolk sacs but no fetal pole is demonstrated. The failure to visualize an embryo more than 10 days after an exam demonstrating a gestational sac with a yolk sac meets definitive criteria for failed pregnancy. This follows SRU consensus guidelines: Diagnostic Criteria for Nonviable Pregnancy Early in the First Trimester. [facility] [phone]. Small subchronic hemorrhage. Electronically Signed   By: Ulyses Southward M.D.   On: 04/09/2022 17:25    ASSESSMENT 1. Chlamydia contact, treated   2. Screening examination for STD (sexually transmitted disease)     PLAN 37 y.o. [id number] with undesired fertility desires permanent  sterilization. Risks and benefits of laparoscopic tubal sterilization procedure was discussed with the patient including permanence of method, bleeding, infection, injury to surrounding organs, anesthesia and need for additional procedures. Risk failure of 0.5-1% with increased risk of ectopic gestation if pregnancy occurs was also discussed with patient. Patient verbalized understanding and all questions were answered.  The risks of surgery were discussed in detail with the patient including but not limited to: bleeding which may require transfusion or reoperation; infection which may require prolonged hospitalization or re-hospitalization and antibiotic therapy; injury to bowel, bladder, ureters and major vessels or other surrounding organs which may lead to other procedures; formation of adhesions; need for additional procedures including laparotomy or subsequent procedures secondary to intraoperative injury or abnormal pathology; thromboembolic phenomenon; incisional  problems and other postoperative or anesthesia complications.  Patient was told that the likelihood that her condition and symptoms will be treated effectively with this surgical management was very high; the postoperative expectations were also discussed in detail. The patient also understands the alternative treatment options which were discussed in full. All questions were answered.  She was told that she will be contacted by our surgical scheduler regarding the time and date of her surgery; routine preoperative instructions will be given to her by the preoperative nursing team.   Printed patient education handouts about the procedure were given to the patient to review at home.   Adam Phenix, MD  05/07/2022  10:07 AM

## 2022-05-07 NOTE — Progress Notes (Signed)
Pt reports that she feels that she has a UTI, BV.

## 2022-05-08 LAB — CERVICOVAGINAL ANCILLARY ONLY
Bacterial Vaginitis (gardnerella): POSITIVE — AB
Candida Glabrata: NEGATIVE
Candida Vaginitis: POSITIVE — AB
Chlamydia: NEGATIVE
Comment: NEGATIVE
Comment: NEGATIVE
Comment: NEGATIVE
Comment: NEGATIVE
Comment: NEGATIVE
Comment: NORMAL
Neisseria Gonorrhea: NEGATIVE
Trichomonas: NEGATIVE

## 2022-05-15 ENCOUNTER — Telehealth: Payer: Self-pay | Admitting: Lactation Services

## 2022-05-15 MED ORDER — FLUCONAZOLE 150 MG PO TABS: 150.0000 mg | ORAL_TABLET | Freq: Once | ORAL | 0 refills | Status: AC

## 2022-05-15 MED ORDER — METRONIDAZOLE 500 MG PO TABS: 500.0000 mg | ORAL_TABLET | Freq: Two times a day (BID) | ORAL | 0 refills | Status: DC

## 2022-05-15 NOTE — Telephone Encounter (Signed)
Sent in Prescriptions for BV and yeast. Patient notified by My Chart. She did not answer her phone, asked her to check her My Chart message.

## 2022-05-26 ENCOUNTER — Ambulatory Visit (INDEPENDENT_AMBULATORY_CARE_PROVIDER_SITE_OTHER): Payer: Medicaid Other | Admitting: Student

## 2022-05-26 ENCOUNTER — Encounter: Payer: Self-pay | Admitting: Student

## 2022-05-26 VITALS — BP 106/70 | HR 84 | Wt 138.0 lb

## 2022-05-26 DIAGNOSIS — N760 Acute vaginitis: Secondary | ICD-10-CM | POA: Diagnosis not present

## 2022-05-26 DIAGNOSIS — F39 Unspecified mood [affective] disorder: Secondary | ICD-10-CM

## 2022-05-26 DIAGNOSIS — R300 Dysuria: Secondary | ICD-10-CM

## 2022-05-26 DIAGNOSIS — B9689 Other specified bacterial agents as the cause of diseases classified elsewhere: Secondary | ICD-10-CM | POA: Diagnosis not present

## 2022-05-26 DIAGNOSIS — R1010 Upper abdominal pain, unspecified: Secondary | ICD-10-CM

## 2022-05-26 LAB — POCT URINALYSIS DIP (MANUAL ENTRY)
Bilirubin, UA: NEGATIVE
Glucose, UA: NEGATIVE mg/dL
Ketones, POC UA: NEGATIVE mg/dL
Nitrite, UA: POSITIVE — AB
Protein Ur, POC: NEGATIVE mg/dL
Spec Grav, UA: 1.025 (ref 1.010–1.025)
Urobilinogen, UA: 0.2 E.U./dL
pH, UA: 5.5 (ref 5.0–8.0)

## 2022-05-26 LAB — POCT UA - MICROSCOPIC ONLY: Epithelial cells, urine per micros: >20

## 2022-05-26 MED ORDER — NITROFURANTOIN MONOHYD MACRO 100 MG PO CAPS: 100.0000 mg | ORAL_CAPSULE | Freq: Two times a day (BID) | ORAL | 0 refills | Status: AC
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
## 2022-05-27 DIAGNOSIS — R300 Dysuria: Secondary | ICD-10-CM | POA: Insufficient documentation

## 2022-05-27 MED ORDER — METRONIDAZOLE 0.75 % VA GEL: 1.0000 | Freq: Every day | VAGINAL | 0 refills | Status: AC

## 2022-05-27 NOTE — Assessment & Plan Note (Signed)
Patient notes that she has been having pain in her upper abdomen for the last 2 days rated 8 out of 10 and is still feeling.  Only notes that it is sore to touch she does not have the abdominal pain any other time, and is not related to eating or bowel habits.  She denies any changes or difficulty in bowel habits but does note that she has had a tinge of blood in her stool x 2 over the last week.  Patient has not had a period since her miscarriage . Upon examining her abdomen patient's bra line was pressing again on upper abdomen leaving a mark and causing discomfort.  Patient felt this was the source of her pain, and discussed adjusting type of bra for comfort. -CTM -F/u blood in stool at next appointment

## 2022-05-27 NOTE — Assessment & Plan Note (Signed)
Patient notes low mood since loss of pregnancy with 2 fetus.  Patient notes that she finds herself teary-eyed and crying more frequently and has been drinking more frequently.  Patient also notes that she is attempting to stay busy is staying very active and engaged in multiple activities, not trying to get herself time to sit and think about the loss and feel the emotions she is having.  Patient opposed to starting medication and talking to therapist- concerned that therapist may feel that they are better than she is.  Patient denies any SI, HI, but is willing to follow-up.  Discussed my concern for her mood and coping skills she is using to manage her feelings around this miscarriage. - Follow-up 2 weeks - Consider therapy

## 2022-05-27 NOTE — Assessment & Plan Note (Signed)
Patient seen by gynecology on 05-07-2022 for follow-up on her miscarriage, and was noted to have BV and yeast infection.  Patient notes that she was treated for the yeast infection but has not been treated for BV, she has not taken any medication to clear the BV infection.  Patient is wanting treatment for the BV infection with intravaginal gel as opposed to oral medication.  Patient also notes she sometimes gets yeast infections when treating for BV.  Will treat for BV and have patient MyChart message me if she develops symptoms of yeast infection. -metronidazole 5 g 0.75% gel inserted vaginally x 5 days

## 2022-05-27 NOTE — Assessment & Plan Note (Signed)
Patient has 2 weeks of symptoms of dysuria pain when she urinates and foul-smelling urine that was also cloudy.  She also notes that she has been peeing more frequently.  Patient's UA was positive for UTI with nitrite, and leukocytes.  Will treat for complicated UTI. -Macrobid 100 mg twice daily x 5 days

## 2022-05-31 ENCOUNTER — Ambulatory Visit
Admission: EM | Admit: 2022-05-31 | Discharge: 2022-05-31 | Disposition: A | Discharge status: Home / Self Care | Payer: Medicaid Other | Attending: Nurse Practitioner | Admitting: Nurse Practitioner

## 2022-05-31 DIAGNOSIS — S161XXA Strain of muscle, fascia and tendon at neck level, initial encounter: Secondary | ICD-10-CM

## 2022-05-31 DIAGNOSIS — M545 Low back pain, unspecified: Secondary | ICD-10-CM

## 2022-05-31 MED ORDER — NAPROXEN 500 MG PO TABS: 500.0000 mg | ORAL_TABLET | Freq: Two times a day (BID) | ORAL | 0 refills | Status: DC | PRN

## 2022-05-31 MED ORDER — CYCLOBENZAPRINE HCL 10 MG PO TABS: 10.0000 mg | ORAL_TABLET | Freq: Two times a day (BID) | ORAL | 0 refills | Status: DC | PRN
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
## 2022-06-01 ENCOUNTER — Ambulatory Visit: Payer: Self-pay

## 2022-06-16 ENCOUNTER — Other Ambulatory Visit: Payer: Self-pay | Admitting: Obstetrics & Gynecology

## 2022-06-16 DIAGNOSIS — Z302 Encounter for sterilization: Secondary | ICD-10-CM

## 2022-06-16 NOTE — Progress Notes (Signed)
Orders only

## 2022-07-03 IMAGING — US US OB TRANSVAGINAL
1 series · 15 of 28 positions shown · non-contrast
Comparison: 07/14/2020

CLINICAL DATA: Abdomen pain

EXAM:
TRANSVAGINAL OB ULTRASOUND
TECHNIQUE: Transvaginal ultrasound was performed for complete evaluation of the
gestation as well as the maternal uterus, adnexal regions, and
pelvic cul-de-sac.

[Series 1: us ob transvaginal · 36 acquisitions, 15 frames shown]
[im 1/36]
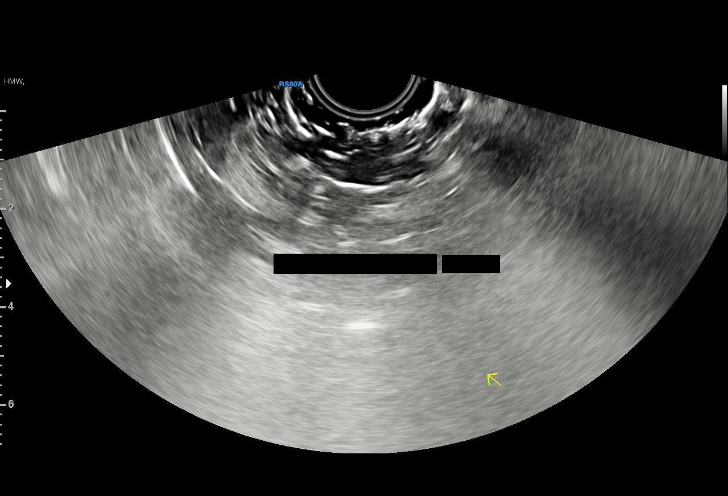
[im 3/36]
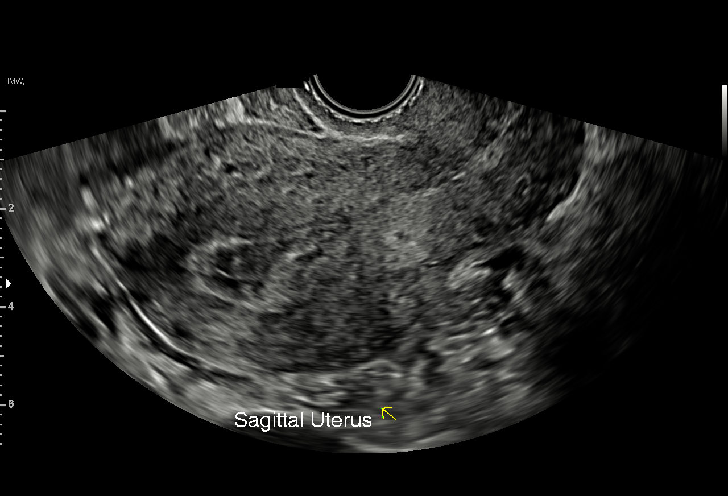
[im 6/36]
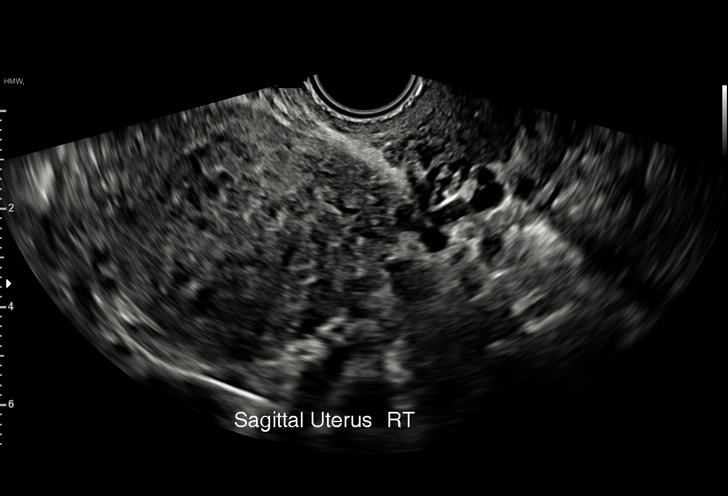
[im 8/36]
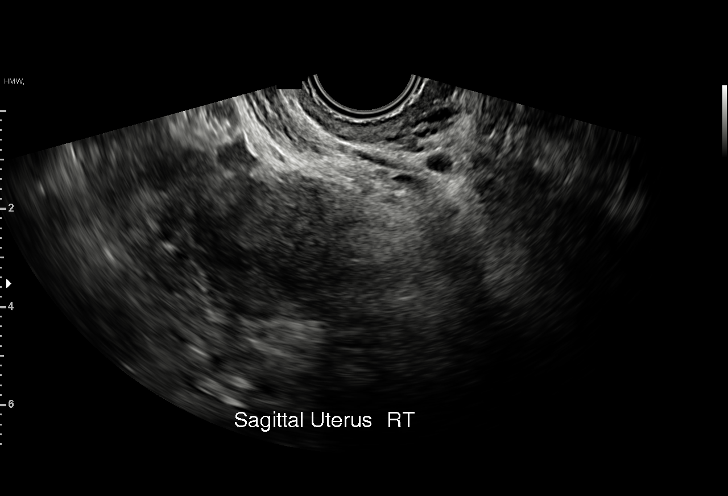
[im 11/36]
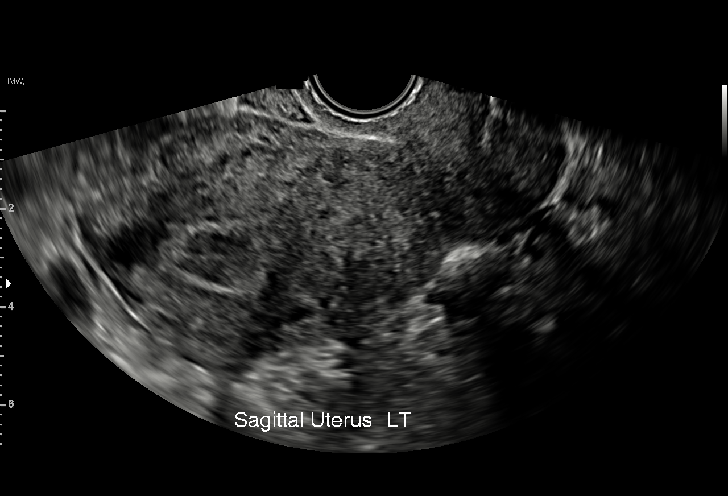
[im 13/36]
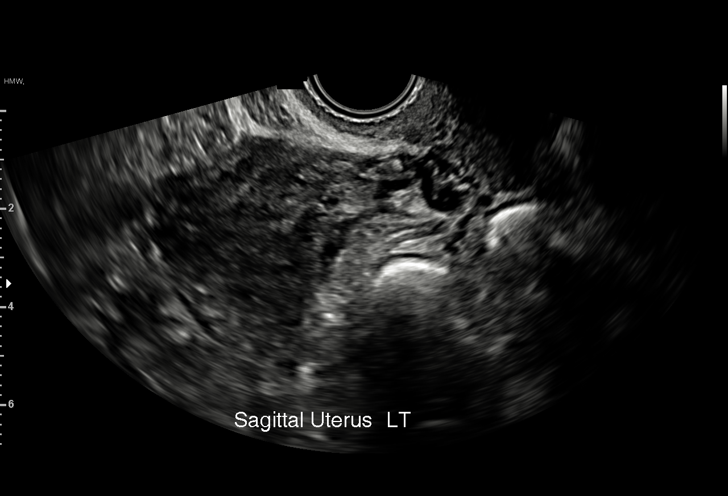
[im 16/36]
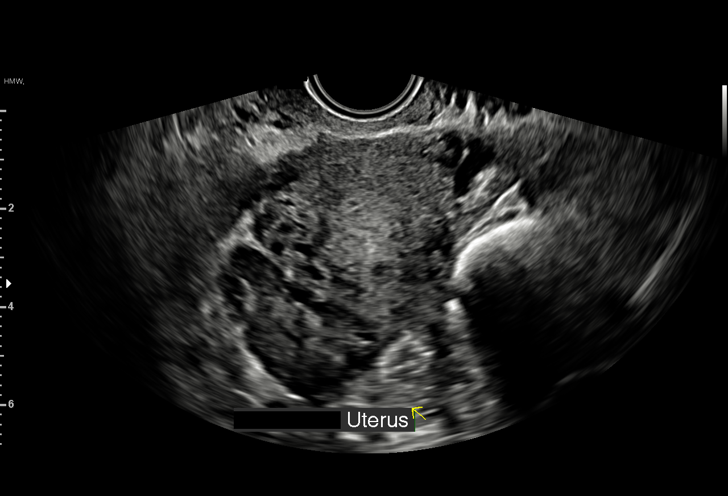
[im 19/36]
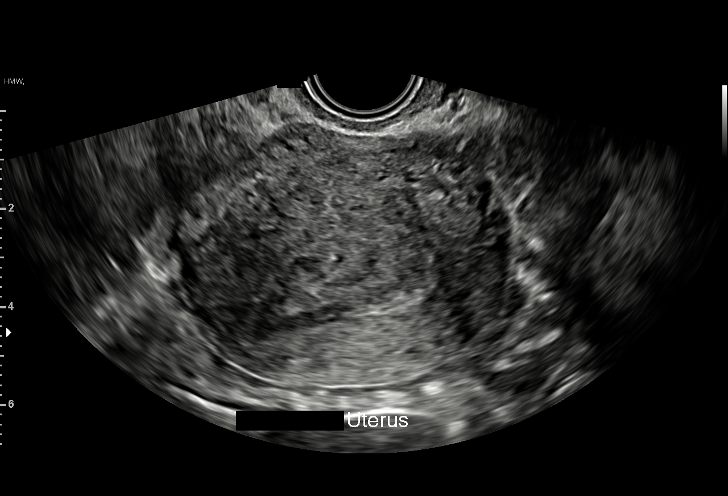
[im 20/36]
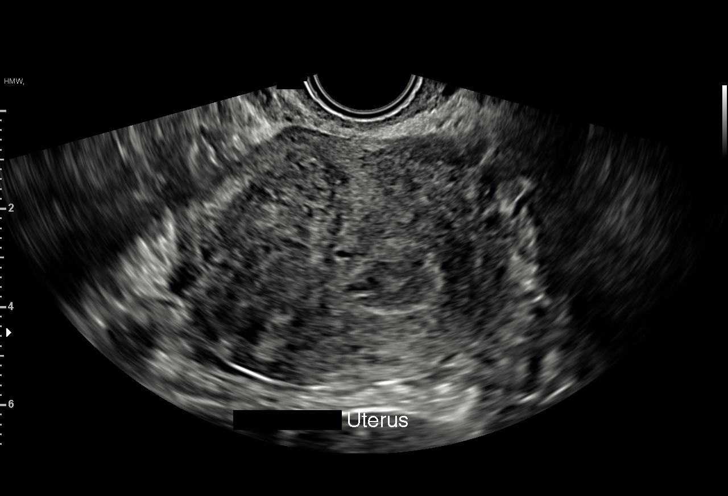
[im 23/36]
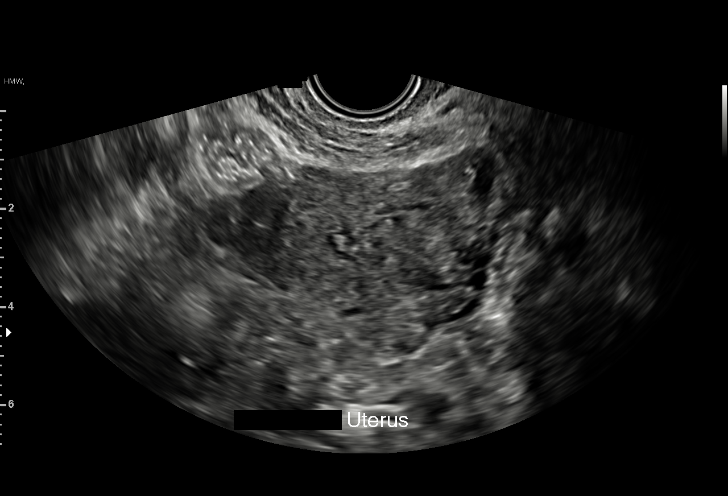
[im 25/36]
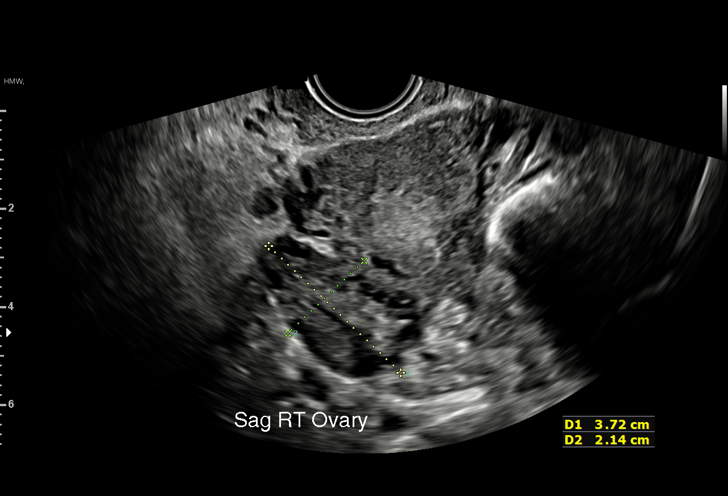
[im 28/36]
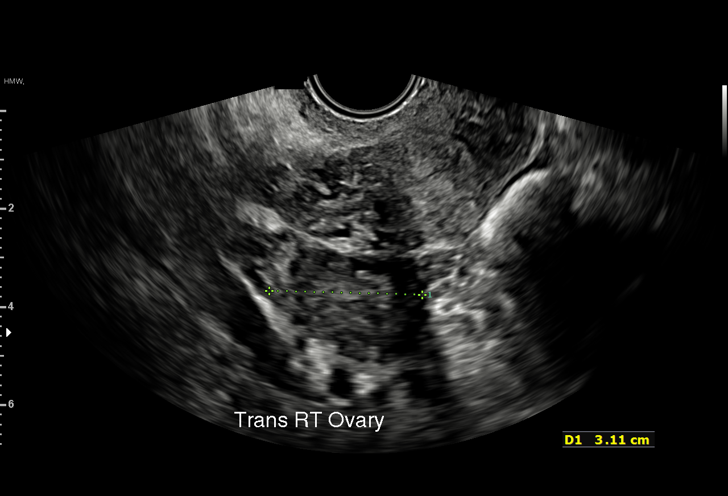
[im 30/36]
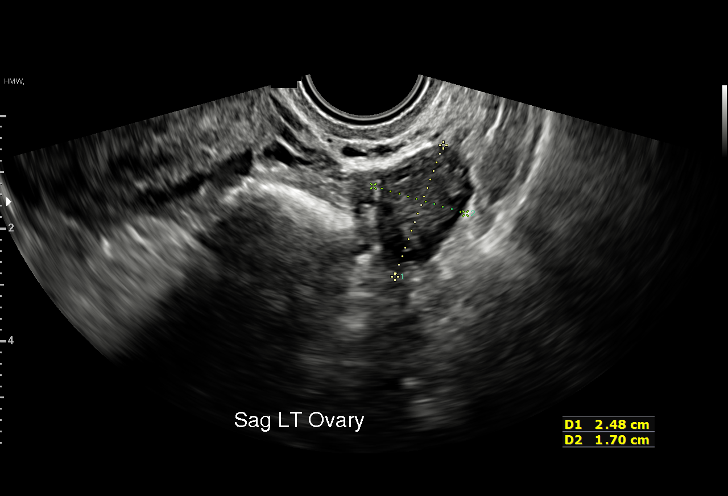
[im 33/36]
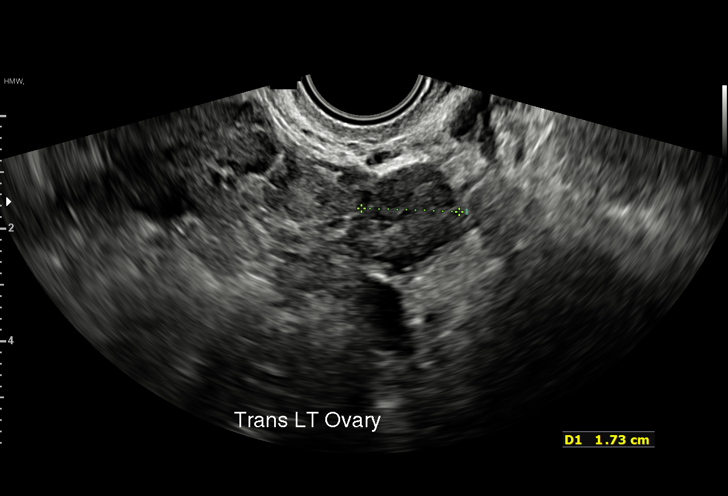
[im 36/36]
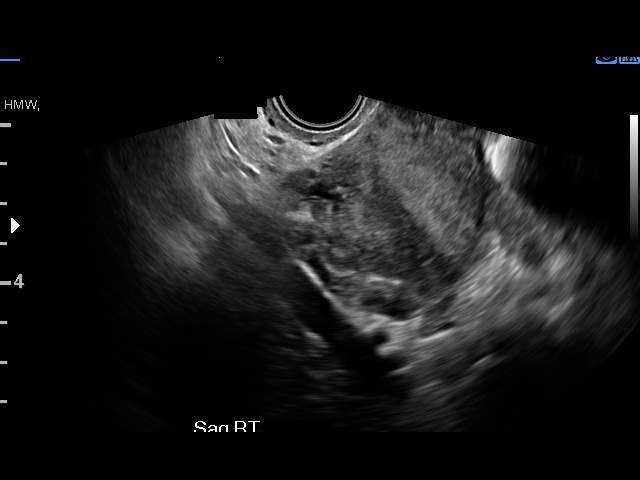

[15 of 28 positions shown; findings below may reference images not displayed]

FINDINGS: Intrauterine gestational sac: None

Yolk sac:  Not Visualized.

Embryo:  Not Visualized.

Maternal uterus/adnexae: Ovaries are within normal limits. Right
ovary measures 3.7 x 2.1 x 3.1 cm. Left ovary measures 2.4 x 1.7 x
1.7 cm. Previously noted tubular mass at the right adnexa is not
identified today.
IMPRESSION: 1. No IUP identified. Previous possible early intrauterine
gestational sac no longer visualized.
2. The previously described tubular mass at the right adnexa is also
not identified.

## 2022-07-03 NOTE — Progress Notes (Addendum)
Rachel Rachel Lloyd returned my call, she said she is not sure what all the Dr. Is going to do, "I did not quite understand."  I explained to Rachel Lloyd that the Dr. Katrinka Blazing to patients what the procedure is, expected outcome, and possible complications.  I instructed Rachel. Webb Lloyd to call the surgeons office, ask for a detailed information.  I called Rachel Lloyd to ask if she had answers to her questions, patient said has not been able to get anyone to speak or to call her back. I asked patient to call me back, prior to 1900 if she does get in touch with the surgeon's office, if not she will need to speak with a nurse on Friday.

## 2022-07-04 ENCOUNTER — Encounter (HOSPITAL_COMMUNITY): Payer: Self-pay | Admitting: Obstetrics & Gynecology

## 2022-07-04 NOTE — Progress Notes (Signed)
After multiple attempts, unable to reach patient via phone.  LMOM with information and instructions for DOS.  PCP - Dr Bess Kinds Cardiologist - n/a  Chest x-ray - n/a EKG - n/a Stress Test - n/a ECHO - n/a Cardiac Cath - n/a  ICD Pacemaker/Loop - n/a  Sleep Study -  n/a CPAP - none  Diabetes - n/a  NPO  STOP now taking any Aspirin (unless otherwise instructed by your surgeon), Aleve, Naproxen, Ibuprofen, Motrin, Advil, Goody's, BC's, all herbal medications, fish oil, and all vitamins.

## 2022-07-07 ENCOUNTER — Encounter (HOSPITAL_COMMUNITY): Payer: Self-pay | Admitting: Anesthesiology

## 2022-07-07 ENCOUNTER — Ambulatory Visit (HOSPITAL_COMMUNITY)
Admission: RE | Admit: 2022-07-07 | Payer: Medicaid Other | Source: Home / Self Care | Admitting: Obstetrics & Gynecology

## 2022-07-07 SURGERY — LIGATION, FALLOPIAN TUBE, LAPAROSCOPIC
Anesthesia: Choice | Laterality: Bilateral

## 2022-07-27 ENCOUNTER — Ambulatory Visit (HOSPITAL_COMMUNITY): Payer: Self-pay

## 2022-07-28 ENCOUNTER — Encounter: Payer: Self-pay | Admitting: Student

## 2022-07-28 ENCOUNTER — Ambulatory Visit: Payer: Medicaid Other | Admitting: Student

## 2022-07-28 ENCOUNTER — Other Ambulatory Visit (HOSPITAL_COMMUNITY)
Admission: RE | Admit: 2022-07-28 | Discharge: 2022-07-28 | Disposition: A | Discharge status: Home / Self Care | Payer: Medicaid Other | Source: Ambulatory Visit | Attending: Family Medicine | Admitting: Family Medicine

## 2022-07-28 ENCOUNTER — Ambulatory Visit (INDEPENDENT_AMBULATORY_CARE_PROVIDER_SITE_OTHER): Payer: Medicaid Other | Admitting: Student

## 2022-07-28 ENCOUNTER — Ambulatory Visit: Payer: Medicaid Other

## 2022-07-28 VITALS — BP 107/80 | HR 102 | Ht 62.0 in | Wt 135.4 lb

## 2022-07-28 DIAGNOSIS — B9689 Other specified bacterial agents as the cause of diseases classified elsewhere: Secondary | ICD-10-CM | POA: Insufficient documentation

## 2022-07-28 DIAGNOSIS — Z3200 Encounter for pregnancy test, result unknown: Secondary | ICD-10-CM

## 2022-07-28 DIAGNOSIS — F39 Unspecified mood [affective] disorder: Secondary | ICD-10-CM | POA: Diagnosis not present

## 2022-07-28 DIAGNOSIS — N898 Other specified noninflammatory disorders of vagina: Secondary | ICD-10-CM

## 2022-07-28 DIAGNOSIS — N760 Acute vaginitis: Secondary | ICD-10-CM | POA: Diagnosis not present

## 2022-07-28 LAB — POCT URINE PREGNANCY: Preg Test, Ur: NEGATIVE

## 2022-07-28 NOTE — Patient Instructions (Addendum)
It was great to see you today! Thank you for choosing [facility] for your primary care. Rachel Lloyd was seen for vaginal discharge.  Today we addressed: Therapy: I have provided you a list of therapists that take Medicaid We pregnancy test to do and swabbed for vaginal discharge.  I do recommend you follow-up with your PCP about long-term therapy for BV and contraception.  If you haven't already, sign up for My Chart to have easy access to your labs results, and communication with your primary care physician.  We are checking some labs today. If they are abnormal, I will call you. If they are normal, I will send you a MyChart message (if it is active) or a letter in the mail. If you do not hear about your labs in the next 2 weeks, please call the office.  You should return to our clinic Return in about 1 week (around 08/04/2022) for Contraception. Please arrive 15 minutes before your appointment to ensure smooth check in process.  We appreciate your efforts in making this happen.  Thank you for allowing me to participate in your care, Rachel Mattocks, DO 07/28/2022, 3:33 PM PGY-3, Rachel Lloyd    Therapy and Counseling Resources Most providers on this list will take Medicaid. Patients with commercial insurance or Medicare should contact their insurance company to get a list of in network providers.  [facility] (takes children) Location 1: [address], [address] [place], [place] [address] Location 2: [address] [place], [place] [address] [phone]   [facility] (spanish speaking therapist available)(habla espanol)(take medicare and medicaid)  [address], [place], [place] [address], [email or web address] [email or web address] [phone]  [facility]Psychiatry and Counseling [address] [place], [place] [address] [phone]  [facility]   [address] Suite [place], [place] [address]      [phone]  [facility] (spanish available) [address]  [place], [place] [address] [phone]  [facility] (take [facility] and medicare) [address], [address]  [place], [place] [address]       [phone]     [facility] (virtual only) [phone]  [facility] [address] [place], [place] [phone]  [facility]:  [address] [place] [phone]  [facility]:  [address] [place] [phone]  [facility] (under & uninsured) [address]   [place] [phone]    [email or web address]     [facility] [address]  [place]    [phone]  [facility] -[address]     Phone:  [phone]     [facility]-  [address]  [phone]   [facility] [address]      [place], [place] [phone]  [facility]: [address] [place], [place], [place]  [phone]   [facility] (Spanish therapist) [email or web address]  [address] [place]  [address]   [place] [address]    [phone]    [facility]   [address],  [place], [place]  [phone]   [facility]  [address] [place]  [phone]  [facility]  [address] [place], [place]        [phone]  Open Access/Walk In Clinic under & uninsured  [facility]  Health  [address] [place], [place] [phone] Crisis [phone]  [facility] [place],  (Spanish)   [address], [place]: [phone]) 8:30 - 12; 1 - 2:30  [facility] [employer],  [address] [place], [place]    [phone]):8:30 - 12; 2 - 3PM  [facility] [employer],  [address],  [place]; [phone]):   Mon - Fri 8 AM - 5 PM  [facility] [address] [place]  MWF 12:30 to 3:00 or call to schedule an appointment  [phone]  Specific Provider options Psychology Today  [email or web address]/us click on find a therapist  enter your zip code left side and select or tailor a therapist for your specific need.   [facility] Provider Directory [email or web address]  (Medicaid)   Follow all drop down to find a provider  Social Support program [facility] [place] [phone] or PhotoSolver.pl [address], [place], [place] Recovery support and educational   24- Hour Availability:   [facility]  [address] [place], [place] [phone] Crisis [phone]  [facility] [phone]  [place] [facility]  [phone]   [facility]  [phone] (after hours)  Therapeutic Alternative/Mobile Crisis   3346250777  [place] National Suicide Hotline  [phone] Len Childs)  Call 911 or go to emergency room  [facility]  [phone]);  [place] and [employer]  [phone]); [place], [place], [place], [place], [place], [place], [place]

## 2022-07-28 NOTE — Assessment & Plan Note (Signed)
Most likely BV.  Recommended she advise discussing BV prophylaxis with PCP.

## 2022-07-28 NOTE — Assessment & Plan Note (Signed)
Provided therapy resources appropriate for Medicaid.

## 2022-07-28 NOTE — Progress Notes (Signed)
  SUBJECTIVE:   CHIEF COMPLAINT / HPI:   Endorses vaginal discharge.  Her last period was 1 month ago.  She is unsure if she is pregnant.  She does have an interest in Nexplanon.  She is also interested in starting therapy as previously discussed with her PCP.  PERTINENT  PMH / PSH: Bipolar disorder, recent missed abortion  Patient Care Team: Bess Kinds, MD as PCP - General (Family Medicine) OBJECTIVE:  BP 107/80   Pulse (!) 102   Ht 5\' 2"  (1.575 m)   Wt 135 lb 6.4 oz (61.4 kg)   LMP 06/27/2022 (Approximate)   SpO2 100%   BMI 24.76 kg/m  Pelvic exam: VULVA: normal appearing vulva with no masses, tenderness or lesions, VAGINA: vaginal discharge - white, copious, creamy, curd-like, and thick, CERVIX: Unable to appreciate, exam limited by discharge, exam chaperoned by Cleatrice Burke.   ASSESSMENT/PLAN:  Vaginal discharge Assessment & Plan: Most likely BV.  Recommended she advise discussing BV prophylaxis with PCP.  Orders: -     Cervicovaginal ancillary only  Possible pregnancy Assessment & Plan: Negative.  Advised discussing Nexplanon placement with PCP at next visit and remain sexually inactive until then.  Orders: -     POCT urine pregnancy  Mood disorder [facility]) Assessment & Plan: Provided therapy resources appropriate for Medicaid.   Return in about 1 week (around 08/04/2022) for Contraception. Shelby Mattocks, DO 07/28/2022, 4:28 PM PGY-3, [facility]

## 2022-07-28 NOTE — Assessment & Plan Note (Signed)
Negative.  Advised discussing Nexplanon placement with PCP at next visit and remain sexually inactive until then.

## 2022-07-31 ENCOUNTER — Encounter: Payer: Self-pay | Admitting: Student

## 2022-07-31 ENCOUNTER — Other Ambulatory Visit: Payer: Self-pay | Admitting: Student

## 2022-07-31 DIAGNOSIS — B9689 Other specified bacterial agents as the cause of diseases classified elsewhere: Secondary | ICD-10-CM

## 2022-07-31 LAB — CERVICOVAGINAL ANCILLARY ONLY
Bacterial Vaginitis (gardnerella): POSITIVE — AB
Candida Glabrata: NEGATIVE
Candida Vaginitis: NEGATIVE
Chlamydia: NEGATIVE
Comment: NEGATIVE
Comment: NEGATIVE
Comment: NEGATIVE
Comment: NEGATIVE
Comment: NEGATIVE
Comment: NORMAL
Neisseria Gonorrhea: NEGATIVE
Trichomonas: NEGATIVE

## 2022-07-31 MED ORDER — METRONIDAZOLE 500 MG PO TABS
500.0000 mg | ORAL_TABLET | Freq: Two times a day (BID) | ORAL | 0 refills | Status: DC
Start: 2022-07-31 — End: 2022-07-31

## 2022-07-31 MED ORDER — METRONIDAZOLE 0.75 % VA GEL
1.0000 | Freq: Every day | VAGINAL | 0 refills | Status: AC
Start: 2022-07-31 — End: 2022-08-07

## 2022-08-07 ENCOUNTER — Ambulatory Visit (INDEPENDENT_AMBULATORY_CARE_PROVIDER_SITE_OTHER): Payer: Medicaid Other | Admitting: Family Medicine

## 2022-08-07 VITALS — BP 124/66 | HR 96 | Ht 62.0 in | Wt 134.6 lb

## 2022-08-07 DIAGNOSIS — Z30017 Encounter for initial prescription of implantable subdermal contraceptive: Secondary | ICD-10-CM | POA: Diagnosis not present

## 2022-08-07 DIAGNOSIS — Z30011 Encounter for initial prescription of contraceptive pills: Secondary | ICD-10-CM | POA: Diagnosis not present

## 2022-08-07 LAB — POCT URINE PREGNANCY: Preg Test, Ur: NEGATIVE

## 2022-08-07 MED ORDER — NORGESTIMATE-ETH ESTRADIOL 0.25-35 MG-MCG PO TABS
1.0000 | ORAL_TABLET | Freq: Every day | ORAL | 11 refills | Status: DC
Start: 2022-08-07 — End: 2023-01-13

## 2022-08-07 NOTE — Patient Instructions (Signed)
It was wonderful to see you today.  Today we talked about:  Contraception. We prescribed an oral birth control pill called Sprintec. Remember to take it every day at the same time for the greatest effect. If you miss a day, take the next pill at the normal time, and use a condom when having sex. If you have any questions, or want to discuss birth control again, don't hesitate to reach out.   Thank you for choosing [facility].   Please call [phone] with any questions about today's appointment.  Please arrive at least 15 minutes prior to your scheduled appointments.   If you had blood work today, I will send you a MyChart message or a letter if results are normal. Otherwise, I will give you a call.   If you had a referral placed, they will call you to set up an appointment. Please give [place] a call if you don't hear back in the next 2 weeks.   If you need additional refills before your next appointment, please call your pharmacy first.   Gerrit Heck, DO Family Medicine

## 2022-08-07 NOTE — Assessment & Plan Note (Signed)
After discussion of risks and benefits of contraceptive management options, patient elected to begin use of oral contraceptives. Advised patient that she would need to take the pill consistently, every day at the same time for greatest effect.  -started sprintec 28

## 2022-08-07 NOTE — Assessment & Plan Note (Signed)
Risks and benefits of nexplanon were discussed.

## 2022-08-07 NOTE — Progress Notes (Signed)
    SUBJECTIVE:   CHIEF COMPLAINT / HPI:   37 yo female presenting today for placement of nexplanon. On discussion of risks and benefits, patient was extremely averse to having a procedure today and expressed that she would prefer to go with the pill instead. She has tried depo and mirena in the past and had pregnancies with both options.   Upreg today is negative.   PERTINENT  PMH / PSH: Irregular bleeding.   OBJECTIVE:   BP 124/66   Pulse 96   Ht 5\' 2"  (1.575 m)   Wt 134 lb 9.6 oz (61.1 kg)   LMP 06/27/2022 (Approximate)   SpO2 99%   BMI 24.62 kg/m   General: A&O, anxious demeanor.   ASSESSMENT/PLAN:   Encounter for insertion of subdermal contraceptive Risks and benefits of nexplanon were discussed.  Encounter for initial prescription of contraceptive pills After discussion of risks and benefits of contraceptive management options, patient elected to begin use of oral contraceptives. Advised patient that she would need to take the pill consistently, every day at the same time for greatest effect.  -started sprintec 28       Gerrit Heck, DO [facility]

## 2022-08-08 ENCOUNTER — Encounter: Payer: Self-pay | Admitting: Student

## 2022-08-08 ENCOUNTER — Other Ambulatory Visit: Payer: Self-pay | Admitting: Student

## 2022-08-08 MED ORDER — FLUCONAZOLE 150 MG PO TABS: 150.0000 mg | ORAL_TABLET | Freq: Once | ORAL | 0 refills | Status: AC

## 2022-10-03 ENCOUNTER — Other Ambulatory Visit: Payer: Medicaid Other | Admitting: *Deleted

## 2022-10-03 NOTE — Patient Outreach (Signed)
Care Coordination  10/03/2022  Rachel Lloyd [birth year] [phone]    Successful telephone outreach for initial assessment with RNCM. Patient had an issue with her child and request to complete this telephone outreach on another day. A new telephone appointment was made on 10/09/22 at 1pm. Patient agreed to new date and time.  [name] RN, BSN [place]  [facility] RN Care Coordinator [phone]

## 2022-10-07 ENCOUNTER — Ambulatory Visit (HOSPITAL_COMMUNITY): Payer: Medicaid Other

## 2022-10-08 ENCOUNTER — Ambulatory Visit: Payer: Medicaid Other | Admitting: Family Medicine

## 2022-10-09 ENCOUNTER — Other Ambulatory Visit: Payer: Medicaid Other | Admitting: *Deleted
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
## 2022-10-09 NOTE — Patient Instructions (Signed)
Visit Information  Ms. Rachel Lloyd was given information about Medicaid Managed Care team care coordination services as a part of their Healthy [facility] Medicaid benefit. Rachel Lloyd verbally consented to engagement with the [facility] Managed Care team.   If you are experiencing a medical emergency, please call 911 or report to your local emergency department or urgent care.   If you have a non-emergency medical problem during routine business hours, please contact your provider's office and ask to speak with a nurse.   For questions related to your Healthy [facility] health plan, please call: [phone] or visit the homepage here: [email or web address]  If you would like to schedule transportation through your Healthy [facility] plan, please call the following number at least 2 days in advance of your appointment: [phone]  For information about your ride after you set it up, call Ride Assist at [phone]. Use this number to activate a Will Call pickup, or if your transportation is late for a scheduled pickup. Use this number, too, if you need to make a change or cancel a previously scheduled reservation.  If you need transportation services right away, call [phone]. The after-hours call center is staffed 24 hours to handle ride assistance and urgent reservation requests (including discharges) 365 days a year. Urgent trips include sick visits, hospital discharge requests and life-sustaining treatment.  Call the [facility] Line at [phone], at any time, 24 hours a day, 7 days a week. If you are in danger or need immediate medical attention call 911.  If you would like help to quit smoking, call 1-800-QUIT-NOW ((212)663-0316) OR Espaol: 1-855-Djelo-Ya (4-742-595-6387) o para ms informacin haga clic aqu or Text READY to 564-332 to register via text  Rachel Lloyd,   Please see education materials related to Bacterial Vaginosis and  Contraception provided by MyChart link.  Patient verbalizes understanding of instructions and care plan provided today and agrees to view in MyChart. Active MyChart status and patient understanding of how to access instructions and care plan via MyChart confirmed with patient.     Telephone follow up appointment with Managed Medicaid care management team member scheduled for:12/09/22 at 10:30am  [name] RN, BSN [place]  Value-Based Care Institute [facility] Health RN Care Coordinator [phone]   Following is a copy of your plan of care:  Care Plan : RN Care Manager Plan of Care  Updates made by [name], RN since 10/09/2022 12:00 AM     Problem: Health Management needs related to GYN health      Long-Range Goal: Development of Plan of Care to address Health Management needs related to GYN health   Start Date: 10/09/2022  Expected End Date: 01/07/2023  Note:   Current Barriers:  Knowledge Deficits related to plan of care for management of GYN Health   RNCM Clinical Goal(s):  Patient will continue to work with RN Care Manager to address care management and care coordination needs related to  GYN Health as evidenced by adherence to CM Team Scheduled appointments work with Child psychotherapist to address  related to the management of Financial constraints related to affording utilities related to the management of GYN Health as evidenced by review of EMR and patient or Child psychotherapist report through collaboration with Medical illustrator, provider, and care team.   Interventions: Evaluation of current treatment plan related to  self management and patient's adherence to plan as established by provider   GYN Health  (Status:  New goal.)  Long Term Goal  Evaluation of current treatment plan related to  GYN Health , Financial constraints related to difficulty affording utilities  self-management and patient's adherence to plan as established by provider. Discussed plans with  patient for ongoing care management follow up and provided patient with direct contact information for care management team Provided education to patient re: Contraceptives and Bacterial Vaginosis Reviewed medications with patient and discussed oral contraceptive Social Work referral for assistance with utilities Discussed plans with patient for ongoing care management follow up and provided patient with direct contact information for care management team Assessed social determinant of health barriers Advised patient to discuss any concerns or questions with GYN provider Provided therapeutic listening Discussed referral to LCSW for assistance managing mental health-patient declined Advised patient to contact [employer] [phone] for member benefits  Patient Goals/Self-Care Activities: Call provider office for new concerns or questions  Work with the social worker to address care coordination needs and will continue to work with the clinical team to address health care and disease management related needs call 1-800-273-TALK (toll free, 24 hour hotline) go to [facility] [address], [place] [phone]) call 911 if experiencing a Mental Health or Behavioral Health Crisis   Follow Up Plan:  Telephone follow up appointment with care management team member scheduled for:  12/09/22 at 10:30am

## 2022-10-14 ENCOUNTER — Other Ambulatory Visit: Payer: Medicaid Other
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
## 2022-10-14 NOTE — Patient Instructions (Signed)
Visit Information  Ms. Rachel Lloyd was given information about Medicaid Managed Care team care coordination services as a part of their Healthy [facility] Medicaid benefit. [name] verbally consented to engagement with the [facility] Managed Care team.   If you are experiencing a medical emergency, please call 911 or report to your local emergency department or urgent care.   If you have a non-emergency medical problem during routine business hours, please contact your provider's office and ask to speak with a nurse.   For questions related to your Healthy [facility] health plan, please call: [phone] or visit the homepage here: [email or web address]  If you would like to schedule transportation through your Healthy [facility] plan, please call the following number at least 2 days in advance of your appointment: [phone]  For information about your ride after you set it up, call Ride Assist at [phone]. Use this number to activate a Will Call pickup, or if your transportation is late for a scheduled pickup. Use this number, too, if you need to make a change or cancel a previously scheduled reservation.  If you need transportation services right away, call [phone]. The after-hours call center is staffed 24 hours to handle ride assistance and urgent reservation requests (including discharges) 365 days a year. Urgent trips include sick visits, hospital discharge requests and life-sustaining treatment.  Call the [facility] Line at [phone], at any time, 24 hours a day, 7 days a week. If you are in danger or need immediate medical attention call 911.  If you would like help to quit smoking, call 1-800-QUIT-NOW (660-173-3063) OR Espaol: 1-855-Djelo-Ya (3-557-322-0254) o para ms informacin haga clic aqu or Text READY to 270-623 to register via text  Ms. Rachel Lloyd - following are the goals we discussed in your visit today:   Goals  Addressed   None      Social Worker will follow up in 30 days.   Gus Puma, Kenard Gower, MHA [facility] Health  Managed Medicaid Social Worker [phone]   Following is a copy of your plan of care:  There are no care plans that you recently modified to display for this patient.

## 2022-11-05 ENCOUNTER — Ambulatory Visit
Admission: RE | Admit: 2022-11-05 | Discharge: 2022-11-05 | Disposition: A | Discharge status: Home / Self Care | Payer: Medicaid Other | Source: Ambulatory Visit | Attending: Internal Medicine | Admitting: Internal Medicine

## 2022-11-05 VITALS — BP 117/82 | HR 81 | Temp 98.4°F | Resp 16

## 2022-11-05 DIAGNOSIS — N760 Acute vaginitis: Secondary | ICD-10-CM | POA: Insufficient documentation

## 2022-11-05 DIAGNOSIS — R319 Hematuria, unspecified: Secondary | ICD-10-CM | POA: Diagnosis not present

## 2022-11-05 LAB — POCT URINALYSIS DIP (MANUAL ENTRY)
Bilirubin, UA: NEGATIVE
Glucose, UA: NEGATIVE mg/dL
Ketones, POC UA: NEGATIVE mg/dL
Leukocytes, UA: NEGATIVE
Nitrite, UA: NEGATIVE
Protein Ur, POC: NEGATIVE mg/dL
Spec Grav, UA: 1.025 (ref 1.010–1.025)
Urobilinogen, UA: 0.2 U/dL
pH, UA: 7 (ref 5.0–8.0)

## 2022-11-05 LAB — POCT URINE PREGNANCY: Preg Test, Ur: NEGATIVE

## 2022-11-05 MED ORDER — FLUCONAZOLE 150 MG PO TABS: 150.0000 mg | ORAL_TABLET | Freq: Every day | ORAL | 0 refills | Status: AC

## 2022-11-05 MED ORDER — METRONIDAZOLE 0.75 % VA GEL: 1.0000 | Freq: Every day | VAGINAL | 0 refills | Status: AC

## 2022-11-05 NOTE — Discharge Instructions (Addendum)
Start Diflucan and MetroGel gel as prescribed.  The clinical contact you with results of the testing done today if positive.  Please follow-up with your PCP or gynecologist if symptoms do not improve.  Please go to the ER for any worsening symptoms.  I hope you feel better soon!

## 2022-11-05 NOTE — ED Triage Notes (Signed)
Pt presents to UC w/ c/o vaginal itching x1 week. "strange odor to urine" for a day. Lower abdominal pain x2 days.
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
## 2022-11-06 LAB — URINE CULTURE

## 2022-11-10 LAB — CERVICOVAGINAL ANCILLARY ONLY
Bacterial Vaginitis (gardnerella): POSITIVE — AB
Candida Glabrata: NEGATIVE
Candida Vaginitis: NEGATIVE
Chlamydia: NEGATIVE
Comment: NEGATIVE
Comment: NEGATIVE
Comment: NEGATIVE
Comment: NEGATIVE
Comment: NEGATIVE
Comment: NORMAL
Neisseria Gonorrhea: NEGATIVE
Trichomonas: NEGATIVE

## 2022-11-14 ENCOUNTER — Other Ambulatory Visit: Payer: Self-pay
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
## 2022-11-14 NOTE — Patient Instructions (Signed)
Visit Information  Ms. Rachel Lloyd was given information about Medicaid Managed Care team care coordination services as a part of their Healthy [facility] Medicaid benefit. Rachel Lloyd verbally consented to engagement with the [facility] Managed Care team.   If you are experiencing a medical emergency, please call 911 or report to your local emergency department or urgent care.   If you have a non-emergency medical problem during routine business hours, please contact your provider's office and ask to speak with a nurse.   For questions related to your Healthy [facility] health plan, please call: [phone] or visit the homepage here: [email or web address]  If you would like to schedule transportation through your Healthy [facility] plan, please call the following number at least 2 days in advance of your appointment: [phone]  For information about your ride after you set it up, call Ride Assist at [phone]. Use this number to activate a Will Call pickup, or if your transportation is late for a scheduled pickup. Use this number, too, if you need to make a change or cancel a previously scheduled reservation.  If you need transportation services right away, call [phone]. The after-hours call center is staffed 24 hours to handle ride assistance and urgent reservation requests (including discharges) 365 days a year. Urgent trips include sick visits, hospital discharge requests and life-sustaining treatment.  Call the [facility] Line at [phone], at any time, 24 hours a day, 7 days a week. If you are in danger or need immediate medical attention call 911.  If you would like help to quit smoking, call 1-800-QUIT-NOW ((587)257-8593) OR Espaol: 1-855-Djelo-Ya (9-518-841-6606) o para ms informacin haga clic aqu or Text READY to 301-601 to register via text  Rachel Lloyd - following are the goals we discussed in your visit today:   Goals  Addressed   None       The  Patient                                              has been provided with contact information for the Managed Medicaid care management team and has been advised to call with any health related questions or concerns.  Gus Puma, Kenard Gower, MHA [facility]  Managed Medicaid Social Worker [phone]   Following is a copy of your plan of care:  There are no care plans that you recently modified to display for this patient.

## 2022-12-09 ENCOUNTER — Other Ambulatory Visit: Payer: Self-pay | Admitting: *Deleted

## 2022-12-09 NOTE — Patient Outreach (Signed)
Care Coordination  12/09/2022  Rachel Lloyd 1985/04/13 [id number]   Successful telephone outreach with [name] today. However, due to a death in the family, she request to reschedule this telephone appointment. A new telephone outreach was scheduled for 12/23/22 at 11:15am.  [name] RN, BSN [place]  [facility] RN Care Coordinator [phone]

## 2022-12-23 ENCOUNTER — Other Ambulatory Visit: Payer: Self-pay | Admitting: *Deleted

## 2022-12-23 NOTE — Patient Instructions (Signed)
Visit Information  [name] was given information about Medicaid Managed Care team care coordination services as a part of their Healthy [facility] Medicaid benefit. Rachel Lloyd verbally consented to engagement with the [facility] Managed Care team.   If you are experiencing a medical emergency, please call 911 or report to your local emergency department or urgent care.   If you have a non-emergency medical problem during routine business hours, please contact your provider's office and ask to speak with a nurse.   For questions related to your Healthy [facility] health plan, please call: [phone] or visit the homepage here: [email or web address]  If you would like to schedule transportation through your Healthy [facility] plan, please call the following number at least 2 days in advance of your appointment: [phone]  For information about your ride after you set it up, call Ride Assist at [phone]. Use this number to activate a Will Call pickup, or if your transportation is late for a scheduled pickup. Use this number, too, if you need to make a change or cancel a previously scheduled reservation.  If you need transportation services right away, call [phone]. The after-hours call center is staffed 24 hours to handle ride assistance and urgent reservation requests (including discharges) 365 days a year. Urgent trips include sick visits, hospital discharge requests and life-sustaining treatment.  Call the [facility] Line at [phone], at any time, 24 hours a day, 7 days a week. If you are in danger or need immediate medical attention call 911.  If you would like help to quit smoking, call 1-800-QUIT-NOW (216-514-0403) OR Espaol: 1-855-Djelo-Ya (7-564-332-9518) o para ms informacin haga clic aqu or Text READY to 841-660 to register via text  Rachel Lloyd,   Please see education materials related to Oral Contraception  provided by MyChart link.  Patient verbalizes understanding of instructions and care plan provided today and agrees to view in MyChart. Active MyChart status and patient understanding of how to access instructions and care plan via MyChart confirmed with patient.     Telephone follow up appointment with Managed Medicaid care management team member scheduled for:01/26/23 at 9am  [name] RN, BSN [place]  [facility] RN Care Coordinator [phone]   Following is a copy of your plan of care:  Care Plan : RN Care Manager Plan of Care  Updates made by [name], RN since 12/23/2022 12:00 AM     Problem: Health Management needs related to GYN health      Long-Range Goal: Development of Plan of Care to address Health Management needs related to GYN health   Start Date: 10/09/2022  Expected End Date: 01/07/2023  Note:   Current Barriers:  Knowledge Deficits related to plan of care for management of GYN Health   RNCM Clinical Goal(s):  Patient will continue to work with RN Care Manager to address care management and care coordination needs related to  GYN Health as evidenced by adherence to CM Team Scheduled appointments work with Child psychotherapist to address  related to the management of Financial constraints related to affording utilities related to the management of GYN Health as evidenced by review of EMR and patient or Child psychotherapist report through collaboration with Medical illustrator, provider, and care team.   Interventions: Evaluation of current treatment plan related to  self management and patient's adherence to plan as established by provider   GYN Health  (Status:  Goal on track:  Yes.)  Long Term  Goal Evaluation of current treatment plan related to  GYN Health , Financial constraints related to difficulty affording utilities  self-management and patient's adherence to plan as established by provider. Discussed plans with patient for  ongoing care management follow up and provided patient with direct contact information for care management team Provided education to patient re: Oral Contraceptive Reviewed medications with patient and discussed the importance of taking an oral contraceptive at the same time each day Discussed plans with patient for ongoing care management follow up and provided patient with direct contact information for care management team Assessed social determinant of health barriers Advised patient to discuss any concerns or questions with GYN provider Provided therapeutic listening-patient recently experienced the death of her best friend and Grandmother Discussed referral to LCSW for assistance managing mental health-patient accepted, scheduled on 01/01/23 Advised patient to follow up with [name] [phone] for update on member benefits Collaborated with BSW, patient requesting a follow up visit-scheduled on 12/25/22  Patient Goals/Self-Care Activities: Call provider office for new concerns or questions  Work with the social worker to address care coordination needs and will continue to work with the clinical team to address health care and disease management related needs call 1-800-273-TALK (toll free, 24 hour hotline) go to [facility] [address] [place] [phone]) call 911 if experiencing a Mental Health or Behavioral Health Crisis   Follow Up Plan:  Telephone follow up appointment with care management team member scheduled for:  01/26/23 at 9am
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
## 2022-12-25 ENCOUNTER — Other Ambulatory Visit: Payer: Self-pay
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
## 2022-12-25 NOTE — Patient Instructions (Signed)
 Visit Information  Ms. Rachel Lloyd was given information about Medicaid Managed Care team care coordination services as a part of their Healthy [facility] Medicaid benefit. Rachel Lloyd verbally consented to engagement with the [facility] Managed Care team.   If you are experiencing a medical emergency, please call 911 or report to your local emergency department or urgent care.   If you have a non-emergency medical problem during routine business hours, please contact your provider's office and ask to speak with a nurse.   For questions related to your Healthy [facility] health plan, please call: [phone] or visit the homepage here: [email or web address]  If you would like to schedule transportation through your [facility] plan, please call the following number at least 2 days in advance of your appointment: [phone]  For information about your ride after you set it up, call Ride Assist at [phone]. Use this number to activate a Will Call pickup, or if your transportation is late for a scheduled pickup. Use this number, too, if you need to make a change or cancel a previously scheduled reservation.  If you need transportation services right away, call [phone]. The after-hours call center is staffed 24 hours to handle ride assistance and urgent reservation requests (including discharges) 365 days a year. Urgent trips include sick visits, hospital discharge requests and life-sustaining treatment.  Call the [facility] Line at [phone], at any time, 24 hours a day, 7 days a week. If you are in danger or need immediate medical attention call 911.  If you would like help to quit smoking, call 1-800-QUIT-NOW ((587)257-8593) OR Espaol: 1-855-Djelo-Ya (9-518-841-6606) o para ms informacin haga clic aqu or Text READY to 301-601 to register via text  Rachel Lloyd - following are the goals we discussed in your visit today:   Goals  Addressed   None       The  Patient                                              has been provided with contact information for the Managed Medicaid care management team and has been advised to call with any health related questions or concerns.  Rachel Lloyd, Rachel Lloyd, MHA [facility]  Managed Medicaid Social Worker [phone]   Following is a copy of your plan of care:  There are no care plans that you recently modified to display for this patient.

## 2023-01-01 ENCOUNTER — Other Ambulatory Visit: Payer: Self-pay | Admitting: Licensed Clinical Social Worker
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
## 2023-01-01 NOTE — Patient Instructions (Signed)
Visit Information  Ms. Bothun was given information about Medicaid Managed Care team care coordination services as a part of their Healthy [facility] Medicaid benefit. [name] verbally consented to engagement with the [facility] Managed Care team.   If you are experiencing a medical emergency, please call 911 or report to your local emergency department or urgent care.   If you have a non-emergency medical problem during routine business hours, please contact your provider's office and ask to speak with a nurse.   For questions related to your Healthy [facility] health plan, please call: [phone] or visit the homepage here: [email or web address]  If you would like to schedule transportation through your Healthy [facility] plan, please call the following number at least 2 days in advance of your appointment: [phone]  For information about your ride after you set it up, call Ride Assist at [phone]. Use this number to activate a Will Call pickup, or if your transportation is late for a scheduled pickup. Use this number, too, if you need to make a change or cancel a previously scheduled reservation.  If you need transportation services right away, call [phone]. The after-hours call center is staffed 24 hours to handle ride assistance and urgent reservation requests (including discharges) 365 days a year. Urgent trips include sick visits, hospital discharge requests and life-sustaining treatment.  Call the [facility] Line at [phone], at any time, 24 hours a day, 7 days a week. If you are in danger or need immediate medical attention call 911.  If you would like help to quit smoking, call 1-800-QUIT-NOW (5732721973) OR Espaol: 1-855-Djelo-Ya (8-756-433-2951) o para ms informacin haga clic aqu or Text READY to 884-166 to register via text   Following is a copy of your plan of care:  Care Plan : LCSW Plan of Care   Updates made by Gustavus Bryant, LCSW since 01/01/2023 12:00 AM     Problem: Anxiety Identification (Anxiety)      Goal: Anxiety Symptoms Identified   Start Date: 01/01/2023  Note:    Timeframe:  Long-Range Goal Priority:  High Start Date:   01/01/23             Expected End Date:  ongoing                     Follow Up Date--01/27/23 at 9 am  - check out bereavement counseling and long term counseling options -resources sent to you by email  - keep 90 percent of counseling appointments - schedule initial counseling appointment    Why is this important?             Beating grief and depression may take some time.            If you don't feel better right away, don't give up on your treatment plan.    Current barriers:   Behavioral Health needs related to bipolar dx and symptoms of depression, anxiety and grief from losing her grandmother and best friend. Patient requires Support, Education, Resources, Referrals, Advocacy, and Care Coordination, in order to meet Unmet Mental Health Needs. Patient will implement clinical interventions discussed today to decrease symptoms of grief and increase knowledge and/or ability of: coping skills. Mental Health Concerns and Social Isolation Patient lacks knowledge of available community counseling agencies and resources.  Clinical Goal(s): verbalize understanding of plan for management of Grief, Depression, and Stress and demonstrate a reduction in symptoms. Patient will connect with a provider for ongoing  mental health treatment, increase coping skills, healthy habits, self-management skills, and stress reduction        Patient Goals/Self-Care Activities: Over the next 120 days Attend scheduled medical appointments Utilize healthy coping skills and supportive resources discussed Contact PCP with any questions or concerns Keep 90 percent of counseling appointments Call your insurance provider for more information about your Enhanced Benefits   Check out counseling resources provided  Begin personal counseling with LCSW, to reduce and manage symptoms of Depression and Stress, until well-established with mental health provider Accept all calls from representatives at [facility]) as an effort to establish ongoing mental health counseling and supportive grief services.  Incorporate into daily practice - relaxation techniques, deep breathing exercises, and mindfulness meditation strategies. Talk about feelings with friends, family members, spiritual advisor, etc. Contact LCSW directly [phone]), if you have questions, need assistance, or if additional social work needs are identified between now and our next scheduled telephone outreach call. Call 988 for mental health hotline/crisis line if needed (24/7 available) Try techniques to reduce symptoms of anxiety/negative thinking (deep breathing, distraction, positive self talk, etc)  - develop a personal safety plan - develop a plan to deal with triggers like holidays, anniversaries - exercise at least 2 to 3 times per week - have a plan for how to handle bad days - journal feelings and what helps to feel better or worse - spend time or talk with others at least 2 to 3 times per week - watch for early signs of feeling worse - begin personal counseling - call and visit an old friend - check out volunteer opportunities - join a support group - laugh; watch a funny movie or comedian - learn and use visualization or guided imagery - perform a random act of kindness - practice relaxation or meditation daily - start or continue a personal journal - practice positive thinking and self-talk -continue with compliance of taking medication  -identify current effective and ineffective coping strategies.  -implement positive self-talk in care to increase self-esteem, confidence and feelings of control.  -consider alternative and complementary therapy approaches such as meditation,  mindfulness or yoga.  -journaling, prayer, worship services, meditation or pastoral counseling.  -increase participation in pleasurable group activities such as hobbies, singing, sports or volunteering).  -consider the use of meditative movement therapy such as tai chi, yoga or qigong.  -start a regular daily exercise program based on tolerance, ability and patient choice to support positive thinking and activity     Managing Loss, Adult People experience loss in many different ways throughout their lives. Events such as moving, changing jobs, and losing friends can create a sense of loss. The loss may be as serious as a major health change, divorce, death of a pet, or death of a loved one. All of these types of loss are likely to create a physical and emotional reaction known as grief. Grief is the result of a major change or an absence of something or someone that you count on. Grief is a normal reaction to loss. A variety of factors can affect your grieving experience, including: The nature of your loss. Your relationship to what or whom you lost. Your understanding of grief and how to manage it. Your support system. How to manage lifestyle changes Keep to your normal routine as much as possible. If you have trouble focusing or doing normal activities, it is acceptable to take some time away from your normal routine. Spend time with friends and  loved ones. Eat a healthy diet, get plenty of sleep, and rest when you feel tired. How to recognize changes  The way that you deal with your grief will affect your ability to function as you normally do. When grieving, you may experience these changes: Numbness, shock, sadness, anxiety, anger, denial, and guilt. Thoughts about death. Unexpected crying. A physical sensation of emptiness in your stomach. Problems sleeping and eating. Tiredness (fatigue). Loss of interest in normal activities. Dreaming about or imagining seeing the person who  died. A need to remember what or whom you lost. Difficulty thinking about anything other than your loss for a period of time. Relief. If you have been expecting the loss for a while, you may feel a sense of relief when it happens. Follow these instructions at home:    Activity Express your feelings in healthy ways, such as: Talking with others about your loss. It may be helpful to find others who have had a similar loss, such as a support group. Writing down your feelings in a journal. Doing physical activities to release stress and emotional energy. Doing creative activities like painting, sculpting, or playing or listening to music. Practicing resilience. This is the ability to recover and adjust after facing challenges. Reading some resources that encourage resilience may help you to learn ways to practice those behaviors. General instructions Be patient with yourself and others. Allow the grieving process to happen, and remember that grieving takes time. It is likely that you may never feel completely done with some grief. You may find a way to move on while still cherishing memories and feelings about your loss. Accepting your loss is a process. It can take months or longer to adjust. Keep all follow-up visits as told by your health care provider. This is important. Where to find support To get support for managing loss: Ask your health care provider for help and recommendations, such as grief counseling or therapy. Think about joining a support group for people who are managing a loss. Where to find more information You can find more information about managing loss from: American Society of Clinical Oncology: www.cancer.net American Psychological Association: DiceTournament.ca Contact a health care provider if: Your grief is extreme and keeps getting worse. You have ongoing grief that does not improve. Your body shows symptoms of grief, such as illness. You feel depressed, anxious, or  lonely. Get help right away if: You have thoughts about hurting yourself or others. If you ever feel like you may hurt yourself or others, or have thoughts about taking your own life, get help right away. You can go to your nearest emergency department or call: Your local emergency services (911 in the [place] A suicide crisis helpline, such as the National Suicide Prevention Lifeline at 602-406-6182. This is open 24 hours a day. Summary Grief is the result of a major change or an absence of someone or something that you count on. Grief is a normal reaction to loss. The depth of grief and the period of recovery depend on the type of loss and your ability to adjust to the change and process your feelings. Processing grief requires patience and a willingness to accept your feelings and talk about your loss with people who are supportive. It is important to find resources that work for you and to realize that people experience grief differently. There is not one grieving process that works for everyone in the same way. Be aware that when grief becomes extreme, it can lead to  more severe issues like isolation, depression, anxiety, or suicidal thoughts. Talk with your health care provider if you have any of these issues. This information is not intended to replace advice given to you by your health care provider. Make sure you discuss any questions you have with your health care provider. Document Revised: 03/05/2018 Document Reviewed: 05/15/2016 Elsevier Patient Education  2020 [employer].  Help for Managing Grief   When you are experiencing grief, many resources and support are available to help you. You are not alone.    Grief Share (https://www.[email or web address].gl):    [employer] of [address] [place], [place]      [facility]  [phone] [address] [place], [place]     [facility] [facility] [address] [place], [place]      [facility] [address] [place], [place]  [phone]   [facility] [address] [place], [place] [phone]   [facility] [address] [place], [place]     [phone]   [facility] [address] [place], [place]     [phone]   If a Hospice or Palliative Care team has been involved in the care of your loved one, you may reach out to your contact there or locally, you may reach out to [facility]:    [facility] [place] [facility]   [address] [place], [place] [address] [phone]   [facility]  [facility] in [place], [place]  [address], [place], [place] [address] Phone: [phone]     24- Hour Availability:    [facility]  [address] [place], [place] [phone] Crisis [phone]   [facility] [phone]  [facility]  [phone]    [facility]  [phone] (after hours)   Therapeutic Alternative/Mobile Crisis   [phone]   [place] National Suicide Hotline  9708586107 Len Childs) [place] 993   Call 53 for mental health emergencies   [facility]  [phone]);  [place] and [employer]  [phone]); [place], [place], [place], [place], [place], [place], [place]    [facility] Residents For 24/7 walk-up access to mental health services for [facility] children (4+), adolescents and adults, please visit the [facility] located at [address] in [place], [place].  *[place] also provides comprehensive outpatient behavioral health services in a variety of locations around the [place].  Connect With [place] [address] [place], [place] [address] HelpLine: 971-478-5769 or 1-530-325-4330  Get  Directions  Find Help 24/7 By Phone Call our 24-hour HelpLine at (208)384-6343 or (586)013-8037 for immediate assistance for mental health and substance abuse issues.  Walk-In Help [place]: [facility] (Ages 4 and Up) [place]: Emergency Dept., [facility] Additional Resources National Hopeline Network: 1-800-SUICIDE The National Suicide Prevention Lifeline: 1-800-273-TALK     The following coping skill education was provided for stress relief and mental health management: "When your car dies or a deadline looms, how do you respond? Long-term, low-grade or acute stress takes a serious toll on your body and mind, so don't ignore feelings of constant tension. Stress is a natural part of life. However, too much stress can harm our health, especially if it  continues every day. This is chronic stress and can put you at risk for heart problems like heart disease and depression. Understand what's happening inside your body and learn simple coping skills to combat the negative impacts of everyday stressors.  Types of Stress There are two types of stress: Emotional - types of emotional stress are relationship problems, pressure at work, financial worries, experiencing discrimination or having a major life change. Physical - Examples of physical stress include being sick having pain, not sleeping well, recovery from an injury or having an alcohol and drug use disorder. Fight or Flight Sudden or ongoing stress activates your nervous system and floods your bloodstream with adrenaline and cortisol, two hormones that raise blood pressure, increase heart rate and spike blood sugar. These changes pitch your body into a fight or flight response. That enabled our ancestors to outrun saber-toothed tigers, and it's helpful today for situations like dodging a car accident. But most modern chronic stressors, such as finances or a challenging  relationship, keep your body in that heightened state, which hurts your health. Effects of Too Much Stress If constantly under stress, most of [place] will eventually start to function less well.  Multiple studies link chronic stress to a higher risk of heart disease, stroke, depression, weight gain, memory loss and even premature death, so it's important to recognize the warning signals. Talk to your doctor about ways to manage stress if you're experiencing any of these symptoms: Prolonged periods of poor sleep. Regular, severe headaches. Unexplained weight loss or gain. Feelings of isolation, withdrawal or worthlessness. Constant anger and irritability. Loss of interest in activities. Constant worrying or obsessive thinking. Excessive alcohol or drug use. Inability to concentrate.  10 Ways to Cope with Chronic Stress It's key to recognize stressful situations as they occur because it allows you to focus on managing how you react. We all need to know when to close our eyes and take a deep breath when we feel tension rising. Use these tips to prevent or reduce chronic stress. 1. Rebalance Work and Home All work and no play? If you're spending too much time at the office, intentionally put more dates in your calendar to enjoy time for fun, either alone or with others. 2. Get Regular Exercise Moving your body on a regular basis balances the nervous system and increases blood circulation, helping to flush out stress hormones. Even a daily 20-minute walk makes a difference. Any kind of exercise can lower stress and improve your mood ? just pick activities that you enjoy and make it a regular habit. 3. Eat Well and Limit Alcohol and Stimulants Alcohol, nicotine and caffeine may temporarily relieve stress but have negative health impacts and can make stress worse in the long run. Well-nourished bodies cope better, so start with a good breakfast, add more organic fruits and vegetables for a well-balanced  diet, avoid processed foods and sugar, try herbal tea and drink more water. 4. Connect with Supportive People Talking face to face with another person releases hormones that reduce stress. Lean on those good listeners in your life. 5. Carve Out Hobby Time Do you enjoy gardening, reading, listening to music or some other creative pursuit? Engage in activities that bring you pleasure and joy; research shows that reduces stress by almost half and lowers your heart rate, too. 6. Practice Meditation, Stress Reduction or Yoga Relaxation techniques activate a state of restfulness that counterbalances your body's fight-or-flight hormones. Even if this also means a 10-minute break in a  long day: listen to music, read, go for a walk in nature, do a hobby, take a bath or spend time with a friend. Also consider doing a mindfulness exercise or try a daily deep breathing or imagery practice. Deep Breathing Slow, calm and deep breathing can help you relax. Try these steps to focus on your breathing and repeat as needed. Find a comfortable position and close your eyes. Exhale and drop your shoulders. Breathe in through your nose; fill your lungs and then your belly. Think of relaxing your body, quieting your mind and becoming calm and peaceful. Breathe out slowly through your nose, relaxing your belly. Think of releasing tension, pain, worries or distress. Repeat steps three and four until you feel relaxed. Imagery This involves using your mind to excite the senses -- sound, vision, smell, taste and feeling. This may help ease your stress. Begin by getting comfortable and then do some slow breathing. Imagine a place you love being at. It could be somewhere from your childhood, somewhere you vacationed or just a place in your imagination. Feel how it is to be in the place you're imagining. Pay attention to the sounds, air, colors, and who is there with you. This is a place where you feel cared for and loved. All is  well. You are safe. Take in all the smells, sounds, tastes and feelings. As you do, feel your body being nourished and healed. Feel the calm that surrounds you. Breathe in all the good. Breathe out any discomfort or tension. 7. Sleep Enough If you get less than seven to eight hours of sleep, your body won't tolerate stress as well as it could. If stress keeps you up at night, address the cause, and add extra meditation into your day to make up for the lost z's. Try to get seven to nine hours of sleep each night. Make a regular bedtime schedule. Keep your room dark and cool. Try to avoid computers, TV, cell phones and tablets before bed. 8. Bond with Connections You Enjoy Go out for a coffee with a friend, chat with a neighbor, call a family member, visit with a clergy member, or even hang out with your pet. Clinical studies show that spending even a short time with a companion animal can cut anxiety levels almost in half. 9. Take a Vacation Getting away from it all can reset your stress tolerance by increasing your mental and emotional outlook, which makes you a happier, more productive person upon return. Leave your cellphone and laptop at home! 10. See a Counselor, Coach or Therapist If negative thoughts overwhelm your ability to make positive changes, it's time to seek professional help. Make an appointment today--your health and life are worth it."  Dickie La, BSW, MSW, LCSW Licensed Clinical Social Worker [employer]   [facility] [email or web address] Direct Dial: [phone]

## 2023-01-13 ENCOUNTER — Telehealth: Payer: Self-pay

## 2023-01-13 ENCOUNTER — Ambulatory Visit
Admission: EM | Admit: 2023-01-13 | Discharge: 2023-01-13 | Disposition: A | Discharge status: Home / Self Care | Payer: Medicaid Other | Attending: Family Medicine | Admitting: Family Medicine

## 2023-01-13 DIAGNOSIS — N898 Other specified noninflammatory disorders of vagina: Secondary | ICD-10-CM

## 2023-01-13 DIAGNOSIS — N390 Urinary tract infection, site not specified: Secondary | ICD-10-CM | POA: Diagnosis not present

## 2023-01-13 DIAGNOSIS — F4321 Adjustment disorder with depressed mood: Secondary | ICD-10-CM | POA: Diagnosis not present

## 2023-01-13 DIAGNOSIS — R300 Dysuria: Secondary | ICD-10-CM | POA: Diagnosis not present

## 2023-01-13 LAB — POCT URINE PREGNANCY: Preg Test, Ur: NEGATIVE

## 2023-01-13 LAB — POCT URINALYSIS DIP (MANUAL ENTRY)
Bilirubin, UA: NEGATIVE
Glucose, UA: NEGATIVE mg/dL
Ketones, POC UA: NEGATIVE mg/dL
Leukocytes, UA: NEGATIVE
Nitrite, UA: POSITIVE — AB
Protein Ur, POC: NEGATIVE mg/dL
Spec Grav, UA: 1.025 (ref 1.010–1.025)
Urobilinogen, UA: 0.2 U/dL
pH, UA: 5.5 (ref 5.0–8.0)

## 2023-01-13 MED ORDER — NITROFURANTOIN MONOHYD MACRO 100 MG PO CAPS: 100.0000 mg | ORAL_CAPSULE | Freq: Two times a day (BID) | ORAL | 0 refills | Status: DC

## 2023-01-13 MED ORDER — HYDROXYZINE HCL 25 MG PO TABS: 25.0000 mg | ORAL_TABLET | Freq: Four times a day (QID) | ORAL | 0 refills | Status: DC

## 2023-01-13 NOTE — Discharge Instructions (Signed)
 Make sure you are drinking lots of water It appears you have a bladder infection.  I am prescribing Macrobid  to take 2 times a day for 5 days Your swabs have been sent to the laboratory for additional testing.  You will be called if any change in therapy is needed  I have prescribed hydroxyzine  to take to help relax you.  It is important that you follow-up with your primary care doctor.  If you need help urgently go to the behavioral health urgent care
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
## 2023-01-13 NOTE — ED Triage Notes (Signed)
Pt c/o abd pain and some vaginal discharge. Pt is a month late on period as well. Would like pregnancy test as well as STD testing added to swab.

## 2023-01-26 ENCOUNTER — Other Ambulatory Visit: Payer: Self-pay | Admitting: *Deleted

## 2023-01-26 NOTE — Patient Instructions (Signed)
Thank you for taking time to speak with me today about care coordination and care management services available to you at no cost as part of your Medicaid benefit. These services are voluntary. Our team is available to provide assistance regarding your health care needs at any time. Please do not hesitate to reach out to me if we can be of service to you at any time in the future.   [name] RN, BSN [place]  [facility] RN Care Coordinator [phone]

## 2023-01-26 NOTE — Patient Outreach (Signed)
 Care Management/Care Coordination  RN Case Manager Case Closure Note  01/26/2023 Name: Rachel Lloyd MRN: [id number] DOB: 02/11/1985  Rachel Lloyd is a 38 y.o. year old female who is a primary care patient of Sowell, Penne, MD. The care management/care coordination team was consulted for assistance with chronic disease management and/or care coordination needs.   Care Plan : RN Care Manager Plan of Care  Updates made by Lucky Andrea LABOR, RN since 01/26/2023 12:00 AM  Completed 01/26/2023   Problem: Health Management needs related to GYN health Resolved 01/26/2023     Long-Range Goal: Development of Plan of Care to address Health Management needs related to GYN health Completed 01/26/2023  Start Date: 10/09/2022  Expected End Date: 01/07/2023  Note:   Current Barriers:  Knowledge Deficits related to plan of care for management of GYN Health  Patient declined to continue CM services  RNCM Clinical Goal(s):  Patient will continue to work with RN Care Manager to address care management and care coordination needs related to  GYN Health as evidenced by adherence to CM Team Scheduled appointments work with child psychotherapist to address  related to the management of Financial constraints related to affording utilities related to the management of GYN Health as evidenced by review of EMR and patient or child psychotherapist report through collaboration with Medical Illustrator, provider, and care team.   Interventions: Evaluation of current treatment plan related to  self management and patient's adherence to plan as established by provider Collaborate with BSW and LCSW regarding discontinuation of CM services   GYN Health  (Status:  Patient declined further engagement on this goal.)  Long Term Goal Evaluation of current treatment plan related to  GYN Health , Financial constraints related to difficulty affording utilities  self-management and patient's adherence to plan as established by  provider. Discussed plans with patient for ongoing care management follow up and provided patient with direct contact information for care management team Provided education to patient re: Oral Contraceptive Reviewed medications with patient and discussed the importance of taking an oral contraceptive at the same time each day Discussed plans with patient for ongoing care management follow up and provided patient with direct contact information for care management team Assessed social determinant of health barriers Advised patient to discuss any concerns or questions with GYN provider Provided therapeutic listening-patient recently experienced the death of her best friend and Grandmother Discussed referral to LCSW for assistance managing mental health-patient accepted, scheduled on 01/01/23 Advised patient to follow up with [name] [phone] for update on member benefits Collaborated with BSW, patient requesting a follow up visit-scheduled on 12/25/22  Patient Goals/Self-Care Activities: Call provider office for new concerns or questions  Work with the social worker to address care coordination needs and will continue to work with the clinical team to address health care and disease management related needs call 1-800-273-TALK (toll free, 24 hour hotline) go to [facility] [address] [place] [phone]) call 911 if experiencing a Mental Health or Behavioral Health Crisis   Follow Up Plan:  The patient has been provided with contact information for the care management team and has been advised to call with any health related questions or concerns.       Plan: The patient declines further follow up and engagement by the care management team and the case will be closed. Appropriate care team members and provider have been notified via electronic communication and the patient  has been provided contact information for the  care management team. The care management team is available to at any time in the future should needs arise.   Andrea Dimes RN, BSN [place]  [facility] RN Care Coordinator [phone]

## 2023-01-27 ENCOUNTER — Ambulatory Visit: Payer: Self-pay | Admitting: Licensed Clinical Social Worker

## 2023-02-04 ENCOUNTER — Ambulatory Visit: Payer: Medicaid Other

## 2023-02-06 ENCOUNTER — Other Ambulatory Visit: Payer: Self-pay

## 2023-02-06 ENCOUNTER — Ambulatory Visit
Admission: RE | Admit: 2023-02-06 | Discharge: 2023-02-06 | Disposition: A | Discharge status: Home / Self Care | Payer: Medicaid Other | Source: Ambulatory Visit | Attending: Physician Assistant | Admitting: Physician Assistant

## 2023-02-06 VITALS — BP 103/69 | HR 81 | Temp 98.6°F | Resp 17

## 2023-02-06 DIAGNOSIS — B3731 Acute candidiasis of vulva and vagina: Secondary | ICD-10-CM | POA: Insufficient documentation

## 2023-02-06 DIAGNOSIS — N898 Other specified noninflammatory disorders of vagina: Secondary | ICD-10-CM | POA: Insufficient documentation

## 2023-02-06 MED ORDER — FLUCONAZOLE 150 MG PO TABS: 150.0000 mg | ORAL_TABLET | Freq: Every day | ORAL | 0 refills | Status: DC
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
## 2023-02-06 NOTE — Discharge Instructions (Addendum)
Lab results available via mychart in 2 - 3 days

## 2023-02-06 NOTE — ED Triage Notes (Signed)
Pt is here with vaginal itching and odor that started yesterday severity but has been having sxs since last month. Pt has taken the prescribed meds to relieve discomfort.

## 2023-02-09 ENCOUNTER — Telehealth: Payer: Medicaid Other | Admitting: Physician Assistant

## 2023-02-09 DIAGNOSIS — N760 Acute vaginitis: Secondary | ICD-10-CM

## 2023-02-09 DIAGNOSIS — B9689 Other specified bacterial agents as the cause of diseases classified elsewhere: Secondary | ICD-10-CM | POA: Diagnosis not present

## 2023-02-09 DIAGNOSIS — B379 Candidiasis, unspecified: Secondary | ICD-10-CM | POA: Diagnosis not present

## 2023-02-09 DIAGNOSIS — T3695XA Adverse effect of unspecified systemic antibiotic, initial encounter: Secondary | ICD-10-CM

## 2023-02-09 LAB — CERVICOVAGINAL ANCILLARY ONLY
Bacterial Vaginitis (gardnerella): POSITIVE — AB
Candida Glabrata: NEGATIVE
Candida Vaginitis: POSITIVE — AB
Chlamydia: NEGATIVE
Comment: NEGATIVE
Comment: NEGATIVE
Comment: NEGATIVE
Comment: NEGATIVE
Comment: NEGATIVE
Comment: NORMAL
Neisseria Gonorrhea: NEGATIVE
Trichomonas: NEGATIVE

## 2023-02-09 MED ORDER — METRONIDAZOLE 0.75 % VA GEL: 1.0000 | Freq: Every day | VAGINAL | 1 refills | Status: DC

## 2023-02-09 MED ORDER — FLUCONAZOLE 150 MG PO TABS: 150.0000 mg | ORAL_TABLET | ORAL | 0 refills | Status: DC | PRN

## 2023-02-09 NOTE — Patient Instructions (Signed)
Rachel Lloyd, thank you for joining Margaretann Loveless, PA-C for today's virtual visit.  While this provider is not your primary care provider (PCP), if your PCP is located in our provider database this encounter information will be shared with them immediately following your visit.   A [facility] MyChart account gives you access to today's visit and all your visits, tests, and labs performed at [facility] " click here if you don't have a [facility] MyChart account or go to [email or web address]  Consent: (Patient) Rachel Lloyd provided verbal consent for this virtual visit at the beginning of the encounter.  Current Medications:  Current Outpatient Medications:    fluconazole (DIFLUCAN) 150 MG tablet, Take 1 tablet (150 mg total) by mouth every 3 (three) days as needed., Disp: 2 tablet, Rfl: 0   metroNIDAZOLE (METROGEL) 0.75 % vaginal gel, Place 1 Applicatorful vaginally at bedtime., Disp: 70 g, Rfl: 1   Medications ordered in this encounter:  Meds ordered this encounter  Medications   metroNIDAZOLE (METROGEL) 0.75 % vaginal gel    Sig: Place 1 Applicatorful vaginally at bedtime.    Dispense:  70 g    Refill:  1    Supervising Provider:   Merrilee Jansky [[id number]]   fluconazole (DIFLUCAN) 150 MG tablet    Sig: Take 1 tablet (150 mg total) by mouth every 3 (three) days as needed.    Dispense:  2 tablet    Refill:  0    Supervising Provider:   Merrilee Jansky [[id number]]     *If you need refills on other medications prior to your next appointment, please contact your pharmacy*  Follow-Up: Call back or seek an in-person evaluation if the symptoms worsen or if the condition fails to improve as anticipated.  [facility] [phone]  Other Instructions Vaginal Probiotics: AZO vaginal probiotic OLLY Happy Hoo-Ha RAW Vaginal Care RenewLife Women's vaginal probiotic RepHresh Pro-B  Vaginal washes: Honey Pot Summer's Eve Vagisil  Feminine cleanser   Healthy vaginal hygiene practices    -  Avoid sleeper pajamas. Nightgowns allow air to circulate.  Sleep without underpants whenever possible.   -  Wear cotton underpants during the day. Double-rinse underwear after washing to avoid residual irritants. Do not use fabric softeners for underwear and swimsuits.   - Avoid tights, leotards, leggings, "skinny" jeans, and other tight-fitting clothing. Skirts and loose-fitting pants allow air to circulate.   - Avoid pantyliners.  Instead use tampons or cotton pads.   - Use the restroom after intercourse to help prevent UTI's   - Daily warm bathing is helpful:     - Soak in clean water (no soap) for 10 to 15 minutes. Adding vinegar or baking soda to the water has not been specifically studied and may not be better than clean water alone.      - Use soap to wash regions other than the genital area just before getting out of the tub. Limit use of any soap on genital areas. Use fragance-free soaps.     - Rinse the genital area well and gently pat dry.  Don't rub.  Hair dryer to assist with drying can be used only if on cool setting.     - Do not use bubble baths or perfumed soaps.   - Do not use any feminine sprays, douches or powders.  These contain chemicals that will irritate the skin.   - If the genital area is tender or swollen, cool compresses  may relieve the discomfort. Unscented wet wipes can be used instead of toilet paper for wiping.    - Emollients, such as Vaseline, may help protect skin and can be applied to the irritated area.   - Always remember to wipe front-to-back after bowel movements. Pat dry after urination.   - Do not sit in wet swimsuits for long periods of time after swimming    If you have been instructed to have an in-person evaluation today at a local Urgent Care facility, please use the link below. It will take you to a list of all of our available [facility], including address, phone  number and hours of operation. Please do not delay care.  [facility]  If you or a family member do not have a primary care provider, use the link below to schedule a visit and establish care. When you choose a [facility] primary care physician or advanced practice provider, you gain a long-term partner in health. Find a Primary Care Provider  Learn more about [facility]'s in-office and virtual care options: [facility] - Get Care Now
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
## 2023-02-24 ENCOUNTER — Other Ambulatory Visit: Payer: Self-pay | Admitting: Physician Assistant

## 2023-02-24 DIAGNOSIS — B379 Candidiasis, unspecified: Secondary | ICD-10-CM

## 2023-02-25 ENCOUNTER — Telehealth: Payer: Medicaid Other | Admitting: Physician Assistant

## 2023-02-25 DIAGNOSIS — B3731 Acute candidiasis of vulva and vagina: Secondary | ICD-10-CM | POA: Diagnosis not present

## 2023-02-25 MED ORDER — FLUCONAZOLE 150 MG PO TABS: 150.0000 mg | ORAL_TABLET | Freq: Once | ORAL | 0 refills | Status: DC

## 2023-02-25 MED ORDER — FLUCONAZOLE 150 MG PO TABS: 150.0000 mg | ORAL_TABLET | Freq: Once | ORAL | 0 refills | Status: AC

## 2023-02-25 NOTE — Progress Notes (Signed)
E-Visit for Vaginal Symptoms ? ?We are sorry that you are not feeling well. Here is how we plan to help! ?Based on what you shared with me it looks like you: May have a yeast vaginosis ? ?Vaginosis is an inflammation of the vagina that can result in discharge, itching and pain. The cause is usually a change in the normal balance of vaginal bacteria or an infection. Vaginosis can also result from reduced estrogen levels after menopause. ? ?The most common causes of vaginosis are: ? ? Bacterial vaginosis which results from an overgrowth of one on several organisms that are normally present in your vagina. ? ? Yeast infections which are caused by a naturally occurring fungus called candida. ? ? Vaginal atrophy (atrophic vaginosis) which results from the thinning of the vagina from reduced estrogen levels after menopause. ? ? Trichomoniasis which is caused by a parasite and is commonly transmitted by sexual intercourse. ? ?Factors that increase your risk of developing vaginosis include: ?Medications, such as antibiotics and steroids ?Uncontrolled diabetes ?Use of hygiene products such as bubble bath, vaginal spray or vaginal deodorant ?Douching ?Wearing damp or tight-fitting clothing ?Using an intrauterine device (IUD) for birth control ?Hormonal changes, such as those associated with pregnancy, birth control pills or menopause ?Sexual activity ?Having a sexually transmitted infection ? ?Your treatment plan is A single Diflucan (fluconazole) 150mg  tablet once.  I have electronically sent this prescription into the pharmacy that you have chosen. ? ?Be sure to take all of the medication as directed. Stop taking any medication if you develop a rash, tongue swelling or shortness of breath. Mothers who are breast feeding should consider pumping and discarding their breast milk while on these antibiotics. However, there is no consensus that infant exposure at these doses would be harmful.  ?Remember that medication creams can  weaken latex condoms. ?. ? ? ?HOME CARE: ? ?Good hygiene may prevent some types of vaginosis from recurring and may relieve some symptoms: ? ?Avoid baths, hot tubs and whirlpool spas. Rinse soap from your outer genital area after a shower, and dry the area well to prevent irritation. Don't use scented or harsh soaps, such as those with deodorant or antibacterial action. ?Avoid irritants. These include scented tampons and pads. ?Wipe from front to back after using the toilet. Doing so avoids spreading fecal bacteria to your vagina. ? ?Other things that may help prevent vaginosis include: ? ?Don't douche. Your vagina doesn't require cleansing other than normal bathing. Repetitive douching disrupts the normal organisms that reside in the vagina and can actually increase your risk of vaginal infection. Douching won't clear up a vaginal infection. ?Use a latex condom. Both female and female latex condoms may help you avoid infections spread by sexual contact. ?Wear cotton underwear. Also wear pantyhose with a cotton crotch. If you feel comfortable without it, skip wearing underwear to bed. Yeast thrives in moist environments ?Your symptoms should improve in the next day or two. ? ?GET HELP RIGHT AWAY IF: ? ?You have pain in your lower abdomen ( pelvic area or over your ovaries) ?You develop nausea or vomiting ?You develop a fever ?Your discharge changes or worsens ?You have persistent pain with intercourse ?You develop shortness of breath, a rapid pulse, or you faint. ? ?These symptoms could be signs of problems or infections that need to be evaluated by a medical provider now. ? ?MAKE SURE YOU  ? ?Understand these instructions. ?Will watch your condition. ?Will get help right away if you are not  doing well or get worse. ? ?Thank you for choosing an e-visit. ? ?Your e-visit answers were reviewed by a board certified advanced clinical practitioner to complete your personal care plan. Depending upon the condition, your plan  could have included both over the counter or prescription medications. ? ?Please review your pharmacy choice. Make sure the pharmacy is open so you can pick up prescription now. If there is a problem, you may contact your provider through [employer] and have the prescription routed to another pharmacy.  Your safety is important to [place]. If you have drug allergies check your prescription carefully.  ? ?For the next 24 hours you can use MyChart to ask questions about today's visit, request a non-urgent call back, or ask for a work or school excuse. ?You will get an email in the next two days asking about your experience. I hope that your e-visit has been valuable and will speed your recovery. ?I have spent 5 minutes in review of e-visit questionnaire, review and updating patient chart, medical decision making and response to patient.  ? ?Sander Speckman S Mayers, PA-C ? ? ? ?

## 2023-02-25 NOTE — Addendum Note (Signed)
Addended by: Roney Jaffe on: 02/25/2023 09:12 PM   Modules accepted: Orders

## 2023-03-17 IMAGING — US US OB < 14 WEEKS - US OB TV
1 series · 15 of 28 positions shown · non-contrast
Comparison: None.

CLINICAL DATA: Lower abdominal pain and back pain for 1 week. Last
menstrual period 02/27/2021. Estimated due date by last menstrual
period 12/04/2021. Estimated gestational age by last menstrual
period 5 weeks and 0 days.

EXAM:
OBSTETRIC <14 WK US AND TRANSVAGINAL OB US
TECHNIQUE: Both transabdominal and transvaginal ultrasound examinations were
performed for complete evaluation of the gestation as well as the
maternal uterus, adnexal regions, and pelvic cul-de-sac.
Transvaginal technique was performed to assess early pregnancy.

[Series 1: us ob < 14 weeks - us ob tv · 15 of 46 slices shown]
[im 1/46]
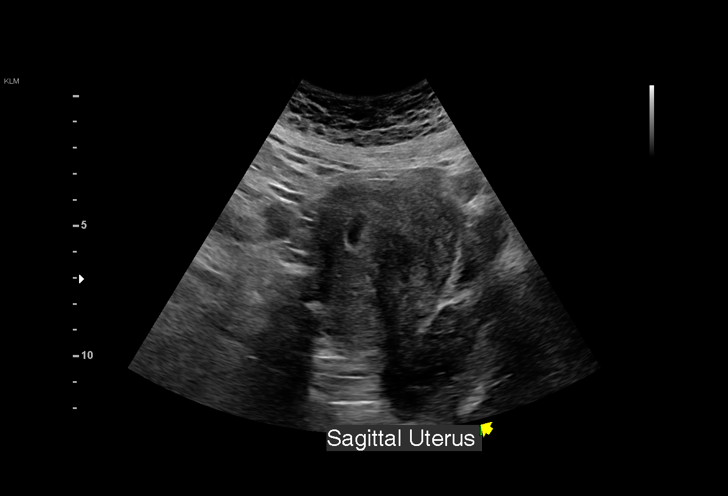
[im 4/46]
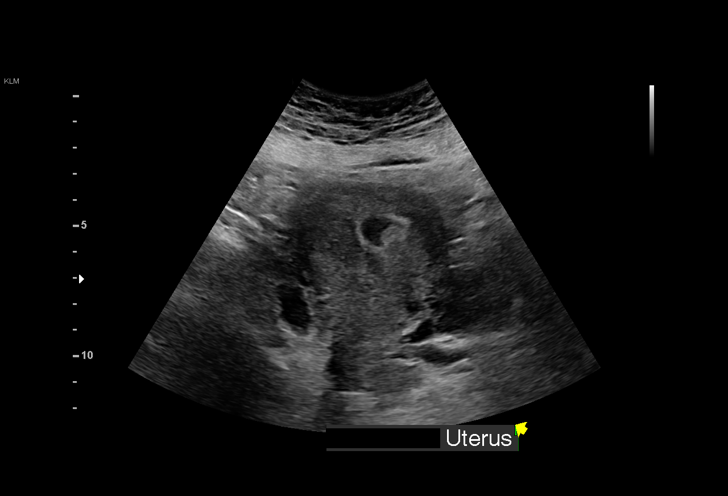
[im 7/46]
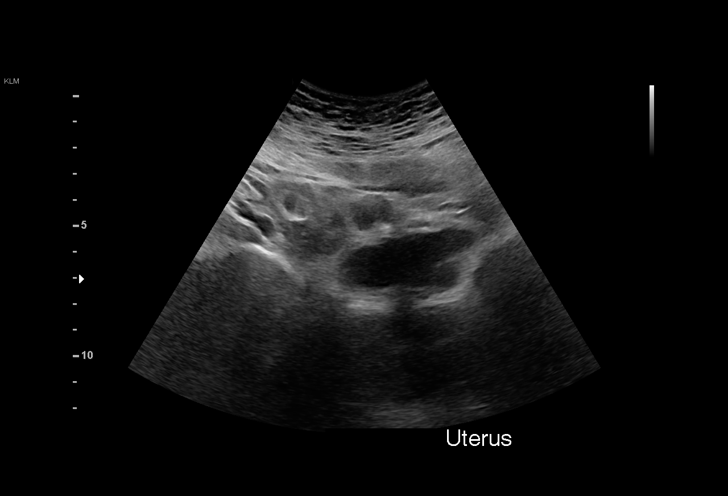
[im 11/46]
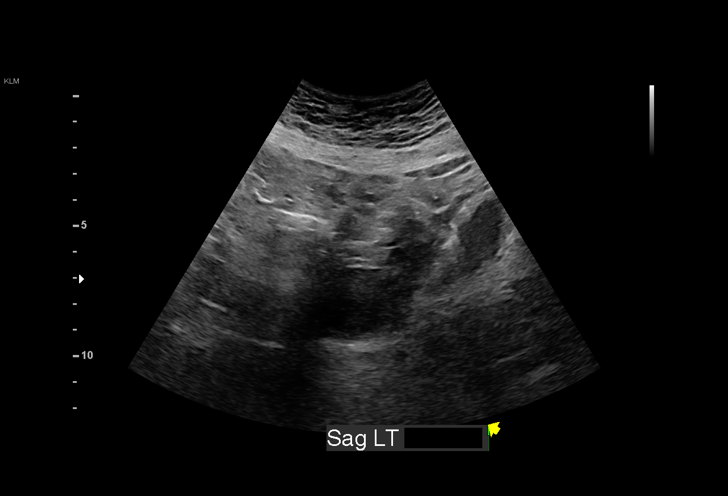
[im 14/46]
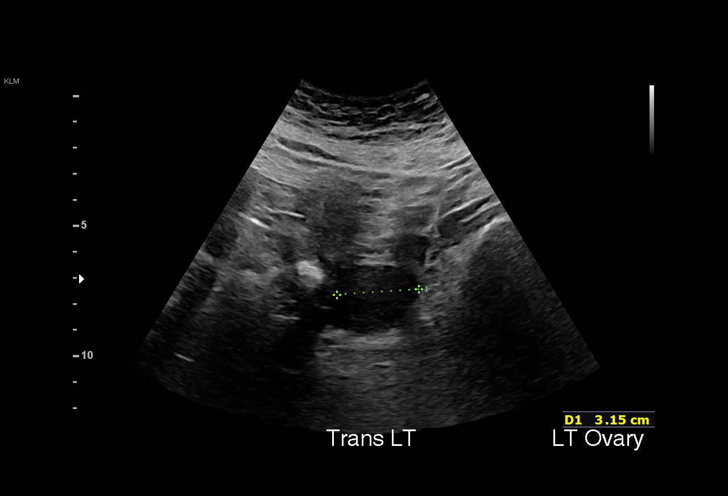
[im 17/46]
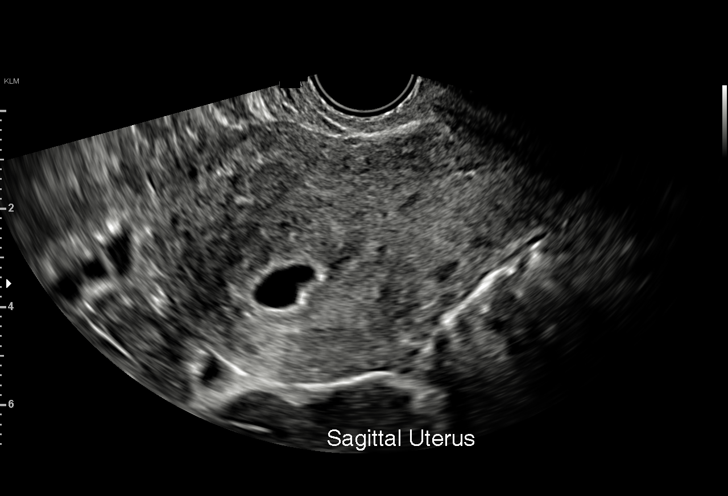
[im 21/46]
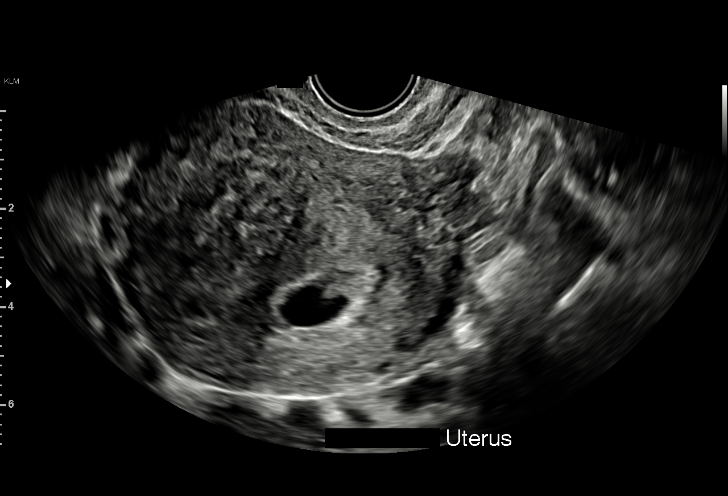
[im 24/46]
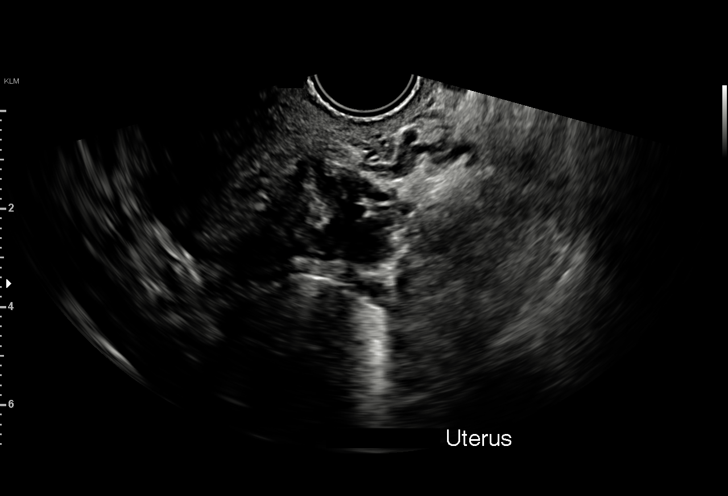
[im 26/46]
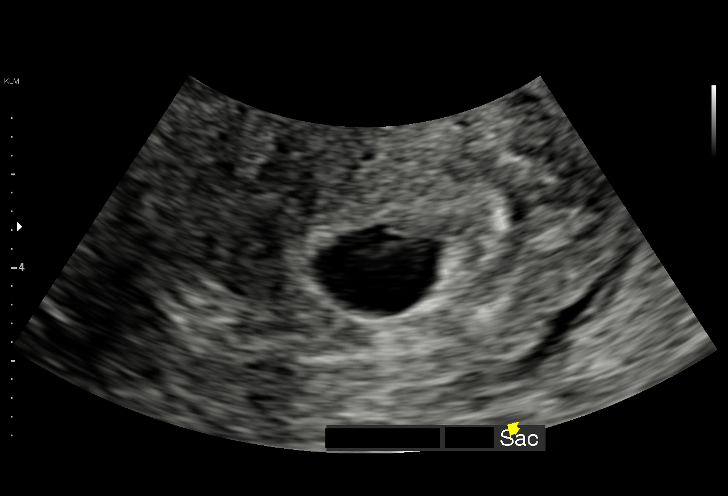
[im 29/46]
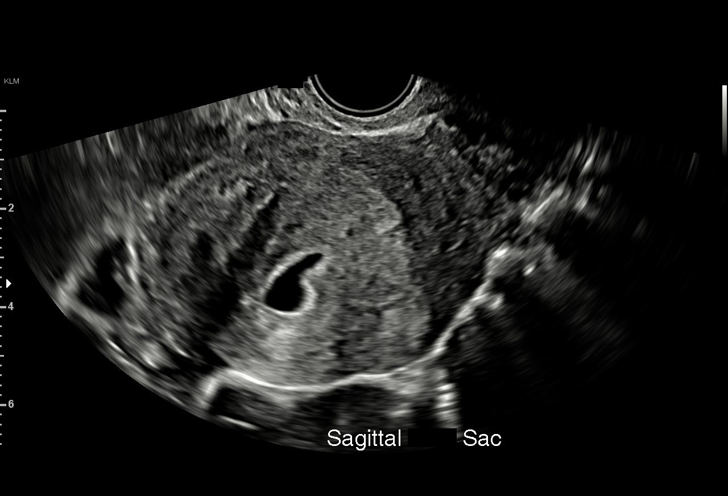
[im 32/46]
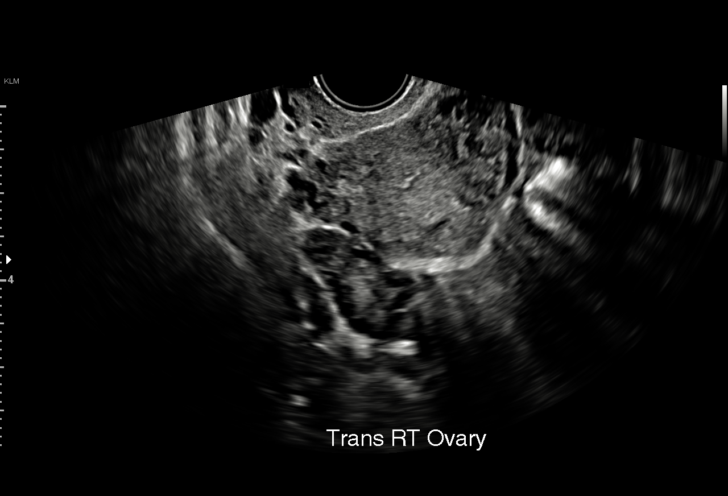
[im 36/46]
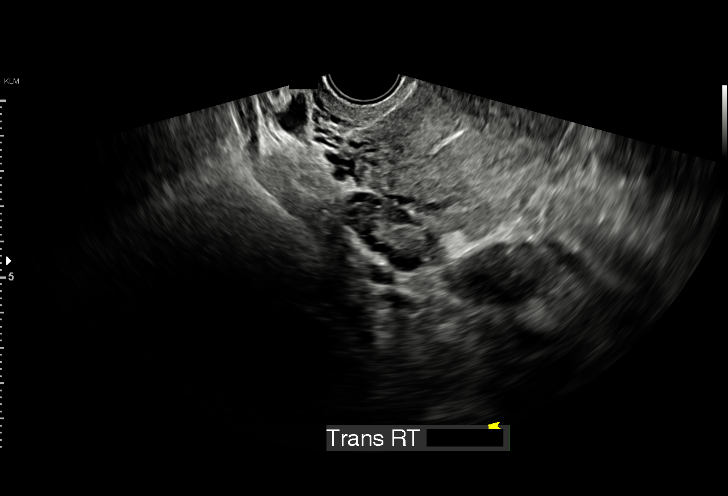
[im 39/46]
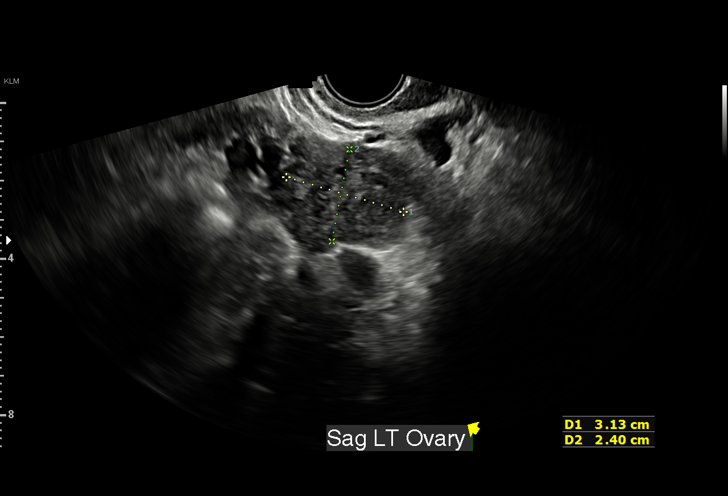
[im 42/46]
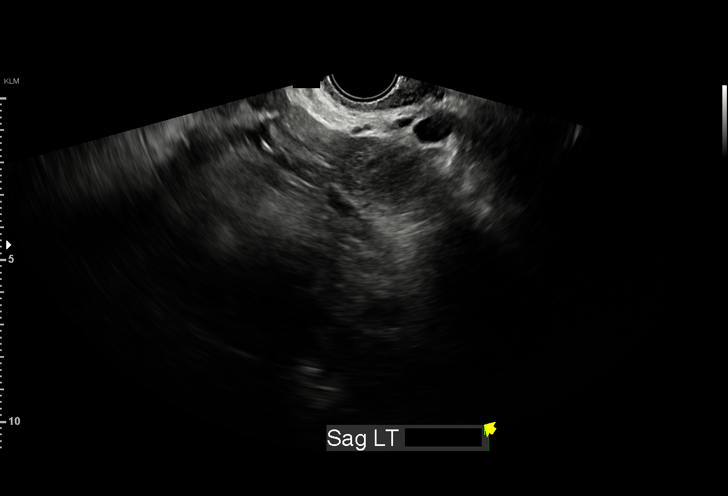
[im 46/46]
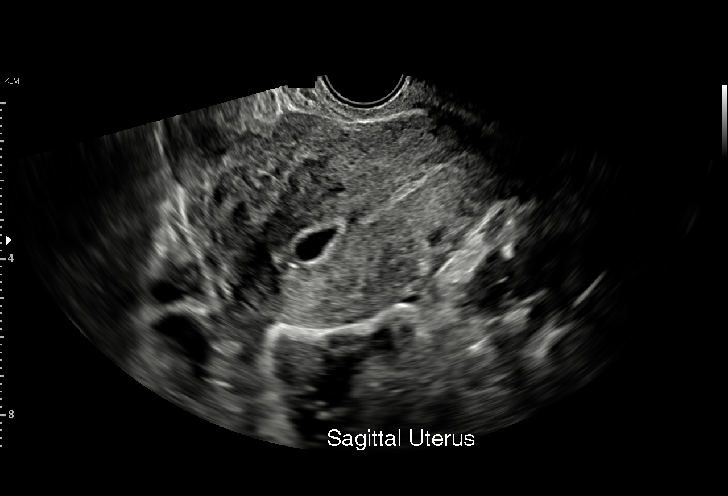

[15 of 28 positions shown; findings below may reference images not displayed]

FINDINGS: Intrauterine gestational sac: Single

Yolk sac:  Not Visualized.

Embryo:  Not Visualized.

Cardiac Activity: Not Visualized.

MSD: 12  mm   5 w   6  d

Subchorionic hemorrhage:  None visualized.

Maternal uterus/adnexae: Bilateral necks are unremarkable. A corpus
luteum cyst is noted on the left.

Other: No free fluid.
IMPRESSION: Probable early intrauterine gestational sac, but no yolk sac, fetal
pole, or cardiac activity yet visualized. Recommend follow-up
quantitative B-HCG levels and follow-up US in 14 days to assess
viability. This recommendation follows SRU consensus guidelines:
Diagnostic Criteria for Nonviable Pregnancy Early in the First
Trimester. N Engl J Med 5523; [DATE].

## 2023-03-27 ENCOUNTER — Ambulatory Visit

## 2023-03-27 NOTE — Progress Notes (Deleted)
    SUBJECTIVE:   CHIEF COMPLAINT / HPI: Sick  ***  PERTINENT  PMH / PSH: migraine with aura, bipolar disorder, MDD  OBJECTIVE:   There were no vitals taken for this visit.  ***  ASSESSMENT/PLAN:   No problem-specific Assessment & Plan notes found for this encounter.     Levin Erp, MD [facility]

## 2023-03-31 ENCOUNTER — Ambulatory Visit: Admitting: Student

## 2023-03-31 ENCOUNTER — Encounter: Payer: Self-pay | Admitting: Student

## 2023-03-31 ENCOUNTER — Other Ambulatory Visit (HOSPITAL_COMMUNITY)
Admission: RE | Admit: 2023-03-31 | Discharge: 2023-03-31 | Disposition: A | Discharge status: Home / Self Care | Source: Ambulatory Visit | Attending: Family Medicine | Admitting: Family Medicine

## 2023-03-31 VITALS — BP 124/78 | HR 87 | Ht 61.5 in | Wt 132.8 lb

## 2023-03-31 DIAGNOSIS — R6889 Other general symptoms and signs: Secondary | ICD-10-CM

## 2023-03-31 DIAGNOSIS — N898 Other specified noninflammatory disorders of vagina: Secondary | ICD-10-CM

## 2023-03-31 DIAGNOSIS — N926 Irregular menstruation, unspecified: Secondary | ICD-10-CM

## 2023-03-31 LAB — POCT WET PREP (WET MOUNT)
Clue Cells Wet Prep Whiff POC: POSITIVE
Trichomonas Wet Prep HPF POC: ABSENT

## 2023-03-31 LAB — POC SOFIA 2 FLU + SARS ANTIGEN FIA
Influenza A, POC: NEGATIVE
Influenza B, POC: NEGATIVE
SARS Coronavirus 2 Ag: NEGATIVE

## 2023-03-31 LAB — POCT URINE PREGNANCY: Preg Test, Ur: NEGATIVE

## 2023-03-31 NOTE — Progress Notes (Signed)
    SUBJECTIVE:   CHIEF COMPLAINT / HPI:   Rachel Lloyd is a 38 y.o. female  presenting for URI, vaginal discharge.   URI:  Symptoms started Wednesday.  Other child at home was sick with the flu she has been having increased body aches, vomiting last week, headache.  Medications tried at home over-the-counter Tylenol.  Tmax: Unknown Sick contacts: Child Signs of respiratory distress: No Appetite: Normal Hydration: Normal  Vaginal discharge: Patient reports she has been having pain around her umbilical region.  She is also had abnormal vaginal discharge.  She reports she has been spotting.  She took Plan B 2 weeks ago.  She is not currently desiring a pregnancy.  She has had multiple partners and not used barrier protection.  PERTINENT  PMH / PSH: Reviewed and updated   OBJECTIVE:   BP 124/78   Pulse 87   Ht 5' 1.5" (1.562 m)   Wt 132 lb 12.8 oz (60.2 kg)   LMP  (LMP Unknown)   SpO2 97%   BMI 24.69 kg/m   ill-appearing, no acute distress HEENT: erythematous nasal turbinates, clear TMs bilaterally, mild cervical lymphadenopathy bilaterally. Mild maxillary sinus tenderness Cardio: Regular rate, regular rhythm, no murmurs on exam. <2 sec capillary refill  Pulm: Clear, no wheezing, no crackles. No increased work of breathing Abdominal: bowel sounds present, soft, non-tender, non-distended  Pelvic Exam: MA chaperone present  Normal external genitalia No abnormal discharge  No cervical motion tenderness  Cervix visualized with no lesions    ASSESSMENT/PLAN:   Irregular bleeding Urine pregnancy test obtained today which resulted negative.  Vaginal discharge Wet prep, GC, RPR and HIV obtained today Wet prep positive for clue cells and bacteria.  Will hold off on treatment at this time.   URI:  No signs of respiratory distress on exam. Patient appears well hydrated.  Most likely viral mediated, supportive therapy indicated with return precautions for trouble breathing or  persistent fever.  POC tested preformed negative for COVID and flu.   Glendale Chard, DO [facility]

## 2023-03-31 NOTE — Patient Instructions (Signed)
 It was great to see you today!   I am checking your urine for pregnancy.  I am also doing a full STI check.  I will message you with results.  No future appointments.  Please arrive 15 minutes before your appointment to ensure smooth check in process.    Please call the clinic at [phone] if your symptoms worsen or you have any concerns.  Thank you for allowing me to participate in your care, Dr. Glendale Chard [facility]

## 2023-03-31 NOTE — Assessment & Plan Note (Addendum)
 Urine pregnancy test obtained today which resulted negative.

## 2023-03-31 NOTE — Assessment & Plan Note (Addendum)
 Wet prep, GC, RPR and HIV obtained today Wet prep positive for clue cells and bacteria.  Will hold off on treatment at this time.

## 2023-03-31 NOTE — Addendum Note (Signed)
 Addended by: Glendale Chard on: 03/31/2023 09:53 AM   Modules accepted: Level of Service

## 2023-04-01 ENCOUNTER — Telehealth: Payer: Self-pay | Admitting: Family Medicine

## 2023-04-01 DIAGNOSIS — B9689 Other specified bacterial agents as the cause of diseases classified elsewhere: Secondary | ICD-10-CM

## 2023-04-01 DIAGNOSIS — A749 Chlamydial infection, unspecified: Secondary | ICD-10-CM

## 2023-04-01 LAB — CERVICOVAGINAL ANCILLARY ONLY
Chlamydia: POSITIVE — AB
Comment: NEGATIVE
Comment: NORMAL
Neisseria Gonorrhea: NEGATIVE

## 2023-04-01 LAB — RPR: RPR Ser Ql: NONREACTIVE

## 2023-04-01 LAB — HIV ANTIBODY (ROUTINE TESTING W REFLEX): HIV Screen 4th Generation wRfx: NONREACTIVE

## 2023-04-01 MED ORDER — DOXYCYCLINE HYCLATE 100 MG PO TABS: 100.0000 mg | ORAL_TABLET | Freq: Two times a day (BID) | ORAL | 0 refills | Status: AC

## 2023-04-01 MED ORDER — METRONIDAZOLE 0.75 % VA GEL: 1.0000 | Freq: Every day | VAGINAL | 0 refills | Status: DC

## 2023-04-01 NOTE — Telephone Encounter (Signed)
**  After Hours/ Emergency Line Call**  Received a page to call [phone] - [phone].  Patient: Rachel Lloyd  Caller: Self  Confirmed name & DOB of patient with caller  Subjective:  Patient calls regarding test results she noted on MyChart.  She was seen by Dr. Hyacinth Meeker 3/18.  She was positive for BV and chlamydia.  Requesting that medications be sent in for this.  She is still symptomatic with lower abdominal pain, nausea, vaginal discharge.  Denies any fevers.  States that she reached out to her partner to inform them of STD diagnosis and recommended that they be treated.  Requesting MetroGel as patient states that she has not tolerated Flagyl pills well in the past (admits to drinking alcohol which causes her to throw up her pills)  Denies further concerns  Objective:  Observations: NAD, speaking full sentences, pleasant   Assessment & Plan  Rachel Lloyd is a 38 y.o. female noted to be positive for BV and chlamydia  Recommendations:  Rx doxycycline 100 mg twice daily x 7 days for chlamydia Rx MetroGel nightly x 5 days for BV  -- Red flags discussed.   -- Will forward to PCP as well as Dr. Hoy Morn, MD [facility], PGY-2

## 2023-04-17 ENCOUNTER — Other Ambulatory Visit: Payer: Self-pay | Admitting: Student

## 2023-04-17 MED ORDER — FLUCONAZOLE 150 MG PO TABS
150.0000 mg | ORAL_TABLET | Freq: Once | ORAL | 0 refills | Status: AC
Start: 2023-04-17 — End: 2023-04-17

## 2023-04-17 NOTE — Progress Notes (Signed)
 Patient notes yeast infection following course of abx. Will send one time dose of abx. Patient note's she's not been sexually active since her period.

## 2023-04-23 ENCOUNTER — Ambulatory Visit (INDEPENDENT_AMBULATORY_CARE_PROVIDER_SITE_OTHER): Admitting: Student

## 2023-04-23 ENCOUNTER — Encounter: Payer: Self-pay | Admitting: Student

## 2023-04-23 VITALS — BP 90/80 | HR 91 | Ht 61.0 in | Wt 131.5 lb

## 2023-04-23 DIAGNOSIS — K009 Disorder of tooth development, unspecified: Secondary | ICD-10-CM | POA: Diagnosis not present

## 2023-04-23 DIAGNOSIS — A749 Chlamydial infection, unspecified: Secondary | ICD-10-CM

## 2023-04-23 NOTE — Progress Notes (Unsigned)
    SUBJECTIVE:   CHIEF COMPLAINT / HPI:   38 year old female presents dental issues following recent extractions that have resulted in her partial denture no longer fitting correctly. She reports that the teeth that the partial part of her denture was attached to have been removed, leaving her without teeth in the back of her mouth. This has affected her ability to eat. The dentist has advised her to get a referral from her primary care provider for a new denture, as she is not due for a replacement for another two years.  In addition, the patient is also following up on treatment for a sexually transmitted disease (STD). She was treated with a seven-day course of doxycycline after testing positive for chlamydia approximately two weeks ago.   PERTINENT  PMH / PSH: Reviewed   OBJECTIVE:   BP 90/80   Pulse 91   Ht 5\' 1"  (1.549 m)   Wt 131 lb 8 oz (59.6 kg)   LMP 04/19/2023   SpO2 99%   BMI 24.85 kg/m    Physical Exam General: Alert, well appearing, NAD, HEENT: Missing two left superior premolars Cardiovascular: RRR, No Murmurs, Normal S2/S2 Respiratory: CTAB, No wheezing or Rales Abdomen: No distension or tenderness   ASSESSMENT/PLAN:    Dental Prosthesis Issue Tooth extraction affected denture fit, causing eating difficulty and weight loss. Dentist recommended new denture. - Letter provided to patient explaining necessity for new denture.  Chlamydia Completed antibiotics two weeks ago. Reassured of treatment efficacy. Partner treated. - Retest for chlamydia in two weeks to confirm clearance.    Jerre Simon, MD [facility]   {    This will disappear when note is signed, click to select method of visit    :1}

## 2023-04-23 NOTE — Patient Instructions (Signed)
 Pleasure to see you today.  I have provided you a letter today to adjust your dentures   Also it will be too early to retest for cure of STD at this time. Please follow up in 2 weeks to recheck for chlamydia.

## 2023-05-07 ENCOUNTER — Ambulatory Visit (INDEPENDENT_AMBULATORY_CARE_PROVIDER_SITE_OTHER): Admitting: Student

## 2023-05-07 ENCOUNTER — Other Ambulatory Visit (HOSPITAL_COMMUNITY)
Admission: RE | Admit: 2023-05-07 | Discharge: 2023-05-07 | Disposition: A | Discharge status: Home / Self Care | Source: Ambulatory Visit | Attending: Family Medicine | Admitting: Family Medicine

## 2023-05-07 ENCOUNTER — Encounter: Payer: Self-pay | Admitting: Student

## 2023-05-07 VITALS — BP 117/88 | HR 85 | Ht 61.0 in | Wt 135.8 lb

## 2023-05-07 DIAGNOSIS — N898 Other specified noninflammatory disorders of vagina: Secondary | ICD-10-CM | POA: Diagnosis not present

## 2023-05-07 DIAGNOSIS — T148XXA Other injury of unspecified body region, initial encounter: Secondary | ICD-10-CM

## 2023-05-07 LAB — POCT WET PREP (WET MOUNT)
Clue Cells Wet Prep Whiff POC: NEGATIVE
Trichomonas Wet Prep HPF POC: ABSENT

## 2023-05-07 LAB — POCT URINE PREGNANCY: Preg Test, Ur: NEGATIVE

## 2023-05-07 MED ORDER — FLUCONAZOLE 150 MG PO TABS: 150.0000 mg | ORAL_TABLET | ORAL | 0 refills | Status: AC

## 2023-05-07 NOTE — Progress Notes (Signed)
  SUBJECTIVE:   CHIEF COMPLAINT / HPI:   The patient, with a history of chlamydia, presents with a persistent yeast infection and left-sided pain. She was previously treated with doxycycline  for chlamydia approximately 1 month ago and developed a yeast infection during that time. Despite treatment with single dose Diflucan , the yeast infection has persisted for at least two weeks, characterized by clumpy, white discharge.  She denies fever and dysuria.  In addition to the yeast infection, the patient has been experiencing left-sided pain inconsistently a couple days ago. The pain is associated with movement but is exacerbated by deep breathing.   The patient has been sexually active since her chlamydia diagnosis, with the same partner who was also treated.  She states they did not engage in sexual intercourse until approximately 9 days after treatment.  She reports using protection most of the time, but there was one instance where she did not. Her last menstrual period was on April 6th.  PERTINENT  PMH / PSH: MDD  OBJECTIVE:  BP 117/88   Pulse 85   Ht 5\' 1"  (1.549 m)   Wt 135 lb 12.8 oz (61.6 kg)   LMP 04/19/2023   SpO2 100%   BMI 25.66 kg/m  General: Well-appearing, NAD MSK: Left-sided posterior portion of fifth rib tenderness with left rotation, no CVA tenderness Abdomen: No abdominal or suprapubic tenderness Pelvic exam: VULVA: normal appearing vulva with no masses, tenderness or lesions, VAGINA: vaginal discharge - white and curd-like, CERVIX: normal appearing cervix without discharge or lesions, exam chaperoned by Alain Howard, CMA.   ASSESSMENT/PLAN:   Assessment & Plan Vaginal discharge Retest for STDs.  Wet prep shows bacteria without signs of BV or yeast.  Urine pregnancy negative.  Given exam, will treat as yeast infection.  Recommend following up with PCP during nonacute time to discuss prophylactic measures/treatment if necessary.  Consider yeast PCR testing in the future  should Diflucan  treatment not work. Muscle strain Exam and history consistent with muscle strain.  Conservative management.  No concern for kidney involvement in relation to history of vaginal discharge.  Veronia Goon, DO 05/07/2023, 10:14 AM PGY-3, [facility]

## 2023-05-07 NOTE — Assessment & Plan Note (Addendum)
 Retest for STDs.  Wet prep shows bacteria without signs of BV or yeast.  Urine pregnancy negative.  Given exam, will treat as yeast infection.  Recommend following up with PCP during nonacute time to discuss prophylactic measures/treatment if necessary.  Consider yeast PCR testing in the future should Diflucan  treatment not work.

## 2023-05-08 LAB — CERVICOVAGINAL ANCILLARY ONLY
Chlamydia: NEGATIVE
Comment: NEGATIVE
Comment: NEGATIVE
Comment: NORMAL
Neisseria Gonorrhea: NEGATIVE
Trichomonas: NEGATIVE

## 2023-05-28 ENCOUNTER — Encounter (HOSPITAL_COMMUNITY): Payer: Self-pay

## 2023-05-28 ENCOUNTER — Ambulatory Visit (HOSPITAL_COMMUNITY)
Admission: RE | Admit: 2023-05-28 | Discharge: 2023-05-28 | Disposition: A | Discharge status: Home / Self Care | Source: Ambulatory Visit | Attending: Emergency Medicine | Admitting: Emergency Medicine

## 2023-05-28 VITALS — BP 116/83 | HR 87 | Temp 98.1°F | Resp 17 | Ht 61.5 in | Wt 130.0 lb

## 2023-05-28 DIAGNOSIS — Z3202 Encounter for pregnancy test, result negative: Secondary | ICD-10-CM | POA: Diagnosis not present

## 2023-05-28 DIAGNOSIS — N898 Other specified noninflammatory disorders of vagina: Secondary | ICD-10-CM | POA: Diagnosis not present

## 2023-05-28 LAB — POCT URINALYSIS DIP (MANUAL ENTRY)
Bilirubin, UA: NEGATIVE
Glucose, UA: NEGATIVE mg/dL
Ketones, POC UA: NEGATIVE mg/dL
Leukocytes, UA: NEGATIVE
Nitrite, UA: NEGATIVE
Protein Ur, POC: NEGATIVE mg/dL
Spec Grav, UA: 1.025 (ref 1.010–1.025)
Urobilinogen, UA: 0.2 U/dL
pH, UA: 6 (ref 5.0–8.0)

## 2023-05-28 LAB — POCT URINE PREGNANCY: Preg Test, Ur: NEGATIVE

## 2023-05-28 NOTE — ED Triage Notes (Signed)
 Pt states that she has some vaginal odor, irritation and discharge. X2 days

## 2023-05-28 NOTE — Discharge Instructions (Signed)
 Your urine pregnancy test was negative.  Your urine test did not show any signs of infection.  Your swab results will return over the next few days and someone will call if results are positive and require any treatment at that time.  Return here as needed.
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
## 2023-05-29 ENCOUNTER — Ambulatory Visit (HOSPITAL_COMMUNITY): Payer: Self-pay

## 2023-05-29 LAB — CERVICOVAGINAL ANCILLARY ONLY
Bacterial Vaginitis (gardnerella): POSITIVE — AB
Candida Glabrata: NEGATIVE
Candida Vaginitis: NEGATIVE
Chlamydia: NEGATIVE
Comment: NEGATIVE
Comment: NEGATIVE
Comment: NEGATIVE
Comment: NEGATIVE
Comment: NEGATIVE
Comment: NORMAL
Neisseria Gonorrhea: NEGATIVE
Trichomonas: NEGATIVE

## 2023-05-29 MED ORDER — FLUCONAZOLE 150 MG PO TABS: 150.0000 mg | ORAL_TABLET | Freq: Once | ORAL | 0 refills | Status: AC

## 2023-05-29 MED ORDER — METRONIDAZOLE 0.75 % VA GEL: 1.0000 | Freq: Every day | VAGINAL | 0 refills | Status: AC

## 2023-05-29 MED ORDER — METRONIDAZOLE 500 MG PO TABS: 500.0000 mg | ORAL_TABLET | Freq: Two times a day (BID) | ORAL | 0 refills | Status: DC

## 2023-05-29 NOTE — Addendum Note (Signed)
 Addended by: Sharilyn Davenport on: 05/29/2023 05:48 PM   Modules accepted: Orders

## 2023-05-29 NOTE — Telephone Encounter (Addendum)
 Pt requested metrogel  for BV and diflucan  for abx-associated yeast infection.

## 2023-06-23 ENCOUNTER — Encounter: Payer: Self-pay | Admitting: *Deleted

## 2023-07-20 ENCOUNTER — Ambulatory Visit
Admission: RE | Admit: 2023-07-20 | Discharge: 2023-07-20 | Disposition: A | Discharge status: Home / Self Care | Source: Ambulatory Visit | Attending: Physician Assistant | Admitting: Physician Assistant

## 2023-07-20 ENCOUNTER — Other Ambulatory Visit: Payer: Self-pay

## 2023-07-20 VITALS — BP 117/78 | HR 90 | Temp 98.4°F | Resp 16 | Ht 61.5 in | Wt 130.0 lb

## 2023-07-20 DIAGNOSIS — R319 Hematuria, unspecified: Secondary | ICD-10-CM

## 2023-07-20 DIAGNOSIS — N898 Other specified noninflammatory disorders of vagina: Secondary | ICD-10-CM | POA: Diagnosis not present

## 2023-07-20 DIAGNOSIS — R300 Dysuria: Secondary | ICD-10-CM | POA: Diagnosis not present

## 2023-07-20 DIAGNOSIS — N390 Urinary tract infection, site not specified: Secondary | ICD-10-CM | POA: Diagnosis not present

## 2023-07-20 LAB — POCT URINALYSIS DIP (MANUAL ENTRY)
Bilirubin, UA: NEGATIVE
Glucose, UA: NEGATIVE mg/dL
Ketones, POC UA: NEGATIVE mg/dL
Nitrite, UA: NEGATIVE
Spec Grav, UA: >=1.03 — AB (ref 1.010–1.025)
Urobilinogen, UA: 0.2 U/dL
pH, UA: 5.5 (ref 5.0–8.0)

## 2023-07-20 LAB — POCT URINE PREGNANCY: Preg Test, Ur: NEGATIVE

## 2023-07-20 MED ORDER — NYSTATIN 100000 UNIT/GM EX CREA: TOPICAL_CREAM | CUTANEOUS | 0 refills | Status: AC

## 2023-07-20 MED ORDER — NITROFURANTOIN MONOHYD MACRO 100 MG PO CAPS: 100.0000 mg | ORAL_CAPSULE | Freq: Two times a day (BID) | ORAL | 0 refills | Status: AC

## 2023-07-20 MED ORDER — FLUCONAZOLE 150 MG PO TABS: 150.0000 mg | ORAL_TABLET | ORAL | 0 refills | Status: DC | PRN
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
## 2023-07-20 NOTE — ED Triage Notes (Signed)
 Pt presents with complaints of vaginal itching x 2 days. States it burns to urinate and there is an abnormal odor to vagina. Currently denies pain. Vagisil used, although it made vagina burn. Pt states it is very tender in that area. Allergic to latex. Had intercourse with a latex condom on Saturday 7/5.

## 2023-07-20 NOTE — Discharge Instructions (Addendum)
   We will keep you updated on the results of your cervicovaginal swab once the results are available.  If medication or treatment is indicated by those results that will be sent into the pharmacy that we have on file. Please make sure that you are practicing safe sex and using barrier methods to prevent exposure. It is recommended to avoid intercourse until you have the results back from testing and have completed any treatments that are sent in for you.

## 2023-07-21 ENCOUNTER — Ambulatory Visit (HOSPITAL_COMMUNITY): Payer: Self-pay

## 2023-07-21 LAB — CERVICOVAGINAL ANCILLARY ONLY
Bacterial Vaginitis (gardnerella): POSITIVE — AB
Candida Glabrata: NEGATIVE
Candida Vaginitis: NEGATIVE
Chlamydia: NEGATIVE
Comment: NEGATIVE
Comment: NEGATIVE
Comment: NEGATIVE
Comment: NEGATIVE
Comment: NEGATIVE
Comment: NORMAL
Neisseria Gonorrhea: NEGATIVE
Trichomonas: NEGATIVE

## 2023-07-21 MED ORDER — METRONIDAZOLE 500 MG PO TABS: 500.0000 mg | ORAL_TABLET | Freq: Two times a day (BID) | ORAL | 0 refills | Status: DC

## 2023-07-22 LAB — URINE CULTURE

## 2023-07-23 MED ORDER — METRONIDAZOLE 0.75 % VA GEL: 1.0000 | Freq: Every day | VAGINAL | 0 refills | Status: AC

## 2023-07-23 NOTE — Addendum Note (Signed)
 Addended by: ARNALDO ALFONSO RAMAN on: 07/23/2023 09:48 AM   Modules accepted: Orders

## 2023-08-15 ENCOUNTER — Telehealth: Admitting: Nurse Practitioner

## 2023-08-15 DIAGNOSIS — B9689 Other specified bacterial agents as the cause of diseases classified elsewhere: Secondary | ICD-10-CM | POA: Diagnosis not present

## 2023-08-15 DIAGNOSIS — N760 Acute vaginitis: Secondary | ICD-10-CM | POA: Diagnosis not present

## 2023-08-15 DIAGNOSIS — T3695XA Adverse effect of unspecified systemic antibiotic, initial encounter: Secondary | ICD-10-CM

## 2023-08-15 DIAGNOSIS — B379 Candidiasis, unspecified: Secondary | ICD-10-CM | POA: Diagnosis not present

## 2023-08-15 MED ORDER — FLUCONAZOLE 150 MG PO TABS
150.0000 mg | ORAL_TABLET | ORAL | 0 refills | Status: DC | PRN
Start: 2023-08-15 — End: 2023-11-04

## 2023-08-15 MED ORDER — METRONIDAZOLE 0.75 % VA GEL: 1.0000 | Freq: Every day | VAGINAL | 0 refills | Status: DC

## 2023-08-15 NOTE — Patient Instructions (Signed)
  Rachel Lloyd, thank you for joining Haze LELON Servant, NP for today's virtual visit.  While this provider is not your primary care provider (PCP), if your PCP is located in our provider database this encounter information will be shared with them immediately following your visit.   A [facility] MyChart account gives you access to today's visit and all your visits, tests, and labs performed at [facility]  click here if you don't have a [facility] MyChart account or go to [email or web address]  Consent: (Patient) Rachel Lloyd provided verbal consent for this virtual visit at the beginning of the encounter.  Current Medications:  Current Outpatient Medications:    fluconazole  (DIFLUCAN ) 150 MG tablet, Take 1 tablet (150 mg total) by mouth every three (3) days as needed. May repeat in 3 days if symptoms not resolved, Disp: 4 tablet, Rfl: 0   metroNIDAZOLE  (METROGEL ) 0.75 % vaginal gel, Place 1 Applicatorful vaginally at bedtime. For 5 days., Disp: 70 g, Rfl: 0   nystatin  cream (MYCOSTATIN ), Apply to affected area 2 times daily, Disp: 30 g, Rfl: 0   Medications ordered in this encounter:  Meds ordered this encounter  Medications   metroNIDAZOLE  (METROGEL ) 0.75 % vaginal gel    Sig: Place 1 Applicatorful vaginally at bedtime. For 5 days.    Dispense:  70 g    Refill:  0    Supervising Provider:   BLAISE ALEENE KIDD [[id number]]   fluconazole  (DIFLUCAN ) 150 MG tablet    Sig: Take 1 tablet (150 mg total) by mouth every three (3) days as needed. May repeat in 3 days if symptoms not resolved    Dispense:  4 tablet    Refill:  0    Supervising Provider:   LAMPTEY, PHILIP O [[id number]]     *If you need refills on other medications prior to your next appointment, please contact your pharmacy*  Follow-Up: Call back or seek an in-person evaluation if the symptoms worsen or if the condition fails to improve as anticipated.  [facility] [phone]  Other  Instructions Return for recollection of urine   If you have been instructed to have an in-person evaluation today at a local Urgent Care facility, please use the link below. It will take you to a list of all of our available [facility], including address, phone number and hours of operation. Please do not delay care.  [facility]  If you or a family member do not have a primary care provider, use the link below to schedule a visit and establish care. When you choose a [facility] primary care physician or advanced practice provider, you gain a long-term partner in health. Find a Primary Care Provider  Learn more about [facility] in-office and virtual care options: [facility] Get Care Now
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
## 2023-08-27 DIAGNOSIS — H5213 Myopia, bilateral: Secondary | ICD-10-CM | POA: Diagnosis not present

## 2023-11-02 ENCOUNTER — Ambulatory Visit
Admission: RE | Admit: 2023-11-02 | Discharge: 2023-11-02 | Disposition: A | Discharge status: Home / Self Care | Source: Ambulatory Visit | Attending: Family Medicine | Admitting: Family Medicine

## 2023-11-02 ENCOUNTER — Ambulatory Visit (INDEPENDENT_AMBULATORY_CARE_PROVIDER_SITE_OTHER)

## 2023-11-02 ENCOUNTER — Ambulatory Visit: Payer: Self-pay | Admitting: Urgent Care

## 2023-11-02 VITALS — BP 103/69 | HR 90 | Temp 99.1°F | Resp 16

## 2023-11-02 DIAGNOSIS — M545 Low back pain, unspecified: Secondary | ICD-10-CM

## 2023-11-02 DIAGNOSIS — S39012A Strain of muscle, fascia and tendon of lower back, initial encounter: Secondary | ICD-10-CM | POA: Diagnosis not present

## 2023-11-02 DIAGNOSIS — M549 Dorsalgia, unspecified: Secondary | ICD-10-CM

## 2023-11-02 MED ORDER — CYCLOBENZAPRINE HCL 5 MG PO TABS: 5.0000 mg | ORAL_TABLET | Freq: Every evening | ORAL | 0 refills | Status: AC | PRN

## 2023-11-02 MED ORDER — NAPROXEN 500 MG PO TABS: 500.0000 mg | ORAL_TABLET | Freq: Two times a day (BID) | ORAL | 0 refills | Status: AC
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
## 2023-11-04 ENCOUNTER — Telehealth: Admitting: Physician Assistant

## 2023-11-04 DIAGNOSIS — N760 Acute vaginitis: Secondary | ICD-10-CM | POA: Diagnosis not present

## 2023-11-04 DIAGNOSIS — B9689 Other specified bacterial agents as the cause of diseases classified elsewhere: Secondary | ICD-10-CM

## 2023-11-04 MED ORDER — METRONIDAZOLE 0.75 % VA GEL: 1.0000 | Freq: Every day | VAGINAL | 0 refills | Status: DC

## 2023-11-04 MED ORDER — FLUCONAZOLE 150 MG PO TABS: ORAL_TABLET | ORAL | 0 refills | Status: DC

## 2023-11-04 NOTE — Patient Instructions (Signed)
  Rachel Lloyd, thank you for joining Rachel Borg, PA-C for today's virtual visit.  While this provider is not your primary care provider (PCP), if your PCP is located in our provider database this encounter information will be shared with them immediately following your visit.   A [name] MyChart account gives you access to today's visit and all your visits, tests, and labs performed at [facility]  click here if you don't have a [name] MyChart account or go to [email or web address]  Consent: (Patient) Rachel Lloyd provided verbal consent for this virtual visit at the beginning of the encounter.  Current Medications:  Current Outpatient Medications:    fluconazole  (DIFLUCAN ) 150 MG tablet, Take one tablet by mouth x 1 day. May repeat in 72 hours if symptoms don't improve., Disp: 2 tablet, Rfl: 0   cyclobenzaprine  (FLEXERIL ) 5 MG tablet, Take 1 tablet (5 mg total) by mouth at bedtime as needed., Disp: 30 tablet, Rfl: 0   metroNIDAZOLE  (METROGEL ) 0.75 % vaginal gel, Place 1 Applicatorful vaginally at bedtime. For 5 days., Disp: 70 g, Rfl: 0   naproxen  (NAPROSYN ) 500 MG tablet, Take 1 tablet (500 mg total) by mouth 2 (two) times daily with a meal., Disp: 30 tablet, Rfl: 0   nystatin  cream (MYCOSTATIN ), Apply to affected area 2 times daily, Disp: 30 g, Rfl: 0   Medications ordered in this encounter:  Meds ordered this encounter  Medications   metroNIDAZOLE  (METROGEL ) 0.75 % vaginal gel    Sig: Place 1 Applicatorful vaginally at bedtime. For 5 days.    Dispense:  70 g    Refill:  0    Supervising Provider:   BLAISE ALEENE KIDD [[id number]]   fluconazole  (DIFLUCAN ) 150 MG tablet    Sig: Take one tablet by mouth x 1 day. May repeat in 72 hours if symptoms don't improve.    Dispense:  2 tablet    Refill:  0    Supervising Provider:   LAMPTEY, PHILIP O [[id number]]     *If you need refills on other medications prior to your next appointment, please contact your  pharmacy*  Follow-Up: Call back or seek an in-person evaluation if the symptoms worsen or if the condition fails to improve as anticipated.  [facility] [phone]  Other Instructions Start medicines as prescribed. Consider using Boric Acid vaginal suppositories for recurring vaginal symptoms. Follow up with GYN  once scheduled for further evaluation. Schedule a virtual appointment or follow up at an urgent care clinic if symptoms don't improve.    If you have been instructed to have an in-person evaluation today at a local Urgent Care facility, please use the link below. It will take you to a list of all of our available [facility], including address, phone number and hours of operation. Please do not delay care.  [facility]  If you or a family member do not have a primary care provider, use the link below to schedule a visit and establish care. When you choose a [facility] primary care physician or advanced practice provider, you gain a long-term partner in health. Find a Primary Care Provider  Learn more about [facility]'s in-office and virtual care options: [facility] - Get Care Now
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
## 2023-12-23 ENCOUNTER — Ambulatory Visit
Admission: RE | Admit: 2023-12-23 | Discharge: 2023-12-23 | Disposition: A | Discharge status: Home / Self Care | Payer: Self-pay | Attending: Internal Medicine

## 2023-12-23 VITALS — BP 115/82 | HR 77 | Temp 98.1°F | Resp 18 | Ht 61.5 in | Wt 130.0 lb

## 2023-12-23 DIAGNOSIS — Z113 Encounter for screening for infections with a predominantly sexual mode of transmission: Secondary | ICD-10-CM | POA: Diagnosis not present

## 2023-12-23 DIAGNOSIS — R350 Frequency of micturition: Secondary | ICD-10-CM | POA: Diagnosis present

## 2023-12-23 DIAGNOSIS — N898 Other specified noninflammatory disorders of vagina: Secondary | ICD-10-CM | POA: Diagnosis present

## 2023-12-23 LAB — POCT URINE DIPSTICK
Bilirubin, UA: NEGATIVE
Glucose, UA: NEGATIVE mg/dL
Ketones, POC UA: NEGATIVE mg/dL
Nitrite, UA: NEGATIVE
POC PROTEIN,UA: NEGATIVE
Spec Grav, UA: >=1.03 — AB (ref 1.010–1.025)
Urobilinogen, UA: 0.2 U/dL
pH, UA: 6.5 (ref 5.0–8.0)

## 2023-12-23 LAB — POCT URINE PREGNANCY: Preg Test, Ur: NEGATIVE

## 2023-12-23 MED ORDER — FLUCONAZOLE 150 MG PO TABS: 150.0000 mg | ORAL_TABLET | Freq: Every day | ORAL | 0 refills | Status: AC

## 2023-12-23 MED ORDER — METRONIDAZOLE 0.75 % VA GEL: 1.0000 | Freq: Every day | VAGINAL | 0 refills | Status: AC

## 2023-12-23 NOTE — Discharge Instructions (Addendum)
 Urine pregnancy test is negative.  Urinalysis is not overly convincing for urinary tract infection however given the symptoms we will send it off for a culture.  This will take 2 to 3 days.  If there is bacteria present then we will contact you and start antibiotics for urinary tract infection.  Results will be on your MyChart otherwise.  We are treating you for bacterial vaginosis.  Please use the metronidazole  gel once daily for 5 days.  Do not drink any alcohol with this medication for 3 days after completing course as it will cause you to vomit.  We have also called in Diflucan  150 mg take 1 tablet at the for signs of yeast infection and repeat in 3 days.  We will contact you if we need to arrange additional treatment based on your testing. Abstain from sex until you receive your final results.  Use a condom for the sexual encounter.  Use hypoallergenic soaps and detergents and wear loosefitting cotton underwear.  If you have any worsening or changing symptoms including abnormal discharge, pelvic pain, abdominal pain, fever, nausea, vomiting you should be reevaluated.

## 2023-12-23 NOTE — ED Triage Notes (Signed)
 Pt c/o thin white vaginal discharge and itching, going to the bathroom more frequently, fishy vaginal odor, abdominal pain for 1-2 days.
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
## 2023-12-24 ENCOUNTER — Ambulatory Visit (HOSPITAL_COMMUNITY): Payer: Self-pay

## 2023-12-24 LAB — CERVICOVAGINAL ANCILLARY ONLY
Bacterial Vaginitis (gardnerella): POSITIVE — AB
Candida Glabrata: NEGATIVE
Candida Vaginitis: POSITIVE — AB
Chlamydia: NEGATIVE
Comment: NEGATIVE
Comment: NEGATIVE
Comment: NEGATIVE
Comment: NEGATIVE
Comment: NEGATIVE
Comment: NORMAL
Neisseria Gonorrhea: NEGATIVE
Trichomonas: NEGATIVE

## 2023-12-24 LAB — URINE CULTURE

## 2024-01-19 ENCOUNTER — Encounter: Payer: Self-pay | Admitting: Family Medicine

## 2024-01-19 ENCOUNTER — Telehealth: Admitting: Family Medicine

## 2024-01-19 DIAGNOSIS — B3731 Acute candidiasis of vulva and vagina: Secondary | ICD-10-CM | POA: Diagnosis not present

## 2024-01-19 MED ORDER — FLUCONAZOLE 150 MG PO TABS: 150.0000 mg | ORAL_TABLET | ORAL | 0 refills | Status: AC

## 2024-01-19 NOTE — Progress Notes (Signed)

## 2024-01-22 ENCOUNTER — Ambulatory Visit: Payer: Self-pay | Admitting: Family Medicine

## 2024-01-22 ENCOUNTER — Encounter: Payer: Self-pay | Admitting: Family Medicine

## 2024-01-22 VITALS — BP 104/76 | HR 87 | Ht 61.5 in | Wt 131.8 lb

## 2024-01-22 DIAGNOSIS — N898 Other specified noninflammatory disorders of vagina: Secondary | ICD-10-CM

## 2024-01-22 LAB — POCT WET PREP (WET MOUNT)
Clue Cells Wet Prep Whiff POC: POSITIVE
Trichomonas Wet Prep HPF POC: ABSENT
WBC, Wet Prep HPF POC: >20

## 2024-01-22 MED ORDER — METRONIDAZOLE 500 MG PO TABS: 500.0000 mg | ORAL_TABLET | Freq: Two times a day (BID) | ORAL | 0 refills | Status: AC

## 2024-01-22 NOTE — Progress Notes (Addendum)
" ° ° °  SUBJECTIVE:   CHIEF COMPLAINT / HPI:   Discussed the use of AI scribe software for clinical note transcription with the patient, who gave verbal consent to proceed.  History of Present Illness Rachel Lloyd is a 39 year old female who presents with vaginal discharge and irritation.  Vaginal discharge - Vaginal discharge present for the past couple of days - Described as white and clear - No associated itching, irritation, pain, fever, or dysuria - No new sexual partners  Recurrent vaginal symptoms - History of recurrent vaginal discharge, particularly around menstrual cycle or after sexual intercourse - Uses pads during menstrual cycle - No sex since LMP - Allergic to latex, but experiences similar symptoms with both latex and non-latex products  Response to prior treatments - Previously used boric acid suppositories and wipes, which resulted in significant itching - No relief from these treatments    PERTINENT  PMH / PSH: recurrent BV  OBJECTIVE:   BP 104/76   Pulse 87   Ht 5' 1.5 (1.562 m)   Wt 131 lb 12.8 oz (59.8 kg)   LMP 11/16/2023 (Approximate)   SpO2 100%   BMI 24.50 kg/m   General: Alert, pleasant woman. NAD. HEENT: NCAT. MMM. CV: RRR, no murmurs.   Resp: CTAB, no wheezing or crackles. Normal WOB on RA.   Ext: Moves all ext spontaneously Skin: Warm, well perfused  GU: Thin, whitish discharge in vaginal vault  Dayshia CMA present as chapoerone  ASSESSMENT/PLAN:   Assessment & Plan Vaginal discharge Wet mount positive for BV. Tx with flagyl  500mg  BID x 7days. Has had recurrent BV infxns, will refer to OBGYN for further management. Not sexually active since LMP. Uses condoms inconsistently for contraception, counseled on other options such as pills or nexplanon, pt will f/u.  - f/u vaginal swab STI test     Twyla Nearing, MD [facility] "

## 2024-01-22 NOTE — Patient Instructions (Signed)
 1) I will check for BV, yeast, and common STIs that can cause discharge. Depending on the results, I will send any medications you need to your pharmacy. - You will get your results on MyChart  2) I am sending a referral to OBGYN to evaluate for your frequent BV infections.

## 2024-01-24 NOTE — Assessment & Plan Note (Signed)
 Wet mount positive for BV. Tx with flagyl  500mg  BID x 7days. Has had recurrent BV infxns, will refer to OBGYN for further management. Not sexually active since LMP. Uses condoms inconsistently for contraception, counseled on other options such as pills or nexplanon, pt will f/u.  - f/u vaginal swab STI test

## 2024-01-26 LAB — CERVICOVAGINAL ANCILLARY ONLY
Chlamydia: NEGATIVE
Comment: NEGATIVE
Comment: NEGATIVE
Comment: NORMAL
Neisseria Gonorrhea: NEGATIVE
Trichomonas: NEGATIVE
